# Patient Record
Sex: Male | Born: 1945 | Race: White | Hispanic: No | Smoking: Former smoker
Health system: Southern US, Community
[De-identification: ages and names within clinical notes are randomized; demographics above are authoritative.]

## PROBLEM LIST (undated history)

## (undated) DIAGNOSIS — N400 Enlarged prostate without lower urinary tract symptoms: Secondary | ICD-10-CM

## (undated) DIAGNOSIS — I209 Angina pectoris, unspecified: Secondary | ICD-10-CM

## (undated) DIAGNOSIS — E119 Type 2 diabetes mellitus without complications: Secondary | ICD-10-CM

## (undated) DIAGNOSIS — K219 Gastro-esophageal reflux disease without esophagitis: Secondary | ICD-10-CM

## (undated) DIAGNOSIS — K589 Irritable bowel syndrome without diarrhea: Secondary | ICD-10-CM

## (undated) DIAGNOSIS — I10 Essential (primary) hypertension: Secondary | ICD-10-CM

## (undated) DIAGNOSIS — I2510 Atherosclerotic heart disease of native coronary artery without angina pectoris: Secondary | ICD-10-CM

## (undated) DIAGNOSIS — G4733 Obstructive sleep apnea (adult) (pediatric): Secondary | ICD-10-CM

## (undated) DIAGNOSIS — R739 Hyperglycemia, unspecified: Secondary | ICD-10-CM

## (undated) DIAGNOSIS — J8410 Pulmonary fibrosis, unspecified: Secondary | ICD-10-CM

## (undated) DIAGNOSIS — F419 Anxiety disorder, unspecified: Secondary | ICD-10-CM

## (undated) DIAGNOSIS — E785 Hyperlipidemia, unspecified: Secondary | ICD-10-CM

## (undated) DIAGNOSIS — N529 Male erectile dysfunction, unspecified: Secondary | ICD-10-CM

## (undated) DIAGNOSIS — M1990 Unspecified osteoarthritis, unspecified site: Secondary | ICD-10-CM

## (undated) DIAGNOSIS — J849 Interstitial pulmonary disease, unspecified: Secondary | ICD-10-CM

## (undated) HISTORY — DX: Irritable bowel syndrome, unspecified: K58.9

## (undated) HISTORY — DX: Gastro-esophageal reflux disease without esophagitis: K21.9

## (undated) HISTORY — DX: Anxiety disorder, unspecified: F41.9

## (undated) HISTORY — DX: Pulmonary fibrosis, unspecified: J84.10

## (undated) HISTORY — DX: Interstitial pulmonary disease, unspecified: J84.9

## (undated) HISTORY — DX: Essential (primary) hypertension: I10

## (undated) HISTORY — PX: TONSILLECTOMY: SUR1361

## (undated) HISTORY — DX: Obstructive sleep apnea (adult) (pediatric): G47.33

## (undated) HISTORY — DX: Atherosclerotic heart disease of native coronary artery without angina pectoris: I25.10

## (undated) HISTORY — DX: Angina pectoris, unspecified: I20.9

## (undated) HISTORY — DX: Unspecified osteoarthritis, unspecified site: M19.90

## (undated) HISTORY — DX: Type 2 diabetes mellitus without complications: E11.9

## (undated) HISTORY — DX: Hyperlipidemia, unspecified: E78.5

## (undated) HISTORY — DX: Benign prostatic hyperplasia without lower urinary tract symptoms: N40.0

## (undated) HISTORY — PX: REPLACEMENT TOTAL KNEE BILATERAL: SUR1225

## (undated) HISTORY — DX: Male erectile dysfunction, unspecified: N52.9

## (undated) HISTORY — PX: COLON SURGERY: SHX602

## (undated) HISTORY — PX: APPENDECTOMY: SHX54

## (undated) HISTORY — DX: Hyperglycemia, unspecified: R73.9

---

## 1999-06-12 ENCOUNTER — Ambulatory Visit (HOSPITAL_COMMUNITY): Admission: RE | Admit: 1999-06-12 | Discharge: 1999-06-12 | Payer: Self-pay | Admitting: Internal Medicine

## 2004-11-22 HISTORY — PX: ILIAC ARTERY ANEURYSM REPAIR: SUR1158

## 2005-03-04 ENCOUNTER — Encounter: Admission: RE | Admit: 2005-03-04 | Discharge: 2005-03-04 | Payer: Self-pay | Admitting: Vascular Surgery

## 2005-03-10 ENCOUNTER — Ambulatory Visit (HOSPITAL_COMMUNITY): Admission: RE | Admit: 2005-03-10 | Discharge: 2005-03-10 | Payer: Self-pay | Admitting: Vascular Surgery

## 2005-03-16 ENCOUNTER — Inpatient Hospital Stay (HOSPITAL_COMMUNITY): Admission: RE | Admit: 2005-03-16 | Discharge: 2005-03-20 | Payer: Self-pay | Admitting: Vascular Surgery

## 2006-11-22 HISTORY — PX: REPLACEMENT TOTAL KNEE: SUR1224

## 2016-05-12 DIAGNOSIS — M5030 Other cervical disc degeneration, unspecified cervical region: Secondary | ICD-10-CM | POA: Insufficient documentation

## 2016-05-12 DIAGNOSIS — E782 Mixed hyperlipidemia: Secondary | ICD-10-CM | POA: Insufficient documentation

## 2016-05-12 DIAGNOSIS — K219 Gastro-esophageal reflux disease without esophagitis: Secondary | ICD-10-CM | POA: Insufficient documentation

## 2016-05-12 DIAGNOSIS — E1151 Type 2 diabetes mellitus with diabetic peripheral angiopathy without gangrene: Secondary | ICD-10-CM | POA: Insufficient documentation

## 2016-05-12 DIAGNOSIS — N433 Hydrocele, unspecified: Secondary | ICD-10-CM | POA: Insufficient documentation

## 2016-05-12 DIAGNOSIS — E1159 Type 2 diabetes mellitus with other circulatory complications: Secondary | ICD-10-CM | POA: Insufficient documentation

## 2016-05-12 DIAGNOSIS — E66811 Obesity, class 1: Secondary | ICD-10-CM | POA: Insufficient documentation

## 2016-05-12 DIAGNOSIS — K449 Diaphragmatic hernia without obstruction or gangrene: Secondary | ICD-10-CM | POA: Insufficient documentation

## 2016-05-12 DIAGNOSIS — M179 Osteoarthritis of knee, unspecified: Secondary | ICD-10-CM | POA: Insufficient documentation

## 2016-05-12 DIAGNOSIS — E119 Type 2 diabetes mellitus without complications: Secondary | ICD-10-CM | POA: Insufficient documentation

## 2016-05-12 DIAGNOSIS — I10 Essential (primary) hypertension: Secondary | ICD-10-CM | POA: Insufficient documentation

## 2016-05-12 DIAGNOSIS — E118 Type 2 diabetes mellitus with unspecified complications: Secondary | ICD-10-CM | POA: Insufficient documentation

## 2016-11-17 DIAGNOSIS — K580 Irritable bowel syndrome with diarrhea: Secondary | ICD-10-CM | POA: Insufficient documentation

## 2016-11-17 DIAGNOSIS — K130 Diseases of lips: Secondary | ICD-10-CM | POA: Insufficient documentation

## 2016-11-22 HISTORY — PX: REPLACEMENT TOTAL KNEE: SUR1224

## 2017-06-06 DIAGNOSIS — Z96652 Presence of left artificial knee joint: Secondary | ICD-10-CM | POA: Insufficient documentation

## 2017-10-17 DIAGNOSIS — N401 Enlarged prostate with lower urinary tract symptoms: Secondary | ICD-10-CM | POA: Insufficient documentation

## 2018-05-29 DIAGNOSIS — I739 Peripheral vascular disease, unspecified: Secondary | ICD-10-CM | POA: Insufficient documentation

## 2019-06-21 ENCOUNTER — Telehealth: Payer: Self-pay | Admitting: Cardiovascular Disease

## 2019-06-21 NOTE — Telephone Encounter (Signed)
The patient was referred to [place] by Dr. Bea Graff of [facility]. Dr. Willette Pa office was supposed to be faxing over a referral as well as the patient's charts. The patient has not heard anything about scheduling his first appointment with Dr. Sallyanne Kuster. He just wants to make sure his other doctor's office sent over the paperwork to [place].

## 2019-06-26 ENCOUNTER — Telehealth: Payer: Self-pay | Admitting: Cardiovascular Disease

## 2019-06-26 NOTE — Telephone Encounter (Signed)
LVM for patient to schedule a new patient appointment with Dr. Sallyanne Kuster on 06-29-19 at 2 p.m.

## 2019-06-29 ENCOUNTER — Other Ambulatory Visit: Payer: Self-pay

## 2019-06-29 ENCOUNTER — Ambulatory Visit (INDEPENDENT_AMBULATORY_CARE_PROVIDER_SITE_OTHER): Payer: Medicare Other | Admitting: Cardiovascular Disease

## 2019-06-29 ENCOUNTER — Encounter: Payer: Self-pay | Admitting: Cardiovascular Disease

## 2019-06-29 VITALS — BP 155/85 | HR 69 | Ht 70.0 in | Wt 212.0 lb

## 2019-06-29 DIAGNOSIS — I208 Other forms of angina pectoris: Secondary | ICD-10-CM | POA: Diagnosis not present

## 2019-06-29 DIAGNOSIS — I739 Peripheral vascular disease, unspecified: Secondary | ICD-10-CM

## 2019-06-29 DIAGNOSIS — I10 Essential (primary) hypertension: Secondary | ICD-10-CM

## 2019-06-29 DIAGNOSIS — E1151 Type 2 diabetes mellitus with diabetic peripheral angiopathy without gangrene: Secondary | ICD-10-CM | POA: Diagnosis not present

## 2019-06-29 DIAGNOSIS — E7800 Pure hypercholesterolemia, unspecified: Secondary | ICD-10-CM | POA: Diagnosis not present

## 2019-06-29 MED ORDER — ATENOLOL 50 MG PO TABS: 75.0000 mg | ORAL_TABLET | Freq: Every day | ORAL | 4 refills | Status: DC

## 2019-06-29 MED ORDER — SIMVASTATIN 40 MG PO TABS: 40.0000 mg | ORAL_TABLET | Freq: Every day | ORAL | 4 refills | Status: DC

## 2019-06-29 NOTE — Patient Instructions (Addendum)
Medication Instructions:  INCREASE the Atenolol 75 mg once daily INCREASE the Simvastatin to 40 mg once daily  If you need a refill on your cardiac medications before your next appointment, please call your pharmacy.   Lab work: Your provider would like for you to return one week before the schedule Cardiac CT to have the following labs drawn: BMET. You do not need an appointment for the lab. Once in our office lobby there is a podium where you can sign in and ring the doorbell to alert [place] that you are here. The lab is open from 8:00 am to 4:30 pm; closed for lunch from 12:45pm-1:45pm.  If you have labs (blood work) drawn today and your tests are completely normal, you will receive your results only by: Marland Kitchen MyChart Message (if you have MyChart) OR . A paper copy in the mail If you have any lab test that is abnormal or we need to change your treatment, we will call you to review the results.  Testing/Procedures: Your physician has requested that you have cardiac CT. Cardiac computed tomography (CT) is a painless test that uses an x-ray machine to take clear, detailed pictures of your heart. For further information please visit HugeFiesta.tn. Please follow instruction sheet as given.  Follow-Up: . Follow up after the cardiac ct  Any Other Special Instructions Will Be Listed Below (If Applicable). Your cardiac CT will be scheduled at one of the below locations:   [facility] [address] [place], [place] [address] (336) [place] [address] [place], [place] [address] [phone]  Please arrive at the [facility] main entrance of [facility] 30-45 minutes prior to test start time. Proceed to the [facility] Radiology Department (first floor) to check-in and test prep.  Please follow these instructions carefully (unless otherwise directed):  Hold all erectile dysfunction medications at least  48 hours prior to test.  On the Night Before the Test: . Be sure to Drink plenty of water. . Do not consume any caffeinated/decaffeinated beverages or chocolate 12 hours prior to your test. . Do not take any antihistamines 12 hours prior to your test.  On the Day of the Test: . Drink plenty of water. Do not drink any water within one hour of the test. . Do not eat any food 4 hours prior to the test. . You may take your regular medications prior to the test.  . Take your Atenolol two hours prior to test. . HOLD Hydrochlorothiazide morning of the test.        After the Test: . Drink plenty of water. . After receiving IV contrast, you may experience a mild flushed feeling. This is normal. . On occasion, you may experience a mild rash up to 24 hours after the test. This is not dangerous. If this occurs, you can take Benadryl 25 mg and increase your fluid intake. . If you experience trouble breathing, this can be serious. If it is severe call 911 IMMEDIATELY. If it is mild, please call our office.     Please contact the cardiac imaging nurse navigator should you have any questions/concerns Marchia Bond, RN Navigator Cardiac Imaging [place] and Vascular Services [phone] Office  [phone] Cell

## 2019-06-29 NOTE — Progress Notes (Signed)
Cardiology Consultation Note:    Date:  06/30/2019   ID:  Vincent Meyers, DOB Jul 04, 1946, MRN [id number]  PCP:  Vincent Mina., MD  Cardiologist:  No primary care provider on file.  Electrophysiologist:  None   Referring MD: Vincent Mina., MD   Chief Complaint  Patient presents with  . Chest Pain  Vincent Meyers is a 73 y.o. male who is being seen today for the evaluation of exertional chest pain at the request of Vincent Mina., MD.   History of Present Illness:    Vincent Meyers is a 73 y.o. male with a hx of PAD, DM, HLP, HTN who has developed problems with exertional chest pain gradually over the last year.    He brought this to the attention of his primary care provider, Vincent Meyers after he experienced recurrent problems of central chest tightness during sexual intercourse.  The symptoms are promptly resolved with rest and then return with resuming activity.  Looking back, he acknowledges that he has noticed some similar chest discomfort whenever he would try to walk uphill, as far back as the last year or so.  For some time, he thought this was due to a hiatal hernia, since he also has a similar chest tightness when he lies flat in bed at night, with mild symptoms that spontaneously resolve after about an hour.  He denies problems with dyspnea, palpitations, dizziness, syncope, intermittent claudication, but does have erectile dysfunction and has used PDE 5 inhibitors at times.  He was not using any erectile dysfunction medications when he has episodes of chest pain.  He has never taken nitroglycerin.  He does not have any history of focal neurological events.  She has been taking a beta-blocker for years for high blood pressure, but recently his blood pressure has been a little high.  He has a longstanding history of problems with PAD, although the history is not very clear.  He says he initially had some type of abdominal surgery that required removal of a segment of intestine and  that an abnormality with 1 of his pelvic arteries was detected at that time.  He subsequently underwent a procedure by Dr. Donnetta Hutching (stent?).  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  And had a repeat procedure, possibly a surgical bypass.  I cannot find any records other than a CT from 2006 that shows a thrombosed left external iliac artery.  "In comparison with [facility] pelvic CT 02/26/05 there is no interval change in aneurysmal dilatation of the left common iliac artery measuring 6.2cm AP x 5.2cm wide (image 65) with likely anterolateral extensive thrombosis and enhancing central patency posteriorly.  The left external iliac artery shows thrombosis.  The left common femoral artery is patent constituted by collateral vessels." [03/04/2005]  Past Medical History:  Diagnosis Date  . Diabetes mellitus without complication ([place])   . Erectile dysfunction   . Hyperlipidemia   . Hypertension     Past Surgical History:  Procedure Laterality Date  . ILIAC ARTERY ANEURYSM REPAIR Left 2006   Dr. Donnetta Hutching (redo surgery)  . REPLACEMENT TOTAL KNEE BILATERAL      Current Medications: Current Meds  Medication Sig  . Flaxseed, Linseed, (FLAXSEED OIL PO) Take by mouth.  . hydrochlorothiazide (HYDRODIURIL) 25 MG tablet TAKE 1 TABLET BY MOUTH DAILY  . omeprazole (PRILOSEC) 20 MG capsule Take by mouth.  Marland Kitchen  Probiotic Product (PROBIOTIC-10 PO) Take by mouth.  . sildenafil (REVATIO) 20 MG tablet Use up to 5 tablets one hour prior to sex.  Maximum one dosing per 24 hours.  . simvastatin (ZOCOR) 40 MG tablet Take 1 tablet (40 mg total) by mouth daily at 6 PM.  . tadalafil (CIALIS) 10 MG tablet Take 10 mg by mouth daily as needed for erectile dysfunction.  . Turmeric (QC TUMERIC COMPLEX PO) Take by mouth.  . [DISCONTINUED] simvastatin (ZOCOR) 20 MG tablet TK 1 T PO Q NIGHT     Allergies:   Patient  has no allergy information on record.   Social History   Socioeconomic History  . Marital status: Married    Spouse name: Not on file  . Number of children: Not on file  . Years of education: Not on file  . Highest education level: Not on file  Occupational History  . Not on file  Social Needs  . Financial resource strain: Not on file  . Food insecurity    Worry: Not on file    Inability: Not on file  . Transportation needs    Medical: Not on file    Non-medical: Not on file  Tobacco Use  . Smoking status: Former Research scientist (life sciences)  . Smokeless tobacco: Never Used  Substance and Sexual Activity  . Alcohol use: Yes    Alcohol/week: 2.0 standard drinks    Types: 2 Standard drinks or equivalent per week  . Drug use: Not on file  . Sexual activity: Not on file  Lifestyle  . Physical activity    Days per week: Not on file    Minutes per session: Not on file  . Stress: Not on file  Relationships  . Social Herbalist on phone: Not on file    Gets together: Not on file    Attends religious service: Not on file    Active member of club or organization: Not on file    Attends meetings of clubs or organizations: Not on file    Relationship status: Not on file  Other Topics Concern  . Not on file  Social History Narrative  . Not on file     Family History: The patient's family history includes Alzheimer's disease in his father; Heart attack in his father; Parkinson's disease in an other family member; Stroke in his father and another family member.  ROS:   Please see the history of present illness.     All other systems reviewed and are negative.  EKGs/Labs/Other Studies Reviewed:    The following studies were reviewed today: Notes from Vincent Meyers. CT of the abdomen and pelvis from 2006.  EKG:  EKG is ordered today.  The ekg ordered today demonstrates normal sinus rhythm, normal tracing  Recent Labs: May 31, 2019 Potassium 4.2, glucose 125, creatinine 0.6, normal  liver function tests, hemoglobin 15.9, platelets 207K, hemoglobin A1c 6.2% Recent Lipid Panel May 31, 2019 Total cholesterol 118, triglycerides 117, HDL 31, direct LDL 78  Physical Exam:    VS:  BP (!) 155/85   Pulse 69   Ht 5\' 10"  (1.778 m)   Wt 212 lb (96.2 kg)   SpO2 96%   BMI 30.42 kg/m     Wt Readings from Last 3 Encounters:  06/29/19 212 lb (96.2 kg)     GEN:  Well nourished, well developed in no acute distress HEENT: Normal NECK: No JVD; No carotid bruits LYMPHATICS: No lymphadenopathy CARDIAC: RRR, no murmurs,  rubs, gallops.  Normal radial and ulnar pulses, 1+ left pedal pulses, normal right pedal pulses RESPIRATORY:  Clear to auscultation without rales, wheezing or rhonchi  ABDOMEN: Soft, non-tender, non-distended MUSCULOSKELETAL:  No edema; No deformity  SKIN: Warm and dry NEUROLOGIC:  Alert and oriented x 3 PSYCHIATRIC:  Normal affect   ASSESSMENT:    1. Stable angina (Vincent Meyers)   2. Hypercholesterolemia   3. Diabetes mellitus with peripheral vascular disease (Vincent Meyers)   4. Essential hypertension   5. PAD (peripheral artery disease) ([facility])    PLAN:    In order of problems listed above:  1. CAD: He gives a textbook description of exertional angina pectoris.  We discussed the difference been stable and unstable coronary syndromes, when he should seek emergency attention.  Increase the atenolol to 75 mg once daily.  Schedule him for a coronary CT angiogram (do not take ACE inhibitor or thiazide diuretic on the day of the study).  Continue aspirin and statin. 2.  HLP: On the current dose of simvastatin his lipid profile is almost in target range (<70).  We will increase the simvastatin to 40 mg daily. 3. DM: Well-controlled with diet only. 4. HTN: Increase atenolol to 75 mg daily.  Consider adding a calcium channel blocker for its antianginal effects as well. 5.  PAD: The history of his peripheral vascular problems is not clear to me.  They seem to be quiescent since 2006.   I do not think he has had any follow-up since then.  If he does need a coronary angiogram we would plan to do this from a radial approach, but knowing the anatomy of his lower extremity arterial system might be important for future planning.   Medication Adjustments/Labs and Tests Ordered: Current medicines are reviewed at length with the patient today.  Concerns regarding medicines are outlined above.  Orders Placed This Encounter  Procedures  . CT CORONARY MORPH W/CTA COR W/SCORE W/CA W/CM &/OR WO/CM  . CT CORONARY FRACTIONAL FLOW RESERVE DATA PREP  . CT CORONARY FRACTIONAL FLOW RESERVE FLUID ANALYSIS  . Basic metabolic panel  . EKG 12-Lead   Meds ordered this encounter  Medications  . atenolol (TENORMIN) 50 MG tablet    Sig: Take 1.5 tablets (75 mg total) by mouth daily.    Dispense:  45 tablet    Refill:  4  . simvastatin (ZOCOR) 40 MG tablet    Sig: Take 1 tablet (40 mg total) by mouth daily at 6 PM.    Dispense:  30 tablet    Refill:  4    Patient Instructions  Medication Instructions:  INCREASE the Atenolol 75 mg once daily INCREASE the Simvastatin to 40 mg once daily  If you need a refill on your cardiac medications before your next appointment, please call your pharmacy.   Lab work: Your provider would like for you to return one week before the schedule Cardiac CT to have the following labs drawn: BMET. You do not need an appointment for the lab. Once in our office lobby there is a podium where you can sign in and ring the doorbell to alert [place] that you are here. The lab is open from 8:00 am to 4:30 pm; closed for lunch from 12:45pm-1:45pm.  If you have labs (blood work) drawn today and your tests are completely normal, you will receive your results only by: Marland Kitchen MyChart Message (if you have MyChart) OR . A paper copy in the mail If you have any lab test  that is abnormal or we need to change your treatment, we will call you to review the results.  Testing/Procedures:  Your physician has requested that you have cardiac CT. Cardiac computed tomography (CT) is a painless test that uses an x-ray machine to take clear, detailed pictures of your heart. For further information please visit HugeFiesta.tn. Please follow instruction sheet as given.  Follow-Up: . Follow up after the cardiac ct  Any Other Special Instructions Will Be Listed Below (If Applicable). Your cardiac CT will be scheduled at one of the below locations:   [facility] [address] [place], [place] [address] (336) Vincent [address] [place], [place] [address] [phone]  Please arrive at the [facility] main entrance of [facility] 30-45 minutes prior to test start time. Proceed to the [facility] Radiology Department (first floor) to check-in and test prep.  Please follow these instructions carefully (unless otherwise directed):  Hold all erectile dysfunction medications at least 48 hours prior to test.  On the Night Before the Test: . Be sure to Drink plenty of water. . Do not consume any caffeinated/decaffeinated beverages or chocolate 12 hours prior to your test. . Do not take any antihistamines 12 hours prior to your test.  On the Day of the Test: . Drink plenty of water. Do not drink any water within one hour of the test. . Do not eat any food 4 hours prior to the test. . You may take your regular medications prior to the test.  . Take your Atenolol two hours prior to test. . HOLD Hydrochlorothiazide morning of the test.        After the Test: . Drink plenty of water. . After receiving IV contrast, you may experience a mild flushed feeling. This is normal. . On occasion, you may experience a mild rash up to 24 hours after the test. This is not dangerous. If this occurs, you can take Benadryl 25 mg and increase your fluid intake. . If you experience trouble breathing, this  can be serious. If it is severe call 911 IMMEDIATELY. If it is mild, please call our office.     Please contact the cardiac imaging nurse navigator should you have any questions/concerns Marchia Bond, RN Navigator Cardiac [place] and Vascular Services [phone] Office  [phone] Cell      Signed, Sanda Klein, MD  06/30/2019 4:33 PM    [place]

## 2019-06-30 ENCOUNTER — Encounter: Payer: Self-pay | Admitting: Cardiovascular Disease

## 2019-07-16 LAB — BASIC METABOLIC PANEL
BUN/Creatinine Ratio: 16 (ref 10–24)
BUN: 10 mg/dL (ref 8–27)
CO2: 23 mmol/L (ref 20–29)
Calcium: 9 mg/dL (ref 8.6–10.2)
Chloride: 97 mmol/L (ref 96–106)
Creatinine, Ser: 0.62 mg/dL — ABNORMAL LOW (ref 0.76–1.27)
GFR calc Af Amer: 115 mL/min/{1.73_m2} (ref >59)
GFR calc non Af Amer: 100 mL/min/{1.73_m2} (ref >59)
Glucose: 190 mg/dL — ABNORMAL HIGH (ref 65–99)
Potassium: 4.7 mmol/L (ref 3.5–5.2)
Sodium: 137 mmol/L (ref 134–144)

## 2019-07-23 ENCOUNTER — Telehealth (HOSPITAL_COMMUNITY): Payer: Self-pay | Admitting: Emergency Medicine

## 2019-07-23 NOTE — Telephone Encounter (Signed)
Left message on voicemail with name and callback number Cashawn Yanko RN Navigator Cardiac Imaging [place] [facility] [phone] Office [phone] Cell  

## 2019-07-24 ENCOUNTER — Ambulatory Visit (HOSPITAL_COMMUNITY)
Admission: RE | Admit: 2019-07-24 | Discharge: 2019-07-24 | Disposition: A | Discharge status: Home / Self Care | Payer: Medicare Other | Source: Ambulatory Visit | Attending: Cardiovascular Disease | Admitting: Cardiovascular Disease

## 2019-07-24 ENCOUNTER — Other Ambulatory Visit: Payer: Self-pay

## 2019-07-24 DIAGNOSIS — I208 Other forms of angina pectoris: Secondary | ICD-10-CM

## 2019-07-24 MED ORDER — IOHEXOL 350 MG/ML SOLN
80.0000 mL | Freq: Once | INTRAVENOUS | Status: AC | PRN
  Administered 2019-07-24: 10:00:00 80 mL via INTRAVENOUS

## 2019-07-24 MED ORDER — NITROGLYCERIN 0.4 MG SL SUBL
SUBLINGUAL_TABLET | SUBLINGUAL | Status: AC
  Filled 2019-07-24: qty 2

## 2019-07-24 MED ORDER — METOPROLOL TARTRATE 5 MG/5ML IV SOLN
5.0000 mg | INTRAVENOUS | Status: DC | PRN
  Administered 2019-07-24 (×2): 5 mg via INTRAVENOUS
  Filled 2019-07-24 (×2): qty 5

## 2019-07-24 MED ORDER — NITROGLYCERIN 0.4 MG SL SUBL
0.8000 mg | SUBLINGUAL_TABLET | Freq: Once | SUBLINGUAL | Status: AC
  Administered 2019-07-24: 10:00:00 0.8 mg via SUBLINGUAL
  Filled 2019-07-24: qty 25

## 2019-07-24 MED ORDER — METOPROLOL TARTRATE 5 MG/5ML IV SOLN
INTRAVENOUS | Status: AC
  Filled 2019-07-24: qty 15

## 2019-07-25 DIAGNOSIS — I2510 Atherosclerotic heart disease of native coronary artery without angina pectoris: Secondary | ICD-10-CM | POA: Diagnosis not present

## 2019-07-26 ENCOUNTER — Telehealth: Payer: Self-pay | Admitting: Cardiovascular Disease

## 2019-07-26 ENCOUNTER — Encounter: Payer: Self-pay | Admitting: *Deleted

## 2019-07-26 NOTE — Telephone Encounter (Signed)
New message   Patient states that he is returning call for CT results. Please call.

## 2019-07-26 NOTE — Telephone Encounter (Signed)
Patient made aware of results and verbalized understanding.  His blood pressure on Monday was 130/76. He stated that it usually runs in the [id number] systolic.  The patient would like to know if he can resume strenuous activities. He had been previously advised to stop any strenuous activities.    Notes recorded by Sanda Klein, MD on 07/25/2019 at 5:15 PM EDT  The bad news on the CT of the coronaries is that there is very widespread atherosclerosis (more plaque than 97% of similar age and gender patients).  The good news is that there is only a couple of spots where this plaque is causing significant obstruction and this is limited to less important, secondary branches.  The only really severe blockage is and was called a diagonal branch, the secondary branch of the LAD artery that feeds the front while the heart. This does not feed a particularly large area of heart muscle and it could be hard to fix it with angioplasty and stenting due to the location of the blockage.  Ideally, will be able to control the angina symptoms with further adjustments in medication. I would only recommend angiography if we are unsuccessful with that approach.  Is he doing any better since we increased his atenolol?  Most importantly, it is critical that we prevent progression of the plaques in the other areas of his coronaries by aggressively lowering the cholesterol (we just increased his statin).

## 2019-07-26 NOTE — Telephone Encounter (Signed)
Left a message for the patient to call back.  

## 2019-07-26 NOTE — Telephone Encounter (Signed)
Has he felt better after we increased the atenolol? If so , yes, he can gradually increase activity back to previous baseline. Please make sure we made a f/u appt, preferably 3-4 months

## 2019-07-26 NOTE — Telephone Encounter (Signed)
The patient stated that he has felt better with the Atenolol.  He would like to know if it is safe to take the Cialis or Revatio. He has a prescription for both but knows he cannot take both. He would also like to know which one is better for him to take.

## 2019-07-26 NOTE — Telephone Encounter (Signed)
Revatio (sildenafil) lasts 8 hours. Cialis last 24 hours. Other than that they are pretty much the same as far as safety and side effects. Obviously, if side effects do occur, they will resolve quicker with sildenafil. For both medications it is critically important to avoid mixing them with nitroglycerin-type medications (would not recommend NTG within 24h of most recent dose of sildenafil or within 3 days of Cialis)

## 2019-07-26 NOTE — Telephone Encounter (Signed)
Please advise thank you

## 2019-07-26 NOTE — Telephone Encounter (Signed)
Message sent to MyChart with Dr. Victorino December reply, per the patient's request.

## 2019-11-19 ENCOUNTER — Other Ambulatory Visit: Payer: Self-pay

## 2019-11-19 MED ORDER — SIMVASTATIN 40 MG PO TABS: 40.0000 mg | ORAL_TABLET | Freq: Every day | ORAL | 0 refills | Status: DC

## 2020-01-06 ENCOUNTER — Ambulatory Visit: Payer: Medicare Other | Attending: Internal Medicine

## 2020-01-06 DIAGNOSIS — Z23 Encounter for immunization: Secondary | ICD-10-CM

## 2020-01-06 NOTE — Progress Notes (Signed)
   Covid-19 Vaccination Clinic  Name:  Vincent Meyers    MRN: [id number] DOB: 1946-03-20  01/06/2020  [name] was observed post Covid-19 immunization for 15 minutes without incidence. He was provided with Vaccine Information Sheet and instruction to access the V-Safe system.   [name] was instructed to call 911 with any severe reactions post vaccine: Marland Kitchen Difficulty breathing  . Swelling of your face and throat  . A fast heartbeat  . A bad rash all over your body  . Dizziness and weakness    Immunizations Administered    Name Date Dose VIS Date Route   Pfizer COVID-19 Vaccine 01/06/2020  2:46 PM 0.3 mL 11/02/2019 Intramuscular   Manufacturer: [place]   Lot: [id number]   [place]: [id number]

## 2020-01-28 ENCOUNTER — Other Ambulatory Visit: Payer: Self-pay

## 2020-01-29 ENCOUNTER — Ambulatory Visit: Payer: Medicare Other | Attending: Internal Medicine

## 2020-01-29 DIAGNOSIS — Z23 Encounter for immunization: Secondary | ICD-10-CM

## 2020-01-29 NOTE — Progress Notes (Signed)
   Covid-19 Vaccination Clinic  Name:  Vincent Meyers    MRN: [id number] DOB: 1946/06/13  01/29/2020  [name] was observed post Covid-19 immunization for 15 minutes without incident. He was provided with Vaccine Information Sheet and instruction to access the V-Safe system.   [name] was instructed to call 911 with any severe reactions post vaccine: Marland Kitchen Difficulty breathing  . Swelling of face and throat  . A fast heartbeat  . A bad rash all over body  . Dizziness and weakness   Immunizations Administered    Name Date Dose VIS Date Route   Pfizer COVID-19 Vaccine 01/29/2020  9:34 AM 0.3 mL 11/02/2019 Intramuscular   Manufacturer: [place]   Lot: [id number]   [place]: [id number]

## 2020-01-30 ENCOUNTER — Ambulatory Visit: Payer: Medicare Other

## 2020-01-31 ENCOUNTER — Other Ambulatory Visit: Payer: Self-pay

## 2020-01-31 MED ORDER — SIMVASTATIN 40 MG PO TABS: 40.0000 mg | ORAL_TABLET | Freq: Every day | ORAL | 0 refills | Status: DC

## 2020-05-05 ENCOUNTER — Other Ambulatory Visit: Payer: Self-pay | Admitting: *Deleted

## 2020-05-07 MED ORDER — SIMVASTATIN 40 MG PO TABS: 40.0000 mg | ORAL_TABLET | Freq: Every day | ORAL | 0 refills | Status: DC

## 2020-09-05 ENCOUNTER — Encounter (HOSPITAL_COMMUNITY): Payer: Self-pay

## 2020-09-05 ENCOUNTER — Ambulatory Visit (HOSPITAL_COMMUNITY)
Admission: EM | Admit: 2020-09-05 | Discharge: 2020-09-05 | Disposition: A | Discharge status: Home / Self Care | Payer: Medicare Other | Attending: Family Medicine | Admitting: Family Medicine

## 2020-09-05 ENCOUNTER — Other Ambulatory Visit: Payer: Self-pay

## 2020-09-05 DIAGNOSIS — I10 Essential (primary) hypertension: Secondary | ICD-10-CM

## 2020-09-05 MED ORDER — CLONIDINE HCL 0.1 MG PO TABS
ORAL_TABLET | ORAL | Status: AC
  Filled 2020-09-05: qty 1

## 2020-09-05 MED ORDER — CLONIDINE HCL 0.1 MG PO TABS
0.1000 mg | ORAL_TABLET | ORAL | Status: AC
  Administered 2020-09-05: 0.1 mg via ORAL

## 2020-09-05 MED ORDER — CLONIDINE HCL 0.1 MG PO TABS: 0.1000 mg | ORAL_TABLET | Freq: Two times a day (BID) | ORAL | 0 refills | Status: DC

## 2020-09-05 NOTE — ED Provider Notes (Signed)
[facility]    CSN: [id number] Arrival date & time: 09/05/20  1008      History   Chief Complaint Chief Complaint  Patient presents with  . Hypertension    this am 203/105    HPI Vincent Meyers is a 74 y.o. male.   HPI  Pleasant 73 year old gentleman.  He is here for hypertension.  He noticed that his blood pressure was very high when he checked it this morning.  It was 203/105 at home.  He checks his blood pressure periodically.  He states it has actually been higher than it should for the last couple of months.  He saw his physician who thought it might be due to stress.  They agreed to recheck it again when his stress from work and hobbies was lower. Patient was in [place] last week.  While he was in [place] he developed some symptoms and his blood pressure taken.  It was quite high.  He was admitted to the hospital.  He stayed overnight.  He states that he had blood work, EKG, chest x-ray, and some sort of chest dye study.  Everything was fine.  They gave him nitro to bring down his blood pressure.  He was discharged with no change in his overall medications. Patient states that he has fleeting head pains.  They last for just an instant.  Its in his forehead.  He states this happens periodically.  He cannot predict what will bring it on or improve the pain.  No nausea or vomiting.  No difficulty with vision or visual acuity.  No trouble with dexterity, or balance.  No numbness or weakness in arms or legs. Patient states that he has a family history of strokes.  He states that he worries a lot about having a stroke. Patient takes Tenormin 75 mg a day.  Also takes hydrochlorothiazide 25 mg a day, and lisinopril 40 mg a day.  He has been taking all of his medications regularly.   Past Medical History:  Diagnosis Date  . Diabetes mellitus without complication ([place])   . Erectile dysfunction   . Hyperlipidemia   . Hypertension     There are no problems to display  for this patient.   Past Surgical History:  Procedure Laterality Date  . ILIAC ARTERY ANEURYSM REPAIR Left 2006   Dr. Donnetta Hutching (redo surgery)  . REPLACEMENT TOTAL KNEE BILATERAL         Home Medications    Prior to Admission medications   Medication Sig Start Date End Date Taking? Authorizing Provider  aspirin EC 81 MG tablet Take by mouth.   Yes [provider]  atenolol (TENORMIN) 50 MG tablet Take 1.5 tablets (75 mg total) by mouth daily. 06/29/19  Yes Croitoru, Mihai, MD  Flaxseed, Linseed, (FLAXSEED OIL PO) Take by mouth.   Yes [provider]  hydrochlorothiazide (HYDRODIURIL) 25 MG tablet TAKE 1 TABLET BY MOUTH DAILY 11/11/16  Yes [provider]  lisinopril (ZESTRIL) 40 MG tablet TK 1 T PO D 05/28/19  Yes [provider]  Magnesium Gluconate 550 MG TABS Take by mouth.   Yes [provider]  Omega-3 1000 MG CAPS Take by mouth.   Yes [provider]  omeprazole (PRILOSEC) 20 MG capsule Take by mouth. 05/29/18  Yes [provider]  Probiotic Product (PROBIOTIC-10 PO) Take by mouth.   Yes [provider]  sildenafil (REVATIO) 20 MG tablet Use up to 5 tablets one hour prior to  sex.  Maximum one dosing per 24 hours. 05/31/19  Yes [provider]  simvastatin (ZOCOR) 40 MG tablet Take 1 tablet (40 mg total) by mouth daily at 6 PM. 05/07/20  Yes Croitoru, Mihai, MD  tadalafil (CIALIS) 10 MG tablet Take 10 mg by mouth daily as needed for erectile dysfunction.   Yes [provider]  Turmeric (QC TUMERIC COMPLEX PO) Take by mouth.   Yes [provider]  cloNIDine (CATAPRES) 0.1 MG tablet Take 1 tablet (0.1 mg total) by mouth 2 (two) times daily. 09/05/20   Raylene Everts, MD  Loratadine 10 MG CAPS Take by mouth.    [provider]    Family History Family History  Problem Relation Age of Onset  . Heart attack Father   . Stroke Father   . Alzheimer's disease Father   . Stroke Other    . Parkinson's disease Other     Social History Social History   Tobacco Use  . Smoking status: Former Research scientist (life sciences)  . Smokeless tobacco: Never Used  Vaping Use  . Vaping Use: Never used  Substance Use Topics  . Alcohol use: Yes    Alcohol/week: 2.0 standard drinks    Types: 2 Standard drinks or equivalent per week  . Drug use: Never     Allergies   Patient has no known allergies.   Review of Systems Review of Systems See HPI  Physical Exam Triage Vital Signs ED Triage Vitals  Enc Vitals Group     BP 09/05/20 1155 (!) 195/93     Pulse Rate 09/05/20 1155 69     Resp 09/05/20 1155 18     Temp 09/05/20 1155 98.2 F (36.8 C)     Temp Source 09/05/20 1155 Oral     SpO2 09/05/20 1155 98 %     Weight --      Height --      Head Circumference --      Peak Flow --      Pain Score 09/05/20 1158 0     Pain Loc --      Pain Edu? --      Excl. in [place]? --    No data found.  Updated Vital Signs BP (!) 196/78   Pulse 69   Temp 98.2 F (36.8 C) (Oral)   Resp 18   SpO2 98%      Physical Exam Constitutional:      General: He is not in acute distress.    Appearance: He is well-developed.     Comments: Rounded abdomen.  Mildly anxious.  HENT:     Head: Normocephalic and atraumatic.     Right Ear: Tympanic membrane and ear canal normal.     Left Ear: Tympanic membrane and ear canal normal.     Nose: Nose normal.     Mouth/Throat:     Mouth: Mucous membranes are moist.     Pharynx: No posterior oropharyngeal erythema.  Eyes:     Conjunctiva/sclera: Conjunctivae normal.     Pupils: Pupils are equal, round, and reactive to light.     Comments: Fundi normal or as expected  Cardiovascular:     Rate and Rhythm: Normal rate and regular rhythm.     Heart sounds: Normal heart sounds.  Pulmonary:     Effort: Pulmonary effort is normal. No respiratory distress.     Breath sounds: Normal breath sounds.     Comments: Heart and lung exam is normal Abdominal:  General:  There is no distension.     Palpations: Abdomen is soft.  Musculoskeletal:        General: Normal range of motion.     Cervical back: Normal range of motion.     Right lower leg: No edema.     Left lower leg: No edema.  Skin:    General: Skin is warm and dry.  Neurological:     Mental Status: He is alert.  Psychiatric:        Mood and Affect: Mood normal.        Behavior: Behavior normal.      UC Treatments / Results  Labs (all labs ordered are listed, but only abnormal results are displayed) Labs Reviewed - No data to display  EKG   Radiology No results found.  Procedures Procedures (including critical care time)  Medications Ordered in UC Medications  cloNIDine (CATAPRES) tablet 0.1 mg (0.1 mg Oral Given 09/05/20 1255)    Initial Impression / Assessment and Plan / UC Course  I have reviewed the triage vital signs and the nursing notes.  Pertinent labs & imaging results that were available during my care of the patient were reviewed by me and considered in my medical decision making (see chart for details).     I spoke with the patient about his hypertension.  He has elevated blood pressure but at this time it is not dangerous.  It is 188/94 by me.  I told him that this is not enough, in and of itself, to cause a stroke.  He is anxious about elevated blood pressure because of his family history.  I considered adding a long-acting nitrate, however do not want to do this with his history of taking Revatio.  He states he does have coronary artery disease but it is nonobstructive.  I told him to call his cardiologist, and his primary care doctor to be seen in the next couple of weeks.  I will add clonidine 0.1 twice a day. Final Clinical Impressions(s) / UC Diagnoses   Final diagnoses:  Primary hypertension     Discharge Instructions     Add clonidine twice a day Take your BP two times a day Follow up with your primary care doctor GO TO ER IF YOU THINK YOU ARE  HAVING STROKE SYMPTOMS   ED Prescriptions    Medication Sig Dispense Auth. Provider   cloNIDine (CATAPRES) 0.1 MG tablet Take 1 tablet (0.1 mg total) by mouth 2 (two) times daily. 60 tablet Raylene Everts, MD     PDMP not reviewed this encounter.   Raylene Everts, MD 09/05/20 [phone]

## 2020-09-05 NOTE — ED Triage Notes (Signed)
Pt states he had a BP of 203/105 this am. Pt has hx of hypertension and is on multiple medications that he states he takes as prescribed. Pt hospitalized for hypertension 2 weeks ago in [place]. Pt states since yesterday he has had a headache and sharp pain in his frontal area for a few seconds. Pt is aox4 and ambulatory.

## 2020-09-05 NOTE — Discharge Instructions (Signed)
Add clonidine twice a day Take your BP two times a day Follow up with your primary care doctor GO TO ER IF YOU THINK YOU ARE HAVING STROKE SYMPTOMS

## 2020-09-06 ENCOUNTER — Telehealth (HOSPITAL_COMMUNITY): Payer: Self-pay | Admitting: Emergency Medicine

## 2020-09-06 MED ORDER — CLONIDINE HCL 0.1 MG PO TABS
0.1000 mg | ORAL_TABLET | Freq: Two times a day (BID) | ORAL | 0 refills | Status: DC
Start: 2020-09-06 — End: 2020-09-16

## 2020-09-15 NOTE — Progress Notes (Signed)
Cardiology Office Note:    Date:  09/16/2020   ID:  Vincent Meyers, DOB September 05, 1946, MRN [id number]  PCP:  Vincent Meyers., MD  Cardiologist:  Sanda Klein, MD   Referring MD: Vincent Meyers., MD   Chief Complaint  Patient presents with  . Hospitalization Follow-up    HTN  hypertensive urgency  History of Present Illness:    Vincent Meyers is a 74 y.o. male with a hx of PAD, DM, HLD, and HTN who has recently established care with Dr. Sallyanne Kuster to evaluate exertional chest pain. Hx of PAD is unclear, but had thrombosed left external iliac artery in 2006. He was seen on 06/28/20 and subsequently underwent CT coronary 07/24/19. Overall, he has widespread atherosclerosis, 96th percentile.  He has 50-60% stenosis in the mid-distal RCA, > 70% in the PDA, mild 24-49% left main disease, 50-69% stenosis in the proximal to mid LAD and D1 with severe > 70% stenosis. Minimal plaque in the Cx. FFR was sent and was significant for PDA and ostial D1. Dr. Sallyanne Kuster reviewed the study and recommended medical therapy first. Atenolol was increased to 75 mg daily.   He went to the Urgent Care 09/05/20 with hypertensive urgency BP 203/105. He initially went to the UC for a COVID test because he felt weird. Pt reported staying overnight in the hospital in [place] the first week in October for hypertensive urgency treated with nitro, but no change to his baseline medications. 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  He describes EKG and CT scan and was told everything was normal while in [place]. In the UC here on 09/05/20, BP was 188/94 and was told he was not in danger of having a stroke. Long acting nitrate was considered but deferred due to revatio use. Clonidine 0.1 mg BID was added. There was some initial confusion about BID or SID dosing. He does not tolerate clonidine due to a flushed feeling 4-5 hrs after taking it and vision problems that generally resolve in 1  hr. He reduced clonidine to once nightly but continues to have elevated pressures.  He presents today for follow up. He is frustrated with medication changes. He denies chest pain and vision changes with elevated BP. At times, has had headaches that last seconds. BP at home is running 140-160s/70-90s. He denies chest pain, SOB, DOE, lower extremity swelling, palpitations, and syncope.     Past Medical History:  Diagnosis Date  . Diabetes mellitus without complication ([place])   . Erectile dysfunction   . Hyperlipidemia   . Hypertension     Past Surgical History:  Procedure Laterality Date  . ILIAC ARTERY ANEURYSM REPAIR Left 2006   Dr. Donnetta Hutching (redo surgery)  . REPLACEMENT TOTAL KNEE BILATERAL      Current Medications: Current Meds  Medication Sig  . aspirin EC 81 MG tablet Take by mouth.  Marland Kitchen atenolol (TENORMIN) 50 MG tablet Take 1.5 tablets (75 mg total) by mouth daily.  . Flaxseed, Linseed, (FLAXSEED OIL PO) Take by mouth.  . hydrochlorothiazide (HYDRODIURIL) 25 MG tablet TAKE 1 TABLET BY MOUTH DAILY  . lisinopril (ZESTRIL) 40 MG tablet TK 1 T PO D  . Loratadine 10 MG CAPS Take by mouth.  . Magnesium Gluconate 550 MG TABS Take by mouth.  . Omega-3 1000 MG CAPS Take by mouth.  Marland Kitchen omeprazole (PRILOSEC) 20 MG capsule Take by mouth.  . Probiotic Product (PROBIOTIC-10 PO) Take by mouth.  . simvastatin (  ZOCOR) 40 MG tablet Take 1 tablet (40 mg total) by mouth daily at 6 PM.  . Turmeric (QC TUMERIC COMPLEX PO) Take by mouth.  . [DISCONTINUED] cloNIDine (CATAPRES) 0.1 MG tablet Take 1 tablet (0.1 mg total) by mouth 2 (two) times daily.     Allergies:   Patient has no known allergies.   Social History   Socioeconomic History  . Marital status: Widowed    Spouse name: Not on file  . Number of children: Not on file  . Years of education: Not on file  . Highest education level: Not on file  Occupational History  . Not on file  Tobacco Use  . Smoking status: Former Research scientist (life sciences)  .  Smokeless tobacco: Never Used  Vaping Use  . Vaping Use: Never used  Substance and Sexual Activity  . Alcohol use: Yes    Alcohol/week: 2.0 standard drinks    Types: 2 Standard drinks or equivalent per week  . Drug use: Never  . Sexual activity: Yes  Other Topics Concern  . Not on file  Social History Narrative  . Not on file   Social Determinants of Health   Financial Resource Strain:   . Difficulty of Paying Living Expenses: Not on file  Food Insecurity:   . Worried About Charity fundraiser in the Last Year: Not on file  . Ran Out of Food in the Last Year: Not on file  Transportation Needs:   . Lack of Transportation (Medical): Not on file  . Lack of Transportation (Non-Medical): Not on file  Physical Activity:   . Days of Exercise per Week: Not on file  . Minutes of Exercise per Session: Not on file  Stress:   . Feeling of Stress : Not on file  Social Connections:   . Frequency of Communication with Friends and Family: Not on file  . Frequency of Social Gatherings with Friends and Family: Not on file  . Attends Religious Services: Not on file  . Active Member of Clubs or Organizations: Not on file  . Attends Archivist Meetings: Not on file  . Marital Status: Not on file     Family History: The patient's family history includes Alzheimer's disease in his father; Heart attack in his father; Parkinson's disease in an other family member; Stroke in his father and another family member.  ROS:   Please see the history of present illness.     All other systems reviewed and are negative.  EKGs/Labs/Other Studies Reviewed:    The following studies were reviewed today:  CT coronary 07/24/19: IMPRESSION: 1. Coronary calcium score of 2768. This was 88 percentile for age and sex matched control.  2. Normal coronary origin with right dominance.  3. Suspicion for a severe stenosis in the mid portion of a small PDA and ostial portion of a large 1. diagonal  artery. CAD-RADS 4 Severe stenosis. Additional analysis with CT FFR will be submitted.  EKG:  EKG is ordered today.  The ekg ordered today demonstrates sinus rhythm HR 72  Recent Labs: No results found for requested labs within last 8760 hours.  Recent Lipid Panel No results found for: CHOL, TRIG, HDL, CHOLHDL, VLDL, LDLCALC, LDLDIRECT  Physical Exam:    VS:  BP (!) 160/110   Pulse 72   Ht 5\' 8"  (1.727 m)   Wt 200 lb 12.8 oz (91.1 kg)   SpO2 96%   BMI 30.53 kg/m     Wt Readings from Last 3  Encounters:  09/16/20 200 lb 12.8 oz (91.1 kg)  06/29/19 212 lb (96.2 kg)     GEN: Well nourished, well developed in no acute distress HEENT: Normal NECK: No JVD; No carotid bruits LYMPHATICS: No lymphadenopathy CARDIAC: RRR, no murmurs, rubs, gallops RESPIRATORY:  Clear to auscultation without rales, wheezing or rhonchi  ABDOMEN: Soft, non-tender, non-distended MUSCULOSKELETAL:  No edema; No deformity  SKIN: Warm and dry NEUROLOGIC:  Alert and oriented x 3 PSYCHIATRIC:  Normal affect   ASSESSMENT:    1. Essential hypertension   2. Hypertensive urgency   3. Atherosclerosis of coronary artery of native heart without angina pectoris, unspecified vessel or lesion type   4. Hypercholesterolemia   5. Diabetes mellitus with peripheral vascular disease ([place])   6. PAD (peripheral artery disease) ([facility])    PLAN:    In order of problems listed above:  Hypertensive urgency Essential hypertension - continue atenolol, 40 mg lisinopril, HCTZ 25 mg, and 0.1 mg clonidine BID - pressures have been elevated in the 453M systolic while taking clonidine 0.1 mg nightly - given his side effects, I do not think clonidine is sustainable at this point - I will D/C clonidine and start 5 mg amlodipine - he will keep a BP log and I will see him back in 2 weeks - keep sodium below 2000 mg daily - discussed high sodium foods   Coronary atherosclerosis - atenolol 75 mg daily - doing well on this  dose - has not had to use SL nitro, although this is complicated by use of PDE4 inhibitors - continue ASA 81 mg - no chest pain even with elevated pressures   Hyperlipidemia with LDL goal < 70 - needs updated lipid profile - simvastatin was increased to 40 mg in Aug 2020   DM - per PCP   PAD - does not currently follow with [facility] - does not appear to have problems since 2006   Follow up in 2 weeks with me.    Medication Adjustments/Labs and Tests Ordered: Current medicines are reviewed at length with the patient today.  Concerns regarding medicines are outlined above.  No orders of the defined types were placed in this encounter.  No orders of the defined types were placed in this encounter.   Signed, Ledora Bottcher, PA  09/16/2020 8:50 AM    [facility]

## 2020-09-16 ENCOUNTER — Other Ambulatory Visit: Payer: Self-pay

## 2020-09-16 ENCOUNTER — Encounter: Payer: Self-pay | Admitting: Physician Assistant

## 2020-09-16 ENCOUNTER — Ambulatory Visit: Payer: Medicare Other | Admitting: Physician Assistant

## 2020-09-16 VITALS — BP 160/110 | HR 72 | Ht 68.0 in | Wt 200.8 lb

## 2020-09-16 DIAGNOSIS — I739 Peripheral vascular disease, unspecified: Secondary | ICD-10-CM

## 2020-09-16 DIAGNOSIS — I2510 Atherosclerotic heart disease of native coronary artery without angina pectoris: Secondary | ICD-10-CM

## 2020-09-16 DIAGNOSIS — I10 Essential (primary) hypertension: Secondary | ICD-10-CM | POA: Diagnosis not present

## 2020-09-16 DIAGNOSIS — E1151 Type 2 diabetes mellitus with diabetic peripheral angiopathy without gangrene: Secondary | ICD-10-CM

## 2020-09-16 DIAGNOSIS — E7800 Pure hypercholesterolemia, unspecified: Secondary | ICD-10-CM

## 2020-09-16 DIAGNOSIS — I160 Hypertensive urgency: Secondary | ICD-10-CM

## 2020-09-16 MED ORDER — AMLODIPINE BESYLATE 5 MG PO TABS: 5.0000 mg | ORAL_TABLET | Freq: Every day | ORAL | 2 refills | Status: DC

## 2020-09-16 NOTE — Progress Notes (Signed)
Agree - amlodipine is a much better choice than the clonidine. Thanks

## 2020-09-16 NOTE — Patient Instructions (Signed)
Medication Instructions:  Stop Clonidine, Start Amlodipine 5mg  (Take 1 Tablet Daily) *If you need a refill on your cardiac medications before your next appointment, please call your pharmacy*   Lab Work: No labs If you have labs (blood work) drawn today and your tests are completely normal, you will receive your results only by: Marland Kitchen MyChart Message (if you have MyChart) OR . A paper copy in the mail If you have any lab test that is abnormal or we need to change your treatment, we will call you to review the results.   Testing/Procedures: No Testing   Follow-Up: At [facility], you and your health needs are our priority.  As part of our continuing mission to provide you with exceptional heart care, we have created designated Provider Care Teams.  These Care Teams include your primary Cardiologist (physician) and Advanced Practice Providers (APPs -  Physician Assistants and Nurse Practitioners) who all work together to provide you with the care you need, when you need it.   Your next appointment:   2 week(s)  The format for your next appointment:   In Person  Provider:   Fabian Sharp PA-C  Other Instructions BP Log . Check Blood Pressure 2 hrs. After taking medication.

## 2020-09-24 ENCOUNTER — Telehealth: Payer: Self-pay | Admitting: General Practice

## 2020-09-24 NOTE — Telephone Encounter (Signed)
Left message for patient to call and discuss scheduling the Pulmonary Consult ordered by Coletta Memos, NP

## 2020-09-25 NOTE — Telephone Encounter (Signed)
Spoke with patient regarding pulmonary consult ordered by Fabian Sharp, PA ---patient requested Dr. Annamaria Boots but Dr. Annamaria Boots is not taking any new pulmonary patients---patient is scheduled for Friday 09/26/20 at 10:30 am with Dr. Halford Chessman.  Patient states he know the location of the office.

## 2020-09-26 ENCOUNTER — Other Ambulatory Visit: Payer: Self-pay

## 2020-09-26 ENCOUNTER — Ambulatory Visit: Payer: Medicare Other | Admitting: Pulmonary Disease

## 2020-09-26 ENCOUNTER — Encounter: Payer: Self-pay | Admitting: Pulmonary Disease

## 2020-09-26 VITALS — BP 130/72 | HR 62 | Temp 97.6°F | Ht 68.0 in | Wt 218.0 lb

## 2020-09-26 DIAGNOSIS — J849 Interstitial pulmonary disease, unspecified: Secondary | ICD-10-CM | POA: Diagnosis not present

## 2020-09-26 DIAGNOSIS — R0683 Snoring: Secondary | ICD-10-CM | POA: Diagnosis not present

## 2020-09-26 NOTE — Progress Notes (Signed)
Pulmonary, Critical Care, and Sleep Medicine  Chief Complaint  Patient presents with  . Consult    Referred by cardiology for possible pulmonary fibrosis. Was told by PCP that recent CXRs have showed scarring in lungs. Coughing up phlegm.     Constitutional:  BP 130/72 (BP Location: Right Arm, Patient Position: Sitting, Cuff Size: Large)   Pulse 62   Temp 97.6 F (36.4 C) (Temporal)   Ht 5\' 8"  (1.727 m)   Wt 218 lb (98.9 kg)   SpO2 97% Comment: on room air  BMI 33.15 kg/m   Past Medical History:  DM type 2, ED, HLD, HTN  Past Surgical History:  His  has a past surgical history that includes Iliac artery aneurysm repair (Left, 2006) and Replacement total knee bilateral.  Brief Summary:  Vincent Meyers is a 74 y.o. male former smoker with cough and dyspnea.       Subjective:   He smoked about 2 packs per day, and quit about 20 years ago.  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  He was told years ago that he had scarring in his lungs.  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  He was in hospital in [place] last month for HTN emergency.  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  He has cough with clear sputum.  Keeps up with activity okay.  Denies trouble swallowing, skin rash, or joint swelling.  No history of CTD or thromboembolic disease.  Denies history of pneumonia or TB.  No family history of lung disease.  He is from [facility].  Never in the [employer]  No animal/bird exposures.    He has trouble with his sleep.  He snores and has trouble breathing at night.  His girlfriend has to sleep in a different room because of his snoring.  He is tired during the day.  Epworth score is 12 out of 24.  Labss from 06/02/20: WBC 11, Hb 15.4, PLT 185, Na 137, K 4.2, CO2 32, Creatinine 0.62, Ca 9.1, Bili 0.5, ALP 68, AST 20, ALT 17.  Physical Exam:   Appearance - well kempt   ENMT - no sinus  tenderness, no oral exudate, no LAN, Mallampati 3 airway, no stridor  Respiratory - equal breath sounds bilaterally, basilar crackles b/l, no wheeze  CV - s1s2 regular rate and rhythm, no murmurs  Ext - no clubbing, no edema  Skin - no rashes  Psych - normal mood and affect   Pulmonary testing:    Chest Imaging:   Cardiac CT 07/25/19 >> mild fibrotic changes at bases Rt > Lt  Sleep Tests:    Cardiac Tests:    Social History:  He  reports that he has quit smoking. He has never used smokeless tobacco. He reports current alcohol use of about 2.0 standard drinks of alcohol per week. He reports that he does not use drugs.  Family History:  His family history includes Alzheimer's disease in his father; Heart attack in his father; Parkinson's disease in an other family member; Stroke in his father and another family member.     Assessment/Plan:   Chronic cough with concern for interstitial lung disease and obstructive lung disease. - he has extensive history of smoking and exposure to welding fumes - will get copy of his CT chest and Echocardiogram from hospital in [place] - will arrange for pulmonary  function test and have him complete ILD packet - based on above test results will determine what additional testing is needed  Snoring. - he has snoring, sleep disruption, apnea, and daytime sleepiness - had had increased CO2 on BMET from 06/02/20 - he has history of refractory hypertension and CAD - I am concerned he could have obstructive sleep apnea - will arrange for home sleep study to further assess  Time Spent Involved in Patient Care on Day of Examination:  46 minutes  Follow up:  Patient Instructions  Will have your sign a release form to get copies of your CT chest and Echocardiogram from Hospital in [place]  Will have you complete interstitial lung disease form and bring this with you to your next appointment with [place]  Will arrange for pulmonary function test  and home sleep study  Follow up in 6 weeks   Medication List:   Allergies as of 09/26/2020   No Known Allergies     Medication List       Accurate as of September 26, 2020 11:17 AM. If you have any questions, ask your nurse or doctor.        amLODipine 5 MG tablet Commonly known as: NORVASC Take 1 tablet (5 mg total) by mouth daily.   aspirin EC 81 MG tablet Take by mouth.   atenolol 50 MG tablet Commonly known as: TENORMIN Take 1.5 tablets (75 mg total) by mouth daily.   FLAXSEED OIL PO Take by mouth.   hydrochlorothiazide 25 MG tablet Commonly known as: HYDRODIURIL TAKE 1 TABLET BY MOUTH DAILY   lisinopril 40 MG tablet Commonly known as: ZESTRIL TK 1 T PO D   Loratadine 10 MG Caps Take by mouth.   Magnesium Gluconate 550 MG Tabs Take by mouth.   Omega-3 1000 MG Caps Take by mouth.   omeprazole 20 MG capsule Commonly known as: PRILOSEC Take by mouth.   PROBIOTIC-10 PO Take by mouth.   QC TUMERIC COMPLEX PO Take by mouth.   simvastatin 40 MG tablet Commonly known as: ZOCOR Take 1 tablet (40 mg total) by mouth daily at 6 PM.       Signature:  Chesley Mires, MD [place] Pager - [phone] 09/26/2020, 11:17 AM

## 2020-09-26 NOTE — Patient Instructions (Signed)
Will have your sign a release form to get copies of your CT chest and Echocardiogram from Hospital in [place]  Will have you complete interstitial lung disease form and bring this with you to your next appointment with [place]  Will arrange for pulmonary function test and home sleep study  Follow up in 6 weeks

## 2020-10-08 ENCOUNTER — Ambulatory Visit: Payer: Medicare Other | Admitting: General Practice

## 2020-10-24 ENCOUNTER — Other Ambulatory Visit: Payer: Self-pay

## 2020-10-24 ENCOUNTER — Ambulatory Visit: Payer: Medicare Other

## 2020-10-24 DIAGNOSIS — G4733 Obstructive sleep apnea (adult) (pediatric): Secondary | ICD-10-CM | POA: Diagnosis not present

## 2020-10-24 DIAGNOSIS — R0683 Snoring: Secondary | ICD-10-CM

## 2020-10-31 ENCOUNTER — Telehealth: Payer: Self-pay | Admitting: Pulmonary Disease

## 2020-10-31 DIAGNOSIS — G4733 Obstructive sleep apnea (adult) (pediatric): Secondary | ICD-10-CM | POA: Diagnosis not present

## 2020-10-31 NOTE — Telephone Encounter (Signed)
HST 10/25/20 >> AHI 23.4, SpO2 low 78%   Please inform him that his sleep study shows moderate obstructive sleep apnea.  Please arrange for ROV with me or NP to discuss treatment options.

## 2020-10-31 NOTE — Telephone Encounter (Signed)
Called and left message on voicemail to please return phone call. Contact number provided. 

## 2020-11-03 NOTE — Telephone Encounter (Addendum)
Letter printed and sent to patient home address due to being unable to reach patient by phone x2 to go over HST results.

## 2020-11-03 NOTE — Telephone Encounter (Signed)
Called and left message on voicemail to please return phone call to go over results. Contact number provided. 

## 2020-11-04 ENCOUNTER — Ambulatory Visit: Payer: Medicare Other | Admitting: General Practice

## 2020-11-04 ENCOUNTER — Telehealth: Payer: Self-pay | Admitting: Pulmonary Disease

## 2020-11-04 NOTE — Telephone Encounter (Signed)
lmtcb for pt.   Per Dr. Halford Chessman from 12.10.21 phone note: HST 10/25/20 >> AHI 23.4, SpO2 low 78%   Please inform him that his sleep study shows moderate obstructive sleep apnea.  Please arrange for ROV with me or NP to discuss treatment options.

## 2020-11-07 ENCOUNTER — Ambulatory Visit: Payer: Medicare Other | Admitting: Adult Health

## 2020-11-07 NOTE — Telephone Encounter (Signed)
lmtcb for pt.  

## 2020-11-10 ENCOUNTER — Encounter: Payer: Self-pay | Admitting: Emergency Medicine

## 2020-11-10 NOTE — Telephone Encounter (Signed)
Letter printed and placed in outgoing mail.  Nothing further needed at this time- will close encounter.

## 2020-11-10 NOTE — Telephone Encounter (Signed)
Letter generated for pt as we have tried numerous times to reach pt but have been unsuccessful.   Triage, please print and mail letter to pt. Encounter can be closed once complete. Thanks.

## 2020-11-11 ENCOUNTER — Ambulatory Visit: Payer: Medicare Other | Admitting: Adult Health

## 2020-11-17 ENCOUNTER — Other Ambulatory Visit: Payer: Self-pay

## 2020-11-17 ENCOUNTER — Ambulatory Visit (INDEPENDENT_AMBULATORY_CARE_PROVIDER_SITE_OTHER): Payer: Medicare Other | Admitting: Pulmonary Disease

## 2020-11-17 ENCOUNTER — Ambulatory Visit: Payer: Medicare Other | Admitting: Pulmonary Disease

## 2020-11-17 ENCOUNTER — Encounter: Payer: Self-pay | Admitting: Pulmonary Disease

## 2020-11-17 VITALS — BP 122/74 | HR 81 | Temp 97.7°F | Ht 68.0 in | Wt 218.0 lb

## 2020-11-17 DIAGNOSIS — J849 Interstitial pulmonary disease, unspecified: Secondary | ICD-10-CM

## 2020-11-17 DIAGNOSIS — G4733 Obstructive sleep apnea (adult) (pediatric): Secondary | ICD-10-CM | POA: Insufficient documentation

## 2020-11-17 LAB — PULMONARY FUNCTION TEST
DL/VA % pred: 88 %
DL/VA: 3.58 ml/min/mmHg/L
DLCO cor % pred: 67 %
DLCO cor: 16.02 ml/min/mmHg
DLCO unc % pred: 67 %
DLCO unc: 16.02 ml/min/mmHg
FEF 25-75 Post: 2.86 L/sec
FEF 25-75 Pre: 3.13 L/sec
FEF2575-%Change-Post: -8 %
FEF2575-%Pred-Post: 133 %
FEF2575-%Pred-Pre: 146 %
FEV1-%Change-Post: -1 %
FEV1-%Pred-Post: 93 %
FEV1-%Pred-Pre: 94 %
FEV1-Post: 2.69 L
FEV1-Pre: 2.73 L
FEV1FVC-%Change-Post: 2 %
FEV1FVC-%Pred-Pre: 114 %
FEV6-%Change-Post: -3 %
FEV6-%Pred-Post: 84 %
FEV6-%Pred-Pre: 87 %
FEV6-Post: 3.14 L
FEV6-Pre: 3.26 L
FEV6FVC-%Change-Post: 0 %
FEV6FVC-%Pred-Post: 105 %
FEV6FVC-%Pred-Pre: 106 %
FVC-%Change-Post: -3 %
FVC-%Pred-Post: 79 %
FVC-%Pred-Pre: 82 %
FVC-Post: 3.16 L
FVC-Pre: 3.28 L
Post FEV1/FVC ratio: 85 %
Post FEV6/FVC ratio: 99 %
Pre FEV1/FVC ratio: 83 %
Pre FEV6/FVC Ratio: 99 %
RV % pred: 47 %
RV: 1.14 L
TLC % pred: 65 %
TLC: 4.36 L

## 2020-11-17 NOTE — Patient Instructions (Addendum)
You were seen today by Coral Ceo, NP  for:   1. ILD (interstitial lung disease) (HCC)  We will provide the ILD questionnaire to Dr. Craige Cotta  Your breathing test today did show that you are having some difficulty taking in a full deep breath  If we cannot obtain the CT records or the echocardiogram records from the hospital in [place] then we may need to consider repeating these test  2. OSA (obstructive sleep apnea)  New CPAP Order  Mask of choice  DME of choice  APAP 5-15   We recommend that you start using your CPAP daily >>>Keep up the hard work using your device >>> Goal should be wearing this for the entire night that you are sleeping, at least 4 to 6 hours  Remember:  . Do not drive or operate heavy machinery if tired or drowsy.  . Please notify the supply company and office if you are unable to use your device regularly due to missing supplies or machine being broken.  . Work on maintaining a healthy weight and following your recommended nutrition plan  . Maintain proper daily exercise and movement  . Maintaining proper use of your device can also help improve management of other chronic illnesses such as: Blood pressure, blood sugars, and weight management.   BiPAP/ CPAP Cleaning:  >>>Clean weekly, with Dawn soap, and bottle brush.  Set up to air dry. >>> Wipe mask out daily with wet wipe or towelette    Follow Up:    Return in about 3 months (around 02/15/2021), or if symptoms worsen or fail to improve, for Follow up with Dr. Craige Cotta.   Notification of test results are managed in the following manner: If there are  any recommendations or changes to the  plan of care discussed in office today,  we will contact you and let you know what they are. If you do not hear from [place], then your results are normal and you can view them through your  MyChart account , or a letter will be sent to you. Thank you again for trusting [place] with your care  - Thank you, [facility]    It is flu season:   >>> Best ways to protect herself from the flu: Receive the yearly flu vaccine, practice good hand hygiene washing with soap and also using hand sanitizer when available, eat a nutritious meals, get adequate rest, hydrate appropriately       Please contact the office if your symptoms worsen or you have concerns that you are not improving.   Thank you for choosing [facility] for your healthcare, and for allowing [place] to partner with you on your healthcare journey. I am thankful to be able to provide care to you today.   Elisha Headland FNP-C    Sleep Apnea Sleep apnea affects breathing during sleep. It causes breathing to stop for a short time or to become shallow. It can also increase the risk of:  Heart attack.  Stroke.  Being very overweight (obese).  Diabetes.  Heart failure.  Irregular heartbeat. The goal of treatment is to help you breathe normally again. What are the causes? There are three kinds of sleep apnea:  Obstructive sleep apnea. This is caused by a blocked or collapsed airway.  Central sleep apnea. This happens when the brain does not send the right signals to the muscles that control breathing.  Mixed sleep apnea. This is a combination of obstructive and central sleep apnea.  The most common cause of this condition is a collapsed or blocked airway. This can happen if:  Your throat muscles are too relaxed.  Your tongue and tonsils are too large.  You are overweight.  Your airway is too small. What increases the risk?  Being overweight.  Smoking.  Having a small airway.  Being older.  Being male.  Drinking alcohol.  Taking medicines to calm yourself (sedatives or tranquilizers).  Having family members with the condition. What are the signs or symptoms?  Trouble staying asleep.  Being sleepy or tired during the day.  Getting angry a lot.  Loud snoring.  Headaches in the morning.  Not  being able to focus your mind (concentrate).  Forgetting things.  Less interest in sex.  Mood swings.  Personality changes.  Feelings of sadness (depression).  Waking up a lot during the night to pee (urinate).  Dry mouth.  Sore throat. How is this diagnosed?  Your medical history.  A physical exam.  A test that is done when you are sleeping (sleep study). The test is most often done in a sleep lab but may also be done at home. How is this treated?   Sleeping on your side.  Using a medicine to get rid of mucus in your nose (decongestant).  Avoiding the use of alcohol, medicines to help you relax, or certain pain medicines (narcotics).  Losing weight, if needed.  Changing your diet.  Not smoking.  Using a machine to open your airway while you sleep, such as: ? An oral appliance. This is a mouthpiece that shifts your lower jaw forward. ? A CPAP device. This device blows air through a mask when you breathe out (exhale). ? An EPAP device. This has valves that you put in each nostril. ? A BPAP device. This device blows air through a mask when you breathe in (inhale) and breathe out.  Having surgery if other treatments do not work. It is important to get treatment for sleep apnea. Without treatment, it can lead to:  High blood pressure.  Coronary artery disease.  In men, not being able to have an erection (impotence).  Reduced thinking ability. Follow these instructions at home: Lifestyle  Make changes that your doctor recommends.  Eat a healthy diet.  Lose weight if needed.  Avoid alcohol, medicines to help you relax, and some pain medicines.  Do not use any products that contain nicotine or tobacco, such as cigarettes, e-cigarettes, and chewing tobacco. If you need help quitting, ask your doctor. General instructions  Take over-the-counter and prescription medicines only as told by your doctor.  If you were given a machine to use while you sleep, use  it only as told by your doctor.  If you are having surgery, make sure to tell your doctor you have sleep apnea. You may need to bring your device with you.  Keep all follow-up visits as told by your doctor. This is important. Contact a doctor if:  The machine that you were given to use during sleep bothers you or does not seem to be working.  You do not get better.  You get worse. Get help right away if:  Your chest hurts.  You have trouble breathing in enough air.  You have an uncomfortable feeling in your back, arms, or stomach.  You have trouble talking.  One side of your body feels weak.  A part of your face is hanging down. These symptoms may be an emergency. Do not wait  to see if the symptoms will go away. Get medical help right away. Call your local emergency services (911 in the [place] Do not drive yourself to the hospital. Summary  This condition affects breathing during sleep.  The most common cause is a collapsed or blocked airway.  The goal of treatment is to help you breathe normally while you sleep. This information is not intended to replace advice given to you by your health care provider. Make sure you discuss any questions you have with your health care provider. Document Revised: 08/25/2018 Document Reviewed: 07/04/2018 Elsevier Patient Education  [place].  CPAP and BPAP Information CPAP and BPAP are methods of helping a person breathe with the use of air pressure. CPAP stands for "continuous positive airway pressure." BPAP stands for "bi-level positive airway pressure." In both methods, air is blown through your nose or mouth and into your air passages to help you breathe well. CPAP and BPAP use different amounts of pressure to blow air. With CPAP, the amount of pressure stays the same while you breathe in and out. With BPAP, the amount of pressure is increased when you breathe in (inhale) so that you can take larger breaths. Your health care  provider will recommend whether CPAP or BPAP would be more helpful for you. Why are CPAP and BPAP treatments used? CPAP or BPAP can be helpful if you have:  Sleep apnea.  Chronic obstructive pulmonary disease (COPD).  Heart failure.  Medical conditions that weaken the muscles of the chest including muscular dystrophy, or neurological diseases such as amyotrophic lateral sclerosis (ALS).  Other problems that cause breathing to be weak, abnormal, or difficult. CPAP is most commonly used for obstructive sleep apnea (OSA) to keep the airways from collapsing when the muscles relax during sleep. How is CPAP or BPAP administered? Both CPAP and BPAP are provided by a small machine with a flexible plastic tube that attaches to a plastic mask. You wear the mask. Air is blown through the mask into your nose or mouth. The amount of pressure that is used to blow the air can be adjusted on the machine. Your health care provider will determine the pressure setting that should be used based on your individual needs. When should CPAP or BPAP be used? In most cases, the mask only needs to be worn during sleep. Generally, the mask needs to be worn throughout the night and during any daytime naps. People with certain medical conditions may also need to wear the mask at other times when they are awake. Follow instructions from your health care provider about when to use the machine. What are some tips for using the mask?   Because the mask needs to be snug, some people feel trapped or closed-in (claustrophobic) when first using the mask. If you feel this way, you may need to get used to the mask. One way to do this is by holding the mask loosely over your nose or mouth and then gradually applying the mask more snugly. You can also gradually increase the amount of time that you use the mask.  Masks are available in various types and sizes. Some fit over your mouth and nose while others fit over just your nose. If  your mask does not fit well, talk with your health care provider about getting a different one.  If you are using a mask that fits over your nose and you tend to breathe through your mouth, a chin strap may be applied to  help keep your mouth closed.  The CPAP and BPAP machines have alarms that may sound if the mask comes off or develops a leak.  If you have trouble with the mask, it is very important that you talk with your health care provider about finding a way to make the mask easier to tolerate. Do not stop using the mask. Stopping the use of the mask could have a negative impact on your health. What are some tips for using the machine?  Place your CPAP or BPAP machine on a secure table or stand near an electrical outlet.  Know where the on/off switch is located on the machine.  Follow instructions from your health care provider about how to set the pressure on your machine and when you should use it.  Do not eat or drink while the CPAP or BPAP machine is on. Food or fluids could get pushed into your lungs by the pressure of the CPAP or BPAP.  Do not smoke. Tobacco smoke residue can damage the machine.  For home use, CPAP and BPAP machines can be rented or purchased through home health care companies. Many different brands of machines are available. Renting a machine before purchasing may help you find out which particular machine works well for you.  Keep the CPAP or BPAP machine and attachments clean. Ask your health care provider for specific instructions. Get help right away if:  You have redness or open areas around your nose or mouth where the mask fits.  You have trouble using the CPAP or BPAP machine.  You cannot tolerate wearing the CPAP or BPAP mask.  You have pain, discomfort, and bloating in your abdomen. Summary  CPAP and BPAP are methods of helping a person breathe with the use of air pressure.  Both CPAP and BPAP are provided by a small machine with a flexible  plastic tube that attaches to a plastic mask.  If you have trouble with the mask, it is very important that you talk with your health care provider about finding a way to make the mask easier to tolerate. This information is not intended to replace advice given to you by your health care provider. Make sure you discuss any questions you have with your health care provider. Document Revised: 02/28/2019 Document Reviewed: 09/27/2016 Elsevier Patient Education  [place].

## 2020-11-17 NOTE — Assessment & Plan Note (Signed)
Bibasilar crackles on physical exam today History of ILD based on a CT scan [place], I do not have these images, these have been requested ILD questionnaire provided today, will place in Dr. Juanetta Gosling look at folder Reviewed pulmonary function testing today with patient which did show mild restriction  Plan: Need to consider CT imaging if you are unable to obtain CT records as well as echocardiogram Work on increasing overall physical activity We will provide ILD questionnaire for Dr. Halford Chessman to further review 65-month follow-up

## 2020-11-17 NOTE — Progress Notes (Signed)
PFT done today. 

## 2020-11-17 NOTE — Progress Notes (Signed)
@Patient  ID: , male    DOB: Mar 21, 1946, 74 y.o.   MRN: [id number]  Chief Complaint  Patient presents with  . Follow-up    ILD, doing ok just coughing up mucous    Referring provider: [id number]., MD  HPI:  74 year old male former smoker followed in our office for ILD  PMH: Type 2 diabetes, hyperlipidemia, hypertension Smoker/ Smoking History: Former smoker.  Quit 1988.  40-pack-year smoking history. Maintenance:  None Pt of: Dr. 1989  11/17/2020  - Visit   74 year old male former smoker followed in our office for ILD.  Patient was initially consulted with Dr. 65 in November/2021.  Plan of care from that office visit was to obtain copies of the CT chest as well as echocardiogram from hospital in December/2021, complete ILD questionnaire, complete pulmonary function testing at home sleep study.  Home sleep study completed on 10/25/2020 showed an AHI of 23.4.  Patient presenting today after completing pulmonary function testing pulmonary function testing results are listed below:  11/17/2020-pulmonary function test-FVC 3.28 (82% predicted), postbronchodilator ratio 85, postbronchodilator FEV1 2.69 (93% addicted), TLC 4.36 (65% predicted), DLCO 16.02 (67% predicted)  Patient also brought in ILD questionnaire.  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    Tests:   HST 10/25/20 >> AHI 23.4, SpO2 low 78%  FENO:  No results found for: NITRICOXIDE  PFT: PFT Results Latest Ref Rng & Units 11/17/2020  FVC-Pre L 3.28  FVC-Predicted Pre % 82  FVC-Post L 3.16  FVC-Predicted Post % 79  Pre FEV1/FVC % % 83  Post FEV1/FCV % % 85  FEV1-Pre L 2.73  FEV1-Predicted Pre % 94  FEV1-Post L 2.69  DLCO uncorrected ml/min/mmHg 16.02  DLCO UNC% % 67  DLCO corrected ml/min/mmHg 16.02  DLCO COR %Predicted % 67  DLVA Predicted % 88  TLC L 4.36  TLC % Predicted % 65  RV %  Predicted % 47    WALK:  No flowsheet data found.  Imaging: No results found.  Lab Results:  CBC No results found for: WBC, RBC, HGB, HCT, PLT, MCV, MCH, MCHC, RDW, LYMPHSABS, MONOABS, EOSABS, BASOSABS  BMET    Component Value Date/Time   NA 137 07/16/2019 1120   K 4.7 07/16/2019 1120   CL 97 07/16/2019 1120   CO2 23 07/16/2019 1120   GLUCOSE 190 (H) 07/16/2019 1120   BUN 10 07/16/2019 1120   CREATININE 0.62 (L) 07/16/2019 1120   CALCIUM 9.0 07/16/2019 1120   GFRNONAA 100 07/16/2019 1120   GFRAA 115 07/16/2019 1120    BNP No results found for: BNP  ProBNP No results found for: PROBNP  Specialty Problems      Pulmonary Problems   ILD (interstitial lung disease) (HCC)   OSA (obstructive sleep apnea)      No Known Allergies  Immunization History  Administered Date(s) Administered  . Influenza, High Dose Seasonal PF 10/24/2019  . Influenza,inj,Quad PF,6-35 Mos 10/11/2016, 10/24/2018  . Influenza,inj,quad, With Preservative 09/19/2015  . Influenza,trivalent, recombinat, inj, PF 08/22/2017  . PFIZER SARS-COV-2 Vaccination 01/06/2020, 01/29/2020  . Pneumococcal Conjugate-13 05/19/2016  . Pneumococcal Polysaccharide-23 09/05/2012  . Tdap 03/12/2015  . Zoster 11/05/2013    Past Medical History:  Diagnosis Date  . Diabetes mellitus without complication (HCC)   . Erectile dysfunction   . Hyperlipidemia   . Hypertension     Tobacco History: Social History   Tobacco Use  Smoking Status  Former Smoker  Smokeless Tobacco Never Used   Counseling given: Not Answered   Continue to not smoke  Outpatient Encounter Medications as of 11/17/2020  Medication Sig  . amLODipine (NORVASC) 5 MG tablet Take 1 tablet (5 mg total) by mouth daily.  Marland Kitchen aspirin EC 81 MG tablet Take by mouth.  Marland Kitchen atenolol (TENORMIN) 50 MG tablet Take 1.5 tablets (75 mg total) by mouth daily.  . Flaxseed, Linseed, (FLAXSEED OIL PO) Take by mouth.  . hydrochlorothiazide (HYDRODIURIL) 25 MG  tablet TAKE 1 TABLET BY MOUTH DAILY  . lisinopril (ZESTRIL) 40 MG tablet TK 1 T PO D  . Loratadine 10 MG CAPS Take by mouth.  . Magnesium Gluconate 550 MG TABS Take by mouth.  . Omega-3 1000 MG CAPS Take by mouth.  Marland Kitchen omeprazole (PRILOSEC) 20 MG capsule Take by mouth.  . Probiotic Product (PROBIOTIC-10 PO) Take by mouth.  . simvastatin (ZOCOR) 40 MG tablet Take 1 tablet (40 mg total) by mouth daily at 6 PM.  . Turmeric (QC TUMERIC COMPLEX PO) Take by mouth.   No facility-administered encounter medications on file as of 11/17/2020.     Review of Systems  Review of Systems  Constitutional: Positive for fatigue. Negative for activity change, chills, fever and unexpected weight change.  HENT: Negative for postnasal drip, rhinorrhea, sinus pressure, sinus pain and sore throat.   Eyes: Negative.   Respiratory: Negative for cough, shortness of breath and wheezing.   Cardiovascular: Negative for chest pain and palpitations.  Gastrointestinal: Negative for constipation, diarrhea, nausea and vomiting.  Endocrine: Negative.   Genitourinary: Negative.   Musculoskeletal: Negative.   Skin: Negative.   Neurological: Negative for dizziness and headaches.  Psychiatric/Behavioral: Negative.  Negative for dysphoric mood. The patient is not nervous/anxious.   All other systems reviewed and are negative.    Physical Exam  BP 122/74 (BP Location: Left Arm, Cuff Size: Normal)   Pulse 81   Temp 97.7 F (36.5 C) (Oral)   Ht 5\' 8"  (1.727 m)   Wt 218 lb (98.9 kg)   SpO2 96%   BMI 33.15 kg/m   Wt Readings from Last 5 Encounters:  11/17/20 218 lb (98.9 kg)  09/26/20 218 lb (98.9 kg)  09/16/20 200 lb 12.8 oz (91.1 kg)  06/29/19 212 lb (96.2 kg)    BMI Readings from Last 5 Encounters:  11/17/20 33.15 kg/m  09/26/20 33.15 kg/m  09/16/20 30.53 kg/m  06/29/19 30.42 kg/m     Physical Exam Vitals and nursing note reviewed.  Constitutional:      General: He is not in acute distress.     Appearance: Normal appearance. He is normal weight.  HENT:     Head: Normocephalic and atraumatic.     Right Ear: Hearing, tympanic membrane, ear canal and external ear normal. There is no impacted cerumen.     Left Ear: Hearing, tympanic membrane, ear canal and external ear normal. There is no impacted cerumen.     Nose: Nose normal. No mucosal edema or rhinorrhea.     Right Turbinates: Not enlarged.     Left Turbinates: Not enlarged.     Mouth/Throat:     Mouth: Mucous membranes are dry.     Pharynx: Oropharynx is clear. No oropharyngeal exudate.  Eyes:     Pupils: Pupils are equal, round, and reactive to light.  Cardiovascular:     Rate and Rhythm: Normal rate and regular rhythm.     Pulses: Normal pulses.  Heart sounds: Normal heart sounds. No murmur heard.   Pulmonary:     Effort: Pulmonary effort is normal.     Breath sounds: Rales (bibasilar crackles) present. No decreased breath sounds or wheezing.  Musculoskeletal:     Cervical back: Normal range of motion.     Right lower leg: No edema.     Left lower leg: No edema.  Lymphadenopathy:     Cervical: No cervical adenopathy.  Skin:    General: Skin is warm and dry.     Capillary Refill: Capillary refill takes less than 2 seconds.     Findings: No erythema or rash.  Neurological:     General: No focal deficit present.     Mental Status: He is alert and oriented to person, place, and time.     Motor: No weakness.     Coordination: Coordination normal.     Gait: Gait is intact. Gait normal.  Psychiatric:        Mood and Affect: Mood normal.        Behavior: Behavior normal. Behavior is cooperative.        Thought Content: Thought content normal.        Judgment: Judgment normal.       Assessment & Plan:   ILD (interstitial lung disease) (HCC) Bibasilar crackles on physical exam today History of ILD based on a CT scan [place], I do not have these images, these have been requested ILD questionnaire provided  today, will place in Dr. Evlyn Courier look at folder Reviewed pulmonary function testing today with patient which did show mild restriction  Plan: Need to consider CT imaging if you are unable to obtain CT records as well as echocardiogram Work on increasing overall physical activity We will provide ILD questionnaire for Dr. Craige Cotta to further review 55-month follow-up  OSA (obstructive sleep apnea) Moderate obstructive sleep apnea seen at home sleep study  Plan: We will start CPAP therapy APAP setting 5-15 Mask of choice DME of choice Follow-up in 3 months    Return in about 3 months (around 02/15/2021), or if symptoms worsen or fail to improve, for Follow up with Dr. Craige Cotta.   Coral Ceo, NP 11/17/2020   This appointment required 32 minutes of patient care (this includes precharting, chart review, review of results, face-to-face care, etc.).

## 2020-11-17 NOTE — Assessment & Plan Note (Signed)
Moderate obstructive sleep apnea seen at home sleep study  Plan: We will start CPAP therapy APAP setting 5-15 Mask of choice DME of choice Follow-up in 3 months

## 2020-11-17 NOTE — Addendum Note (Signed)
Addended by: Sandra Cockayne on: 11/17/2020 05:33 PM   Modules accepted: Orders

## 2020-11-24 NOTE — Progress Notes (Signed)
Reviewed and agree with assessment/plan.   Verena Shawgo, MD [facility] 11/24/2020, 8:11 AM Pager:  [phone]  

## 2020-11-25 NOTE — Progress Notes (Unsigned)
Cardiology Clinic Note   Patient Name: Vincent Meyers Date of Encounter: 11/26/2020  Primary Care Provider:  Raina Mina., MD Primary Cardiologist:  Sanda Klein, MD  Patient Profile    Vincent Meyers 75 year old male presents the clinic today for follow-up evaluation of his hypertension.  Past Medical History    Past Medical History:  Diagnosis Date  . Diabetes mellitus without complication ([place])   . Erectile dysfunction   . Hyperlipidemia   . Hypertension    Past Surgical History:  Procedure Laterality Date  . ILIAC ARTERY ANEURYSM REPAIR Left 2006   Dr. Donnetta Hutching (redo surgery)  . REPLACEMENT TOTAL KNEE BILATERAL      Allergies  No Known Allergies  History of Present Illness    Vincent Meyers has a PMH of peripheral arterial disease, diabetes mellitus, hyperlipidemia, and hypertension.  He was seen and evaluated by Dr. Sallyanne Kuster for exertional chest pain.  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  He was seen on June 28, 2020 and underwent coronary CTA August 22, 2020.  It showed widespread atherosclerosis and placed him in the 96 percentile.  He was noted to have a 50-60% mid-distal RCA, greater than 70% PDA, 24-49% left main, 50-69% proximal-mid LAD and D1 with severe greater than 75% stenosis.  FFR showed significant PDA and ostial D1.  Dr. Sallyanne Kuster reviewed the study and recommended medical therapy.  At that time his metoprolol was increased to 75 mg daily.  He presented to urgent care 10/21 with hypertensive urgency.  His blood pressure at that time was 203/105.  He initially had gone to the urgent care for Covid test due to feeling weird.  He reported that he stayed overnight hospital the first week of October due to hypertensive urgency and was treated with nitroglycerin.  No changes were made to his baseline medications.  He was told at that time that he had pulmonary fibrosis and requested a  referral to pulmonology.  He described an EKG and CT scan and was told everything was normal while he was in [place].  In the urgent care on 09/05/2020 his blood pressure was 188/94 and he was told he was in no danger of having a CVA.  A long-acting nitrate was considered but deferred due to his modality of use.  Clonidine 0.1 mg twice daily was added to his medication regimen.  He expressed some confusion about twice daily dosing.  He did not tolerate clonidine due to a flushing feeling that would last 4-5 hours after taking and alterations in his vision.  He reduced his clonidine to once nightly but continued to have elevated blood pressures.  He was seen in the clinic on 09/16/2020 by Vincent Sharp PA-C.  He expressed frustration with medication changes.  He denied chest pain and visual changes with elevated blood pressure.  He did report headaches that would last for seconds.  He has blood pressures at home 140s-160s over 70s-90s.  He denied shortness of breath, lower extremity edema palpitations and syncope.  He was educated about eating a low-sodium diet.  His clonidine was discontinued and started on 5 mg of amlodipine.  He was instructed to keep a blood pressure log and follow-up was planned for 2 weeks.  He presents the clinic today for follow-up evaluation states he feels well today.  His blood pressure has been much better controlled with the addition of amlodipine.  He has been monitoring  his blood pressures and reports systolics in the [id number] with diastolics in 0000000 and [id number].  He has been very strict with his diet and reports he consistently stays under 2000 mg of sodium.  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  I will continue his current medications, give him a salty 6 diet sheet, increase physical activity as tolerated, and have him follow-up in 6  months.  Today he denies chest pain, shortness of breath, lower extremity edema, fatigue, palpitations, melena, hematuria, hemoptysis, diaphoresis, weakness, presyncope, syncope, orthopnea, and PND.   Home Medications    Prior to Admission medications   Medication Sig Start Date End Date Taking? Authorizing Provider  amLODipine (NORVASC) 5 MG tablet Take 1 tablet (5 mg total) by mouth daily. 09/16/20 12/15/20  Ledora Bottcher, PA  aspirin EC 81 MG tablet Take by mouth.    [provider]  atenolol (TENORMIN) 50 MG tablet Take 1.5 tablets (75 mg total) by mouth daily. 06/29/19   Croitoru, Mihai, MD  Flaxseed, Linseed, (FLAXSEED OIL PO) Take by mouth.    [provider]  hydrochlorothiazide (HYDRODIURIL) 25 MG tablet TAKE 1 TABLET BY MOUTH DAILY 11/11/16   [provider]  lisinopril (ZESTRIL) 40 MG tablet TK 1 T PO D 05/28/19   [provider]  Loratadine 10 MG CAPS Take by mouth.    [provider]  Magnesium Gluconate 550 MG TABS Take by mouth.    [provider]  Omega-3 1000 MG CAPS Take by mouth.    [provider]  omeprazole (PRILOSEC) 20 MG capsule Take by mouth. 05/29/18   [provider]  Probiotic Product (PROBIOTIC-10 PO) Take by mouth.    [provider]  simvastatin (ZOCOR) 40 MG tablet Take 1 tablet (40 mg total) by mouth daily at 6 PM. 05/07/20   Croitoru, Dani Gobble, MD  Turmeric (QC TUMERIC COMPLEX PO) Take by mouth.    [provider]    Family History    Family History  Problem Relation Age of Onset  . Heart attack Father   . Stroke Father   . Alzheimer's disease Father   . Stroke Other   . Parkinson's disease Other    He indicated that the status of his father is unknown. He indicated that the status of his other is unknown.  Social History    Social History   Socioeconomic History  . Marital status: Widowed    Spouse name: Not on file  . Number of children: Not on file  .  Years of education: Not on file  . Highest education level: Not on file  Occupational History  . Not on file  Tobacco Use  . Smoking status: Former Research scientist (life sciences)  . Smokeless tobacco: Never Used  Vaping Use  . Vaping Use: Never used  Substance and Sexual Activity  . Alcohol use: Yes    Alcohol/week: 2.0 standard drinks    Types: 2 Standard drinks or equivalent per week  . Drug use: Never  . Sexual activity: Yes  Other Topics Concern  . Not on file  Social History Narrative  . Not on file   Social Determinants of Health   Financial Resource Strain: Not on file  Food Insecurity: Not on file  Transportation Needs: Not on file  Physical Activity: Not on file  Stress: Not on file  Social Connections: Not on file  Intimate Partner Violence: Not on file     Review of Systems    General:  No chills, fever, night sweats or weight changes.  Cardiovascular:  No chest pain, dyspnea on exertion, edema, orthopnea, palpitations, paroxysmal nocturnal dyspnea. Dermatological: No rash, lesions/masses Respiratory: No cough, dyspnea Urologic: No hematuria, dysuria Abdominal:   No nausea, vomiting, diarrhea, bright red blood per rectum, melena, or hematemesis Neurologic:  No visual changes, wkns, changes in mental status. All other systems reviewed and are otherwise negative except as noted above.  Physical Exam    VS:  BP (!) 142/76   Pulse 67   Ht 5\' 8"  (1.727 m)   Wt 218 lb (98.9 kg)   SpO2 95%   BMI 33.15 kg/m  , BMI Body mass index is 33.15 kg/m. GEN: Well nourished, well developed, in no acute distress. HEENT: normal. Neck: Supple, no JVD, carotid bruits, or masses. Cardiac: RRR, no murmurs, rubs, or gallops. No clubbing, cyanosis, edema.  Radials/DP/PT 2+ and equal bilaterally.  Respiratory:  Respirations regular and unlabored, clear to auscultation bilaterally. GI: Soft, nontender, nondistended, BS + x 4. MS: no deformity or atrophy. Skin: warm and dry, no rash. Neuro:   Strength and sensation are intact. Psych: Normal affect.  Accessory Clinical Findings    Recent Labs: No results found for requested labs within last 8760 hours.   Recent Lipid Panel No results found for: CHOL, TRIG, HDL, CHOLHDL, VLDL, LDLCALC, LDLDIRECT  ECG personally reviewed by me today-none today.  Echocardiogram 08/23/2020 Normal LV function, G1 DD, and no valvular abnormalities.  EKG 09/16/2020 Normal sinus rhythm 72 bpm  Coronary CTA 07/24/2019 IMPRESSION: 1. Coronary calcium score of 2768. This was 53 percentile for age and sex matched control.  2. Normal coronary origin with right dominance.  3. Suspicion for a severe stenosis in the mid portion of a small PDA and ostial portion of a large 1. diagonal artery. CAD-RADS 4 Severe stenosis. Additional analysis with CT FFR will be submitted.  IMPRESSION: 1. CT FFR showed significant stenoses in the mid portion of a small PDA (2.1 mm) and in the ostial portion of a large 1. diagonal artery (lumen > 3.5 mm). Consider symptom-guided anti-ischemic pharmacotherapy as well as risk factor modification per guideline directed care. If this approach fails consider cardiac catheterization, however location of a stenosis in the ostial portion of D1 originating from the proximal LAD might preclude an intervention. Assessment & Plan   1.  Essential hypertension-BP today 142/76.  Much better controlled at home 120/60-70s Reports compliance with low-sodium diet. Continue atenolol, lisinopril, HCTZ, amlodipine Heart healthy low-sodium diet-salty 6 given Increase physical activity as tolerated  Coronary atherosclerosis-no chest pain today.  Coronary CTA and FFR completed 07/24/2019.  Medical management recommended Continue atenolol, nitroglycerin, aspirin Heart healthy low-sodium diet-salty 6 given Increase physical activity as tolerated  Hyperlipidemia-reports compliance with statin Continue continue simvastatin Heart healthy  low-sodium high-fiber diet Increase physical activity as tolerated Repeat fasting lipid liver  Diabetes mellitus-glucose 190 on 07/16/2019 Heart healthy low-sodium carb modified diet Increase physical activity as tolerated Follows with PCP  Disposition: Follow-up with Dr. Sallyanne Kuster in 6 months.   Jossie Ng. Khanh Tanori NP-C    11/26/2020, 12:16 PM [place] [address] Office [phone] Fax [phone]  Notice: This dictation was prepared with Dragon dictation along with smaller phrase technology. Any transcriptional errors that result from this process are unintentional  and may not be corrected upon review.  I spent greater than 15 minutes reviewing the patient's medications, during exam and counseling patient.  I spent greater than 20 minutes prior to the visit reviewing the patient's cardiac medications, past medical history, and cardiac exams.

## 2020-11-26 ENCOUNTER — Ambulatory Visit: Payer: Medicare Other | Admitting: General Practice

## 2020-11-26 ENCOUNTER — Other Ambulatory Visit: Payer: Self-pay

## 2020-11-26 ENCOUNTER — Encounter: Payer: Self-pay | Admitting: General Practice

## 2020-11-26 VITALS — BP 142/76 | HR 67 | Ht 68.0 in | Wt 218.0 lb

## 2020-11-26 DIAGNOSIS — I10 Essential (primary) hypertension: Secondary | ICD-10-CM | POA: Diagnosis not present

## 2020-11-26 DIAGNOSIS — I2510 Atherosclerotic heart disease of native coronary artery without angina pectoris: Secondary | ICD-10-CM

## 2020-11-26 DIAGNOSIS — E1151 Type 2 diabetes mellitus with diabetic peripheral angiopathy without gangrene: Secondary | ICD-10-CM

## 2020-11-26 DIAGNOSIS — E7800 Pure hypercholesterolemia, unspecified: Secondary | ICD-10-CM | POA: Diagnosis not present

## 2020-11-26 NOTE — Patient Instructions (Signed)
Medication Instructions:  The current medical regimen is effective;  continue present plan and medications as directed. Please refer to the Current Medication list given to you today.  *If you need a refill on your cardiac medications before your next appointment, please call your pharmacy*  Lab Work:   Testing/Procedures:  NONE    NONE  Special Instructions PLEASE READ AND FOLLOW SALTY 6-ATTACHED-1,800mg  daily  PLEASE INCREASE PHYSICAL ACTIVITY AS TOLERATED  Follow-Up: Your next appointment:  6 month(s) In Person with Thurmon Fair, MD OR IF UNAVAILABLE JESSE CLEAVER, FNP-C. Please contact our office 2 months in advance to schedule this appointment   At [facility], you and your health needs are our priority.  As part of our continuing mission to provide you with exceptional heart care, we have created designated Provider Care Teams.  These Care Teams include your primary Cardiologist (physician) and Advanced Practice Providers (APPs -  Physician Assistants and Nurse Practitioners) who all work together to provide you with the care you need, when you need it.            6 SALTY THINGS TO AVOID     1,800MG  DAILY

## 2021-03-13 NOTE — Telephone Encounter (Signed)
Received fax from [place] on pt's CPAP setup date. Pt was set up on CPAP 03/09/21. Pt does not have a f/u appt scheduled. Attempted to call pt to get him scheduled for a f/u appt with either VS or APP 31-90 days from the setup but unable to reach. Left message for pt to return call.

## 2021-04-30 ENCOUNTER — Other Ambulatory Visit: Payer: Self-pay

## 2021-04-30 ENCOUNTER — Ambulatory Visit: Payer: Medicare Other | Admitting: Cardiovascular Disease

## 2021-04-30 ENCOUNTER — Encounter: Payer: Self-pay | Admitting: Cardiovascular Disease

## 2021-04-30 VITALS — BP 152/88 | HR 72 | Ht 68.0 in | Wt 218.0 lb

## 2021-04-30 DIAGNOSIS — I10 Essential (primary) hypertension: Secondary | ICD-10-CM

## 2021-04-30 DIAGNOSIS — I2510 Atherosclerotic heart disease of native coronary artery without angina pectoris: Secondary | ICD-10-CM | POA: Insufficient documentation

## 2021-04-30 DIAGNOSIS — E7800 Pure hypercholesterolemia, unspecified: Secondary | ICD-10-CM

## 2021-04-30 DIAGNOSIS — E1151 Type 2 diabetes mellitus with diabetic peripheral angiopathy without gangrene: Secondary | ICD-10-CM | POA: Diagnosis not present

## 2021-04-30 DIAGNOSIS — I739 Peripheral vascular disease, unspecified: Secondary | ICD-10-CM

## 2021-04-30 NOTE — Patient Instructions (Signed)

## 2021-04-30 NOTE — Progress Notes (Signed)
Cardiology Consultation Note:    Date:  04/30/2021   ID:  Vincent Meyers, DOB [birth year], MRN [id number]  PCP:  Raina Mina., MD  Cardiologist:  Sanda Klein, MD  Electrophysiologist:  None   Referring MD: Raina Mina., MD   No chief complaint on file.  History of Present Illness:    Vincent Meyers is a 75 y.o. male with a hx of CAD with exertional angina, PAD, DM, HLP, HTN, pulmonary fibrosis .  Coronary CTA August 22, 2020 showed widespread atherosclerosis (Calcium score in the 96 percentile).  He was noted to have a 50-60% mid-distal RCA, greater than 70% PDA, 24-49% left main, 50-69% proximal-mid LAD and D1 with severe greater than 75% stenosis.  FFR showed significant stenosis in PDA and ostial D1.  He had angina walking uphill and during intercourse. Symptoms are much improved on beta blocker and calcium channel blocker therapy.  He can do household chores, yard work, working in his garage without any complaints of chest pressure.  He does have some exertional dyspnea, NYHA functional class II not really changed.  He has used PDE5 inh for ED, so we are avoiding nitrates.  He brought his automatic blood pressure cuff today and his typical blood pressure has been under 127/75-132/69 range, although when he is upset his blood pressure is often in the high 140s and low 150s.  His heart rate is consistently in the 65-70 range.  His hemoglobin A1c has crept up to 7.0%, although he is not taking any medications for diabetes yet.  As he has been trying to eat a low-sodium diet, unfortunately his intake of potatoes has increased.  His recent lipid profile showed improved LDL at 65, but his HDL remains quite low.  He is mildly obese with a BMI of 33.  He recently recovered from a mildly symptomatic case of COVID-19.  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  He has a longstanding history of problems with PAD, although the history is  not very clear.  He says he initially had some type of abdominal surgery that required removal of a segment of intestine and that an abnormality with 1 of his pelvic arteries was detected at that time.  He subsequently underwent a procedure by Dr. Donnetta Hutching (stent?).  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  And had a repeat procedure, possibly a surgical bypass.  I cannot find any records other than a CT from 2006 that shows a thrombosed left external iliac artery.  "In comparison with [facility] pelvic CT 02/26/05 there is no interval change in aneurysmal dilatation of the left common iliac artery measuring 6.2cm AP x 5.2cm wide (image 65) with likely anterolateral extensive thrombosis and enhancing central patency posteriorly.  The left external iliac artery shows thrombosis.  The left common femoral artery is patent constituted by collateral vessels." [03/04/2005]  Past Medical History:  Diagnosis Date   Diabetes mellitus without complication ([place])    Erectile dysfunction    Hyperlipidemia    Hypertension     Past Surgical History:  Procedure Laterality Date   ILIAC ARTERY ANEURYSM REPAIR Left 2006   Dr. Donnetta Hutching (redo surgery)   REPLACEMENT TOTAL KNEE BILATERAL      Current Medications: Current Meds  Medication Sig   aspirin EC 81 MG tablet Take by mouth.   atenolol (  TENORMIN) 50 MG tablet Take 1.5 tablets (75 mg total) by mouth daily.   Flaxseed, Linseed, (FLAXSEED OIL PO) Take by mouth.   hydrochlorothiazide (HYDRODIURIL) 25 MG tablet TAKE 1 TABLET BY MOUTH DAILY   lisinopril (ZESTRIL) 40 MG tablet Take 1 tablet by mouth daily.   Magnesium Gluconate 550 MG TABS Take by mouth.   Omega-3 1000 MG CAPS Take by mouth.   omeprazole (PRILOSEC) 20 MG capsule Take 1 capsule by mouth daily.   Probiotic Product (PROBIOTIC-10 PO) Take by mouth.   simvastatin (ZOCOR) 40 MG tablet  Take 1 tablet (40 mg total) by mouth daily at 6 PM.   tadalafil (CIALIS) 20 MG tablet Take by mouth.   Turmeric (QC TUMERIC COMPLEX PO) Take by mouth.     Allergies:   Patient has no known allergies.   Social History   Socioeconomic History   Marital status: Widowed    Spouse name: Not on file   Number of children: Not on file   Years of education: Not on file   Highest education level: Not on file  Occupational History   Not on file  Tobacco Use   Smoking status: Former    Pack years: 0.00   Smokeless tobacco: Never  Vaping Use   Vaping Use: Never used  Substance and Sexual Activity   Alcohol use: Yes    Alcohol/week: 2.0 standard drinks    Types: 2 Standard drinks or equivalent per week   Drug use: Never   Sexual activity: Yes  Other Topics Concern   Not on file  Social History Narrative   Not on file   Social Determinants of Health   Financial Resource Strain: Not on file  Food Insecurity: Not on file  Transportation Needs: Not on file  Physical Activity: Not on file  Stress: Not on file  Social Connections: Not on file     Family History: The patient's family history includes Alzheimer's disease in his father; Heart attack in his father; Parkinson's disease in an other family member; Stroke in his father and another family member.  ROS:   Please see the history of present illness.     All other systems reviewed and are negative.  EKGs/Labs/Other Studies Reviewed:    The following studies were reviewed today:   Echocardiogram 08/23/2020 [facility]) Normal LV function, G1 DD, and no valvular abnormalities.  Coronary CTA 07/24/2019 IMPRESSION: 1. Coronary calcium score of 2768. This was 78 percentile for age and sex matched control.   2. Normal coronary origin with right dominance.   3. Suspicion for a severe stenosis in the mid portion of a small PDA and ostial portion of a large 1. diagonal artery. CAD-RADS 4 Severe stenosis. Additional analysis  with CT FFR will be submitted.   IMPRESSION: 1. CT FFR showed significant stenoses in the mid portion of a small PDA (2.1 mm) and in the ostial portion of a large 1. diagonal artery (lumen > 3.5 mm). Consider symptom-guided anti-ischemic pharmacotherapy as well as risk factor modification per guideline directed care. If this approach fails consider cardiac catheterization, however location of a stenosis in the ostial portion of D1 originating from the proximal LAD might preclude an intervention.  EKG:  EKG is ordered today.  It shows NSR, completely normal.  Recent Labs: May 31, 2019 Potassium 4.2, glucose 125, creatinine 0.6, normal liver function tests, hemoglobin 15.9, platelets 207K, hemoglobin A1c 6.2% 03/04/2021 Potassium 4.4, glucose 144, creatinine 0.64, normal liver function tests, hemoglobin 15.1, platelets  206K, hemoglobin A1c 7.0% Recent Lipid Panel May 31, 2019 Total cholesterol 118, triglycerides 117, HDL 31, direct LDL 78 March 04, 2021 Total cholesterol 99, triglycerides 87, HDL 29, direct LDL 65  Physical Exam:    VS:  BP (!) 152/88   Pulse 72   Ht 5\' 8"  (1.727 m)   Wt 218 lb (98.9 kg)   SpO2 93%   BMI 33.15 kg/m     Wt Readings from Last 3 Encounters:  04/30/21 218 lb (98.9 kg)  11/26/20 218 lb (98.9 kg)  11/17/20 218 lb (98.9 kg)      General: Alert, oriented x3, no distress, mildly obese with central adiposity Head: no evidence of trauma, PERRL, EOMI, no exophtalmos or lid lag, no myxedema, no xanthelasma; normal ears, nose and oropharynx Neck: normal jugular venous pulsations and no hepatojugular reflux; brisk carotid pulses without delay and no carotid bruits Chest: clear to auscultation, no signs of consolidation by percussion or palpation, normal fremitus, symmetrical and full respiratory excursions Cardiovascular: normal position and quality of the apical impulse, regular rhythm, normal first and second heart sounds, no murmurs, rubs or  gallops Abdomen: no tenderness or distention, no masses by palpation, no abnormal pulsatility or arterial bruits, normal bowel sounds, no hepatosplenomegaly Extremities: no clubbing, cyanosis or edema; 2+ radial, ulnar and brachial pulses bilaterally; 2+ right femoral, posterior tibial and dorsalis pedis pulses; 2+ left femoral, posterior tibial and dorsalis pedis pulses; no subclavian or femoral bruits Neurological: grossly nonfocal Psych: Normal mood and affect   ASSESSMENT:    1. Atherosclerosis of coronary artery of native heart without angina pectoris, unspecified vessel or lesion type   2. Hypercholesterolemia   3. Diabetes mellitus with peripheral vascular disease ([place])   4. Essential hypertension   5. PAD (peripheral artery disease) ([facility])     PLAN:    In order of problems listed above:  CAD: Stable angina CCS class 1.  He has some mild exertional dyspnea, but no longer has exertional angina.  On aspirin and statin, beta blocker and amlodipine.  HLP: LDL in target range.  HDL remains low.  Discussed the importance of weight loss and a low carbohydrate diet to improve this. DM: Well-controlled with diet only, but the hemoglobin A1c has steadily increased over the last couple of years.  Discussed avoiding sugars, juices, sodas and starches with high glycemic index such as potatoes, bananas, white bread, rice, pasta, etc. HTN: Adequate control.  His blood pressure is a little high today but he is nervous.  If he needs additional blood pressure medication I would recommend increasing the atenolol to 100 mg once daily.  PAD: The history of his peripheral vascular problems is not clear to me.  They seem to be quiescent since 2006.  I do not think he has had any follow-up since then.  If he does need a coronary angiogram we would plan to do this from a radial approach, but knowing the anatomy of his lower extremity arterial system might be important for future planning.   Medication  Adjustments/Labs and Tests Ordered: Current medicines are reviewed at length with the patient today.  Concerns regarding medicines are outlined above.  Orders Placed This Encounter  Procedures   EKG 12-Lead    No orders of the defined types were placed in this encounter.   Patient Instructions  Medication Instructions:  No changes *If you need a refill on your cardiac medications before your next appointment, please call your pharmacy*   Lab Work:  None ordered If you have labs (blood work) drawn today and your tests are completely normal, you will receive your results only by: [place] (if you have MyChart) OR A paper copy in the mail If you have any lab test that is abnormal or we need to change your treatment, we will call you to review the results.   Testing/Procedures: None ordered   Follow-Up: At [facility], you and your health needs are our priority.  As part of our continuing mission to provide you with exceptional heart care, we have created designated Provider Care Teams.  These Care Teams include your primary Cardiologist (physician) and Advanced Practice Providers (APPs -  Physician Assistants and Nurse Practitioners) who all work together to provide you with the care you need, when you need it.  We recommend signing up for the patient portal called "MyChart".  Sign up information is provided on this After Visit Summary.  MyChart is used to connect with patients for Virtual Visits (Telemedicine).  Patients are able to view lab/test results, encounter notes, upcoming appointments, etc.  Non-urgent messages can be sent to your provider as well.   To learn more about what you can do with MyChart, go to [email or web address].    Your next appointment:   12 month(s)  The format for your next appointment:   In Person  Provider:   You may see Sanda Klein, MD or one of the following Advanced Practice Providers on your designated Care Team:   Almyra Deforest,  PA-C Fabian Sharp, [place] or  Roby Lofts, PA-C   Signed, Sanda Klein, MD  04/30/2021 8:49 AM    [place]

## 2021-05-18 ENCOUNTER — Ambulatory Visit: Payer: Medicare Other | Admitting: Pulmonary Disease

## 2021-06-02 ENCOUNTER — Other Ambulatory Visit: Payer: Self-pay

## 2021-06-02 ENCOUNTER — Ambulatory Visit: Payer: Medicare Other | Admitting: Pulmonary Disease

## 2021-06-02 ENCOUNTER — Encounter: Payer: Self-pay | Admitting: Pulmonary Disease

## 2021-06-02 VITALS — BP 140/72 | HR 63 | Temp 97.8°F | Ht 68.0 in | Wt 212.6 lb

## 2021-06-02 DIAGNOSIS — J849 Interstitial pulmonary disease, unspecified: Secondary | ICD-10-CM

## 2021-06-02 DIAGNOSIS — G4733 Obstructive sleep apnea (adult) (pediatric): Secondary | ICD-10-CM | POA: Diagnosis not present

## 2021-06-02 LAB — C-REACTIVE PROTEIN: CRP: <1 mg/dL (ref 0.5–20.0)

## 2021-06-02 NOTE — Progress Notes (Signed)
[facility]  Chief Complaint  Patient presents with   Follow-up    Patient feels good overall, sleeping is better and machine is working good.    Constitutional:  BP 140/72 (BP Location: Left Arm, Patient Position: Sitting, Cuff Size: Normal)   Pulse 63   Temp 97.8 F (36.6 C) (Oral)   Ht 5\' 8"  (1.727 m)   Wt 212 lb 9.6 oz (96.4 kg)   SpO2 98%   BMI 32.33 kg/m   Past Medical History:  DM type 2, ED, HLD, HTN  Past Surgical History:  His  has a past surgical history that includes Iliac artery aneurysm repair (Left, 2006) and Replacement total knee bilateral.  Brief Summary:  Vincent Meyers is a 75 y.o. male former smoker with interstitial lung disease and obstructive sleep apnea.  He worked as a Building control surveyor.      Subjective:   PFT showed mild restriction and diffusion defect.    Home sleep study showed moderate sleep apnea.  CT chest from October showed UIP pattern.  Has cough with clear sputum.  Feels like breathing is okay otherwise.  Concerned about having to add any more medicines.  Sleep better with CPAP.  Has full face mask.  Has mask leak and mouth has some dryness.  Physical Exam:   Appearance - well kempt   ENMT - no sinus tenderness, no oral exudate, no LAN, Mallampati 3 airway, no stridor  Respiratory - basilar crackles  CV - s1s2 regular rate and rhythm, no murmurs  Ext - no clubbing, no edema  Skin - no rashes  Psych - normal mood and affect   Pulmonary testing:  PFT 11/17/20 >> FEV1 2.73 (94%), FEV1% 83, TLC 4.36 (65%), DLCO 67%  Chest Imaging:  Cardiac CT 07/25/19 >> mild fibrotic changes at bases Rt > Lt CT chest 08/24/20 >> UIP pattern  Sleep Tests:  HST 10/25/20 >> AHI 23.4, SpO2 low 78% Auto CPAP 05/02/21 to 05/31/21 >> used on 30 of 30 nights with average 8 hrs 29 minutes.  Average AHI 6.3 with median CPAP 7 and 95 th percentile CPAP 11 cm H2O.  Air leak noted.  Cardiac Tests:    Social  History:  He  reports that he quit smoking about 33 years ago. His smoking use included cigarettes. He started smoking about 56 years ago. He has a 46.00 pack-year smoking history. He has never used smokeless tobacco. He reports current alcohol use of about 2.0 standard drinks of alcohol per week. He reports that he does not use drugs.  Family History:  His family history includes Alzheimer's disease in his father; Heart attack in his father; Parkinson's disease in an other family member; Stroke in his father and another family member.     Assessment/Plan:   ILD with UIP pattern. - will arrange for serology - discussed option of anti-fibrotic therapy; he would like to defer this for now - will need repeat PFT and HRCT later this year to monitor  Obstructive sleep apnea. - reviewed his sleep study - discussed how sleep apnea can impact his health - he is compliant with CPAP and reports benefit from therapy - uses [employer] for his DME - continue auto CPAP 5 to 15 cm H2O - discussed options to assist with mask leak in setting of him having a beard  Coronary artery disease, hypertension. - followed by Dr. Dani Gobble Croitoru with [facility]  Time Spent Involved in Patient Care  on Day of Examination:  33 minutes  Follow up:   Patient Instructions  Lab tests today  Can try mucinex to help with cough and chest congestion  Follow up in 3 months  Medication List:   Allergies as of 06/02/2021   No Known Allergies      Medication List        Accurate as of June 02, 2021 12:12 PM. If you have any questions, ask your nurse or doctor.          amLODipine 5 MG tablet Commonly known as: NORVASC Take 1 tablet (5 mg total) by mouth daily.   aspirin EC 81 MG tablet Take by mouth.   atenolol 50 MG tablet Commonly known as: TENORMIN Take 1.5 tablets (75 mg total) by mouth daily.   FLAXSEED OIL PO Take by mouth.   hydrochlorothiazide 25 MG tablet Commonly known as:  HYDRODIURIL TAKE 1 TABLET BY MOUTH DAILY   lisinopril 40 MG tablet Commonly known as: ZESTRIL Take 1 tablet by mouth daily.   Magnesium Gluconate 550 MG Tabs Take by mouth.   Omega-3 1000 MG Caps Take by mouth.   omeprazole 20 MG capsule Commonly known as: PRILOSEC Take 1 capsule by mouth daily.   PROBIOTIC-10 PO Take by mouth.   QC TUMERIC COMPLEX PO Take by mouth.   simvastatin 40 MG tablet Commonly known as: ZOCOR Take 1 tablet (40 mg total) by mouth daily at 6 PM.   tadalafil 20 MG tablet Commonly known as: CIALIS Take by mouth.        Signature:  Chesley Mires, MD [place] Pager - [phone] 06/02/2021, 12:12 PM

## 2021-06-02 NOTE — Patient Instructions (Signed)
Lab tests today  Can try mucinex to help with cough and chest congestion  Follow up in 3 months

## 2021-06-03 LAB — JO-1 ANTIBODY-IGG: Jo-1 Autoabs: <1 AI

## 2021-06-03 LAB — ANTI-SCLERODERMA ANTIBODY: Scleroderma (Scl-70) (ENA) Antibody, IgG: <1 AI

## 2021-06-03 LAB — ANCA SCREEN W REFLEX TITER: ANCA Screen: NEGATIVE

## 2021-06-03 LAB — RHEUMATOID FACTOR: Rheumatoid fact SerPl-aCnc: <14 IU/mL (ref <14)

## 2021-06-04 LAB — ANA W/REFLEX IF POSITIVE: Anti Nuclear Antibody (ANA): NEGATIVE

## 2021-06-12 ENCOUNTER — Other Ambulatory Visit: Payer: Self-pay

## 2021-06-12 MED ORDER — AMLODIPINE BESYLATE 5 MG PO TABS: 5.0000 mg | ORAL_TABLET | Freq: Every day | ORAL | 3 refills | Status: DC

## 2021-09-11 ENCOUNTER — Encounter (HOSPITAL_COMMUNITY): Payer: Self-pay | Admitting: Emergency Medicine

## 2021-09-11 ENCOUNTER — Other Ambulatory Visit: Payer: Self-pay

## 2021-09-11 ENCOUNTER — Ambulatory Visit (HOSPITAL_COMMUNITY)
Admission: EM | Admit: 2021-09-11 | Discharge: 2021-09-11 | Disposition: A | Discharge status: Home / Self Care | Payer: Medicare Other | Attending: Emergency Medicine | Admitting: Emergency Medicine

## 2021-09-11 DIAGNOSIS — K1379 Other lesions of oral mucosa: Secondary | ICD-10-CM | POA: Diagnosis not present

## 2021-09-11 MED ORDER — LIDOCAINE VISCOUS HCL 2 % MT SOLN: 15.0000 mL | OROMUCOSAL | 0 refills | Status: DC | PRN

## 2021-09-11 NOTE — ED Provider Notes (Signed)
[facility]    CSN: [id number] Arrival date & time: 09/11/21  8469      History   Chief Complaint Chief Complaint  Patient presents with   Mouth Lesions    HPI Vincent Meyers is a 75 y.o. male.   Patient presents with dry mouth with salty taste for 8 months and sores on the roof of his mouth for 2 months.  Painful to eat and pain elicited when opening closing mouth.  Symptoms occur predominantly on the left upper side.  Endorses that his family member saw a lesion.  All symptoms began after beginning use of CPAP with mouth attachment.  Denies dental decay, difficulty breathing, difficulty swallowing, fever, chills, drainage, facial swelling.  Patient very concerned about mouth cancer.  Former smoker.  No family history of cancer, no personal history of cancer.  Last dental exam within the last 6 months.  Has establish care with PCP.  Concerned about weight loss of 20 pounds over a 85-month span, patient endorses that he has been trying to lose weight but he has not been this successful.     Past Medical History:  Diagnosis Date   Diabetes mellitus without complication [facility])    Erectile dysfunction    Hyperlipidemia    Hypertension     Patient Active Problem List   Diagnosis Date Noted   Atherosclerosis of coronary artery of native heart without angina pectoris 04/30/2021   Hypercholesterolemia 04/30/2021   OSA (obstructive sleep apnea) 11/17/2020   ILD (interstitial lung disease) ([place]) 11/17/2020   PAD (peripheral artery disease) ([place]) 05/29/2018   Benign prostatic hyperplasia with weak urinary stream 10/17/2017   Diabetes mellitus with peripheral vascular disease ([place]) 05/12/2016   Hyperlipemia, mixed 05/12/2016   Essential hypertension 05/12/2016    Past Surgical History:  Procedure Laterality Date   ILIAC ARTERY ANEURYSM REPAIR Left 2006   Dr. Donnetta Hutching (redo surgery)   REPLACEMENT TOTAL KNEE BILATERAL         Home Medications    Prior to Admission  medications   Medication Sig Start Date End Date Taking? Authorizing Provider  amLODipine (NORVASC) 5 MG tablet Take 1 tablet (5 mg total) by mouth daily. 06/12/21 09/10/21  Croitoru, Mihai, MD  aspirin EC 81 MG tablet Take by mouth.    [provider]  atenolol (TENORMIN) 50 MG tablet Take 1.5 tablets (75 mg total) by mouth daily. 06/29/19   Croitoru, Mihai, MD  Flaxseed, Linseed, (FLAXSEED OIL PO) Take by mouth.    [provider]  hydrochlorothiazide (HYDRODIURIL) 25 MG tablet TAKE 1 TABLET BY MOUTH DAILY 11/11/16   [provider]  lisinopril (ZESTRIL) 40 MG tablet Take 1 tablet by mouth daily. 06/02/20   [provider]  Magnesium Gluconate 550 MG TABS Take by mouth.    [provider]  Omega-3 1000 MG CAPS Take by mouth.    [provider]  omeprazole (PRILOSEC) 20 MG capsule Take 1 capsule by mouth daily. 06/02/20   [provider]  Probiotic Product (PROBIOTIC-10 PO) Take by mouth.    [provider]  simvastatin (ZOCOR) 40 MG tablet Take 1 tablet (40 mg total) by mouth daily at 6 PM. 05/07/20   Croitoru, Mihai, MD  tadalafil (CIALIS) 20 MG tablet Take by mouth.    [provider]  Turmeric (QC TUMERIC COMPLEX PO) Take by mouth.    [provider]    Family History Family History  Problem Relation Age of Onset  Heart attack Father    Stroke Father    Alzheimer's disease Father    Stroke Other    Parkinson's disease Other     Social History Social History   Tobacco Use   Smoking status: Former    Packs/day: 2.00    Years: 23.00    Pack years: 46.00    Types: Cigarettes    Start date: 1966    Quit date: 1989    Years since quitting: 33.8   Smokeless tobacco: Never  Vaping Use   Vaping Use: Never used  Substance Use Topics   Alcohol use: Yes    Alcohol/week: 2.0 standard drinks    Types: 2 Standard drinks or equivalent per week   Drug use: Never     Allergies   Patient has no  known allergies.   Review of Systems Review of Systems Defer to HPI    Physical Exam Triage Vital Signs ED Triage Vitals  Enc Vitals Group     BP 09/11/21 0933 (!) 177/76     Pulse Rate 09/11/21 0933 69     Resp 09/11/21 0933 17     Temp 09/11/21 0933 98.2 F (36.8 C)     Temp Source 09/11/21 0933 Oral     SpO2 09/11/21 0933 97 %     Weight --      Height --      Head Circumference --      Peak Flow --      Pain Score 09/11/21 0932 1     Pain Loc --      Pain Edu? --      Excl. in [place]? --    No data found.  Updated Vital Signs BP (!) 177/76 (BP Location: Left Arm)   Pulse 69   Temp 98.2 F (36.8 C) (Oral)   Resp 17   SpO2 97%   Visual Acuity Right Eye Distance:   Left Eye Distance:   Bilateral Distance:    Right Eye Near:   Left Eye Near:    Bilateral Near:     Physical Exam Constitutional:      Appearance: Normal appearance. He is normal weight.  HENT:     Head: Normocephalic.     Mouth/Throat:      Comments: Less than 0.5 cm Vincent Meyers macerated macule present on the left side of upper gumline located where patient's partial dental plate states, no lesions noted, no gingival swelling, no dental decay, no dental abscess,  mouth is moist Eyes:     Extraocular Movements: Extraocular movements intact.  Pulmonary:     Effort: Pulmonary effort is normal.  Skin:    General: Skin is warm and dry.  Neurological:     Mental Status: He is alert and oriented to person, place, and time. Mental status is at baseline.  Psychiatric:        Mood and Affect: Mood normal.     UC Treatments / Results  Labs (all labs ordered are listed, but only abnormal results are displayed) Labs Reviewed - No data to display  EKG   Radiology No results found.  Procedures Procedures (including critical care time)  Medications Ordered in UC Medications - No data to display  Initial Impression / Assessment and Plan / UC Course  I have reviewed the triage vital signs and  the nursing notes.  Pertinent labs & imaging results that were available during my care of the patient were reviewed by me and considered in my medical  decision making (see chart for details).  Pain in the mouth  Low suspicion of cancer today, no lesions noted or erosion of tissues or teeth noted, advised patient to follow-up with pulmonologist for evaluation of CPAP due to all symptoms beginning after initiation, advised patient to follow-up with dentist for thorough evaluation of teeth and gumline as well as reevaluation of partial dental plate, prescribed viscous lidocaine for use until follow-up visit, provided patient with reassurance about weight loss and attributed in to intentional weight loss by patient and that current methods are effective Final Clinical Impressions(s) / UC Diagnoses   Final diagnoses:  None   Discharge Instructions   None    ED Prescriptions   None    PDMP not reviewed this encounter.   Hans Eden, NP 09/11/21 1030

## 2021-09-11 NOTE — Discharge Instructions (Addendum)
I do not see any lesions in your mouth, eroding of your teeth or gums  Please follow-up with your pulmonologist for reevaluation of your CPAP and follow-up with your dentist for thorough evaluation of your partial dental plate, gums and teeth  You may use the lidocaine solution every 4 hours as needed to help relieve discomfort, gargle and spit

## 2021-09-28 ENCOUNTER — Other Ambulatory Visit: Payer: Self-pay

## 2021-09-28 ENCOUNTER — Ambulatory Visit: Payer: Medicare HMO | Admitting: Pulmonary Disease

## 2021-09-28 ENCOUNTER — Encounter: Payer: Self-pay | Admitting: Pulmonary Disease

## 2021-09-28 VITALS — BP 154/72 | HR 65 | Temp 98.4°F | Ht 68.0 in | Wt 197.0 lb

## 2021-09-28 DIAGNOSIS — J849 Interstitial pulmonary disease, unspecified: Secondary | ICD-10-CM

## 2021-09-28 DIAGNOSIS — G4733 Obstructive sleep apnea (adult) (pediatric): Secondary | ICD-10-CM

## 2021-09-28 MED ORDER — NYSTATIN 100000 UNIT/ML MT SUSP: 5.0000 mL | Freq: Four times a day (QID) | OROMUCOSAL | 0 refills | Status: DC

## 2021-09-28 NOTE — Patient Instructions (Signed)
Nystatin 5 ml swish and swallow four times daily for 5 days  Will have [employer] change your auto CPAP to 5 to 8 cm water pressure  Continue using Biotene to help with mouth dryness  Will schedule CT chest and pulmonary function test for December 2022  Follow up in 2 months

## 2021-09-28 NOTE — Progress Notes (Signed)
[facility]  Chief Complaint  Patient presents with   Follow-up    ILD OSA    Constitutional:  BP (!) 154/72 (BP Location: Left Arm, Cuff Size: Normal)   Pulse 65   Temp 98.4 F (36.9 C) (Oral)   Ht 5\' 8"  (1.727 m)   Wt 197 lb (89.4 kg)   SpO2 97%   BMI 29.95 kg/m   Past Medical History:  DM type 2, ED, HLD, HTN  Past Surgical History:  His  has a past surgical history that includes Iliac artery aneurysm repair (Left, 2006) and Replacement total knee bilateral.  Brief Summary:  Vincent Meyers is a 75 y.o. male former smoker with interstitial lung disease and obstructive sleep apnea.  He worked as a Building control surveyor.      Subjective:   He continues to have dryness and feels his mouth is sore.  Using CPAP nightly.  Not having cough, wheeze, or sputum.  Worried about his diabetes.  Has changed his diet and weight is coming down.  Physical Exam:   Appearance - well kempt   ENMT - no sinus tenderness, no oral exudate, no LAN, Mallampati 3 airway, no stridor, faint white plaques on posterior pharynx  Respiratory - faint basilar crackles  CV - s1s2 regular rate and rhythm, no murmurs  Ext - no clubbing, no edema  Skin - no rashes  Psych - normal mood and affect    Pulmonary testing:  PFT 11/17/20 >> FEV1 2.73 (94%), FEV1% 83, TLC 4.36 (65%), DLCO 67% Serology 06/02/21 >> negative  Chest Imaging:  Cardiac CT 07/25/19 >> mild fibrotic changes at bases Rt > Lt CT chest 08/24/20 >> UIP pattern  Sleep Tests:  HST 10/25/20 >> AHI 23.4, SpO2 low 78% Auto CPAP 08/26/21 to 09/24/21 >> used on 27 of 30 nights with average 8 hrs 46 min.  Average AHI 5.5 with median CPAP 7 and 95 th percentile CPAP 11 cm H2O  Cardiac Tests:    Social History:  He  reports that he quit smoking about 33 years ago. His smoking use included cigarettes. He started smoking about 56 years ago. He has a 46.00 pack-year smoking history. He has never used  smokeless tobacco. He reports current alcohol use of about 2.0 standard drinks per week. He reports that he does not use drugs.  Family History:  His family history includes Alzheimer's disease in his father; Heart attack in his father; Parkinson's disease in an other family member; Stroke in his father and another family member.     Assessment/Plan:   ILD with UIP pattern. - repeat PFT and HCRT chest in December 2022  Obstructive sleep apnea. - he is compliant with CPAP and reports benefit from therapy - having trouble with mouth dryness; continue biotene - will change auto CPAP to 5 to 8 cm H2O - uses [employer] for his DME  Thrush. - will have him use nystatin for 5 days - if mouth soreness persists, then would need referral to ENT  Coronary artery disease, hypertension. - followed by Dr. Dani Gobble Croitoru with [facility]  Time Spent Involved in Patient Care on Day of Examination:  32 minutes  Follow up:   Patient Instructions  Nystatin 5 ml swish and swallow four times daily for 5 days  Will have [employer] change your auto CPAP to 5 to 8 cm water pressure  Continue using Biotene to help with mouth dryness  Will schedule CT chest  and pulmonary function test for December 2022  Follow up in 2 months  Medication List:   Allergies as of 09/28/2021   No Known Allergies      Medication List        Accurate as of September 28, 2021 11:20 AM. If you have any questions, ask your nurse or doctor.          amLODipine 5 MG tablet Commonly known as: NORVASC Take 1 tablet (5 mg total) by mouth daily.   aspirin EC 81 MG tablet Take by mouth.   atenolol 50 MG tablet Commonly known as: TENORMIN Take 1.5 tablets (75 mg total) by mouth daily.   FLAXSEED OIL PO Take by mouth.   hydrochlorothiazide 25 MG tablet Commonly known as: HYDRODIURIL TAKE 1 TABLET BY MOUTH DAILY   lidocaine 2 % solution Commonly known as: XYLOCAINE Use as directed 15 mLs in the mouth or  throat as needed for mouth pain.   lisinopril 40 MG tablet Commonly known as: ZESTRIL Take 1 tablet by mouth daily.   Magnesium Gluconate 550 MG Tabs Take by mouth.   nystatin 100000 UNIT/ML suspension Commonly known as: MYCOSTATIN Take 5 mLs (500,000 Units total) by mouth 4 (four) times daily. Started by: Chesley Mires, MD   Omega-3 1000 MG Caps Take by mouth.   omeprazole 20 MG capsule Commonly known as: PRILOSEC Take 1 capsule by mouth daily.   PROBIOTIC-10 PO Take by mouth.   QC TUMERIC COMPLEX PO Take by mouth.   simvastatin 40 MG tablet Commonly known as: ZOCOR Take 1 tablet (40 mg total) by mouth daily at 6 PM.   tadalafil 20 MG tablet Commonly known as: CIALIS Take by mouth.        Signature:  Chesley Mires, MD [place] Pager - [phone] 09/28/2021, 11:20 AM

## 2021-09-29 ENCOUNTER — Ambulatory Visit (INDEPENDENT_AMBULATORY_CARE_PROVIDER_SITE_OTHER): Payer: Medicare HMO | Admitting: Family Medicine

## 2021-09-29 ENCOUNTER — Encounter (HOSPITAL_BASED_OUTPATIENT_CLINIC_OR_DEPARTMENT_OTHER): Payer: Self-pay | Admitting: Family Medicine

## 2021-09-29 VITALS — BP 160/88 | HR 72 | Ht 68.0 in | Wt 199.4 lb

## 2021-09-29 DIAGNOSIS — E1151 Type 2 diabetes mellitus with diabetic peripheral angiopathy without gangrene: Secondary | ICD-10-CM | POA: Diagnosis not present

## 2021-09-29 DIAGNOSIS — I10 Essential (primary) hypertension: Secondary | ICD-10-CM | POA: Diagnosis not present

## 2021-09-29 LAB — HEMOGLOBIN A1C
Est. average glucose Bld gHb Est-mCnc: 134 mg/dL
Hgb A1c MFr Bld: 6.3 % — ABNORMAL HIGH (ref 4.8–5.6)

## 2021-09-29 NOTE — Assessment & Plan Note (Signed)
Blood pressure above goal in office today, however patient has not taken his blood pressure medications today Continue with current regimen, will refill hydrochlorothiazide today as patient is out of his medication Recommend continuing close follow-up with cardiology Patient is aware of lifestyle modifications, has been following low-salt diet Adequate control of sleep apnea would likely aid in blood pressure control Adequate control of diabetes also would be of benefit, checking A1c today

## 2021-09-29 NOTE — Progress Notes (Signed)
New Patient Office Visit  Subjective:  Patient ID: Vincent Meyers, male    DOB: Nov 12, 1946  Age: 75 y.o. MRN: [id number]  CC:  Chief Complaint  Patient presents with   Establish Care    For PCP Dr. Tobe Sos. See's cards and pulm (in epic)   Dry Mouth    Patient states he has a dry moth that is accompanied with a salty taste in his mouth. He is concerned that it may be mouth cancer.    HPI Vincent Meyers is a 75 year old male presenting to establish in clinic.  He has current concerns as outlined above.  Past medical history is significant for diabetes, hypertension, pulmonary fibrosis, sleep apnea, hyperlipidemia.  Hypertension: Does follow with cardiology, blood pressure has been difficult to control and he currently takes amlodipine, lisinopril, atenolol, hydrochlorothiazide.  Indicates that he did not take his medications this morning as he fasted for this morning.  Denies any issues with chest pain, headaches.  Has tried to make dietary changes such as low-salt diet.  Diabetes: He reports not specifically being told about whether he has prediabetes or diabetes.  Indicates that he has been checking his blood sugar at home fairly regularly.  Has tried to make some lifestyle modifications, primarily related to diet to reduce carbs in his diet.  Has never been on any medications related to his diabetes.  Most recent hemoglobin A1c was about 6 months ago and was found to be 7.0%.  Dry mouth: Reports that this is been going on since he initiated use of CPAP about 4 months ago.  Feels it has worsened somewhat recently.  Was seen by pulmonologist and was prescribed oral nystatin solution for treatment of possible thrush.  Will be seeing his dentist tomorrow for regular visit, also plans to discuss issue with dry mouth.  Given his history of smoking, patient is concerned about possibility of cancer.  Patient has at least a 40-pack-year history of smoking, quit about 35 years ago.  Patient worked as  a Building control surveyor in the past.  His wife passed away about 4 years ago.  Currently has a girlfriend that he has been with for the past 3 years.  Past Medical History:  Diagnosis Date   Diabetes mellitus without complication ([place])    Erectile dysfunction    Hyperlipidemia    Hypertension     Past Surgical History:  Procedure Laterality Date   ILIAC ARTERY ANEURYSM REPAIR Left 2006   Dr. Donnetta Hutching (redo surgery)   REPLACEMENT TOTAL KNEE BILATERAL      Family History  Problem Relation Age of Onset   Heart attack Father    Stroke Father    Alzheimer's disease Father    Stroke Other    Parkinson's disease Other     Social History   Socioeconomic History   Marital status: Widowed    Spouse name: Not on file   Number of children: Not on file   Years of education: Not on file   Highest education level: Not on file  Occupational History   Not on file  Tobacco Use   Smoking status: Former    Packs/day: 2.00    Years: 23.00    Pack years: 46.00    Types: Cigarettes    Start date: 63    Quit date: 1989    Years since quitting: 33.8   Smokeless tobacco: Never  Vaping Use   Vaping Use: Never used  Substance and Sexual Activity   Alcohol use:  Yes    Alcohol/week: 2.0 standard drinks    Types: 2 Standard drinks or equivalent per week   Drug use: Never   Sexual activity: Yes  Other Topics Concern   Not on file  Social History Narrative   Not on file   Social Determinants of Health   Financial Resource Strain: Not on file  Food Insecurity: Not on file  Transportation Needs: Not on file  Physical Activity: Not on file  Stress: Not on file  Social Connections: Not on file  Intimate Partner Violence: Not on file    Objective:   Today's Vitals: BP (!) 160/88   Pulse 72   Ht 5\' 8"  (1.727 m)   Wt 199 lb 6.4 oz (90.4 kg)   SpO2 98%   BMI 30.32 kg/m   Physical Exam  75 year old male in no acute distress Cardiovascular exam with regular rate and rhythm, no murmurs  appreciated Lungs clear to auscultation bilaterally No obvious white plaques or oral lesions on visual inspection of  Assessment & Plan:   Problem List Items Addressed This Visit       Cardiovascular and Mediastinum   Diabetes mellitus with peripheral vascular disease ([place])    Most recent hemoglobin A1c 7.0% We will check hemoglobin A1c today Discussed option of initiating medication, metformin.  Patient wishes to hold off at this time and check A1c Recommend continue with lifestyle modifications, particularly dietary adjustments as well as regular physical exercise Patient has some hesitancy regarding additional medications as he feels he is already taking a lot of medications.  Patient does have some concern related to his family history of heart disease and stroke.  Did discuss that optimal control of risk factors including blood pressure and blood sugar is essential in prevention      Relevant Orders   Hemoglobin A1c   Essential hypertension - Primary    Blood pressure above goal in office today, however patient has not taken his blood pressure medications today Continue with current regimen, will refill hydrochlorothiazide today as patient is out of his medication Recommend continuing close follow-up with cardiology Patient is aware of lifestyle modifications, has been following low-salt diet Adequate control of sleep apnea would likely aid in blood pressure control Adequate control of diabetes also would be of benefit, checking A1c today       Outpatient Encounter Medications as of 09/29/2021  Medication Sig   aspirin EC 81 MG tablet Take by mouth.   atenolol (TENORMIN) 50 MG tablet Take 1.5 tablets (75 mg total) by mouth daily.   Flaxseed, Linseed, (FLAXSEED OIL PO) Take by mouth.   hydrochlorothiazide (HYDRODIURIL) 25 MG tablet TAKE 1 TABLET BY MOUTH DAILY   lidocaine (XYLOCAINE) 2 % solution Use as directed 15 mLs in the mouth or throat as needed for mouth pain.    lisinopril (ZESTRIL) 40 MG tablet Take 1 tablet by mouth daily.   Magnesium Gluconate 550 MG TABS Take by mouth.   nystatin (MYCOSTATIN) 100000 UNIT/ML suspension Take 5 mLs (500,000 Units total) by mouth 4 (four) times daily.   Omega-3 1000 MG CAPS Take by mouth.   omeprazole (PRILOSEC) 20 MG capsule Take 1 capsule by mouth daily.   Probiotic Product (PROBIOTIC-10 PO) Take by mouth.   simvastatin (ZOCOR) 40 MG tablet Take 1 tablet (40 mg total) by mouth daily at 6 PM.   tadalafil (CIALIS) 20 MG tablet Take by mouth.   Turmeric (QC TUMERIC COMPLEX PO) Take by mouth.   amLODipine (NORVASC)  5 MG tablet Take 1 tablet (5 mg total) by mouth daily.   No facility-administered encounter medications on file as of 09/29/2021.   Spent 45 minutes on this patient encounter, including preparation, chart review, face-to-face counseling with patient and coordination of care, and documentation of encounter  Follow-up: Return in about 4 months (around 01/27/2022) for Follow Up.  Plan for follow-up in about 3 to 4 months to further discuss diabetes management, follow-up on blood pressure  Cardelia Sassano J [place], MD

## 2021-09-29 NOTE — Assessment & Plan Note (Signed)
Most recent hemoglobin A1c 7.0% We will check hemoglobin A1c today Discussed option of initiating medication, metformin.  Patient wishes to hold off at this time and check A1c Recommend continue with lifestyle modifications, particularly dietary adjustments as well as regular physical exercise Patient has some hesitancy regarding additional medications as he feels he is already taking a lot of medications.  Patient does have some concern related to his family history of heart disease and stroke.  Did discuss that optimal control of risk factors including blood pressure and blood sugar is essential in prevention

## 2021-09-29 NOTE — Patient Instructions (Signed)
  Medication Instructions:  Your physician recommends that you continue on your current medications as directed. Please refer to the Current Medication list given to you today. --If you need a refill on any your medications before your next appointment, please call your pharmacy first. If no refills are authorized on file call the office.-- Lab Work: Your physician has recommended that you have lab work today: A1C If you have labs (blood work) drawn today and your tests are completely normal, you will receive your results via [place] a phone call from our staff.  Please ensure you check your voicemail in the event that you authorized detailed messages to be left on a delegated number. If you have any lab test that is abnormal or we need to change your treatment, we will call you to review the results.  Follow-Up: Your next appointment:   Your physician recommends that you schedule a follow-up appointment in: 3-4 MONTHS with Dr. de [place]  You will receive a text message or e-mail with a link to a survey about your care and experience with [place] today! We would greatly appreciate your feedback!   Thanks for letting us be apart of your health journey!!  Primary Care and Sports Medicine   Dr. Arlina Robes [place]   We encourage you to activate your patient portal called "MyChart".  Sign up information is provided on this After Visit Summary.  MyChart is used to connect with patients for Virtual Visits (Telemedicine).  Patients are able to view lab/test results, encounter notes, upcoming appointments, etc.  Non-urgent messages can be sent to your provider as well. To learn more about what you can do with MyChart, please visit --  [email or web address].

## 2021-09-30 ENCOUNTER — Other Ambulatory Visit (HOSPITAL_BASED_OUTPATIENT_CLINIC_OR_DEPARTMENT_OTHER): Payer: Self-pay

## 2021-09-30 ENCOUNTER — Encounter (HOSPITAL_BASED_OUTPATIENT_CLINIC_OR_DEPARTMENT_OTHER): Payer: Self-pay | Admitting: Family Medicine

## 2021-09-30 MED ORDER — HYDROCHLOROTHIAZIDE 25 MG PO TABS: 25.0000 mg | ORAL_TABLET | Freq: Every day | ORAL | 0 refills | Status: DC

## 2021-09-30 NOTE — Telephone Encounter (Signed)
Patient called in to state that his pharmacy did not send the refill request for his hctz. Patint was seen in office yesterday and ok to refill

## 2021-10-29 ENCOUNTER — Other Ambulatory Visit (HOSPITAL_BASED_OUTPATIENT_CLINIC_OR_DEPARTMENT_OTHER): Payer: Self-pay | Admitting: Family Medicine

## 2021-10-30 ENCOUNTER — Ambulatory Visit (INDEPENDENT_AMBULATORY_CARE_PROVIDER_SITE_OTHER)
Admission: RE | Admit: 2021-10-30 | Discharge: 2021-10-30 | Disposition: A | Discharge status: Home / Self Care | Payer: Medicare HMO | Source: Ambulatory Visit | Attending: Pulmonary Disease | Admitting: Pulmonary Disease

## 2021-10-30 ENCOUNTER — Other Ambulatory Visit: Payer: Self-pay

## 2021-10-30 DIAGNOSIS — J849 Interstitial pulmonary disease, unspecified: Secondary | ICD-10-CM

## 2021-11-17 ENCOUNTER — Other Ambulatory Visit (HOSPITAL_BASED_OUTPATIENT_CLINIC_OR_DEPARTMENT_OTHER): Payer: Self-pay

## 2021-11-17 MED ORDER — LISINOPRIL 40 MG PO TABS: 40.0000 mg | ORAL_TABLET | Freq: Every day | ORAL | 2 refills | Status: DC

## 2021-11-26 ENCOUNTER — Other Ambulatory Visit: Payer: Self-pay

## 2021-11-26 ENCOUNTER — Ambulatory Visit (INDEPENDENT_AMBULATORY_CARE_PROVIDER_SITE_OTHER): Payer: Medicare HMO | Admitting: Pulmonary Disease

## 2021-11-26 DIAGNOSIS — J849 Interstitial pulmonary disease, unspecified: Secondary | ICD-10-CM

## 2021-11-26 LAB — PULMONARY FUNCTION TEST
DL/VA % pred: 94 %
DL/VA: 3.81 ml/min/mmHg/L
DLCO cor % pred: 66 %
DLCO cor: 15.89 ml/min/mmHg
DLCO unc % pred: 66 %
DLCO unc: 15.89 ml/min/mmHg
FEF 25-75 Post: 3.55 L/sec
FEF 25-75 Pre: 1.66 L/sec
FEF2575-%Change-Post: 113 %
FEF2575-%Pred-Post: 169 %
FEF2575-%Pred-Pre: 79 %
FEV1-%Change-Post: 30 %
FEV1-%Pred-Post: 101 %
FEV1-%Pred-Pre: 77 %
FEV1-Post: 2.89 L
FEV1-Pre: 2.22 L
FEV1FVC-%Change-Post: 29 %
FEV1FVC-%Pred-Pre: 93 %
FEV6-%Change-Post: 1 %
FEV6-%Pred-Post: 89 %
FEV6-%Pred-Pre: 88 %
FEV6-Post: 3.3 L
FEV6-Pre: 3.26 L
FEV6FVC-%Pred-Post: 106 %
FEV6FVC-%Pred-Pre: 106 %
FVC-%Change-Post: 1 %
FVC-%Pred-Post: 83 %
FVC-%Pred-Pre: 83 %
FVC-Post: 3.31 L
FVC-Pre: 3.27 L
Post FEV1/FVC ratio: 87 %
Post FEV6/FVC ratio: 100 %
Pre FEV1/FVC ratio: 68 %
Pre FEV6/FVC Ratio: 100 %
RV % pred: 62 %
RV: 1.52 L
TLC % pred: 72 %
TLC: 4.78 L

## 2021-11-26 NOTE — Patient Instructions (Signed)
Full PFT performed today. °

## 2021-11-26 NOTE — Progress Notes (Signed)
Full PFT performed today. °

## 2021-12-07 ENCOUNTER — Other Ambulatory Visit: Payer: Self-pay

## 2021-12-07 ENCOUNTER — Ambulatory Visit: Payer: Medicare HMO | Admitting: Pulmonary Disease

## 2021-12-07 ENCOUNTER — Encounter: Payer: Self-pay | Admitting: Adult Health

## 2021-12-07 ENCOUNTER — Ambulatory Visit (INDEPENDENT_AMBULATORY_CARE_PROVIDER_SITE_OTHER): Payer: Medicare HMO | Admitting: Adult Health

## 2021-12-07 DIAGNOSIS — J449 Chronic obstructive pulmonary disease, unspecified: Secondary | ICD-10-CM

## 2021-12-07 DIAGNOSIS — G4733 Obstructive sleep apnea (adult) (pediatric): Secondary | ICD-10-CM | POA: Diagnosis not present

## 2021-12-07 DIAGNOSIS — J849 Interstitial pulmonary disease, unspecified: Secondary | ICD-10-CM | POA: Diagnosis not present

## 2021-12-07 MED ORDER — ALBUTEROL SULFATE HFA 108 (90 BASE) MCG/ACT IN AERS
1.0000 | INHALATION_SPRAY | Freq: Four times a day (QID) | RESPIRATORY_TRACT | 2 refills | Status: DC | PRN
Start: 2021-12-07 — End: 2022-11-04

## 2021-12-07 NOTE — Assessment & Plan Note (Signed)
PFTs do show some mild airflow obstruction with significant reversibility.  May have a component of some mild to moderate COPD with asthma.  Patient has minimum symptoms.  Will give albuterol inhaler to have on hold as needed Hold on maintenance therapy at this time  Plan  Patient Instructions  Activity as tolerated.  Albuterol inhaler 1-2 puffs every 6hr as needed.  Continue on CPAP At bedtime   Saline nasal rinses and saline nasal gel As needed   Work on healthy weight weight  Follow up with Dr. Halford Chessman  in 4-6 months and As needed     '

## 2021-12-07 NOTE — Progress Notes (Signed)
@Patient  ID: Vincent Meyers, male    DOB: Nov 22, 1946, 76 y.o.   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  Average AHI 5.5 with median CPAP 7 and 95 th percentile CPAP 11 cm H2O   12/07/2021 Follow up : ILD and OSA  Patient returns for a 47-month follow-up.  Patient has underlying ILD with UIP pattern.  Patient says overall breathing is doing well. Gets winded with heavy exercise, walk upstairs/incline. No dyspnea at rest or walking flat surface. Daily cough - sometimes is productive-clear and white mucus.  PFTs show mild to moderate airflow obstruction with significant reversibility.  FEV1 101%, ratio 87, FVC 83% positive bronchodilator response, (30% change), DLCO 66%.   Compared to PFTs in December 2021 lung function appears stable with stable diffusing capacity. High-resolution CT chest October 30, 2021 showed patchy areas of groundglass attenuation, subpleural reticulation, traction bronchiectasis and honeycombing.  And are clearly progressed compared to 2020. UIP pattern.  Patient continues to work part-time. 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  Remains active, Has camper and travels. Exercises at gym in pool x 2 days a week  Had Covid in May 2022. No dx of COPD or asthma. Denies wheezing .   Patient has underlying sleep apnea.  He is on nocturnal CPAP.  Patient wears a CPAP every single night.  Says he feels  rested.  CPAP download shows excellent compliance at 97% usage.  Daily average usage at 8 hours.  Patient is on AutoSet CPAP 5 to 8 cm H2O.  AHI 3.8/hour.     No Known Allergies  Immunization History  Administered Date(s) Administered   Influenza, High Dose Seasonal PF 10/24/2019   Influenza,inj,Quad PF,6-35 Mos 10/11/2016, 10/24/2018   Influenza,inj,quad, With Preservative 09/19/2015   Influenza,trivalent, recombinat, inj, PF 08/22/2017   PFIZER(Purple Top)SARS-COV-2 Vaccination 01/06/2020, 01/29/2020   Pneumococcal Conjugate-13 05/19/2016   Pneumococcal Polysaccharide-23 09/05/2012   Tdap 03/12/2015   Zoster, Live 11/05/2013    Past Medical History:  Diagnosis Date   Diabetes mellitus without complication ([place])    Erectile dysfunction    Hyperlipidemia    Hypertension     Tobacco History: Social History   Tobacco Use  Smoking Status Former   Packs/day: 2.00   Years: 23.00   Pack years: 46.00   Types: Cigarettes   Start date: 1966   Quit date: 1989   Years since quitting: 34.0  Smokeless Tobacco Never   Counseling given: Not Answered   Outpatient Medications Prior to Visit  Medication Sig Dispense Refill   aspirin EC 81 MG tablet Take by mouth.     atenolol (TENORMIN) 50 MG tablet Take 1.5 tablets (75 mg total) by mouth daily. 45 tablet 4   Flaxseed, Linseed, (FLAXSEED OIL PO) Take by mouth.     hydrochlorothiazide (HYDRODIURIL) 25 MG tablet Take 1 tablet (25 mg total) by mouth  daily. 90 tablet 0   lidocaine (XYLOCAINE) 2 % solution Use as directed 15 mLs in the mouth or throat as needed for mouth pain. 100 mL 0   lisinopril (ZESTRIL) 40 MG tablet Take 1 tablet (40 mg total) by mouth daily. 30 tablet 2   Magnesium Gluconate 550 MG TABS Take by mouth.     nystatin (MYCOSTATIN) 100000 UNIT/ML suspension Take 5 mLs (500,000 Units total) by mouth 4 (four) times daily. 60 mL 0   Omega-3 1000 MG CAPS Take by mouth.     omeprazole (PRILOSEC) 20 MG capsule Take 1  capsule by mouth daily.     Probiotic Product (PROBIOTIC-10 PO) Take by mouth.     simvastatin (ZOCOR) 40 MG tablet TAKE 1 TABLET(40 MG) BY MOUTH EVERY NIGHT 90 tablet 0   tadalafil (CIALIS) 20 MG tablet Take by mouth.     Turmeric (QC TUMERIC COMPLEX PO) Take by mouth.     amLODipine (NORVASC) 5 MG tablet Take 1 tablet (5 mg total) by mouth daily. 90 tablet 3   No facility-administered medications prior to visit.     Review of Systems:   Constitutional:   No  weight loss, night sweats,  Fevers, chills, fatigue, or  lassitude.  HEENT:   No headaches,  Difficulty swallowing,  Tooth/dental problems, or  Sore throat,                No sneezing, itching, ear ache,  +nasal congestion, post nasal drip,   CV:  No chest pain,  Orthopnea, PND, swelling in lower extremities, anasarca, dizziness, palpitations, syncope.   GI  No heartburn, indigestion, abdominal pain, nausea, vomiting, diarrhea, change in bowel habits, loss of appetite, bloody stools.   Resp:   No chest wall deformity  Skin: no rash or lesions.  GU: no dysuria, change in color of urine, no urgency or frequency.  No flank pain, no hematuria   MS:  No joint pain or swelling.  No decreased range of motion.  No back pain.    Physical Exam  BP 140/60 (BP Location: Left Arm, Patient Position: Sitting, Cuff Size: Normal)    Pulse 72    Temp 98.1 F (36.7 C) (Oral)    Ht 5\' 8"  (1.727 m)    Wt 202 lb 12.8 oz (92 kg)    SpO2 93%    BMI 30.84 kg/m   GEN: A/Ox3; pleasant , NAD, elderly    HEENT:  Enlow/AT,  EACs-clear, TMs-wnl, NOSE-clear, THROAT-clear, no lesions, no postnasal drip or exudate noted.   NECK:  Supple w/ fair ROM; no JVD; normal carotid impulses w/o bruits; no thyromegaly or nodules palpated; no lymphadenopathy.    RESP  BB crackles  no accessory muscle use, no dullness to percussion  CARD:  RRR, no m/r/g, no peripheral edema, pulses intact, no cyanosis or clubbing.  GI:   Soft & nt; nml bowel sounds; no  organomegaly or masses detected.   Musco: Warm bil, no deformities or joint swelling noted.   Neuro: alert, no focal deficits noted.    Skin: Warm, no lesions or rashes    Lab Results:  CBC No results found for: WBC, RBC, HGB, HCT, PLT, MCV, MCH, MCHC, RDW, LYMPHSABS, MONOABS, EOSABS, BASOSABS    BNP No results found for: BNP  ProBNP No results found for: PROBNP  Imaging: No results found.    PFT Results Latest Ref Rng & Units 11/26/2021 11/17/2020  FVC-Pre L 3.27 3.28  FVC-Predicted Pre % 83  82  FVC-Post L 3.31 3.16  FVC-Predicted Post % 83 79  Pre FEV1/FVC % % 68 83  Post FEV1/FCV % % 87 85  FEV1-Pre L 2.22 2.73  FEV1-Predicted Pre % 77 94  FEV1-Post L 2.89 2.69  DLCO uncorrected ml/min/mmHg 15.89 16.02  DLCO UNC% % 66 67  DLCO corrected ml/min/mmHg 15.89 16.02  DLCO COR %Predicted % 66 67  DLVA Predicted % 94 88  TLC L 4.78 4.36  TLC % Predicted % 72 65  RV % Predicted % 62 47    No results found for: NITRICOXIDE      Assessment & Plan:   ILD (interstitial lung disease) ([place]) 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  PFTs show stable lung function however recent high-resolution CT chest does show progression since 2020.  Patient is quite active with no significant symptoms currently.  However we went over IPF with treatment options including antifibrotic's with possible Ofev or Esbriet.  Patient education was given.  Recommend the patient begin antifibrotic therapy.  Patient says he wants to hold off at this time.  We will think about it and discuss it on his return visit. We discussed pulmonary rehab however patient is very active at home and does not feel like he needs it as he goes to exercise at the gym as well.  Plan  Patient Instructions  Activity as tolerated.  Albuterol inhaler 1-2 puffs every 6hr as needed.  Continue on CPAP At bedtime   Saline nasal rinses and saline nasal gel As needed   Work on healthy weight weight  Follow up with  Dr. Halford Chessman  in 4-6 months and As needed       OSA (obstructive sleep apnea) Excellent control and compliance on nocturnal CPAP.  Continue on current settings.  Plan  Patient Instructions  Activity as tolerated.  Albuterol inhaler 1-2 puffs every 6hr as needed.  Continue on CPAP At bedtime   Saline nasal rinses and saline nasal gel As needed   Work on healthy weight weight  Follow up with Dr. Halford Chessman  in 4-6 months and As needed       COPD (chronic obstructive pulmonary disease) ([place]) PFTs do show some mild airflow obstruction with significant reversibility.  May have a component of some mild to moderate COPD with asthma.  Patient has minimum symptoms.  Will give albuterol inhaler to have on hold as needed Hold on maintenance therapy at this time  Plan  Patient Instructions  Activity as tolerated.  Albuterol inhaler 1-2 puffs every 6hr as needed.  Continue on CPAP At bedtime   Saline nasal rinses and saline nasal gel As needed   Work on healthy weight weight  Follow up with Dr. Halford Chessman  in 4-6 months and As needed     '    [place], NP 12/07/2021

## 2021-12-07 NOTE — Assessment & Plan Note (Signed)
Excellent control and compliance on nocturnal CPAP.  Continue on current settings.  Plan  Patient Instructions  Activity as tolerated.  Albuterol inhaler 1-2 puffs every 6hr as needed.  Continue on CPAP At bedtime   Saline nasal rinses and saline nasal gel As needed   Work on healthy weight weight  Follow up with Dr. Halford Chessman  in 4-6 months and As needed

## 2021-12-07 NOTE — Progress Notes (Signed)
Reviewed and agree with assessment/plan.   Chesley Mires, MD [facility] Pulmonary/Critical Care 12/07/2021, 5:00 PM Pager:  [phone]

## 2021-12-07 NOTE — Assessment & Plan Note (Signed)
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  PFTs show stable lung function however recent high-resolution CT chest does show progression since 2020.  Patient is quite active with no significant symptoms currently.  However we went over IPF with treatment options including antifibrotic's with possible Ofev or Esbriet.  Patient education was given.  Recommend the patient begin antifibrotic therapy.  Patient says he wants to hold off at this time.  We will think about it and discuss it on his return visit. We discussed pulmonary rehab however patient is very active at home and does not feel like he needs it as he goes to exercise at the gym as well.  Plan  Patient Instructions  Activity as tolerated.  Albuterol inhaler 1-2 puffs every 6hr as needed.  Continue on CPAP At bedtime   Saline nasal rinses and saline nasal gel As needed   Work on healthy weight weight  Follow up with Dr. Halford Chessman  in 4-6 months and As needed

## 2021-12-07 NOTE — Patient Instructions (Signed)
Activity as tolerated.  Albuterol inhaler 1-2 puffs every 6hr as needed.  Continue on CPAP At bedtime   Saline nasal rinses and saline nasal gel As needed   Work on healthy weight weight  Follow up with Dr. Halford Chessman  in 4-6 months and As needed

## 2021-12-14 ENCOUNTER — Other Ambulatory Visit (HOSPITAL_BASED_OUTPATIENT_CLINIC_OR_DEPARTMENT_OTHER): Payer: Self-pay | Admitting: Family Medicine

## 2021-12-30 ENCOUNTER — Ambulatory Visit (HOSPITAL_BASED_OUTPATIENT_CLINIC_OR_DEPARTMENT_OTHER): Payer: Medicare HMO | Admitting: Family Medicine

## 2022-01-14 ENCOUNTER — Ambulatory Visit (INDEPENDENT_AMBULATORY_CARE_PROVIDER_SITE_OTHER): Payer: Medicare HMO | Admitting: Family Medicine

## 2022-01-14 ENCOUNTER — Encounter (HOSPITAL_BASED_OUTPATIENT_CLINIC_OR_DEPARTMENT_OTHER): Payer: Self-pay | Admitting: Family Medicine

## 2022-01-14 ENCOUNTER — Other Ambulatory Visit: Payer: Self-pay

## 2022-01-14 VITALS — BP 178/90 | HR 63 | Ht 68.0 in | Wt 199.0 lb

## 2022-01-14 DIAGNOSIS — H9313 Tinnitus, bilateral: Secondary | ICD-10-CM | POA: Insufficient documentation

## 2022-01-14 DIAGNOSIS — I10 Essential (primary) hypertension: Secondary | ICD-10-CM | POA: Diagnosis not present

## 2022-01-14 DIAGNOSIS — E1151 Type 2 diabetes mellitus with diabetic peripheral angiopathy without gangrene: Secondary | ICD-10-CM

## 2022-01-14 DIAGNOSIS — N529 Male erectile dysfunction, unspecified: Secondary | ICD-10-CM

## 2022-01-14 LAB — HEMOGLOBIN A1C
Est. average glucose Bld gHb Est-mCnc: 134 mg/dL
Hgb A1c MFr Bld: 6.3 % — ABNORMAL HIGH (ref 4.8–5.6)

## 2022-01-14 LAB — POCT UA - MICROALBUMIN
Albumin/Creatinine Ratio, Urine, POC: <30
Creatinine, POC: 100 mg/dL
Microalbumin Ur, POC: 10 mg/L

## 2022-01-14 NOTE — Assessment & Plan Note (Signed)
Chronic issue for patient Reports taking sildenafil currently - was prescribed by prior PCP Indicates that he is taking 20 mg dose, often taking 2 pills as needed He feels that despite use of sildenafil he will still have ED issues at times This issue for patient is not as much of a priority as compared to above problems Discussed possibility of evaluation with urology, he wishes to hold off for now

## 2022-01-14 NOTE — Assessment & Plan Note (Signed)
Reports chronic issue with tinnitus, feels that ringing has become more noticeable over the past year or so Has had some decreased hearing noted as well - right worse than left - reports that he was told he had hearing loss beginning in the first grade On exam, normal appearing EAC, normal TM, no fluid behind TMs noted.  Small amount of wax within auditory canal. Chronic issue for patient with some worsening over the past year Will refer to ENT for further evaluation and recommendations

## 2022-01-14 NOTE — Assessment & Plan Note (Addendum)
Blood pressure not controlled in office today He reports that this morning before coming in to office, he checked his blood pressure and it was 150s over 80s Current medications include amlodipine, hydrochlorothiazide, lisinopril, atenolol Patient does follow with cardiology, not scheduled to follow-up for about 3 months Denies any current issues with chest pain, headaches Will not make any medication changes today Recommend monitoring blood pressure at home Recommend DASH diet

## 2022-01-14 NOTE — Patient Instructions (Signed)
°  Medication Instructions:  Your physician recommends that you continue on your current medications as directed. Please refer to the Current Medication list given to you today. --If you need a refill on any your medications before your next appointment, please call your pharmacy first. If no refills are authorized on file call the office.-- Lab Work: Your physician has recommended that you have lab work today: A1C and Urine Microalbumin If you have labs (blood work) drawn today and your tests are completely normal, you will receive your results via [place] a phone call from our staff.  Please ensure you check your voicemail in the event that you authorized detailed messages to be left on a delegated number. If you have any lab test that is abnormal or we need to change your treatment, we will call you to review the results.  Referrals/Procedures/Imaging: A referral has been placed for you to an ENT Specialist for evaluation and treatment. Someone from the scheduling department will be in contact with you in regards to coordinating your consultation. If you do not hear from any of the schedulers within 7-10 business days please give their office a call.  Follow-Up: Your next appointment:   Your physician recommends that you schedule a follow-up appointment in: 3 MONTHS for CPE with Dr. de [place]  You will receive a text message or e-mail with a link to a survey about your care and experience with [place] today! We would greatly appreciate your feedback!   Thanks for letting us be apart of your health journey!!  Primary Care and Sports Medicine   Dr. Arlina Robes [place]   We encourage you to activate your patient portal called "MyChart".  Sign up information is provided on this After Visit Summary.  MyChart is used to connect with patients for Virtual Visits (Telemedicine).  Patients are able to view lab/test results, encounter notes, upcoming appointments, etc.  Non-urgent messages can be sent  to your provider as well. To learn more about what you can do with MyChart, please visit --  [email or web address].

## 2022-01-14 NOTE — Progress Notes (Signed)
° ° °  Procedures performed today:    None.  Independent interpretation of notes and tests performed by another provider:   None.  Brief History, Exam, Impression, and Recommendations:    BP (!) 178/90    Pulse 63    Ht 5\' 8"  (1.727 m)    Wt 199 lb (90.3 kg)    SpO2 98%    BMI 30.26 kg/m   Diabetes mellitus with peripheral vascular disease (H[facility]) Recent A1c at target at 6.3% about 3 months ago Continues with lifestyle modifications He would like to check A1c today to monitor Will update A1c today We will also check urine microalbumin/creatinine ratio  Essential hypertension Blood pressure not controlled in office today He reports that this morning before coming in to office, he checked his blood pressure and it was 150s over 80s Current medications include amlodipine, hydrochlorothiazide, lisinopril, atenolol Patient does follow with cardiology, not scheduled to follow-up for about 3 months Denies any current issues with chest pain, headaches Will not make any medication changes today Recommend monitoring blood pressure at home Recommend DASH diet  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  Small amount of wax within auditory canal. Chronic issue for patient with some worsening over the past year Will refer to ENT for further evaluation and recommendations  Erectile dysfunction Chronic issue for patient Reports taking sildenafil currently - was prescribed by prior PCP Indicates that he is taking 20 mg dose, often taking 2 pills as needed He feels that despite use of sildenafil he will still have ED issues at times This issue for patient is not as much of a priority as compared to  above problems Discussed possibility of evaluation with urology, he wishes to hold off for now  Plan for follow-up in 3 months for CPE, will complete labs about 1 week before   ___________________________________________ Qusai Kem de [place], MD, ABFM, CAQSM Primary Care and [place]

## 2022-01-14 NOTE — Assessment & Plan Note (Signed)
Recent A1c at target at 6.3% about 3 months ago Continues with lifestyle modifications He would like to check A1c today to monitor Will update A1c today We will also check urine microalbumin/creatinine ratio

## 2022-01-24 ENCOUNTER — Other Ambulatory Visit (HOSPITAL_BASED_OUTPATIENT_CLINIC_OR_DEPARTMENT_OTHER): Payer: Self-pay | Admitting: Family Medicine

## 2022-01-31 ENCOUNTER — Ambulatory Visit (INDEPENDENT_AMBULATORY_CARE_PROVIDER_SITE_OTHER): Payer: Medicare HMO

## 2022-01-31 ENCOUNTER — Ambulatory Visit
Admission: EM | Admit: 2022-01-31 | Discharge: 2022-01-31 | Disposition: A | Discharge status: Home / Self Care | Payer: Medicare HMO | Attending: Internal Medicine | Admitting: Internal Medicine

## 2022-01-31 ENCOUNTER — Encounter: Payer: Self-pay | Admitting: Emergency Medicine

## 2022-01-31 ENCOUNTER — Other Ambulatory Visit: Payer: Self-pay

## 2022-01-31 DIAGNOSIS — R059 Cough, unspecified: Secondary | ICD-10-CM | POA: Diagnosis not present

## 2022-01-31 DIAGNOSIS — R509 Fever, unspecified: Secondary | ICD-10-CM | POA: Diagnosis not present

## 2022-01-31 DIAGNOSIS — R0989 Other specified symptoms and signs involving the circulatory and respiratory systems: Secondary | ICD-10-CM | POA: Diagnosis not present

## 2022-01-31 DIAGNOSIS — J069 Acute upper respiratory infection, unspecified: Secondary | ICD-10-CM

## 2022-01-31 MED ORDER — BENZONATATE 100 MG PO CAPS
100.0000 mg | ORAL_CAPSULE | Freq: Three times a day (TID) | ORAL | 0 refills | Status: DC | PRN
Start: 2022-01-31 — End: 2022-10-19

## 2022-01-31 MED ORDER — FLUTICASONE PROPIONATE 50 MCG/ACT NA SUSP: 1.0000 | Freq: Every day | NASAL | 0 refills | Status: DC

## 2022-01-31 NOTE — Discharge Instructions (Signed)
You have been prescribed 2 medications to alleviate symptoms.  Please follow-up if symptoms persist or worsen. ?

## 2022-01-31 NOTE — ED Triage Notes (Signed)
Patient c/o productive cough x 1 week that has worsened since yesterday, fever of 102 yesterday, home COVID test was negative.  Patient has been taken Tylenol. ?

## 2022-01-31 NOTE — ED Provider Notes (Signed)
[name]    CSN: [id number] Arrival date & time: 01/31/22  1514      History   Chief Complaint Chief Complaint  Patient presents with   Cough    HPI Vincent Meyers is a 76 y.o. male.   Patient presents with productive cough and nasal congestion that has been present for approximately 1 week.  Patient reports he developed a fever yesterday of 102.  Denies any known sick contacts.  Denies chest pain, shortness of breath, sore throat, ear pain, nausea, vomiting, diarrhea, abdominal pain.  Patient has taken Tylenol with minimal improvement.  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   Cough  Past Medical History:  Diagnosis Date   Diabetes mellitus without complication [facility])    Erectile dysfunction    Hyperlipidemia    Hypertension     Patient Active Problem List   Diagnosis Date Noted   Bilateral tinnitus 01/14/2022   Erectile dysfunction 01/14/2022   COPD (chronic obstructive pulmonary disease) ([place]) 12/07/2021   Atherosclerosis of coronary artery of native heart without angina pectoris 04/30/2021   Hypercholesterolemia 04/30/2021   OSA (obstructive sleep apnea) 11/17/2020   ILD (interstitial lung disease) ([employer]) 11/17/2020   PAD (peripheral artery disease) ([place]) 05/29/2018   Benign prostatic hyperplasia with weak urinary stream 10/17/2017   Diabetes mellitus with peripheral vascular disease ([place]) 05/12/2016   Hyperlipemia, mixed 05/12/2016   Essential hypertension 05/12/2016    Past Surgical History:  Procedure Laterality Date   ILIAC ARTERY ANEURYSM REPAIR Left 2006   Dr. Donnetta Hutching (redo surgery)   REPLACEMENT TOTAL KNEE BILATERAL         Home Medications    Prior to Admission medications   Medication Sig Start Date End Date Taking? Authorizing Provider  albuterol (VENTOLIN HFA) 108 (90 Base) MCG/ACT inhaler Inhale 1-2 puffs into the lungs every 6 (six) hours as needed. 12/07/21  Yes Parrett, Tammy S, NP  aspirin EC 81 MG tablet Take by  mouth.   Yes [provider]  atenolol (TENORMIN) 50 MG tablet Take 1.5 tablets (75 mg total) by mouth daily. 06/29/19  Yes Croitoru, Mihai, MD  benzonatate (TESSALON) 100 MG capsule Take 1 capsule (100 mg total) by mouth every 8 (eight) hours as needed for cough. 01/31/22  Yes Jaana Brodt, [place] E, FNP  Flaxseed, Linseed, (FLAXSEED OIL PO) Take by mouth.   Yes [provider]  fluticasone (FLONASE) 50 MCG/ACT nasal spray Place 1 spray into both nostrils daily for 3 days. 01/31/22 02/03/22 Yes Imaya Duffy, Hildred Alamin E, FNP  hydrochlorothiazide (HYDRODIURIL) 25 MG tablet TAKE 1 TABLET(25 MG) BY MOUTH DAILY 01/24/22  Yes [place], Blondell Reveal, MD  lisinopril (ZESTRIL) 40 MG tablet Take 1 tablet (40 mg total) by mouth daily. 11/17/21  Yes de [place], Raymond J, MD  Magnesium Gluconate 550 MG TABS Take by mouth.   Yes [provider]  Omega-3 1000 MG CAPS Take by mouth.   Yes [provider]  omeprazole (PRILOSEC) 20 MG capsule Take 1 capsule by mouth daily. 06/02/20  Yes [provider]  Probiotic Product (PROBIOTIC-10 PO) Take by mouth.   Yes [provider]  sildenafil (REVATIO) 20 MG tablet Take 20 mg by mouth daily as needed.   Yes [provider]  simvastatin (ZOCOR) 40 MG tablet TAKE 1 TABLET(40 MG) BY MOUTH EVERY NIGHT 10/29/21  Yes [place], Blondell Reveal, MD  Turmeric (QC TUMERIC COMPLEX PO) Take by mouth.   Yes [provider]  amLODipine (NORVASC)  5 MG tablet Take 1 tablet (5 mg total) by mouth daily. 06/12/21 09/28/21  Croitoru, Dani Gobble, MD    Family History Family History  Problem Relation Age of Onset   Heart attack Father    Stroke Father    Alzheimer's disease Father    Stroke Other    Parkinson's disease Other     Social History Social History   Tobacco Use   Smoking status: Former    Packs/day: 2.00    Years: 23.00    Pack years: 46.00    Types: Cigarettes    Start date: 1966    Quit date: 1989    Years since quitting: 34.2    Smokeless tobacco: Never  Vaping Use   Vaping Use: Never used  Substance Use Topics   Alcohol use: Yes    Alcohol/week: 2.0 standard drinks    Types: 2 Standard drinks or equivalent per week   Drug use: Never     Allergies   Patient has no known allergies.   Review of Systems Review of Systems Per HPI  Physical Exam Triage Vital Signs ED Triage Vitals  Enc Vitals Group     BP 01/31/22 1530 (!) 169/76     Pulse Rate 01/31/22 1530 76     Resp 01/31/22 1530 18     Temp 01/31/22 1530 98.1 F (36.7 C)     Temp Source 01/31/22 1530 Oral     SpO2 01/31/22 1530 91 %     Weight --      Height --      Head Circumference --      Peak Flow --      Pain Score 01/31/22 1532 0     Pain Loc --      Pain Edu? --      Excl. in [place]? --    No data found.  Updated Vital Signs BP (!) 169/76 (BP Location: Left Arm)    Pulse 76    Temp 98.1 F (36.7 C) (Oral)    Resp 18    SpO2 97%   Visual Acuity Right Eye Distance:   Left Eye Distance:   Bilateral Distance:    Right Eye Near:   Left Eye Near:    Bilateral Near:     Physical Exam Constitutional:      General: He is not in acute distress.    Appearance: Normal appearance. He is not toxic-appearing or diaphoretic.  HENT:     Head: Normocephalic and atraumatic.     Right Ear: Tympanic membrane and ear canal normal.     Left Ear: Tympanic membrane and ear canal normal.     Nose: Congestion present.     Mouth/Throat:     Mouth: Mucous membranes are moist.     Pharynx: No posterior oropharyngeal erythema.  Eyes:     Extraocular Movements: Extraocular movements intact.     Conjunctiva/sclera: Conjunctivae normal.     Pupils: Pupils are equal, round, and reactive to light.  Cardiovascular:     Rate and Rhythm: Normal rate and regular rhythm.     Pulses: Normal pulses.     Heart sounds: Normal heart sounds.  Pulmonary:     Effort: Pulmonary effort is normal. No respiratory distress.     Breath sounds: Normal breath sounds.  No stridor. No wheezing, rhonchi or rales.     Comments: Crackles to bilateral lung bases. Abdominal:     General: Abdomen is flat. Bowel sounds are normal.  Palpations: Abdomen is soft.  Musculoskeletal:        General: Normal range of motion.     Cervical back: Normal range of motion.  Skin:    General: Skin is warm and dry.  Neurological:     General: No focal deficit present.     Mental Status: He is alert and oriented to person, place, and time. Mental status is at baseline.  Psychiatric:        Mood and Affect: Mood normal.        Behavior: Behavior normal.     UC Treatments / Results  Labs (all labs ordered are listed, but only abnormal results are displayed) Labs Reviewed - No data to display  EKG   Radiology DG Chest 2 View  Result Date: 01/31/2022 CLINICAL DATA:  Adventitious lung sounds, productive cough for 1 week that has worsened since yesterday, fever to 102 degrees yesterday, negative home COVID test EXAM: CHEST - 2 VIEW COMPARISON:  10/29/2019 FINDINGS: Upper normal heart size. Prominent RIGHT hilum unchanged. Mediastinal contours otherwise normal with atherosclerotic calcification aorta. Chronic interstitial lung disease changes at the mid to lower lungs bilaterally, progressive versus prior exam. No acute infiltrate, pleural effusion, or pneumothorax. Osseous structures unremarkable. IMPRESSION: Chronic enlargement of RIGHT hilum, may be due to vascular structures or adenopathy, assessment on prior CTs suboptimal due to lack of contrast. Progression of chronic interstitial lung disease changes/pulmonary fibrosis, greater at bases. No definite acute infiltrate. Aortic Atherosclerosis (ICD10-I70.0). Electronically Signed   By: Lavonia Dana M.D.   On: 01/31/2022 16:01    Procedures Procedures (including critical care time)  Medications Ordered in UC Medications - No data to display  Initial Impression / Assessment and Plan / UC Course  I have reviewed the  triage vital signs and the nursing notes.  Pertinent labs & imaging results that were available during my care of the patient were reviewed by me and considered in my medical decision making (see chart for details).     Patient presents with symptoms likely from a viral upper respiratory infection. Differential includes bacterial pneumonia, sinusitis, allergic rhinitis, COVID-19, flu. Do not suspect underlying cardiopulmonary process. Patient is nontoxic appearing and not in need of emergent medical intervention.  Chest x-ray showed progressive changes and pulmonary fibrosis.  Suspect adventitious lung sounds are related to pulmonary fibrosis.  There is no pneumonia present.  All other x-ray findings were chronic changes and were already noted previously by cardiology or PCP.  Patient notified of results.  Suspect possible viral bronchitis and or inflammation of the chest.  Patient advised that we typically treat with prednisone but there is a safety risk given that he is a heart patient.  Patient wishes to not take any prednisone.  Risks associated with not taking prednisone were discussed with patient.  Patient voiced understanding.  Recommended symptom control with over the counter medications.  Will treat with benzonatate and Flonase.  Return if symptoms fail to improve.  Discussed strict return and ER precautions.  Patient states understanding and is agreeable.  Discharged with PCP followup.  Final Clinical Impressions(s) / UC Diagnoses   Final diagnoses:  Viral upper respiratory tract infection with cough     Discharge Instructions      You have been prescribed 2 medications to alleviate symptoms.  Please follow-up if symptoms persist or worsen.    ED Prescriptions     Medication Sig Dispense Auth. Provider   benzonatate (TESSALON) 100 MG capsule Take 1 capsule (100  mg total) by mouth every 8 (eight) hours as needed for cough. 21 capsule [place], Garten E, [place]   fluticasone  [facility]) 50 MCG/ACT nasal spray Place 1 spray into both nostrils daily for 3 days. 16 g Teodora Medici, [place]      PDMP not reviewed this encounter.   Teodora Medici, [place] 01/31/22 1622

## 2022-02-01 ENCOUNTER — Ambulatory Visit
Admission: EM | Admit: 2022-02-01 | Discharge: 2022-02-01 | Discharge status: Against Medical Advice | Payer: Medicare HMO | Attending: Physician Assistant | Admitting: Physician Assistant

## 2022-02-01 DIAGNOSIS — J069 Acute upper respiratory infection, unspecified: Secondary | ICD-10-CM

## 2022-02-01 MED ORDER — PREDNISONE 20 MG PO TABS: 40.0000 mg | ORAL_TABLET | Freq: Every day | ORAL | 0 refills | Status: DC

## 2022-02-01 MED ORDER — PREDNISONE 20 MG PO TABS: 40.0000 mg | ORAL_TABLET | Freq: Every day | ORAL | 0 refills | Status: AC

## 2022-02-01 NOTE — ED Provider Notes (Signed)
Patient requesting prednisone that was offered at office visit  yesterday. After chart review this does seem appropriate. Will send prescription to pharmacy as requested.  ?  ?Francene Finders, PA-C ?02/01/22 [phone] ? ?

## 2022-02-03 ENCOUNTER — Encounter: Payer: Self-pay | Admitting: Pulmonary Disease

## 2022-02-03 ENCOUNTER — Other Ambulatory Visit: Payer: Self-pay

## 2022-02-03 ENCOUNTER — Ambulatory Visit: Payer: Medicare HMO | Admitting: Pulmonary Disease

## 2022-02-03 VITALS — BP 130/62 | HR 87 | Temp 98.8°F | Ht 68.0 in | Wt 205.2 lb

## 2022-02-03 DIAGNOSIS — J849 Interstitial pulmonary disease, unspecified: Secondary | ICD-10-CM | POA: Diagnosis not present

## 2022-02-03 MED ORDER — AZITHROMYCIN 250 MG PO TABS: ORAL_TABLET | ORAL | 0 refills | Status: DC

## 2022-02-03 MED ORDER — PREDNISONE 10 MG PO TABS
ORAL_TABLET | ORAL | 0 refills | Status: AC
Start: 2022-02-03 — End: 2022-02-19

## 2022-02-03 MED ORDER — BREZTRI AEROSPHERE 160-9-4.8 MCG/ACT IN AERO: 2.0000 | INHALATION_SPRAY | Freq: Two times a day (BID) | RESPIRATORY_TRACT | 0 refills | Status: DC

## 2022-02-03 NOTE — Patient Instructions (Addendum)
Acute ILD Flare ?--START Breztri TWO puffs TWICE a day. (Provide one week sample) ?--CONTINUE Albuterol AS NEEDED for shortness of breath or wheezing ?--START prednisone as instructed ?--START azithromycin as instructed ? ?Follow-up with Dr. Halford Chessman or NP Parrett in one month ? ?

## 2022-02-03 NOTE — Progress Notes (Signed)
? ?'@Patient'$  ID: Vincent Meyers, male    DOB: Jun 04, 1946, 76 y.o.   MRN: [id number] ? ?Chief Complaint  ?Patient presents with  ? Acute Visit  ?  Patient states that he was recently seen at urgent care and was diagnosed with an URI and was given some medications but is not feeling any better. Is supposed to go on a cruise next Tuesday. Voice going in and out. Bad cough worse at night  ? ? ?Referring provider: ?de [place], Blondell Reveal, MD ? ?HPI: ?76 year old male former smoker followed for interstitial lung disease and obstructive sleep apnea ?Former Building control surveyor ? ?TEST/EVENTS :  ?PFT 11/17/20 >> FEV1 2.73 (94%), FEV1% 83, TLC 4.36 (65%), DLCO 67% ?Serology 06/02/21 >> negative ?  ?Chest Imaging:  ?Cardiac CT 07/25/19 >> mild fibrotic changes at bases Rt > Lt ?CT chest 08/24/20 >> UIP pattern ?  ?Sleep Tests:  ?HST 10/25/20 >> AHI 23.4, SpO2 low 78% ?Auto CPAP 08/26/21 to 09/24/21 >> used on 27 of 30 nights with average 8 hrs 46 min.  Average AHI 5.5 with median CPAP 7 and 95 th percentile CPAP 11 cm H2O ? ?02/03/22 ?He was recently seen in UC for URI. CXR 01/31/22 - Increased interstial markings bibasilar, ILD. No infiltrate. He reports 5-7 days sore throat followed by productive cough. He is currently on prednisone 20 mg. Initially felt like it helped on the first day but symptoms worsened. Cough keeps him up at night with yellow sputum. ? ?Last note 11/2021. Recent PFTs stable. Declined antifibrotics ? ?No Known Allergies ? ?Immunization History  ?Administered Date(s) Administered  ? Influenza, High Dose Seasonal PF 10/24/2019  ? Influenza,inj,Quad PF,6-35 Mos 10/11/2016, 10/24/2018  ? Influenza,inj,quad, With Preservative 09/19/2015  ? Influenza,trivalent, recombinat, inj, PF 08/22/2017  ? PFIZER(Purple Top)SARS-COV-2 Vaccination 01/06/2020, 01/29/2020  ? Pneumococcal Conjugate-13 05/19/2016  ? Pneumococcal Polysaccharide-23 09/05/2012  ? Tdap 03/12/2015  ? Zoster, Live 11/05/2013  ? ? ?Past Medical History:  ?Diagnosis Date  ?  Diabetes mellitus without complication ([place])   ? Erectile dysfunction   ? Hyperlipidemia   ? Hypertension   ? ? ?Tobacco History: ?Social History  ? ?Tobacco Use  ?Smoking Status Former  ? Packs/day: 2.00  ? Years: 23.00  ? Pack years: 46.00  ? Types: Cigarettes  ? Start date: 1966  ? Quit date: 1989  ? Years since quitting: 34.2  ?Smokeless Tobacco Never  ? ?Counseling given: Not Answered ? ? ?Outpatient Medications Prior to Visit  ?Medication Sig Dispense Refill  ? albuterol (VENTOLIN HFA) 108 (90 Base) MCG/ACT inhaler Inhale 1-2 puffs into the lungs every 6 (six) hours as needed. 8 g 2  ? amLODipine (NORVASC) 5 MG tablet Take 1 tablet (5 mg total) by mouth daily. 90 tablet 3  ? aspirin EC 81 MG tablet Take by mouth.    ? atenolol (TENORMIN) 50 MG tablet Take 1.5 tablets (75 mg total) by mouth daily. 45 tablet 4  ? benzonatate (TESSALON) 100 MG capsule Take 1 capsule (100 mg total) by mouth every 8 (eight) hours as needed for cough. 21 capsule 0  ? Flaxseed, Linseed, (FLAXSEED OIL PO) Take by mouth.    ? fluticasone (FLONASE) 50 MCG/ACT nasal spray Place 1 spray into both nostrils daily for 3 days. 16 g 0  ? hydrochlorothiazide (HYDRODIURIL) 25 MG tablet TAKE 1 TABLET(25 MG) BY MOUTH DAILY 90 tablet 0  ? lisinopril (ZESTRIL) 40 MG tablet Take 1 tablet (40 mg total) by mouth daily. 30 tablet 2  ?  Magnesium Gluconate 550 MG TABS Take by mouth.    ? Omega-3 1000 MG CAPS Take by mouth.    ? omeprazole (PRILOSEC) 20 MG capsule Take 1 capsule by mouth daily.    ? predniSONE (DELTASONE) 20 MG tablet Take 2 tablets (40 mg total) by mouth daily with breakfast for 5 days. 10 tablet 0  ? Probiotic Product (PROBIOTIC-10 PO) Take by mouth.    ? sildenafil (REVATIO) 20 MG tablet Take 20 mg by mouth daily as needed.    ? simvastatin (ZOCOR) 40 MG tablet TAKE 1 TABLET(40 MG) BY MOUTH EVERY NIGHT 90 tablet 0  ? Turmeric (QC TUMERIC COMPLEX PO) Take by mouth.    ? ?No facility-administered medications prior to visit.  ? ?ROS per  HPI ? ?Physical Exam ? ?BP 130/62 (BP Location: Left Arm, Patient Position: Sitting, Cuff Size: Normal)   Pulse 87   Temp 98.8 ?F (37.1 ?C) (Oral)   Ht '5\' 8"'$  (1.727 m)   Wt 205 lb 3.2 oz (93.1 kg)   SpO2 95%   BMI 31.20 kg/m?  ? ?Physical Exam: ?General: Chronically ill-appearing, no acute distress ?HENT: , AT. Difficulty hearing ?Eyes: EOMI, no scleral icterus ?Respiratory: Bibasilar inspiratory crackles and coarse breath sounds ?Cardiovascular: RRR, -M/R/G, no JVD ?Extremities:-Edema,-tenderness ?Neuro: AAO x4, CNII-XII grossly intact ?Psych: Normal mood, normal affect ? ?Lab Results: ? ?CBC ?No results found for: WBC, RBC, HGB, HCT, PLT, MCV, MCH, MCHC, RDW, LYMPHSABS, MONOABS, EOSABS, BASOSABS ? ? ? ?BNP ?No results found for: BNP ? ?ProBNP ?No results found for: PROBNP ? ?Imaging: ?DG Chest 2 View ? ?Result Date: 01/31/2022 ?CLINICAL DATA:  Adventitious lung sounds, productive cough for 1 week that has worsened since yesterday, fever to 102 degrees yesterday, negative home COVID test EXAM: CHEST - 2 VIEW COMPARISON:  10/29/2019 FINDINGS: Upper normal heart size. Prominent RIGHT hilum unchanged. Mediastinal contours otherwise normal with atherosclerotic calcification aorta. Chronic interstitial lung disease changes at the mid to lower lungs bilaterally, progressive versus prior exam. No acute infiltrate, pleural effusion, or pneumothorax. Osseous structures unremarkable. IMPRESSION: Chronic enlargement of RIGHT hilum, may be due to vascular structures or adenopathy, assessment on prior CTs suboptimal due to lack of contrast. Progression of chronic interstitial lung disease changes/pulmonary fibrosis, greater at bases. No definite acute infiltrate. Aortic Atherosclerosis (ICD10-I70.0). Electronically Signed   By: Lavonia Dana M.D.   On: 01/31/2022 16:01   ? ? ? ?PFT Results Latest Ref Rng & Units 11/26/2021 11/17/2020  ?FVC-Pre L 3.27 3.28  ?FVC-Predicted Pre % 83 82  ?FVC-Post L 3.31 3.16  ?FVC-Predicted  Post % 83 79  ?Pre FEV1/FVC % % 68 83  ?Post FEV1/FCV % % 87 85  ?FEV1-Pre L 2.22 2.73  ?FEV1-Predicted Pre % 77 94  ?FEV1-Post L 2.89 2.69  ?DLCO uncorrected ml/min/mmHg 15.89 16.02  ?DLCO UNC% % 66 67  ?DLCO corrected ml/min/mmHg 15.89 16.02  ?DLCO COR %Predicted % 66 67  ?DLVA Predicted % 94 88  ?TLC L 4.78 4.36  ?TLC % Predicted % 72 65  ?RV % Predicted % 62 47  ? ? ?No results found for: NITRICOXIDE ? ? ? ? ? ?Assessment & Plan:  ? ?No problem-specific Assessment & Plan notes found for this encounter. ?76 year old male with ILD, COPD, OSA who presents with acute bronchitis/ILD flare. ? ?Acute ILD Flare ?--START Breztri TWO puffs TWICE a day. (Provide one week sample) ?--CONTINUE Albuterol AS NEEDED for shortness of breath or wheezing ?--START prednisone as instructed ?--START azithromycin  as instructed ? ? ?Follow-up with Dr. Halford Chessman or NP Parrett in one month ? ?Solenne Manwarren Rodman Pickle, MD ?02/03/2022 ? ?

## 2022-02-03 NOTE — Progress Notes (Signed)
Patient seen in the office today and instructed on use of Breztri.  Patient expressed understanding and demonstrated technique.  

## 2022-02-05 ENCOUNTER — Encounter: Payer: Self-pay | Admitting: Pulmonary Disease

## 2022-02-05 MED ORDER — PROMETHAZINE-DM 6.25-15 MG/5ML PO SYRP: 5.0000 mL | ORAL_SOLUTION | Freq: Four times a day (QID) | ORAL | 0 refills | Status: DC | PRN

## 2022-02-05 NOTE — Telephone Encounter (Signed)
Changed cough syrup rx to patient's preferred pharmacy. ?

## 2022-02-05 NOTE — Addendum Note (Signed)
Addended by: Rodman Pickle on: 02/05/2022 05:11 PM ? ? Modules accepted: Orders ? ?

## 2022-02-05 NOTE — Telephone Encounter (Signed)
Cough syrup ordered. Messaged patient. ? ?Please forward any further questions to his primary pulmonologist, Dr. Halford Chessman. ?

## 2022-02-09 ENCOUNTER — Encounter: Payer: Self-pay | Admitting: Pulmonary Disease

## 2022-03-09 ENCOUNTER — Ambulatory Visit: Payer: Medicare HMO | Admitting: Adult Health

## 2022-04-05 ENCOUNTER — Ambulatory Visit (HOSPITAL_BASED_OUTPATIENT_CLINIC_OR_DEPARTMENT_OTHER): Payer: Medicare HMO

## 2022-04-07 ENCOUNTER — Ambulatory Visit (HOSPITAL_BASED_OUTPATIENT_CLINIC_OR_DEPARTMENT_OTHER): Payer: Medicare HMO

## 2022-04-08 ENCOUNTER — Ambulatory Visit (HOSPITAL_BASED_OUTPATIENT_CLINIC_OR_DEPARTMENT_OTHER): Payer: Medicare HMO

## 2022-04-08 ENCOUNTER — Other Ambulatory Visit (HOSPITAL_BASED_OUTPATIENT_CLINIC_OR_DEPARTMENT_OTHER): Payer: Self-pay

## 2022-04-08 ENCOUNTER — Other Ambulatory Visit (HOSPITAL_BASED_OUTPATIENT_CLINIC_OR_DEPARTMENT_OTHER): Payer: Self-pay | Admitting: Family Medicine

## 2022-04-08 DIAGNOSIS — Z0000 Encounter for general adult medical examination without abnormal findings: Secondary | ICD-10-CM

## 2022-04-09 LAB — CBC WITH DIFFERENTIAL/PLATELET
Basophils Absolute: 0.1 10*3/uL (ref 0.0–0.2)
Basos: 1 %
EOS (ABSOLUTE): 0.4 10*3/uL (ref 0.0–0.4)
Eos: 4 %
Hematocrit: 45.8 % (ref 37.5–51.0)
Hemoglobin: 15.1 g/dL (ref 13.0–17.7)
Immature Grans (Abs): 0.1 10*3/uL (ref 0.0–0.1)
Immature Granulocytes: 1 %
Lymphocytes Absolute: 1.4 10*3/uL (ref 0.7–3.1)
Lymphs: 14 %
MCH: 27 pg (ref 26.6–33.0)
MCHC: 33 g/dL (ref 31.5–35.7)
MCV: 82 fL (ref 79–97)
Monocytes Absolute: 0.8 10*3/uL (ref 0.1–0.9)
Monocytes: 8 %
Neutrophils Absolute: 7.5 10*3/uL — ABNORMAL HIGH (ref 1.4–7.0)
Neutrophils: 72 %
Platelets: 212 10*3/uL (ref 150–450)
RBC: 5.59 x10E6/uL (ref 4.14–5.80)
RDW: 13.9 % (ref 11.6–15.4)
WBC: 10.3 10*3/uL (ref 3.4–10.8)

## 2022-04-09 LAB — COMPREHENSIVE METABOLIC PANEL
ALT: 20 IU/L (ref 0–44)
AST: 24 IU/L (ref 0–40)
Albumin/Globulin Ratio: 1.6 (ref 1.2–2.2)
Albumin: 4.2 g/dL (ref 3.7–4.7)
Alkaline Phosphatase: 68 IU/L (ref 44–121)
BUN/Creatinine Ratio: 12 (ref 10–24)
BUN: 7 mg/dL — ABNORMAL LOW (ref 8–27)
Bilirubin Total: 0.4 mg/dL (ref 0.0–1.2)
CO2: 25 mmol/L (ref 20–29)
Calcium: 9.1 mg/dL (ref 8.6–10.2)
Chloride: 97 mmol/L (ref 96–106)
Creatinine, Ser: 0.57 mg/dL — ABNORMAL LOW (ref 0.76–1.27)
Globulin, Total: 2.7 g/dL (ref 1.5–4.5)
Glucose: 159 mg/dL — ABNORMAL HIGH (ref 70–99)
Potassium: 4.3 mmol/L (ref 3.5–5.2)
Sodium: 136 mmol/L (ref 134–144)
Total Protein: 6.9 g/dL (ref 6.0–8.5)
eGFR: 103 mL/min/{1.73_m2} (ref >59)

## 2022-04-09 LAB — LIPID PANEL
Chol/HDL Ratio: 3.4 ratio (ref 0.0–5.0)
Cholesterol, Total: 106 mg/dL (ref 100–199)
HDL: 31 mg/dL — ABNORMAL LOW (ref >39)
LDL Chol Calc (NIH): 56 mg/dL (ref 0–99)
Triglycerides: 101 mg/dL (ref 0–149)
VLDL Cholesterol Cal: 19 mg/dL (ref 5–40)

## 2022-04-09 LAB — HEMOGLOBIN A1C
Est. average glucose Bld gHb Est-mCnc: 134 mg/dL
Hgb A1c MFr Bld: 6.3 % — ABNORMAL HIGH (ref 4.8–5.6)

## 2022-04-09 LAB — TSH RFX ON ABNORMAL TO FREE T4: TSH: 2.31 u[IU]/mL (ref 0.450–4.500)

## 2022-04-12 ENCOUNTER — Encounter (HOSPITAL_BASED_OUTPATIENT_CLINIC_OR_DEPARTMENT_OTHER): Payer: Medicare HMO | Admitting: Family Medicine

## 2022-04-13 ENCOUNTER — Ambulatory Visit (INDEPENDENT_AMBULATORY_CARE_PROVIDER_SITE_OTHER): Payer: Medicare HMO | Admitting: Family Medicine

## 2022-04-13 ENCOUNTER — Encounter (HOSPITAL_BASED_OUTPATIENT_CLINIC_OR_DEPARTMENT_OTHER): Payer: Self-pay | Admitting: Family Medicine

## 2022-04-13 DIAGNOSIS — Z23 Encounter for immunization: Secondary | ICD-10-CM

## 2022-04-13 DIAGNOSIS — Z0000 Encounter for general adult medical examination without abnormal findings: Secondary | ICD-10-CM | POA: Diagnosis not present

## 2022-04-13 DIAGNOSIS — H9313 Tinnitus, bilateral: Secondary | ICD-10-CM

## 2022-04-13 MED ORDER — SHINGRIX 50 MCG/0.5ML IM SUSR: 0.5000 mL | Freq: Once | INTRAMUSCULAR | 0 refills | Status: AC

## 2022-04-13 MED ORDER — SILDENAFIL CITRATE 20 MG PO TABS
20.0000 mg | ORAL_TABLET | Freq: Every day | ORAL | 0 refills | Status: AC | PRN
Start: 2022-04-13 — End: ?

## 2022-04-13 NOTE — Progress Notes (Signed)
Subjective:    CC: Annual Physical Exam  HPI:  Vincent Meyers is a 76 y.o. presenting for annual physical  I reviewed the past medical history, family history, social history, surgical history, and allergies today and no changes were needed.  Please see the problem list section below in epic for further details.  Past Medical History: Past Medical History:  Diagnosis Date   Diabetes mellitus without complication ([place])    Erectile dysfunction    Hyperlipidemia    Hypertension    Past Surgical History: Past Surgical History:  Procedure Laterality Date   ILIAC ARTERY ANEURYSM REPAIR Left 2006   Dr. Donnetta Hutching (redo surgery)   REPLACEMENT TOTAL KNEE BILATERAL     Social History: Social History   Socioeconomic History   Marital status: Widowed    Spouse name: Not on file   Number of children: Not on file   Years of education: Not on file   Highest education level: Not on file  Occupational History   Not on file  Tobacco Use   Smoking status: Former    Packs/day: 2.00    Years: 23.00    Pack years: 46.00    Types: Cigarettes    Start date: 58    Quit date: 1989    Years since quitting: 34.4   Smokeless tobacco: Never  Vaping Use   Vaping Use: Never used  Substance and Sexual Activity   Alcohol use: Yes    Alcohol/week: 2.0 standard drinks    Types: 2 Standard drinks or equivalent per week   Drug use: Never   Sexual activity: Yes  Other Topics Concern   Not on file  Social History Narrative   Not on file   Social Determinants of Health   Financial Resource Strain: Not on file  Food Insecurity: Not on file  Transportation Needs: Not on file  Physical Activity: Not on file  Stress: Not on file  Social Connections: Not on file   Family History: Family History  Problem Relation Age of Onset   Heart attack Father    Stroke Father    Alzheimer's disease Father    Stroke Other    Parkinson's disease Other    Allergies: No Known Allergies Medications: See  med rec.  Review of Systems: No headache, visual changes, nausea, vomiting, diarrhea, constipation, dizziness, abdominal pain, skin rash, fevers, chills, night sweats, swollen lymph nodes, weight loss, chest pain, body aches, joint swelling, muscle aches, shortness of breath, mood changes, visual or auditory hallucinations.  Objective:    BP (!) 161/70   Pulse 64   Ht '5\' 8"'$  (1.727 m)   Wt 204 lb 12.8 oz (92.9 kg)   SpO2 98%   BMI 31.14 kg/m   General: Well Developed, well nourished, and in no acute distress.  Neuro: Alert and oriented x3, extra-ocular muscles intact, sensation grossly intact. Cranial nerves II through XII are intact, motor, sensory, and coordinative functions are all intact. HEENT: Normocephalic, atraumatic, pupils equal round reactive to light, neck supple, no masses, no lymphadenopathy, thyroid nonpalpable. Oropharynx, nasopharynx, external ear canals are unremarkable. Skin: Warm and dry, no rashes noted.  Cardiac: Regular rate and rhythm, no murmurs rubs or gallops.  Respiratory: Clear to auscultation bilaterally. Not using accessory muscles, speaking in full sentences.  Abdominal: Soft, nontender, nondistended, positive bowel sounds, no masses, no organomegaly.  Musculoskeletal: Shoulder, elbow, wrist, hip, knee, ankle stable, and with full range of motion.  Impression and Recommendations:    Wellness examination Routine  HCM labs reviewed. HCM reviewed/discussed. Anticipatory guidance regarding healthy weight, lifestyle and choices given. Recommend healthy diet.  Recommend approximately 150 minutes/week of moderate intensity exercise Recommend regular dental and vision exams Always use seatbelt/lap and shoulder restraints Recommend using smoke alarms and checking batteries at least twice a year Recommend using sunscreen when outside Discussed colon cancer screening recommendations, options.  Patient thinks he is UTD, he will contact his GI doctor who has performed  past colonoscopies to check with them Discussed recommendations for shingles vaccine.  Patient interested, Rx sent to pharmacy Discussed tetanus immunization recommendations, patient is UTD  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  Small amount of wax within auditory canal. Chronic issue for patient with some worsening over the past year Referral to Audiologist placed  Plan for follow-up in 6 months   ___________________________________________ Saron Tweed de [place], MD, ABFM, [facility] and [place]

## 2022-04-13 NOTE — Assessment & Plan Note (Addendum)
Routine HCM labs reviewed. HCM reviewed/discussed. Anticipatory guidance regarding healthy weight, lifestyle and choices given. Recommend healthy diet.  Recommend approximately 150 minutes/week of moderate intensity exercise Recommend regular dental and vision exams Always use seatbelt/lap and shoulder restraints Recommend using smoke alarms and checking batteries at least twice a year Recommend using sunscreen when outside Discussed colon cancer screening recommendations, options.  Patient thinks he is UTD, he will contact his GI doctor who has performed past colonoscopies to check with them Discussed recommendations for shingles vaccine.  Patient interested, Rx sent to pharmacy Discussed tetanus immunization recommendations, patient is UTD

## 2022-04-13 NOTE — Assessment & Plan Note (Addendum)
Reports chronic issue with tinnitus, feels that ringing has become more noticeable over the past year or so Has had some decreased hearing noted as well - right worse than left - reports that he was told he had hearing loss beginning in the first grade On exam, normal appearing EAC, normal TM, no fluid behind TMs noted.  Small amount of wax within auditory canal. Chronic issue for patient with some worsening over the past year Referral to Audiologist placed

## 2022-04-14 ENCOUNTER — Other Ambulatory Visit (HOSPITAL_BASED_OUTPATIENT_CLINIC_OR_DEPARTMENT_OTHER): Payer: Self-pay | Admitting: Family Medicine

## 2022-04-21 ENCOUNTER — Ambulatory Visit: Payer: Medicare HMO | Admitting: Audiologist

## 2022-05-06 ENCOUNTER — Ambulatory Visit: Payer: Medicare HMO | Attending: Audiologist | Admitting: Audiologist

## 2022-05-06 DIAGNOSIS — H9313 Tinnitus, bilateral: Secondary | ICD-10-CM | POA: Insufficient documentation

## 2022-05-06 DIAGNOSIS — H90A22 Sensorineural hearing loss, unilateral, left ear, with restricted hearing on the contralateral side: Secondary | ICD-10-CM | POA: Insufficient documentation

## 2022-05-06 DIAGNOSIS — H90A31 Mixed conductive and sensorineural hearing loss, unilateral, right ear with restricted hearing on the contralateral side: Secondary | ICD-10-CM | POA: Insufficient documentation

## 2022-05-06 NOTE — Procedures (Signed)
Outpatient Audiology and [place], [place]  [address] [phone]  AUDIOLOGICAL  EVALUATION  NAME: EAVEN SCHWAGER     DOB:   [birth year]      MRN: [id number]                                                                                     DATE: 05/06/2022     REFERENT: de [place], Blondell Reveal, MD STATUS: Outpatient DIAGNOSIS: Mixed Hearing Loss Right Ear, Sensorineural Hearing Loss Left Ear     History: Mutasim was seen for an audiological evaluation. Sriman has had hearing loss since he was around [age]. He says he was treated with radium as a child but is not sure if it helped his hearing. He is having a harder and harder time hearing. He had an infection that caused his right ear eustachian tube to stop working properly. When the right ear opens up, he hears much better for a brief period of time.  Uriah has difficulty at all times unless someone is face to face. Rhylee has worn hearing aids, they were programmed at [facility]. He could hear all the high pitched sounds, like birds, but still could not understand voices. Tinnitus present in both ears sounding like a hiss. It does not keep him from sleeping. The tinnitus is aggravated by similar sounding noises, such as the hum of his camper. Savan was followed by an ENT as a child but has not seen one recently. Kavi was diabetes which is a risk factor for hearing loss. No other relevant case history reported.   Evaluation:  Otoscopy showed a clear view of the tympanic membranes, bilaterally Tympanometry results were consistent with flat response in the right ear and negative pressure in the left ear  Audiometric testing was completed using conventional audiometry with insert transducer. Speech Recognition Thresholds were consistent with pure tone averages, SRT 55dB in the right ear and 40dB in the left ear. Word Recognition was performed at 40dB SL with masking, scores of 68% in the right ear and 100% in the  left ear. Pure tone thresholds show normal sloping to moderate sensorineural hearing loss in the left ear and normal sloping to severe mixed hearing loss in the right ear. Test results are consistent with conductive component in right ear, 500-4kHz. Bone thresholds symmetric.   Results:  The test results were reviewed with Mikeal Hawthorne. He has a mixed hearing loss in the right ear and a sensorineural  hearing loss in the left ear. I recommend starting with ENT referral since Stanislaus's right middle ear is impacting his hearing. The hearing aids he used previously at [facility] may not have had sufficient gain or programming. Recommend he try another pair fit by an audiologist since he has a difficult hearing loss to fit. He reported understanding, and wants to start with seeing Otolaryngology.    Recommendations: Referral to ENT Physician necessary due to conductive component to hearing loss in right ear. de [place], Blondell Reveal, MD please place a referral to Otolaryngology due to right ear hearing loss and persistent abnormal middle ear function.  Hearing aids recommended after seeing Otolaryngology, recommend seeing audiologist  for hearing aids.    39 minutes spent testing and counseling on results.   Alfonse Alpers  Audiologist, Au.D., CCC-A 05/06/2022  1:36 PM  Cc: de [place], Raymond J, MD

## 2022-05-24 ENCOUNTER — Ambulatory Visit (INDEPENDENT_AMBULATORY_CARE_PROVIDER_SITE_OTHER): Payer: Medicare HMO | Admitting: Family Medicine

## 2022-05-24 DIAGNOSIS — H9313 Tinnitus, bilateral: Secondary | ICD-10-CM | POA: Diagnosis not present

## 2022-05-24 DIAGNOSIS — H902 Conductive hearing loss, unspecified: Secondary | ICD-10-CM | POA: Diagnosis not present

## 2022-05-24 MED ORDER — LISINOPRIL 40 MG PO TABS
40.0000 mg | ORAL_TABLET | Freq: Every day | ORAL | 1 refills | Status: DC
Start: 2022-05-24 — End: 2022-12-02

## 2022-05-24 NOTE — Progress Notes (Signed)
   Virtual Visit via Telephone   I connected with  Vincent Meyers  on 05/24/22 by telephone/telehealth and verified that I am speaking with the correct person using two identifiers.   I discussed the limitations, risks, security and privacy concerns of performing an evaluation and management service by telephone, including the higher likelihood of inaccurate diagnosis and treatment, and the availability of in person appointments.  We also discussed the likely need of an additional face to face encounter for complete and high quality delivery of care.  I also discussed with the patient that there may be a patient responsible charge related to this service. The patient expressed understanding and wishes to proceed.  Provider location is in medical facility. Patient location is at their home, different from provider location. People involved in care of the patient during this telehealth encounter were myself, my nurse/medical assistant, and my front office/scheduling team member.  Review of Systems: No fevers, chills, night sweats, weight loss, chest pain, or shortness of breath.   Objective Findings:    General: Speaking full sentences, no audible heavy breathing.  Sounds alert and appropriately interactive.    Independent interpretation of tests performed by another provider:   None.  Brief History, Exam, Impression, and Recommendations:    Bilateral tinnitus Recently had evaluation with audiologist.  Findings at time did indicate conductive hearing loss primarily in the right ear for which recommendation was further evaluation with ENT and then proceeding with hearing aids as fitted by audiology.  He is requesting referral today to have evaluation with ENT Referral placed to ENT.  Advised to contact our office if he does not hear from specialist in the next 1 to 2 weeks  Request refill of lisinopril today, refill provided  I discussed the above assessment and treatment plan with the  patient. The patient was provided an opportunity to ask questions and all were answered. The patient agreed with the plan and demonstrated an understanding of the instructions.   The patient was advised to call back or seek an in-person evaluation if the symptoms worsen or if the condition fails to improve as anticipated.   I provided 10 minutes of face to face and non-face-to-face time during this encounter date, time was needed to gather information, review chart, records, communicate/coordinate with staff remotely, as well as complete documentation.   ___________________________________________ Armistead Sult de [place], MD, ABFM, CAQSM Primary Care and [place]

## 2022-05-24 NOTE — Assessment & Plan Note (Signed)
Recently had evaluation with audiologist.  Findings at time did indicate conductive hearing loss primarily in the right ear for which recommendation was further evaluation with ENT and then proceeding with hearing aids as fitted by audiology.  He is requesting referral today to have evaluation with ENT Referral placed to ENT.  Advised to contact our office if he does not hear from specialist in the next 1 to 2 weeks

## 2022-05-26 ENCOUNTER — Other Ambulatory Visit (HOSPITAL_BASED_OUTPATIENT_CLINIC_OR_DEPARTMENT_OTHER): Payer: Self-pay | Admitting: Family Medicine

## 2022-06-23 ENCOUNTER — Other Ambulatory Visit: Payer: Self-pay

## 2022-06-23 MED ORDER — AMLODIPINE BESYLATE 5 MG PO TABS: 5.0000 mg | ORAL_TABLET | Freq: Every day | ORAL | 0 refills | Status: DC

## 2022-06-23 MED ORDER — HYDROCHLOROTHIAZIDE 25 MG PO TABS: ORAL_TABLET | ORAL | 0 refills | Status: DC

## 2022-06-23 NOTE — Telephone Encounter (Signed)
Rx(s) sent to pharmacy electronically.  

## 2022-08-09 LAB — HM DIABETES EYE EXAM

## 2022-08-18 ENCOUNTER — Telehealth (HOSPITAL_BASED_OUTPATIENT_CLINIC_OR_DEPARTMENT_OTHER): Payer: Self-pay | Admitting: Family Medicine

## 2022-08-18 ENCOUNTER — Other Ambulatory Visit (HOSPITAL_BASED_OUTPATIENT_CLINIC_OR_DEPARTMENT_OTHER): Payer: Self-pay

## 2022-08-18 DIAGNOSIS — E782 Mixed hyperlipidemia: Secondary | ICD-10-CM

## 2022-08-18 DIAGNOSIS — I10 Essential (primary) hypertension: Secondary | ICD-10-CM

## 2022-08-18 MED ORDER — ATENOLOL 50 MG PO TABS: 75.0000 mg | ORAL_TABLET | Freq: Every day | ORAL | 4 refills | Status: DC

## 2022-08-18 MED ORDER — SIMVASTATIN 40 MG PO TABS: ORAL_TABLET | ORAL | 1 refills | Status: DC

## 2022-08-18 NOTE — Telephone Encounter (Signed)
Pt was called and didn't answer. I have refilled his Zocor 40 mg and Tenormin [facility] mg to [facility] [address]

## 2022-08-18 NOTE — Telephone Encounter (Signed)
Patient needs medication on the below meds.  Needs refills sent to [facility] [address]  simvastatin (ZOCOR) 40 MG tablet [[id number]]    Order Details Dose, Route, Frequency: As Directed  Dispense Quantity: 90 tablet Refills: 0        Sig: TAKE 1 TABLET(40 MG) BY MOUTH EVERY NIGHT       Start Date: 04/14/22 End Date: --  Written Date: 04/14/22 Expiration Date: 04/14/23  Original Order:  simvastatin (ZOCOR) 40 MG tablet [[id number]]  Providers atenolol (TENORMIN) 50 MG tablet [[id number]]    Order Details Dose: 75 mg Route: Oral Frequency: Daily  Dispense Quantity: 45 tablet Refills: 4        Sig: Take 1.5 tablets (75 mg total) by mouth daily.       Start Date: 06/29/19 End Date: --  Written Date: 06/29/19 Expiration Date: 06/28/20  Original Order:  atenolol (TENORMIN) 50 MG tablet [[id number]]

## 2022-09-05 ENCOUNTER — Other Ambulatory Visit: Payer: Self-pay | Admitting: Cardiovascular Disease

## 2022-09-09 ENCOUNTER — Other Ambulatory Visit (HOSPITAL_BASED_OUTPATIENT_CLINIC_OR_DEPARTMENT_OTHER): Payer: Self-pay

## 2022-09-09 DIAGNOSIS — K219 Gastro-esophageal reflux disease without esophagitis: Secondary | ICD-10-CM

## 2022-09-09 MED ORDER — OMEPRAZOLE 20 MG PO CPDR: 20.0000 mg | DELAYED_RELEASE_CAPSULE | Freq: Every day | ORAL | 1 refills | Status: DC

## 2022-09-23 ENCOUNTER — Encounter: Payer: Self-pay | Admitting: Cardiovascular Disease

## 2022-09-28 ENCOUNTER — Encounter: Payer: Self-pay | Admitting: Physician Assistant

## 2022-09-28 ENCOUNTER — Ambulatory Visit: Payer: Medicare HMO | Attending: Nurse Practitioner | Admitting: Physician Assistant

## 2022-09-28 VITALS — BP 138/62 | HR 66 | Ht 68.0 in | Wt 207.6 lb

## 2022-09-28 DIAGNOSIS — I10 Essential (primary) hypertension: Secondary | ICD-10-CM | POA: Diagnosis not present

## 2022-09-28 DIAGNOSIS — E119 Type 2 diabetes mellitus without complications: Secondary | ICD-10-CM

## 2022-09-28 DIAGNOSIS — I2510 Atherosclerotic heart disease of native coronary artery without angina pectoris: Secondary | ICD-10-CM

## 2022-09-28 DIAGNOSIS — R0602 Shortness of breath: Secondary | ICD-10-CM

## 2022-09-28 DIAGNOSIS — E785 Hyperlipidemia, unspecified: Secondary | ICD-10-CM

## 2022-09-28 NOTE — Patient Instructions (Signed)
Medication Instructions:  Your physician recommends that you continue on your current medications as directed. Please refer to the Current Medication list given to you today.  *If you need a refill on your cardiac medications before your next appointment, please call your pharmacy*  Lab Work: NONE ordered at this time of appointment   If you have labs (blood work) drawn today and your tests are completely normal, you will receive your results only by: [place] (if you have MyChart) OR A paper copy in the mail If you have any lab test that is abnormal or we need to change your treatment, we will call you to review the results.  Testing/Procedures: Your physician has requested that you have an echocardiogram. Echocardiography is a painless test that uses sound waves to create images of your heart. It provides your doctor with information about the size and shape of your heart and how well your heart's chambers and valves are working. This procedure takes approximately one hour. There are no restrictions for this procedure. Please do NOT wear cologne, perfume, aftershave, or lotions (deodorant is allowed). Please arrive 15 minutes prior to your appointment time.   Follow-Up: At [facility], you and your health needs are our priority.  As part of our continuing mission to provide you with exceptional heart care, we have created designated Provider Care Teams.  These Care Teams include your primary Cardiologist (physician) and Advanced Practice Providers (APPs -  Physician Assistants and Nurse Practitioners) who all work together to provide you with the care you need, when you need it.   Your next appointment:   6 month(s)  The format for your next appointment:   In Person  Provider:   Sanda Klein, MD     Other Instructions  Important Information About Sugar

## 2022-09-28 NOTE — Progress Notes (Unsigned)
Cardiology Office Note:    Date:  09/29/2022   ID:  EARNESTINE TUOHEY, DOB [birth year], MRN [id number]  PCP:  de [place], Raymond J, MD   [place] Providers Cardiologist:  Sanda Klein, MD     Referring MD: de [place], Blondell Reveal, MD   Chief Complaint  Patient presents with   Follow-up    Seen for Dr. Sallyanne Kuster    History of Present Illness:    Vincent Meyers is a 76 y.o. male with a hx of CAD, PAD, HTN, HLD, DM II, OSA on CPAP and pulmonary fibrosis.  He has previously underwent intervention by Dr. Donnetta Hutching.  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  A coronary CT obtained on 07/24/2019 showed coronary calcium score in the 96 percentile, 50 to 60% mid to distal RCA, greater than 70% PDA, 24 to 49% left main, 50 to 69% proximal to mid LAD, greater than 75% D1 lesion.  FFR showed significant stenosis in PDA and ostial D1.  He was treated medically since majority of the severe disease was seen in the distal vessel with exception of D1.  He has chronic stable angina.  Previous high-resolution CT obtained on 10/30/2021 CT showed interstitial pneumonia, with clear progression compared to the previous examination, aortic atherosclerosis including left main and three-vessel CAD.  Last PFT obtained in January 2023 showed FEV1 77%, FVC 83%.  Patient presents today for delayed 1 year follow-up.  He is primary concern has been worsening dyspnea on exertion.  I walked the patient around the office for 2 laps, roughly about 75 yards.  His O2 saturation at the beginning of the walk was 96%, at the end of the walk it was 90%.  He has fairly good functional ability despite complaint of dyspnea on exertion.  He denies any chest pain.  He mentions sometimes after strenuous activity, his girlfriend will notice his O2 saturation dropped below 90%.  We did not see this during the office visit today.  His EKG does  not show any significant ischemic changes.  I discussed his case with DOD Dr. Phineas Inches, I recommended echocardiogram given the dyspnea on exertion, however I suspect his pulmonary issue is the primary driver behind the shortness of breath with exertion.  I did recommend a 43-monthfollow-up for earlier visit next time.   Past Medical History:  Diagnosis Date   Diabetes mellitus without complication [place]    Erectile dysfunction    Hyperlipidemia    Hypertension     Past Surgical History:  Procedure Laterality Date   ILIAC ARTERY ANEURYSM REPAIR Left 2006   Dr. EDonnetta Hutching(redo surgery)   REPLACEMENT TOTAL KNEE BILATERAL      Current Medications: Current Meds  Medication Sig   albuterol (VENTOLIN HFA) 108 (90 Base) MCG/ACT inhaler Inhale 1-2 puffs into the lungs every 6 (six) hours as needed.   amLODipine (NORVASC) 5 MG tablet TAKE 1 TABLET (5 MG TOTAL) BY MOUTH DAILY. NEED APPOINTMENT   aspirin EC 81 MG tablet Take by mouth.   atenolol (TENORMIN) 50 MG tablet Take 1.5 tablets (75 mg total) by mouth daily.   Flaxseed, Linseed, (FLAXSEED OIL PO) Take by mouth.   lisinopril (ZESTRIL) 40 MG tablet Take 1 tablet (40 mg total) by mouth daily.   Magnesium Gluconate 550 MG TABS Take by mouth.   omeprazole (PRILOSEC) 20 MG capsule Take 1 capsule (20  mg total) by mouth daily.   sildenafil (REVATIO) 20 MG tablet Take 1 tablet (20 mg total) by mouth daily as needed.   simvastatin (ZOCOR) 40 MG tablet TAKE 1 TABLET(40 MG) BY MOUTH EVERY NIGHT   Turmeric (QC TUMERIC COMPLEX PO) Take by mouth.   [DISCONTINUED] hydrochlorothiazide (HYDRODIURIL) 25 MG tablet TAKE 1 TABLET(25 MG) BY MOUTH DAILY     Allergies:   Patient has no known allergies.   Social History   Socioeconomic History   Marital status: Widowed    Spouse name: Not on file   Number of children: Not on file   Years of education: Not on file   Highest education level: Not on file  Occupational History   Not on file  Tobacco Use    Smoking status: Former    Packs/day: 2.00    Years: 23.00    Total pack years: 46.00    Types: Cigarettes    Start date: 49    Quit date: 1989    Years since quitting: 34.8   Smokeless tobacco: Never  Vaping Use   Vaping Use: Never used  Substance and Sexual Activity   Alcohol use: Yes    Alcohol/week: 2.0 standard drinks of alcohol    Types: 2 Standard drinks or equivalent per week   Drug use: Never   Sexual activity: Yes  Other Topics Concern   Not on file  Social History Narrative   Not on file   Social Determinants of Health   Financial Resource Strain: Not on file  Food Insecurity: Not on file  Transportation Needs: Not on file  Physical Activity: Not on file  Stress: Not on file  Social Connections: Not on file     Family History: The patient's family history includes Alzheimer's disease in his father; Heart attack in his father; Parkinson's disease in an other family member; Stroke in his father and another family member.  ROS:   Please see the history of present illness.     All other systems reviewed and are negative.  EKGs/Labs/Other Studies Reviewed:    The following studies were reviewed today:  Coronary CT 07/24/2019 Aorta: Normal size. Mild diffuse atherosclerotic plaque, no dissection.   Aortic Valve:  Trileaflet.  Minimal calcifications.   Coronary Arteries:  Normal coronary origin.  Right dominance.   RCA is a large dominant artery that gives rise to PDA and PLA. There is diffuse moderate, predominantly calcified plaque with stenosis 24-49% in the proximal and 50-59% in the mid and distal portion. PDA is a small caliber vessel that has a focal non-calcified plaque in the mid portion with stenosis > 70%. PDA lumen measures 2.1 mm.   Left main is a large artery that gives rise to LAD and LCX arteries. Left main has mild diffuse calcified plaque with stenosis 24-49%.   LAD is a large vessel that gives rise to one large diagonal  artery. Proximal LAD has a moderate diffuse calcified plaque with stenosis 50-69%. Mid LAD has mild diffuse calcified plaque with a focal stenosis 50-69%. Distal LAD has mild diffuse plaque.   D1 is a large artery (3.5 mm in diameter) that has severe non-calcified ostial plaque with stenosis suspicious for > 70%. Mid portion of D1 has mild diffuse calcified plaque with stenosis 25-49%.   LCX is a small non-dominant artery that gives rise to one small OM1 branch. There is minimal plaque.   Other findings:   Normal pulmonary vein drainage into the left atrium.  Normal left atrial appendage without a thrombus.   Normal size of the pulmonary artery.   IMPRESSION: 1. Coronary calcium score of 2768. This was 54 percentile for age and sex matched control.   2. Normal coronary origin with right dominance.   3. Suspicion for a severe stenosis in the mid portion of a small PDA and ostial portion of a large 1. diagonal artery. CAD-RADS 4 Severe stenosis. Additional analysis with CT FFR will be submitted.  1. Left Main: 1.0.   2. LAD: Proximal: 1.0, mid: 0.91, distal: 0.86. 3. D1: Proximal: 0.5 4. LCX: 0.96. 5. RCA: Proximal: 0.99, mid: 0.92, distal: 0.86. 6. PDA: Proximal: 0.88, distal: 0.67. 7. PLA: 0.87.   IMPRESSION: 1. CT FFR showed significant stenoses in the mid portion of a small PDA (2.1 mm) and in the ostial portion of a large 1. diagonal artery (lumen > 3.5 mm). Consider symptom-guided anti-ischemic pharmacotherapy as well as risk factor modification per guideline directed care. If this approach fails consider cardiac catheterization, however location of a stenosis in the ostial portion of D1 originating from the proximal LAD might preclude an intervention.  EKG:  EKG is ordered today.  The ekg ordered today demonstrates normal sinus rhythm, no significant ST-T wave changes.  Recent Labs: 04/08/2022: ALT 20; BUN 7; Creatinine, Ser 0.57; Hemoglobin 15.1; Platelets  212; Potassium 4.3; Sodium 136; TSH 2.310  Recent Lipid Panel    Component Value Date/Time   CHOL 106 04/08/2022 1259   TRIG 101 04/08/2022 1259   HDL 31 (L) 04/08/2022 1259   CHOLHDL 3.4 04/08/2022 1259   LDLCALC 56 04/08/2022 1259     Risk Assessment/Calculations:           Physical Exam:    VS:  BP 138/62 (BP Location: Left Arm, Patient Position: Sitting, Cuff Size: Normal)   Pulse 66   Ht '5\' 8"'$  (1.727 m)   Wt 207 lb 9.6 oz (94.2 kg)   SpO2 97%   BMI 31.57 kg/m         Wt Readings from Last 3 Encounters:  09/28/22 207 lb 9.6 oz (94.2 kg)  04/13/22 204 lb 12.8 oz (92.9 kg)  02/03/22 205 lb 3.2 oz (93.1 kg)     GEN:  Well nourished, well developed in no acute distress HEENT: Normal NECK: No JVD; No carotid bruits LYMPHATICS: No lymphadenopathy CARDIAC: RRR, no murmurs, rubs, gallops RESPIRATORY:  Clear to auscultation without rales, wheezing or rhonchi  ABDOMEN: Soft, non-tender, non-distended MUSCULOSKELETAL:  No edema; No deformity  SKIN: Warm and dry NEUROLOGIC:  Alert and oriented x 3 PSYCHIATRIC:  Normal affect   ASSESSMENT:    1. SOB (shortness of breath)   2. Coronary artery disease involving native coronary artery of native heart without angina pectoris   3. Essential hypertension   4. Hyperlipidemia LDL goal <70   5. Controlled type 2 diabetes mellitus without complication, without long-term current use of insulin (HCC)    PLAN:    In order of problems listed above:  Shortness of breath with exertion: Patient's girlfriend has been noticing sometimes his O2 saturation will drop down to below 90s after strenuous activity.  We walked roughly 76 year old to the office visit today, O2 saturation at the beginning of the walk was 96%, near the end of the walk, O2 saturation was 90%.  He denies any exertional Chest pain.  He does have underlying pulmonary issues which likely contribute to the drop in O2 saturation to exercise.  I discussed the  case with  DOD Dr. Phineas Inches, we will obtain an echocardiogram, however at this time we do not recommend ischemic work-up  CAD: Previous coronary CT obtained on 07/24/2019 showed significant disease in the distal vessel and also D1.  Medical management was recommended.  Hypertension: Blood pressure stable  Hyperlipidemia: On simvastatin  DM2: Managed by primary care provider.           Medication Adjustments/Labs and Tests Ordered: Current medicines are reviewed at length with the patient today.  Concerns regarding medicines are outlined above.  Orders Placed This Encounter  Procedures   EKG 12-Lead   ECHOCARDIOGRAM COMPLETE   No orders of the defined types were placed in this encounter.   Patient Instructions  Medication Instructions:  Your physician recommends that you continue on your current medications as directed. Please refer to the Current Medication list given to you today.  *If you need a refill on your cardiac medications before your next appointment, please call your pharmacy*  Lab Work: NONE ordered at this time of appointment   If you have labs (blood work) drawn today and your tests are completely normal, you will receive your results only by: [place] (if you have MyChart) OR A paper copy in the mail If you have any lab test that is abnormal or we need to change your treatment, we will call you to review the results.  Testing/Procedures: Your physician has requested that you have an echocardiogram. Echocardiography is a painless test that uses sound waves to create images of your heart. It provides your doctor with information about the size and shape of your heart and how well your heart's chambers and valves are working. This procedure takes approximately one hour. There are no restrictions for this procedure. Please do NOT wear cologne, perfume, aftershave, or lotions (deodorant is allowed). Please arrive 15 minutes prior to your appointment  time.   Follow-Up: At [facility], you and your health needs are our priority.  As part of our continuing mission to provide you with exceptional heart care, we have created designated Provider Care Teams.  These Care Teams include your primary Cardiologist (physician) and Advanced Practice Providers (APPs -  Physician Assistants and Nurse Practitioners) who all work together to provide you with the care you need, when you need it.   Your next appointment:   6 month(s)  The format for your next appointment:   In Person  Provider:   Sanda Klein, MD     Other Instructions  Important Information About Sugar         Hilbert Corrigan, [place]  09/29/2022 11:36 PM    [place]

## 2022-09-29 ENCOUNTER — Other Ambulatory Visit: Payer: Self-pay | Admitting: Family Medicine

## 2022-09-29 ENCOUNTER — Other Ambulatory Visit: Payer: Self-pay | Admitting: Cardiovascular Disease

## 2022-09-29 ENCOUNTER — Ambulatory Visit: Payer: Medicare HMO | Admitting: Nurse Practitioner

## 2022-09-29 ENCOUNTER — Encounter: Payer: Self-pay | Admitting: Physician Assistant

## 2022-10-05 ENCOUNTER — Other Ambulatory Visit (HOSPITAL_BASED_OUTPATIENT_CLINIC_OR_DEPARTMENT_OTHER): Payer: Self-pay | Admitting: Family Medicine

## 2022-10-05 DIAGNOSIS — K219 Gastro-esophageal reflux disease without esophagitis: Secondary | ICD-10-CM

## 2022-10-18 ENCOUNTER — Ambulatory Visit (HOSPITAL_BASED_OUTPATIENT_CLINIC_OR_DEPARTMENT_OTHER): Payer: Medicare HMO | Admitting: Family Medicine

## 2022-10-19 ENCOUNTER — Encounter (HOSPITAL_BASED_OUTPATIENT_CLINIC_OR_DEPARTMENT_OTHER): Payer: Self-pay | Admitting: Family Medicine

## 2022-10-19 ENCOUNTER — Ambulatory Visit (INDEPENDENT_AMBULATORY_CARE_PROVIDER_SITE_OTHER): Payer: Medicare HMO | Admitting: Family Medicine

## 2022-10-19 VITALS — BP 160/72 | HR 67 | Ht 68.0 in | Wt 206.6 lb

## 2022-10-19 DIAGNOSIS — E1151 Type 2 diabetes mellitus with diabetic peripheral angiopathy without gangrene: Secondary | ICD-10-CM

## 2022-10-19 DIAGNOSIS — I10 Essential (primary) hypertension: Secondary | ICD-10-CM | POA: Diagnosis not present

## 2022-10-19 DIAGNOSIS — Z1211 Encounter for screening for malignant neoplasm of colon: Secondary | ICD-10-CM | POA: Diagnosis not present

## 2022-10-19 LAB — HEMOGLOBIN A1C
Est. average glucose Bld gHb Est-mCnc: 154 mg/dL
Hgb A1c MFr Bld: 7 % — ABNORMAL HIGH (ref 4.8–5.6)

## 2022-10-19 NOTE — Progress Notes (Signed)
    Procedures performed today:    None.  Independent interpretation of notes and tests performed by another provider:   None.  Brief History, Exam, Impression, and Recommendations:    BP (!) 160/72 (BP Location: Right Arm, Patient Position: Sitting, Cuff Size: Large)   Pulse 67   Ht '5\' 8"'$  (1.727 m)   Wt 206 lb 9.6 oz (93.7 kg)   SpO2 100%   BMI 31.41 kg/m   Diabetes mellitus with peripheral vascular disease (HCC) Patient is overdue for recheck of hemoglobin A1c.  Patient continues with lifestyle modifications, prior hemoglobin A1c has generally been well-controlled.  He is up-to-date with nephropathy screening, was normal earlier this year.  No new reported symptoms of polyuria or polydipsia We will proceed with checking hemoglobin A1c today, can continue with lifestyle modifications.  Essential hypertension Blood pressure elevated in office today.  At recent cardiology visit, blood pressure was better controlled during that office visit.  Patient continues with amlodipine, atenolol, hydrochlorothiazide, lisinopril.  No reported issues at this time. For now, can continue with current medication regimen, no changes to be made today.  Recommend intermittent monitoring of blood pressure at home, DASH diet  Patient would like referral to proceed with colon cancer screening, referral to GI placed today to assist with arranging for screening colonoscopy  Spent 32 minutes on this patient encounter, including preparation, chart review, face-to-face counseling with patient and coordination of care, and documentation of encounter  Return in about 6 months (around 04/19/2023) for DM, HTN.   ___________________________________________ [name], MD, ABFM, CAQSM Primary Care and [place]

## 2022-10-19 NOTE — Patient Instructions (Signed)
  Medication Instructions:  Your physician recommends that you continue on your current medications as directed. Please refer to the Current Medication list given to you today. --If you need a refill on any your medications before your next appointment, please call your pharmacy first. If no refills are authorized on file call the office.-- Lab Work: Your physician has recommended that you have lab work today: Yes If you have labs (blood work) drawn today and your tests are completely normal, you will receive your results via [place] a phone call from our staff.  Please ensure you check your voicemail in the event that you authorized detailed messages to be left on a delegated number. If you have any lab test that is abnormal or we need to change your treatment, we will call you to review the results.  Referrals/Procedures/Imaging: No  Follow-Up: Your next appointment:   Your physician recommends that you schedule a follow-up appointment in: 4-6 months with Dr. de [place].  You will receive a text message or e-mail with a link to a survey about your care and experience with [place] today! We would greatly appreciate your feedback!   Thanks for letting us be apart of your health journey!!  Primary Care and Sports Medicine   Dr. Arlina Robes [place]   We encourage you to activate your patient portal called "MyChart".  Sign up information is provided on this After Visit Summary.  MyChart is used to connect with patients for Virtual Visits (Telemedicine).  Patients are able to view lab/test results, encounter notes, upcoming appointments, etc.  Non-urgent messages can be sent to your provider as well. To learn more about what you can do with MyChart, please visit --  [email or web address].

## 2022-10-21 ENCOUNTER — Other Ambulatory Visit (HOSPITAL_COMMUNITY): Payer: Medicare HMO

## 2022-11-04 ENCOUNTER — Other Ambulatory Visit (HOSPITAL_BASED_OUTPATIENT_CLINIC_OR_DEPARTMENT_OTHER): Payer: Self-pay

## 2022-11-04 DIAGNOSIS — J45909 Unspecified asthma, uncomplicated: Secondary | ICD-10-CM

## 2022-11-04 MED ORDER — ALBUTEROL SULFATE HFA 108 (90 BASE) MCG/ACT IN AERS: 1.0000 | INHALATION_SPRAY | Freq: Four times a day (QID) | RESPIRATORY_TRACT | 2 refills | Status: DC | PRN

## 2022-11-11 ENCOUNTER — Ambulatory Visit (HOSPITAL_COMMUNITY): Payer: Medicare HMO | Attending: Physician Assistant

## 2022-11-11 ENCOUNTER — Telehealth: Payer: Self-pay | Admitting: Gastroenterology

## 2022-11-11 DIAGNOSIS — R0602 Shortness of breath: Secondary | ICD-10-CM | POA: Diagnosis present

## 2022-11-11 LAB — ECHOCARDIOGRAM COMPLETE
Area-P 1/2: 3.12 cm2
S' Lateral: 2.8 cm

## 2022-11-11 NOTE — Telephone Encounter (Signed)
Thank you for the note.  I will review records when I can and forward a subsequent note with my recommendation.  - HD

## 2022-11-11 NOTE — Telephone Encounter (Signed)
Good Morning Dr Loletha Carrow,  Supervising Provider 12/19 PM  I have received records from this patient from [facility] with past colonoscopy reports. Patient is wanting to be seen here for his future colonoscopy states he has recently moved to [facility] and we are closer to him and he is also trying to get all his care transferred within [facility] for all of his doctors to have access to his records. Please advise on scheduling.  Thank you!

## 2022-11-18 ENCOUNTER — Other Ambulatory Visit (HOSPITAL_BASED_OUTPATIENT_CLINIC_OR_DEPARTMENT_OTHER): Payer: Self-pay | Admitting: Family Medicine

## 2022-11-18 ENCOUNTER — Encounter (HOSPITAL_BASED_OUTPATIENT_CLINIC_OR_DEPARTMENT_OTHER): Payer: Self-pay | Admitting: Pulmonary Disease

## 2022-11-18 ENCOUNTER — Ambulatory Visit (INDEPENDENT_AMBULATORY_CARE_PROVIDER_SITE_OTHER): Payer: Medicare HMO | Admitting: Pulmonary Disease

## 2022-11-18 VITALS — BP 138/72 | HR 66 | Temp 98.2°F | Ht 68.0 in | Wt 208.2 lb

## 2022-11-18 DIAGNOSIS — J849 Interstitial pulmonary disease, unspecified: Secondary | ICD-10-CM

## 2022-11-18 DIAGNOSIS — I10 Essential (primary) hypertension: Secondary | ICD-10-CM

## 2022-11-18 NOTE — Patient Instructions (Signed)
Lab tests today  Will arrange for appointment with Dr. Chase Caller to discuss research options for pulmonary fibrosis  Will arrange for 6 minute walk test  Follow up with Dr. Halford Chessman in 4 months

## 2022-11-18 NOTE — Progress Notes (Signed)
[facility]  Chief Complaint  Patient presents with   Follow-up    Pt states that his lungs are getting worse since LOV. Pt states that his Oxygen level has been going as low as 85%. Pt still having congestion at night.     Constitutional:  BP 138/72 (BP Location: Right Arm, Patient Position: Sitting, Cuff Size: Normal)   Pulse 66   Temp 98.2 F (36.8 C) (Oral)   Ht '5\' 8"'$  (1.727 m)   Wt 208 lb 3.2 oz (94.4 kg)   SpO2 96%   BMI 31.66 kg/m   Past Medical History:  DM type 2, ED, HLD, HTN  Past Surgical History:  His  has a past surgical history that includes Iliac artery aneurysm repair (Left, 2006) and Replacement total knee bilateral.  Brief Summary:  Vincent Meyers is a 76 y.o. male former smoker with interstitial lung disease and obstructive sleep apnea.  He worked as a Building control surveyor.      Subjective:   He saw Tammy and Dr. Loanne Drilling since his last visit.  Feels like breathing getting worse.  Has cough, but usually is dry.  Has tried albuterol, but doesn't help.  He checks his oxygen level at home, and sometimes drops below 90%.  SpO2 stayed above 90% while walking in office today on room air.  He has intermittent diarrhea.  He is supposed to get appointment with [facility] to assess for irritable bowel syndrome.  He had colon surgery several years ago.  No liver issues that he is aware of.  He stopped drinking alcohol about 6 months ago.  Physical Exam:   Appearance - well kempt   ENMT - no sinus tenderness, no oral exudate, no LAN, Mallampati 3 airway, no stridor  Respiratory - faint basilar crackles  CV - s1s2 regular rate and rhythm, no murmurs  Ext - no clubbing, no edema  Skin - no rashes  Psych - normal mood and affect     Pulmonary testing:  PFT 11/17/20 >> FEV1 2.73 (94%), FEV1% 83, TLC 4.36 (65%), DLCO 67% Serology 06/02/21 >> negative PFT 11/26/21 >> FEV1 2.89 (101%), FEV1% 87, TLC 4.78 (72%), DLCO 66%,  +BD  Chest Imaging:  Cardiac CT 07/25/19 >> mild fibrotic changes at bases Rt > Lt CT chest 08/24/20 >> UIP pattern HRCT chest 10/31/21 >> widespread but patchy areas of ground-glass attenuation, septal thickening, subpleural reticulation, parenchymal banding, traction bronchiectasis and frank honeycombing with progression since 2020 (UIP pattern)  Sleep Tests:  HST 10/25/20 >> AHI 23.4, SpO2 low 78% Auto CPAP 10/18/22 to 11/16/22 >> used on 30 of 30 nights with average 9 hrs 19 min.  Average AHI 3.4 with median CPAP 6 and 95 th percentile CPAP 8 cm H2O  Cardiac Tests:  Echo 11/11/22 >> EF 60 to 65%, grade 1 DD, aortic root 37 mm  Social History:  He  reports that he quit smoking about 35 years ago. His smoking use included cigarettes. He started smoking about 58 years ago. He has a 46.00 pack-year smoking history. He has never used smokeless tobacco. He reports current alcohol use of about 2.0 standard drinks of alcohol per week. He reports that he does not use drugs.  Family History:  His family history includes Alzheimer's disease in his father; Heart attack in his father; Parkinson's disease in an other family member; Stroke in his father and another family member.     Assessment/Plan:   ILD with UIP  pattern. - previous serology negative - has progressive disease on most recent CT chest - discussed options about antifibrotic therapies again - will arrange for lab work - will arrange for appointment with Dr. Chase Caller as a second opinion and to determine if he is a candidate any research protocols - will arrange for 6 minute walk test   Obstructive sleep apnea. - he is compliant with CPAP and reports benefit from therapy - uses [employer] for his DME - current CPAP ordered December 2021 - will change auto CPAP to 5 to 8 cm H2O  Coronary artery disease, hypertension. - followed by Dr. Dani Gobble Croitoru with [facility]  Time Spent Involved in Patient Care on Day of Examination:   38 minutes  Follow up:   Patient Instructions  Lab tests today  Will arrange for appointment with Dr. Chase Caller to discuss research options for pulmonary fibrosis  Will arrange for 6 minute walk test  Follow up with Dr. Halford Chessman in 4 months  Medication List:   Allergies as of 11/18/2022   No Known Allergies      Medication List        Accurate as of November 18, 2022 10:53 AM. If you have any questions, ask your nurse or doctor.          albuterol 108 (90 Base) MCG/ACT inhaler Commonly known as: VENTOLIN HFA Inhale 1-2 puffs into the lungs every 6 (six) hours as needed.   amLODipine 5 MG tablet Commonly known as: NORVASC Take 1 tablet (5 mg total) by mouth daily.   aspirin EC 81 MG tablet Take by mouth.   atenolol 50 MG tablet Commonly known as: TENORMIN TAKE 1 AND 1/2 TABLETS BY MOUTH DAILY   FLAXSEED OIL PO Take by mouth.   hydrochlorothiazide 25 MG tablet Commonly known as: HYDRODIURIL TAKE 1 TABLET BY MOUTH DAILY   lisinopril 40 MG tablet Commonly known as: ZESTRIL Take 1 tablet (40 mg total) by mouth daily.   Magnesium Gluconate 550 MG Tabs Take by mouth.   Omega-3 1000 MG Caps Take by mouth.   omeprazole 20 MG capsule Commonly known as: PRILOSEC TAKE 1 CAPSULE BY MOUTH EVERY DAY   PROBIOTIC-10 PO Take by mouth.   QC TUMERIC COMPLEX PO Take by mouth.   sildenafil 20 MG tablet Commonly known as: REVATIO Take 1 tablet (20 mg total) by mouth daily as needed.   simvastatin 40 MG tablet Commonly known as: ZOCOR TAKE 1 TABLET(40 MG) BY MOUTH EVERY NIGHT        Signature:  Chesley Mires, MD [place] Pager - [phone] 11/18/2022, 10:53 AM

## 2022-11-19 LAB — COMPREHENSIVE METABOLIC PANEL
ALT: 18 IU/L (ref 0–44)
AST: 23 IU/L (ref 0–40)
Albumin/Globulin Ratio: 1.6 (ref 1.2–2.2)
Albumin: 4.2 g/dL (ref 3.8–4.8)
Alkaline Phosphatase: 72 IU/L (ref 44–121)
BUN/Creatinine Ratio: 19 (ref 10–24)
BUN: 11 mg/dL (ref 8–27)
Bilirubin Total: 0.7 mg/dL (ref 0.0–1.2)
CO2: 25 mmol/L (ref 20–29)
Calcium: 8.9 mg/dL (ref 8.6–10.2)
Chloride: 95 mmol/L — ABNORMAL LOW (ref 96–106)
Creatinine, Ser: 0.59 mg/dL — ABNORMAL LOW (ref 0.76–1.27)
Globulin, Total: 2.7 g/dL (ref 1.5–4.5)
Glucose: 180 mg/dL — ABNORMAL HIGH (ref 70–99)
Potassium: 4.1 mmol/L (ref 3.5–5.2)
Sodium: 132 mmol/L — ABNORMAL LOW (ref 134–144)
Total Protein: 6.9 g/dL (ref 6.0–8.5)
eGFR: 101 mL/min/{1.73_m2} (ref >59)

## 2022-11-19 LAB — CBC WITH DIFFERENTIAL/PLATELET
Basophils Absolute: 0.1 10*3/uL (ref 0.0–0.2)
Basos: 1 %
EOS (ABSOLUTE): 0.4 10*3/uL (ref 0.0–0.4)
Eos: 4 %
Hematocrit: 42.9 % (ref 37.5–51.0)
Hemoglobin: 14.6 g/dL (ref 13.0–17.7)
Immature Grans (Abs): 0.1 10*3/uL (ref 0.0–0.1)
Immature Granulocytes: 1 %
Lymphocytes Absolute: 1.6 10*3/uL (ref 0.7–3.1)
Lymphs: 15 %
MCH: 27.1 pg (ref 26.6–33.0)
MCHC: 34 g/dL (ref 31.5–35.7)
MCV: 80 fL (ref 79–97)
Monocytes Absolute: 0.9 10*3/uL (ref 0.1–0.9)
Monocytes: 8 %
Neutrophils Absolute: 8 10*3/uL — ABNORMAL HIGH (ref 1.4–7.0)
Neutrophils: 71 %
Platelets: 228 10*3/uL (ref 150–450)
RBC: 5.38 x10E6/uL (ref 4.14–5.80)
RDW: 12.3 % (ref 11.6–15.4)
WBC: 11.1 10*3/uL — ABNORMAL HIGH (ref 3.4–10.8)

## 2022-11-19 LAB — HEPATITIS B CORE AB W/REFLEX: Hep B Core Total Ab: NEGATIVE

## 2022-11-19 LAB — HCV INTERPRETATION

## 2022-11-19 LAB — HCV AB W REFLEX TO QUANT PCR: HCV Ab: NONREACTIVE

## 2022-11-22 HISTORY — PX: COLONOSCOPY: SHX174

## 2022-11-24 NOTE — Telephone Encounter (Signed)
(  For documentation purposes: 2 subcentimeter tubular adenomas removed with biopsy forceps 11/19//2013 by Dr. Lyda Jester Left colon anastomosis from prior diverticular resection No polyps December 2018)  ______________________________   This patient has a chronic pulmonary condition for which she last saw his specialist on 11/10/2022.  He reportedly has diarrhea and needs further evaluation as well.  Please arrange a clinic visit for this patient to see me to evaluate his overall health and digestive symptoms so I can determine the need for timing of colonoscopy or any other testing.  I will have my MA hold these records for further review at the office visit.  - H. Danis

## 2022-11-26 ENCOUNTER — Encounter: Payer: Self-pay | Admitting: Gastroenterology

## 2022-12-02 ENCOUNTER — Other Ambulatory Visit (HOSPITAL_BASED_OUTPATIENT_CLINIC_OR_DEPARTMENT_OTHER): Payer: Self-pay | Admitting: Family Medicine

## 2022-12-13 ENCOUNTER — Telehealth (HOSPITAL_BASED_OUTPATIENT_CLINIC_OR_DEPARTMENT_OTHER): Payer: Self-pay | Admitting: Family Medicine

## 2022-12-13 NOTE — Telephone Encounter (Signed)
Left message for patient to call back and schedule Medicare Annual Wellness Visit (AWV) in office.   If not able to come in office, please offer to do virtually or by telephone.  Left office number and my jabber [phone].  Last AWV:05/23/2019  Please schedule at anytime with Nurse Health Advisor.

## 2022-12-13 NOTE — Assessment & Plan Note (Addendum)
Blood pressure elevated in office today.  At recent cardiology visit, blood pressure was better controlled during that office visit.  Patient continues with amlodipine, atenolol, hydrochlorothiazide, lisinopril.  No reported issues at this time. For now, can continue with current medication regimen, no changes to be made today.  Recommend intermittent monitoring of blood pressure at home, DASH diet

## 2022-12-13 NOTE — Assessment & Plan Note (Signed)
Patient is overdue for recheck of hemoglobin A1c.  Patient continues with lifestyle modifications, prior hemoglobin A1c has generally been well-controlled.  He is up-to-date with nephropathy screening, was normal earlier this year.  No new reported symptoms of polyuria or polydipsia We will proceed with checking hemoglobin A1c today, can continue with lifestyle modifications.

## 2022-12-20 ENCOUNTER — Encounter: Payer: Self-pay | Admitting: Internal Medicine

## 2022-12-20 ENCOUNTER — Telehealth: Payer: Self-pay | Admitting: Internal Medicine

## 2022-12-20 ENCOUNTER — Ambulatory Visit: Payer: Medicare HMO | Admitting: Internal Medicine

## 2022-12-20 VITALS — BP 138/72 | HR 67 | Temp 98.7°F | Ht 68.0 in | Wt 210.6 lb

## 2022-12-20 DIAGNOSIS — J84112 Idiopathic pulmonary fibrosis: Secondary | ICD-10-CM | POA: Diagnosis not present

## 2022-12-20 DIAGNOSIS — Z87891 Personal history of nicotine dependence: Secondary | ICD-10-CM

## 2022-12-20 DIAGNOSIS — J849 Interstitial pulmonary disease, unspecified: Secondary | ICD-10-CM

## 2022-12-20 NOTE — Patient Instructions (Addendum)
ICD-10-CM   1. ILD (interstitial lung disease) (HCC)  J84.9     2. UIP (usual interstitial pneumonitis) ([place])  J84.112     3. 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-pay   - Slowly increase the dose per protocol  -Always take it with food  -Any nausea you can try ginger capsules  -Give at least between 5 and 6 hours between dosing  -Good idea to participate in [place] sponsored medication support program - this is optional  -Definitely apply sunscreen when you go out with this medication  - You will need monthly liver tests for 6 months and thereafter every 3 months  Other pillars in management such as weight loss, exercise, and trials to discuss in future  Followup  - 6 weeks with APP or Dr Chase Caller to see esbriet uptake   Symptoms socre and walk test at followup

## 2022-12-20 NOTE — Progress Notes (Signed)
OV 12/20/2022 -evaluation of ILD care.  Referred by Dr. Halford Chessman from [facility] pulmonary location.  Subjective:  Patient ID: Vincent Meyers, male , DOB: [birth year] , age 77 y.o. , MRN: [id number] , ADDRESS: [place] [id number] PCP de [place], Raymond J, MD Patient Care Team: de [place], Blondell Reveal, MD as PCP - General (Family Medicine) Sanda Klein, MD as PCP - Cardiology (Cardiology)  This Provider for this visit: Treatment Team:  Attending Provider: Brand Males, MD    12/20/2022 -   Chief Complaint  Patient presents with   New Patient (Initial Visit)    New pt from Dr Halford Chessman for ILD. Last CT scan was 2022. Echocardiogram was 11/11/2022. Pt is only on Albuterol as needed.      HPI Vincent Meyers 77 y.o. -history is gained talking to the patient and review of the external record.  He tells me that his primary care physician show told him some 5 years ago that he had scarring in the lung.  Then 2 years ago he was in [place] and got hospitalized for unrelated reasons and was told he had pulmonary fibrosis.  After that he did see Dr. Halford Chessman.  So he feels that he has had 5 years worth of insidious onset of shortness of breath that is slowly progressive.  More progressive in the last year or 2.  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      [facility] Comprehensive ILD Questionnaire  Symptoms:   SYMPTOM SCALE - ILD 12/20/2022  Current weight   O2 use 0-uses CPAP at night but without oxygen.  Shortness of Breath 0 -> 5 scale with 5 being worst (score 6 If unable to do)  At rest 1  Simple tasks - showers, clothes change, eating, shaving 2  Household (dishes, doing bed, laundry) 3  Shopping 2  Walking level at own pace 2  Walking up Stairs 4  Total (30-36) Dyspnea Score 14      Non-dyspnea symptoms (0-> 5 scale) 12/20/2022  How bad is your cough? 1  How bad is your fatigue 2  How bad  is nausea 0  How bad is vomiting?  0  How bad is diarrhea? 3  How bad is anxiety? 0  How bad is depression 1  Any chronic pain - if so where and how bad 2     Past Medical History :  - He does have sleep apnea and uses CPAP -sees Dr. Halford Chessman -He has irritable bowel syndrome and diarrhea.  GI referral in [place] is pending. -Does have acid reflux disease - Has cardiac history not otherwise specified. -Has had COVID in May 2022. -Denies any asthma or COPD.  Has an albuterol inhaler that does not help him.  ROS:  -Has fatigue and chronic pain particularly in the knees and shoulders. - Sometimes liquid just try to go down the windpipe and he chokes.  He does have diarrhea with irritable bowel syndrome - He had dry mouth after COVID but he does not have that now. - Does have acid reflux  FAMILY HISTORY of LUNG DISEASE:  -Only he has pulmonary fibrosis but otherwise no lung disease in the family  PERSONAL EXPOSURE HISTORY:  -Smoke.  Deport.  Greater than 40 pack smoking history.  He has done some mild marijuana in the past.  Never used cocaine.  Never used intravenous drugs.  HOME  EXPOSURE and HOBBY DETAILS :  [place]  He is lived there for 5 years but the home is 1 years.  Previously lived in a 76 year old home.  He believes that might have been some mold or mildew in the shower curtain but is never seen there.  He does use a CPAP but there is no mold or mildew in it.  Detail organic and inorganic antigen exposure history in the house is negative.  OCCUPATIONAL HISTORY (122 questions) : -He is retired but he is worked in [employer].  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  He goes to the shop every few days a week.  He does have a motor home and travel.  He has not been to [place].  No agent orange exposure but he does have some oil heater exposure.  There are some woodwork exposure.   Machinist exposure present.  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  He has done some welding on course.  PULMONARY TOXICITY HISTORY (27 items):  Denies  INVESTIGATIONS: -Last PFT was 1 year ago and compared to 2021 was relatively stable -Last CT scan of the chest December 2022 but this shows progression over the 2 years since 2020     Latest Reference Range & Units 06/02/21 12:11  Anti Nuclear Antibody (ANA) Negative  Negative  RA Latex Turbid. <14 IU/mL <14  Scleroderma (Scl-70) (ENA) Antibody, IgG <1.0 NEG AI <1.0 NEG   HRCT  - last Dec 2022   Narrative & Impression  CLINICAL DATA:  77 year old male with history of pulmonary fibrosis. Follow-up study.   EXAM: CT CHEST WITHOUT CONTRAST   TECHNIQUE: Multidetector CT imaging of the chest was performed following the standard protocol without intravenous contrast. High resolution imaging of the lungs, as well as inspiratory and expiratory imaging, was performed.   COMPARISON:  Cardiac CT 07/24/2019.   FINDINGS: Cardiovascular: Heart size is normal. There is no significant pericardial fluid, thickening or pericardial calcification. There is aortic atherosclerosis, as well as atherosclerosis of the great vessels of the mediastinum and the coronary arteries, including calcified atherosclerotic plaque in the left main, left anterior descending, left circumflex and right coronary arteries.   Mediastinum/Nodes: No pathologically enlarged mediastinal or hilar lymph nodes. Multiple prominent but nonenlarged mediastinal and hilar lymph nodes are incidentally noted. Esophagus is unremarkable in appearance. No axillary lymphadenopathy.   Lungs/Pleura: High-resolution images demonstrate widespread but patchy areas of ground-glass attenuation, septal thickening, subpleural reticulation, parenchymal banding, traction bronchiectasis and  frank honeycombing. Findings have a definitive craniocaudal gradient and are clearly progressive compared to the prior examination from 2020. Inspiratory and expiratory imaging is unremarkable. No acute consolidative airspace disease. No pleural effusions. No suspicious appearing pulmonary nodules or masses are confidently identified upon this background of fibrotic lung disease.   Upper Abdomen: Aortic atherosclerosis.   Musculoskeletal: There are no aggressive appearing lytic or blastic lesions noted in the visualized portions of the skeleton.   IMPRESSION: 1. The appearance of the lungs is considered diagnostic of usual interstitial pneumonia (UIP) per current ATS guidelines, with clear progression compared to the prior examination, as discussed above. 2. Aortic atherosclerosis, in addition to left main and 3 vessel coronary artery disease. Please note that although the presence of coronary artery calcium documents the  presence of coronary artery disease, the severity of this disease and any potential stenosis cannot be assessed on this non-gated CT examination. Assessment for potential risk factor modification, dietary therapy or pharmacologic therapy may be warranted, if clinically indicated.   Aortic Atherosclerosis (ICD10-I70.0).     Electronically Signed   By: Vinnie Langton M.D.   On: 10/31/2021 08:23   PFT     Latest Ref Rng & Units 11/26/2021    2:58 PM 11/17/2020    2:54 PM  PFT Results  FVC-Pre L 3.27  P 3.28   FVC-Predicted Pre % 83  P 82   FVC-Post L 3.31  P 3.16   FVC-Predicted Post % 83  P 79   Pre FEV1/FVC % % 68  P 83   Post FEV1/FCV % % 87  P 85   FEV1-Pre L 2.22  P 2.73   FEV1-Predicted Pre % 77  P 94   FEV1-Post L 2.89  P 2.69   DLCO uncorrected ml/min/mmHg 15.89  P 16.02   DLCO UNC% % 66  P 67   DLCO corrected ml/min/mmHg 15.89  P 16.02   DLCO COR %Predicted % 66  P 67   DLVA Predicted % 94  P 88   TLC L 4.78  P 4.36   TLC % Predicted % 72   P 65   RV % Predicted % 62  P 47     P Preliminary result    ECHO dec 2023    IMPRESSIONS     1. Left ventricular ejection fraction, by estimation, is 60 to 65%. The  left ventricle has normal function. The left ventricle has no regional  wall motion abnormalities. Left ventricular diastolic parameters are  consistent with Grade I diastolic  dysfunction (impaired relaxation). The average left ventricular global  longitudinal strain is -23.8 %. The global longitudinal strain is normal.   2. Right ventricular systolic function is normal. The right ventricular  size is normal.   3. The mitral valve is normal in structure. No evidence of mitral valve  regurgitation. No evidence of mitral stenosis.   4. The aortic valve is tricuspid. Aortic valve regurgitation is not  visualized. No aortic stenosis is present.   5. Aortic dilatation noted. There is mild dilatation of the aortic root,  measuring 37 mm. There is mild dilatation of the ascending aorta,  measuring 37 mm.   6. The inferior vena cava is normal in size with <50% respiratory  variability, suggesting right atrial pressure of 8 mmHg.    has a past medical history of Diabetes mellitus without complication ([facility]), Erectile dysfunction, Hyperlipidemia, Hypertension, ILD (interstitial lung disease) ([facility]), and OSA (obstructive sleep apnea).   reports that he quit smoking about 35 years ago. His smoking use included cigarettes. He started smoking about 58 years ago. He has a 46.00 pack-year smoking history. He has never used smokeless tobacco.  Past Surgical History:  Procedure Laterality Date   ILIAC ARTERY ANEURYSM REPAIR Left 2006   Dr. Donnetta Hutching (redo surgery)   REPLACEMENT TOTAL KNEE BILATERAL      No Known Allergies  Immunization History  Administered Date(s) Administered   Fluad Quad(high Dose 65+) 08/23/2022   Influenza, High Dose Seasonal PF 10/24/2019   Influenza,inj,Quad PF,6-35 Mos 10/11/2016, 10/24/2018    Influenza,inj,quad, With Preservative 09/19/2015   Influenza,trivalent, recombinat, inj, PF 08/22/2017   PFIZER(Purple Top)SARS-COV-2 Vaccination 01/06/2020, 01/29/2020   Pneumococcal Conjugate-13 05/19/2016   Pneumococcal Polysaccharide-23 09/05/2012   Tdap 03/12/2015  Unspecified SARS-COV-2 Vaccination 08/16/2022   Zoster, Live 11/05/2013    Family History  Problem Relation Age of Onset   Heart attack Father    Stroke Father    Alzheimer's disease Father    Stroke Other    Parkinson's disease Other      Current Outpatient Medications:    albuterol (VENTOLIN HFA) 108 (90 Base) MCG/ACT inhaler, Inhale 1-2 puffs into the lungs every 6 (six) hours as needed., Disp: 8 g, Rfl: 2   amLODipine (NORVASC) 5 MG tablet, Take 1 tablet (5 mg total) by mouth daily., Disp: 90 tablet, Rfl: 1   aspirin EC 81 MG tablet, Take by mouth., Disp: , Rfl:    atenolol (TENORMIN) 50 MG tablet, TAKE 1 AND 1/2 TABLETS BY MOUTH DAILY, Disp: 135 tablet, Rfl: 1   Flaxseed, Linseed, (FLAXSEED OIL PO), Take by mouth., Disp: , Rfl:    hydrochlorothiazide (HYDRODIURIL) 25 MG tablet, TAKE 1 TABLET BY MOUTH DAILY, Disp: 90 tablet, Rfl: 0   lisinopril (ZESTRIL) 40 MG tablet, TAKE 1 TABLET BY MOUTH EVERY DAY, Disp: 90 tablet, Rfl: 1   Magnesium Gluconate 550 MG TABS, Take by mouth., Disp: , Rfl:    Omega-3 1000 MG CAPS, Take by mouth., Disp: , Rfl:    omeprazole (PRILOSEC) 20 MG capsule, TAKE 1 CAPSULE BY MOUTH EVERY DAY, Disp: 90 capsule, Rfl: 1   Probiotic Product (PROBIOTIC-10 PO), Take by mouth., Disp: , Rfl:    sildenafil (REVATIO) 20 MG tablet, Take 1 tablet (20 mg total) by mouth daily as needed., Disp: 30 tablet, Rfl: 0   simvastatin (ZOCOR) 40 MG tablet, TAKE 1 TABLET(40 MG) BY MOUTH EVERY NIGHT, Disp: 90 tablet, Rfl: 1   Turmeric (QC TUMERIC COMPLEX PO), Take by mouth., Disp: , Rfl:       Objective:   Vitals:   12/20/22 0936  BP: 138/72  Pulse: 67  Temp: 98.7 F (37.1 C)  TempSrc: Oral  SpO2: 96%   Weight: 210 lb 9.6 oz (95.5 kg)  Height: '5\' 8"'$  (1.727 m)    Estimated body mass index is 32.02 kg/m as calculated from the following:   Height as of this encounter: '5\' 8"'$  (1.727 m).   Weight as of this encounter: 210 lb 9.6 oz (95.5 kg).  '@WEIGHTCHANGE'$ @  [employer]   12/20/22 0936  Weight: 210 lb 9.6 oz (95.5 kg)     Physical Exam    General: No distress. Looks well Neuro: Alert and Oriented x 3. GCS 15. Speech normal Psych: Pleasant Resp:  Barrel Chest - no.  Wheeze - no, Crackles - YES, No overt respiratory distress CVS: Normal heart sounds. Murmurs - no Ext: Stigmata of Connective Tissue Disease - NO VISCERAL OBESOTY + HEENT: Normal upper airway. PEERL +. No post nasal drip        Assessment:       ICD-10-CM   1. IPF (idiopathic pulmonary fibrosis) ([facility])  J84.112     2. 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 Stopped smoking with greater than 40 pack year history  Z17.891     Based on age greater than 23, Caucasian, male gender, type of occupation, previous smoking, UIP and progression this is IPF especially with negative serology.  We discussed several factors about the disease  Re IPF - Course   - progressive disease in almost all patients (> 90%); typically few to several year  progression   - super unlukcy 10% - progress to death in a year   - super lucky < 10% - stability > 5 years or even rarely 10 years   -unpredictable in each individual - Rx:  anti-fibrotics + since 2014 but they can only slow disease down; preventative. Not Rx symptoms  - they have side effects including intolerance is < 1/3rd patients  - most 55-65% patients tolerate them well  - further details below - Other pillars of management -to be addressed in the future  - Symptoms - cough and dyspnea RX  - O2 - definitely helps symptoms  - Rehab - definitely helps symptoms and conditioning  - Transplant - age <  49, good social support, BMI < 26 and $15,000 in cash  - Pulmonary Trials: highly encourage participation through [facility]  - Patient Support Group    - [email or web address] \  Anti-fibrotic Drugs Both drugs OFEV and Esbriet only slow down progression, 1 out of 6 patients  - this means extension in quality of life but no difference in symptoms  - no study directly compares the 2 drugs but efficacy roughly equal at 1 year time point 0-however going to hold off on nintedanib/Ofev because of his irritable bowel syndrome.  Committing him to pirfenidone/ - ESBRIET   - 3 pill three times daily, slow titration.  - Need to wean sunscreen  - Some chance of nausea and anorexia with small chance for diarrhea  - no known heart attack risk - no known bleeding risk,   - need monthly blood work for 6 months - monitor liver function - possible mortality benefit in pooled analysis  - larger world wide experience     Plan:     Patient Instructions     ICD-10-CM   1. ILD (interstitial lung disease) ([facility])  J84.9     2. 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-pay   - Slowly increase the dose per protocol  -Always take it with food  -Any nausea you can try ginger capsules  -Give at least between 5 and 6 hours between dosing  -Good idea to participate in [facility] sponsored medication support program - this is optional  -Definitely apply sunscreen when you go out with this medication  - You will need monthly liver tests for 6 months and thereafter every 3 months  Other pillars in management such as weight loss, exercise, and trials to discuss in future  Followup  - 6 weeks with APP or Dr  Chase Caller to see esbriet uptake   ( Level 05 visit: Estb 40-54 min n  visit type: on-site physical face to visit  in total care time and counseling or/and coordination of care by this undersigned MD - Dr Brand Males. This includes one or more of the following on this same day 12/20/2022: pre-charting, chart review, note writing, documentation discussion of test results, diagnostic or treatment recommendations, prognosis, risks and benefits of management options, instructions, education, compliance or risk-factor reduction. It excludes time spent by the [facility] or office staff in the care of the patient. Actual time 64 min)   SIGNATURE    Dr.  Brand Males, M.D., F.C.C.P,  Pulmonary and Critical Care Medicine Staff Physician, [facility] Director - Interstitial Lung Disease  Program  Pulmonary [facility] [place], [place], [address]  Pager: [phone], If no answer or between  15:00h - 7:00h: call [phone] Telephone: [phone]  10:11 AM 12/20/2022

## 2022-12-20 NOTE — Telephone Encounter (Signed)
He was asking about his albuterol saying it did not help him.  I did indicate to him that we could give him a sample of Spiriva although he does not have emphysema.  It would be totally empiric.  If you have 1 we can give him at this visit on next visit.  Please let him know

## 2022-12-22 ENCOUNTER — Telehealth: Payer: Self-pay | Admitting: Pharmacist

## 2022-12-22 DIAGNOSIS — J84112 Idiopathic pulmonary fibrosis: Secondary | ICD-10-CM

## 2022-12-22 NOTE — Telephone Encounter (Signed)
Received new start paperwork for Esbriet/pirfenidone. Placed application in PAP pending info folder in pharmacy office pending PA determination. Please start BIV for brand name ESBRIET first (per [facility] Amy pt assistance requirements)  Knox Saliva, PharmD, MPH, BCPS, CPP Clinical Pharmacist (Rheumatology and Pulmonology)

## 2022-12-23 ENCOUNTER — Encounter: Payer: Self-pay | Admitting: Gastroenterology

## 2022-12-23 ENCOUNTER — Ambulatory Visit: Payer: Medicare HMO | Admitting: Gastroenterology

## 2022-12-23 VITALS — BP 138/70 | HR 72 | Ht 67.5 in | Wt 210.0 lb

## 2022-12-23 DIAGNOSIS — Z86010 Personal history of colonic polyps: Secondary | ICD-10-CM

## 2022-12-23 DIAGNOSIS — K529 Noninfective gastroenteritis and colitis, unspecified: Secondary | ICD-10-CM | POA: Diagnosis not present

## 2022-12-23 MED ORDER — NA SULFATE-K SULFATE-MG SULF 17.5-3.13-1.6 GM/177ML PO SOLN: 1.0000 | Freq: Once | ORAL | 0 refills | Status: AC

## 2022-12-23 NOTE — Progress Notes (Signed)
[facility] Consult Note:  History: Vincent Meyers 12/23/2022  Referring provider: [place], Blondell Reveal, MD  Reason for consult/chief complaint: hx of colon polyps (Last colon about 5 years ago in [place], [place])   Subjective  HPI: Vincent Meyers was referred to [place] for a history of colon polyps, after which chart review indicated he was having diarrhea and under the care of pulmonary for interstitial lung disease.  Therefore, he was brought for office evaluation today.  Vincent Meyers had previous care with Dr. Lyda Jester in [place].  Summary of records from Dr. Lyda Jester in [place]: 2 subcentimeter tubular adenomas removed with biopsy forceps 11/19//2013 by Dr. Lyda Jester Left colon anastomosis from prior diverticular resection No polyps December 2018 EGD 10/10/2012, indication reflux with dysphagia.  Variable Z-line, no Barrett's on biopsy.  Hiatal hernia.  Gastric erosion, biopsy negative for H. pylori.  (Histology and CLO test)  Vincent Meyers says for about the last 10 years he has had chronic problems with gas bloating and diarrhea.  Gas and bloating seem to bother him more regularly, the diarrhea seems to come and episodes that may last days to weeks.  Dr. Lyda Jester suggested he had IBS and tried him on dicyclomine that Vincent Meyers did not find helpful.  He has little no abdominal cramps.  At 1 point Pepto-Bismol seem to help relieve the symptoms when he would take it at the first sign of gas or bloating, but it is no longer very helpful. He denies rectal bleeding, his appetite is good and no apparent weight loss. For the last few weeks he has felt well without diarrhea.  Dietary changes last few years since his girlfriend eats primarily vegetables and fish and he has done the same.  Exam has not noticed any particular or consistent dietary triggers.  ROS:  Review of Systems  Constitutional:  Negative for appetite change and unexpected weight change.  HENT:  Negative for mouth sores and voice  change.   Eyes:  Negative for pain and redness.  Respiratory:  Positive for shortness of breath. Negative for cough.   Cardiovascular:  Negative for chest pain and palpitations.  Genitourinary:  Negative for dysuria and hematuria.  Musculoskeletal:  Negative for arthralgias and myalgias.  Skin:  Negative for pallor and rash.  Neurological:  Negative for weakness and headaches.  Hematological:  Negative for adenopathy.   He has an oxygen meter which he regularly uses and says his numbers are good even with activity.  Past Medical History: Past Medical History:  Diagnosis Date   Anxiety    Arthritis    Diabetes mellitus without complication (HCC)    Erectile dysfunction    Hyperlipidemia    Hypertension    IBS (irritable bowel syndrome)    ILD (interstitial lung disease) (HCC)    OSA (obstructive sleep apnea)    From 12/20/2022 pulmonologist note impression and plan:  "Anti-fibrotic Drugs Both drugs OFEV and Esbriet only slow down progression, 1 out of 6 patients  - this means extension in quality of life but no difference in symptoms  - no study directly compares the 2 drugs but efficacy roughly equal at 1 year time point 0-however going to hold off on nintedanib/Ofev because of his irritable bowel syndrome.  Committing him to pirfenidone/ - ESBRIET"  Past Surgical History: Past Surgical History:  Procedure Laterality Date   APPENDECTOMY     COLON SURGERY     perforated bowel and hernia repair   ILIAC ARTERY ANEURYSM REPAIR Left 2006  Dr. Donnetta Hutching (redo surgery)   REPLACEMENT TOTAL KNEE BILATERAL     TONSILLECTOMY     age 54     Family History: Family History  Problem Relation Age of Onset   Parkinson's disease Mother    Heart attack Father    Stroke Father    Alzheimer's disease Father    Alzheimer's disease Sister    Other Sister        degenerative muscle disease   Hypertension Daughter    Obesity Daughter    Heart murmur Daughter     Social  History: Social History   Socioeconomic History   Marital status: Widowed    Spouse name: Not on file   Number of children: 2   Years of education: Not on file   Highest education level: Not on file  Occupational History   Occupation: retired  Tobacco Use   Smoking status: Former    Packs/day: 2.00    Years: 23.00    Total pack years: 46.00    Types: Cigarettes    Start date: 1966    Quit date: 1989    Years since quitting: 35.1   Smokeless tobacco: Never  Vaping Use   Vaping Use: Never used  Substance and Sexual Activity   Alcohol use: Not Currently    Alcohol/week: 2.0 standard drinks of alcohol    Types: 2 Standard drinks or equivalent per week    Comment: quit 7 months ago stated 12/23/22   Drug use: Never   Sexual activity: Yes  Other Topics Concern   Not on file  Social History Narrative   Not on file   Social Determinants of Health   Financial Resource Strain: Not on file  Food Insecurity: Not on file  Transportation Needs: Not on file  Physical Activity: Not on file  Stress: Not on file  Social Connections: Not on file    Allergies: No Known Allergies  Outpatient Meds: Current Outpatient Medications  Medication Sig Dispense Refill   albuterol (VENTOLIN HFA) 108 (90 Base) MCG/ACT inhaler Inhale 1-2 puffs into the lungs every 6 (six) hours as needed. 8 g 2   amLODipine (NORVASC) 5 MG tablet Take 1 tablet (5 mg total) by mouth daily. 90 tablet 1   aspirin EC 81 MG tablet Take by mouth.     atenolol (TENORMIN) 50 MG tablet TAKE 1 AND 1/2 TABLETS BY MOUTH DAILY 135 tablet 1   Flaxseed, Linseed, (FLAXSEED OIL PO) Take by mouth.     hydrochlorothiazide (HYDRODIURIL) 25 MG tablet TAKE 1 TABLET BY MOUTH DAILY 90 tablet 0   lisinopril (ZESTRIL) 40 MG tablet TAKE 1 TABLET BY MOUTH EVERY DAY 90 tablet 1   Magnesium Gluconate 550 MG TABS Take by mouth.     Na Sulfate-K Sulfate-Mg Sulf 17.5-3.13-1.6 GM/177ML SOLN Take 1 kit by mouth once for 1 dose. 354 mL 0    Omega-3 1000 MG CAPS Take by mouth.     omeprazole (PRILOSEC) 20 MG capsule TAKE 1 CAPSULE BY MOUTH EVERY DAY 90 capsule 1   Probiotic Product (PROBIOTIC-10 PO) Take by mouth.     sildenafil (REVATIO) 20 MG tablet Take 1 tablet (20 mg total) by mouth daily as needed. 30 tablet 0   simvastatin (ZOCOR) 40 MG tablet TAKE 1 TABLET(40 MG) BY MOUTH EVERY NIGHT 90 tablet 1   Turmeric (QC TUMERIC COMPLEX PO) Take by mouth.     No current facility-administered medications for this visit.      ___________________________________________________________________ Objective  Exam:  BP 138/70 (BP Location: Left Arm)   Pulse 72   Ht 5' 7.5" (1.715 m) Comment: height measured without shoes  Wt 210 lb (95.3 kg)   BMI 32.41 kg/m  Wt Readings from Last 3 Encounters:  12/23/22 210 lb (95.3 kg)  12/20/22 210 lb 9.6 oz (95.5 kg)  11/18/22 208 lb 3.2 oz (94.4 kg)    General: Well-appearing, breathing comfortably on room air, somewhat hard of hearing.  Ambulatory, gets on exam table without difficulty. Eyes: sclera anicteric, no redness ENT: oral mucosa moist without lesions, no cervical or supraclavicular lymphadenopathy CV: Regular without appreciable murmur, no JVD, no peripheral edema Resp: Mid to lower lung field crackles bilaterally, normal RR and effort noted GI: soft, no tenderness, with active bowel sounds. No guarding or palpable organomegaly noted. Skin; warm and dry, no rash or jaundice noted Neuro: awake, alert and oriented x 3. Normal gross motor function and fluent speech  Data:  ECHO dec 2023    IMPRESSIONS     1. Left ventricular ejection fraction, by estimation, is 60 to 65%. The  left ventricle has normal function. The left ventricle has no regional  wall motion abnormalities. Left ventricular diastolic parameters are  consistent with Grade I diastolic  dysfunction (impaired relaxation). The average left ventricular global  longitudinal strain is -23.8 %. The global  longitudinal strain is normal.   2. Right ventricular systolic function is normal. The right ventricular  size is normal.   3. The mitral valve is normal in structure. No evidence of mitral valve  regurgitation. No evidence of mitral stenosis.   4. The aortic valve is tricuspid. Aortic valve regurgitation is not  visualized. No aortic stenosis is present.   5. Aortic dilatation noted. There is mild dilatation of the aortic root,  measuring 37 mm. There is mild dilatation of the ascending aorta,  measuring 37 mm.   6. The inferior vena cava is normal in size with <50% respiratory  variability, suggesting right atrial pressure of 8 mmHg.   _____________________________________   Narrative & Impression  CLINICAL DATA:  77 year old male with history of pulmonary fibrosis. Follow-up study.   EXAM: CT CHEST WITHOUT CONTRAST   TECHNIQUE: Multidetector CT imaging of the chest was performed following the standard protocol without intravenous contrast. High resolution imaging of the lungs, as well as inspiratory and expiratory imaging, was performed.   COMPARISON:  Cardiac CT 07/24/2019.   FINDINGS: Cardiovascular: Heart size is normal. There is no significant pericardial fluid, thickening or pericardial calcification. There is aortic atherosclerosis, as well as atherosclerosis of the great vessels of the mediastinum and the coronary arteries, including calcified atherosclerotic plaque in the left main, left anterior descending, left circumflex and right coronary arteries.   Mediastinum/Nodes: No pathologically enlarged mediastinal or hilar lymph nodes. Multiple prominent but nonenlarged mediastinal and hilar lymph nodes are incidentally noted. Esophagus is unremarkable in appearance. No axillary lymphadenopathy.   Lungs/Pleura: High-resolution images demonstrate widespread but patchy areas of ground-glass attenuation, septal thickening, subpleural reticulation, parenchymal  banding, traction bronchiectasis and frank honeycombing. Findings have a definitive craniocaudal gradient and are clearly progressive compared to the prior examination from 2020. Inspiratory and expiratory imaging is unremarkable. No acute consolidative airspace disease. No pleural effusions. No suspicious appearing pulmonary nodules or masses are confidently identified upon this background of fibrotic lung disease.   Upper Abdomen: Aortic atherosclerosis.   Musculoskeletal: There are no aggressive appearing lytic or blastic lesions noted in the visualized portions of the skeleton.  IMPRESSION: 1. The appearance of the lungs is considered diagnostic of usual interstitial pneumonia (UIP) per current ATS guidelines, with clear progression compared to the prior examination, as discussed above. 2. Aortic atherosclerosis, in addition to left main and 3 vessel coronary artery disease. Please note that although the presence of coronary artery calcium documents the presence of coronary artery disease, the severity of this disease and any potential stenosis cannot be assessed on this non-gated CT examination. Assessment for potential risk factor modification, dietary therapy or pharmacologic therapy may be warranted, if clinically indicated.   Aortic Atherosclerosis (ICD10-I70.0).     Electronically Signed   By: Vinnie Langton M.D.   On: 10/31/2021 08:23     Assessment: Encounter Diagnoses  Name Primary?   Personal history of colonic polyps Yes   Chronic diarrhea     History of colon polyps, last exam done by outside GI physician.  Surveillance exam at this point reasonable, and his pulmonary function is stable without the need for supplemental oxygen, and I believe he will tolerate sedation well with anesthesia support. Chronic diarrhea for least a decade, possibly IBS but sounds more like maldigestion since he notices it episodically.  Must consider microscopic colitis, no  biopsies taken on last colonoscopy to rule that out. Plan:  Colonoscopy.  He was agreeable after discussion of procedure and risks.  The benefits and risks of the planned procedure were described in detail with the patient or (when appropriate) their health care proxy.  Risks were outlined as including, but not limited to, bleeding, infection, perforation, adverse medication reaction leading to cardiac or pulmonary decompensation, pancreatitis (if ERCP).  The limitation of incomplete mucosal visualization was also discussed.  No guarantees or warranties were given.   Thank you for the courtesy of this consult.  Please call me with any questions or concerns.  Nelida Meuse III  CC: Referring provider noted above

## 2022-12-23 NOTE — Patient Instructions (Addendum)
_______________________________________________________  If your blood pressure at your visit was 140/90 or greater, please contact your primary care physician to follow up on this.  _______________________________________________________  If you are age 76 or older, your body mass index should be between 23-30. Your Body mass index is 32.41 kg/m. If this is out of the aforementioned range listed, please consider follow up with your Primary Care Provider.  If you are age 85 or younger, your body mass index should be between 19-25. Your Body mass index is 32.41 kg/m. If this is out of the aformentioned range listed, please consider follow up with your Primary Care Provider.   ________________________________________________________  The [facility] providers would like to encourage you to use [facility] to communicate with providers for non-urgent requests or questions.  Due to long hold times on the telephone, sending your provider a message by [facility] may be a faster and more efficient way to get a response.  Please allow 48 business hours for a response.  Please remember that this is for non-urgent requests.  _______________________________________________________  Dennis Bast have been scheduled for a colonoscopy. Please follow written instructions given to you at your visit today.  Please pick up your prep supplies at the pharmacy within the next 1-3 days. If you use inhalers (even only as needed), please bring them with you on the day of your procedure.  Due to recent changes in healthcare laws, you may see the results of your imaging and laboratory studies on MyChart before your provider has had a chance to review them.  We understand that in some cases there may be results that are confusing or concerning to you. Not all laboratory results come back in the same time frame and the provider may be waiting for multiple results in order to interpret others.  Please give [place] 48 hours in order for your  provider to thoroughly review all the results before contacting the office for clarification of your results.      _______________________________________________________  Food Guidelines for those with chronic digestive trouble:  Many people have difficulty digesting certain foods, causing a variety of distressing and embarrassing symptoms such as abdominal pain, bloating and gas.  These foods may need to be avoided or consumed in small amounts.  Here are some tips that might be helpful for you.  1.   Lactose intolerance is the difficulty or complete inability to digest lactose, the natural sugar in milk and anything made from milk.  This condition is harmless, common, and can begin any time during life.  Some people can digest a modest amount of lactose while others cannot tolerate any.  Also, not all dairy products contain equal amounts of lactose.  For example, hard cheeses such as parmesan have less lactose than soft cheeses such as cheddar.  Yogurt has less lactose than milk or cheese.  Many packaged foods (even many brands of bread) have milk, so read ingredient lists carefully.  It is difficult to test for lactose intolerance, so just try avoiding lactose as much as possible for a week and see what happens with your symptoms.  If you seem to be lactose intolerant, the best plan is to avoid it (but make sure you get calcium from another source).  The next best thing is to use lactase enzyme supplements, available over the counter everywhere.  Just know that many lactose intolerant people need to take several tablets with each serving of dairy to avoid symptoms.  Lastly, a lot of restaurant food is  made with milk or butter.  Many are things you might not suspect, such as mashed potatoes, rice and pasta (cooked with butter) and "grilled" items.  If you are lactose intolerant, it never hurts to ask your server what has milk or butter.  2.   Fiber is an important part of your diet, but not all fiber  is well-tolerated.  Insoluble fiber such as bran is often consumed by normal gut bacteria and converted into gas.  Soluble fiber such as oats, squash, carrots and green beans are typically tolerated better.  3.   Some types of carbohydrates can be poorly digested.  Examples include: fructose (apples, cherries, pears, raisins and other dried fruits), fructans (onions, zucchini, large amounts of wheat), sorbitol/mannitol/xylitol and sucralose/Splenda (common artificial sweeteners), and raffinose (lentils, broccoli, cabbage, asparagus, brussel sprouts, many types of beans).  Do a Development worker, community for [employer] and you will find helpful information. Beano, a dietary supplement, will often help with raffinose-containing foods.  As with lactase tablets, you may need several per serving.  4.   Whenever possible, avoid processed food&meats and chemical additives.  High fructose corn syrup, a common sweetener, may be difficult to digest.  Eggs and soy (comes from the soybean, and added to many foods now) are other common bloating/gassy foods.  5.  Regarding gluten:  gluten is a protein mainly found in wheat, but also rye and barley.  There is a condition called celiac sprue, which is an inflammatory reaction in the small intestine causing a variety of digestive symptoms.  Blood testing is highly reliable to look for this condition, and sometimes upper endoscopy with small bowel biopsies may be necessary to make the diagnosis.  Many patients who test negative for celiac sprue report improvement in their digestive symptoms when they switch to a gluten-free diet.  However, in these "non-celiac gluten sensitive" patients, the true role of gluten in their symptoms is unclear.  Reducing carbohydrates in general may decrease the gas and bloating caused when gut bacteria consume carbs. Also, some of these patients may actually be intolerant of the baker's yeast in bread products rather than the gluten.  Flatbread and other  reduced yeast breads might therefore be tolerated.  There is no specific testing available for most food intolerances, which are discovered mainly by dietary elimination.  Please do not embark on a gluten free diet unless directed by your doctor, as it is highly restrictive, and may lead to nutritional deficiencies if not carefully monitored.  Lastly, beware of internet claims offering "personalized" tests for food intolerances.  Such testing has no reliable scientific evidence to support its reliability and correlation to symptoms.    6.  The best advice is old advice, especially for those with chronic digestive trouble - try to eat "clean".  Balanced diet, avoid processed food, plenty of fruits and vegetables, cut down the sugar, minimal alcohol, avoid tobacco. Make time to care for yourself, get enough sleep, exercise when you can, reduce stress.  Your guts will thank you for it.   - Dr. [facility]  ____________________________________________________________

## 2022-12-24 NOTE — Telephone Encounter (Signed)
Submitted a Prior Authorization request to [facility] for ESBRIET via CoverMyMeds. Will update once we receive a response.  (Key: [id number]) - [id number]

## 2022-12-26 ENCOUNTER — Other Ambulatory Visit: Payer: Self-pay | Admitting: Family Medicine

## 2022-12-27 ENCOUNTER — Other Ambulatory Visit (HOSPITAL_COMMUNITY): Payer: Self-pay

## 2022-12-27 NOTE — Telephone Encounter (Signed)
Brand PA denied- Must have trial failure of generic.  Received notification from [facility] regarding a prior authorization for PIRFENIDONE. Authorization has been APPROVED from 11/22/22 to 11/22/23.   Per test claim, copay for 30 days supply is $221.12  Authorization # Key: [id number] - PA Case ID: [id number]  Phone # [phone]

## 2023-01-04 ENCOUNTER — Encounter (HOSPITAL_BASED_OUTPATIENT_CLINIC_OR_DEPARTMENT_OTHER): Payer: Self-pay

## 2023-01-04 ENCOUNTER — Ambulatory Visit (INDEPENDENT_AMBULATORY_CARE_PROVIDER_SITE_OTHER): Payer: Medicare HMO

## 2023-01-04 VITALS — Ht 67.5 in | Wt 210.0 lb

## 2023-01-04 DIAGNOSIS — Z0000 Encounter for general adult medical examination without abnormal findings: Secondary | ICD-10-CM

## 2023-01-04 NOTE — Progress Notes (Signed)
Medicare  Subjective:   Vincent Meyers is a 77 y.o. male who presents for Medicare Annual/Subsequent preventive examination.  Review of Systems    Virtual Visit via Telephone Note  I connected with  Vincent Meyers on 01/04/23 at  8:45 AM EST by telephone and verified that I am speaking with the correct person using two identifiers.  Location: Patient: Home Provider: Office Persons participating in the virtual visit: patient/Nurse Health Advisor   I discussed the limitations, risks, security and privacy concerns of performing an evaluation and management service by telephone and the availability of in person appointments. The patient expressed understanding and agreed to proceed.  Interactive audio and video telecommunications were attempted between this nurse and patient, however failed, due to patient having technical difficulties OR patient did not have access to video capability.  We continued and completed visit with audio only.  Some vital signs may be absent or patient reported.   Criselda Peaches, LPN  Cardiac Risk Factors include: advanced age (>37mn, >>5women);hypertension;male gender;Other (see comment), Risk factor comments: Dx COPD     Objective:    Today's Vitals   01/04/23 0917  Weight: 210 lb (95.3 kg)  Height: 5' 7.5" (1.715 m)   Body mass index is 32.41 kg/m.     01/04/2023    9:26 AM  Advanced Directives  Does Patient Have a Medical Advance Directive? No  Would patient like information on creating a medical advance directive? No - Patient declined    Current Medications (verified) Outpatient Encounter Medications as of 01/04/2023  Medication Sig   albuterol (VENTOLIN HFA) 108 (90 Base) MCG/ACT inhaler Inhale 1-2 puffs into the lungs every 6 (six) hours as needed.   amLODipine (NORVASC) 5 MG tablet Take 1 tablet (5 mg total) by mouth daily.   aspirin EC 81 MG tablet Take by mouth.   atenolol (TENORMIN) 50 MG tablet TAKE 1 AND 1/2 TABLETS BY MOUTH  DAILY   Flaxseed, Linseed, (FLAXSEED OIL PO) Take by mouth.   hydrochlorothiazide (HYDRODIURIL) 25 MG tablet TAKE 1 TABLET BY MOUTH EVERY DAY   lisinopril (ZESTRIL) 40 MG tablet TAKE 1 TABLET BY MOUTH EVERY DAY   Magnesium Gluconate 550 MG TABS Take by mouth.   Omega-3 1000 MG CAPS Take by mouth.   omeprazole (PRILOSEC) 20 MG capsule TAKE 1 CAPSULE BY MOUTH EVERY DAY   Probiotic Product (PROBIOTIC-10 PO) Take by mouth.   sildenafil (REVATIO) 20 MG tablet Take 1 tablet (20 mg total) by mouth daily as needed.   simvastatin (ZOCOR) 40 MG tablet TAKE 1 TABLET(40 MG) BY MOUTH EVERY NIGHT   Turmeric (QC TUMERIC COMPLEX PO) Take by mouth.   No facility-administered encounter medications on file as of 01/04/2023.    Allergies (verified) Patient has no known allergies.   History: Past Medical History:  Diagnosis Date   Anxiety    Arthritis    Diabetes mellitus without complication (HCC)    Erectile dysfunction    Hyperlipidemia    Hypertension    IBS (irritable bowel syndrome)    ILD (interstitial lung disease) (HCC)    OSA (obstructive sleep apnea)    Past Surgical History:  Procedure Laterality Date   APPENDECTOMY     COLON SURGERY     perforated bowel and hernia repair   ILIAC ARTERY ANEURYSM REPAIR Left 2006   Dr. EDonnetta Hutching(redo surgery)   REPLACEMENT TOTAL KNEE BILATERAL     TONSILLECTOMY     age 77  Family  History  Problem Relation Age of Onset   Parkinson's disease Mother    Heart attack Father    Stroke Father    Alzheimer's disease Father    Alzheimer's disease Sister    Other Sister        degenerative muscle disease   Hypertension Daughter    Obesity Daughter    Heart murmur Daughter    Social History   Socioeconomic History   Marital status: Widowed    Spouse name: Not on file   Number of children: 2   Years of education: Not on file   Highest education level: Not on file  Occupational History   Occupation: retired  Tobacco Use   Smoking status: Former     Packs/day: 2.00    Years: 23.00    Total pack years: 46.00    Types: Cigarettes    Start date: 1966    Quit date: 1989    Years since quitting: 35.1   Smokeless tobacco: Never  Vaping Use   Vaping Use: Never used  Substance and Sexual Activity   Alcohol use: Not Currently    Alcohol/week: 2.0 standard drinks of alcohol    Types: 2 Standard drinks or equivalent per week    Comment: quit 7 months ago stated 12/23/22   Drug use: Never   Sexual activity: Yes  Other Topics Concern   Not on file  Social History Narrative   Not on file   Social Determinants of Health   Financial Resource Strain: Low Risk  (01/04/2023)   Overall Financial Resource Strain (CARDIA)    Difficulty of Paying Living Expenses: Not hard at all  Food Insecurity: No Food Insecurity (01/04/2023)   Hunger Vital Sign    Worried About Running Out of Food in the Last Year: Never true    Ran Out of Food in the Last Year: Never true  Transportation Needs: No Transportation Needs (01/04/2023)   PRAPARE - Hydrologist (Medical): No    Lack of Transportation (Non-Medical): No  Physical Activity: Inactive (01/04/2023)   Exercise Vital Sign    Days of Exercise per Week: 0 days    Minutes of Exercise per Session: 0 min  Stress: No Stress Concern Present (01/04/2023)   [place]    Feeling of Stress : Not at all  Social Connections: [place] (01/04/2023)   Social Connection and Isolation Panel [NHANES]    Frequency of Communication with Friends and Family: More than three times a week    Frequency of Social Gatherings with Friends and Family: More than three times a week    Attends Religious Services: More than 4 times per year    Active Member of [employer] or Organizations: Yes    Attends Music therapist: More than 4 times per year    Marital Status: Married    Tobacco Counseling Counseling given: Not  Answered   Clinical Intake:  Pre-visit preparation completed: No  Pain : No/denies pain Nutrition Risk Assessment:  Has the patient had any N/V/D within the last 2 months?  No  Does the patient have any non-healing wounds?  No  Has the patient had any unintentional weight loss or weight gain?  No   Diabetes:  Is the patient diabetic?  Yes  If diabetic, was a CBG obtained today?  No  Did the patient bring in their glucometer from home?  No  How often do you  monitor your CBG's? PRN.   Financial Strains and Diabetes Management:  Are you having any financial strains with the device, your supplies or your medication? No .  Does the patient want to be seen by Chronic Care Management for management of their diabetes?  No  Would the patient like to be referred to a Nutritionist or for Diabetic Management?  No   Diabetic Exams:  Diabetic Eye Exam: Completed No. Overdue for diabetic eye exam. Pt has been advised about the importance in completing this exam. A referral has been placed today. Message sent to referral coordinator for scheduling purposes. Advised pt to expect a call from office referred to regarding appt.  Diabetic Foot Exam: Completed No. Pt has been advised about the importance in completing this exam. Pt is scheduled for diabetic foot exam on Followed by PCP.      BMI - recorded: 32.41 Nutritional Status: BMI > 30  Obese Nutritional Risks: None Diabetes: Yes CBG done?: No Did pt. bring in CBG monitor from home?: No  How often do you need to have someone help you when you read instructions, pamphlets, or other written materials from your doctor or pharmacy?: 1 - Never  Diabetic? Yes  Interpreter Needed?: No  Information entered by :: Rolene Arbour LPN   Activities of Daily Living    01/04/2023    9:24 AM 10/19/2022    9:46 AM  In your present state of health, do you have any difficulty performing the following activities:  Hearing? 0 0  Vision? 0 0   Difficulty concentrating or making decisions? 0 0  Walking or climbing stairs? 0 0  Dressing or bathing? 0 0  Doing errands, shopping? 0 0  Preparing Food and eating ? N   Using the Toilet? N   In the past six months, have you accidently leaked urine? N   Do you have problems with loss of bowel control? N   Managing your Medications? N   Managing your Finances? N   Housekeeping or managing your Housekeeping? N     Patient Care Team: [place], Blondell Reveal, MD as PCP - General (Family Medicine) [place], Dani Gobble, MD as PCP - Cardiology (Cardiology)  Indicate any recent Medical Services you may have received from other than [facility] providers in the past year (date may be approximate).     Assessment:   This is a routine wellness examination for Vincent Meyers.  Hearing/Vision screen Hearing Screening - Comments:: Denies hearing difficulties   Vision Screening - Comments:: Wears rx glasses - up to date with routine eye exams with  Dr Sharlet Salina  Dietary issues and exercise activities discussed: Current Exercise Habits: The patient does not participate in regular exercise at present, Exercise limited by: respiratory conditions(s)   Goals Addressed               This Visit's Progress     Increase physical activity (pt-stated)        Stay Healthy       Depression Screen    01/04/2023    9:23 AM 10/19/2022    9:46 AM 05/24/2022    1:59 PM 04/13/2022    8:34 AM 09/29/2021    3:39 PM  PHQ 2/9 Scores  PHQ - 2 Score 0 0 0 0 0  PHQ- 9 Score  0     Exception Documentation  Medical reason Medical reason Medical reason     Fall Risk    01/04/2023    9:25 AM 10/19/2022  9:46 AM 05/24/2022    1:59 PM 04/13/2022    8:33 AM  Fall Risk   Falls in the past year? 0 0 1 1  Number falls in past yr: 0 0 1 0  Injury with Fall? 0 0 1 1  Risk for fall due to : No Fall Risks No Fall Risks History of fall(s) History of fall(s)  Follow up Falls prevention discussed Falls evaluation completed Falls  evaluation completed Falls evaluation completed    [place]:  Any stairs in or around the home? Yes  If so, are there any without handrails? No  Home free of loose throw rugs in walkways, pet beds, electrical cords, etc? Yes  Adequate lighting in your home to reduce risk of falls? Yes   ASSISTIVE DEVICES UTILIZED TO PREVENT FALLS:  Life alert? No  Use of a cane, walker or w/c? No  Grab bars in the bathroom? Yes  Shower chair or bench in shower? No  Elevated toilet seat or a handicapped toilet? No   TIMED UP AND GO:  Was the test performed? No .     Cognitive Function:        01/04/2023    9:26 AM  6CIT Screen  What Year? 0 points  What month? 0 points  What time? 0 points  Count back from 20 0 points  Months in reverse 0 points  Repeat phrase 0 points  Total Score 0 points    Immunizations Immunization History  Administered Date(s) Administered   Fluad Quad(high Dose 65+) 08/23/2022   Influenza, High Dose Seasonal PF 10/24/2019   Influenza,inj,Quad PF,6-35 Mos 10/11/2016, 10/24/2018   Influenza,inj,quad, With Preservative 09/19/2015   Influenza,trivalent, recombinat, inj, PF 08/22/2017   PFIZER(Purple Top)SARS-COV-2 Vaccination 01/06/2020, 01/29/2020   Pneumococcal Conjugate-13 05/19/2016   Pneumococcal Polysaccharide-23 09/05/2012   Tdap 03/12/2015   Unspecified SARS-COV-2 Vaccination 08/16/2022   Zoster, Live 11/05/2013    TDAP status: Up to date  Flu Vaccine status: Up to date  Pneumococcal vaccine status: Up to date  Covid-19 vaccine status: Completed vaccines  Qualifies for Shingles Vaccine? Yes   Zostavax completed No   Shingrix Completed?: No.    Education has been provided regarding the importance of this vaccine. Patient has been advised to call insurance company to determine out of pocket expense if they have not yet received this vaccine. Advised may also receive vaccine at local pharmacy or Health Dept.  Verbalized acceptance and understanding.  Screening Tests Health Maintenance  Topic Date Due   Diabetic kidney evaluation - Urine ACR  01/14/2023   OPHTHALMOLOGY EXAM  01/04/2023 (Originally 07/24/1956)   COVID-19 Vaccine (4 - 2023-24 season) 01/20/2023 (Originally 10/11/2022)   Zoster Vaccines- Shingrix (1 of 2) 04/04/2023 (Originally 07/24/1997)   FOOT EXAM  04/14/2023   HEMOGLOBIN A1C  04/19/2023   Diabetic kidney evaluation - eGFR measurement  11/19/2023   Medicare Annual Wellness (AWV)  01/05/2024   DTaP/Tdap/Td (2 - Td or Tdap) 03/11/2025   COLONOSCOPY (Pts 45-15yr Insurance coverage will need to be confirmed)  10/29/2027   Pneumonia Vaccine 77 Years old  Completed   INFLUENZA VACCINE  Completed   Hepatitis C Screening  Completed   HPV VACCINES  Aged Out    Health Maintenance  Health Maintenance Due  Topic Date Due   Diabetic kidney evaluation - Urine ACR  01/14/2023    Colorectal cancer screening: Type of screening: Colonoscopy. Completed 10/28/17. Repeat every 10 years  Lung Cancer Screening: (  Low Dose CT Chest recommended if Age 50-80 years, 30 pack-year currently smoking OR have quit w/in 15years.) does qualify.   Lung Cancer Screening Referral: Deferred  Additional Screening:  Hepatitis C Screening: does qualify; Completed 11/18/22  Vision Screening: Recommended annual ophthalmology exams for early detection of glaucoma and other disorders of the eye. Is the patient up to date with their annual eye exam?  Yes  Who is the provider or what is the name of the office in which the patient attends annual eye exams? Dr Sharlet Salina If pt is not established with a provider, would they like to be referred to a provider to establish care? No .   Dental Screening: Recommended annual dental exams for proper oral hygiene  Community Resource Referral / Chronic Care Management:  CRR required this visit?  No   CCM required this visit?  No      Plan:     I have personally  reviewed and noted the following in the patient's chart:   Medical and social history Use of alcohol, tobacco or illicit drugs  Current medications and supplements including opioid prescriptions. Patient is not currently taking opioid prescriptions. Functional ability and status Nutritional status Physical activity Advanced directives List of other physicians Hospitalizations, surgeries, and ER visits in previous 12 months Vitals Screenings to include cognitive, depression, and falls Referrals and appointments  In addition, I have reviewed and discussed with patient certain preventive protocols, quality metrics, and best practice recommendations. A written personalized care plan for preventive services as well as general preventive health recommendations were provided to patient.     Criselda Peaches, LPN   [id number]   Nurse Notes: None

## 2023-01-04 NOTE — Patient Instructions (Addendum)
Vincent Meyers , Thank you for taking time to come for your Medicare Wellness Visit. I appreciate your ongoing commitment to your health goals. Please review the following plan we discussed and let me know if I can assist you in the future.   These are the goals we discussed:  Goals       Increase physical activity (pt-stated)      Stay Healthy        This is a list of the screening recommended for you and due dates:  Health Maintenance  Topic Date Due   Yearly kidney health urinalysis for diabetes  01/14/2023   Eye exam for diabetics  01/04/2023*   COVID-19 Vaccine (4 - 2023-24 season) 01/20/2023*   Zoster (Shingles) Vaccine (1 of 2) 04/04/2023*   Complete foot exam   04/14/2023   Hemoglobin A1C  04/19/2023   Yearly kidney function blood test for diabetes  11/19/2023   Medicare Annual Wellness Visit  01/05/2024   DTaP/Tdap/Td vaccine (2 - Td or Tdap) 03/11/2025   Colon Cancer Screening  10/29/2027   Pneumonia Vaccine  Completed   Flu Shot  Completed   Hepatitis C Screening: USPSTF Recommendation to screen - Ages 18-76 yo.  Completed   HPV Vaccine  Aged Out  *Topic was postponed. The date shown is not the original due date.    Advanced directives: Advance directive discussed with you today. Even though you declined this today, please call our office should you change your mind, and we can give you the proper paperwork for you to fill out.   Conditions/risks identified: None  Next appointment: Follow up in one year for your annual wellness visit.    Preventive Care 76 Years and Older, Male  Preventive care refers to lifestyle choices and visits with your health care provider that can promote health and wellness. What does preventive care include? A yearly physical exam. This is also called an annual well check. Dental exams once or twice a year. Routine eye exams. Ask your health care provider how often you should have your eyes checked. Personal lifestyle choices,  including: Daily care of your teeth and gums. Regular physical activity. Eating a healthy diet. Avoiding tobacco and drug use. Limiting alcohol use. Practicing safe sex. Taking low doses of aspirin every day. Taking vitamin and mineral supplements as recommended by your health care provider. What happens during an annual well check? The services and screenings done by your health care provider during your annual well check will depend on your age, overall health, lifestyle risk factors, and family history of disease. Counseling  Your health care provider may ask you questions about your: Alcohol use. Tobacco use. Drug use. Emotional well-being. Home and relationship well-being. Sexual activity. Eating habits. History of falls. Memory and ability to understand (cognition). Work and work Statistician. Screening  You may have the following tests or measurements: Height, weight, and BMI. Blood pressure. Lipid and cholesterol levels. These may be checked every 5 years, or more frequently if you are over 65 years old. Skin check. Lung cancer screening. You may have this screening every year starting at age 70 if you have a 30-pack-year history of smoking and currently smoke or have quit within the past 15 years. Fecal occult blood test (FOBT) of the stool. You may have this test every year starting at age 55. Flexible sigmoidoscopy or colonoscopy. You may have a sigmoidoscopy every 5 years or a colonoscopy every 10 years starting at age 15. Prostate cancer screening.  Recommendations will vary depending on your family history and other risks. Hepatitis C blood test. Hepatitis B blood test. Sexually transmitted disease (STD) testing. Diabetes screening. This is done by checking your blood sugar (glucose) after you have not eaten for a while (fasting). You may have this done every 1-3 years. Abdominal aortic aneurysm (AAA) screening. You may need this if you are a current or former  smoker. Osteoporosis. You may be screened starting at age 57 if you are at high risk. Talk with your health care provider about your test results, treatment options, and if necessary, the need for more tests. Vaccines  Your health care provider may recommend certain vaccines, such as: Influenza vaccine. This is recommended every year. Tetanus, diphtheria, and acellular pertussis (Tdap, Td) vaccine. You may need a Td booster every 10 years. Zoster vaccine. You may need this after age 79. Pneumococcal 13-valent conjugate (PCV13) vaccine. One dose is recommended after age 33. Pneumococcal polysaccharide (PPSV23) vaccine. One dose is recommended after age 24. Talk to your health care provider about which screenings and vaccines you need and how often you need them. This information is not intended to replace advice given to you by your health care provider. Make sure you discuss any questions you have with your health care provider. Document Released: 12/05/2015 Document Revised: 07/28/2016 Document Reviewed: 09/09/2015 Elsevier Interactive Patient Education  2017 [place] Prevention in the Home Falls can cause injuries. They can happen to people of all ages. There are many things you can do to make your home safe and to help prevent falls. What can I do on the outside of my home? Regularly fix the edges of walkways and driveways and fix any cracks. Remove anything that might make you trip as you walk through a door, such as a raised step or threshold. Trim any bushes or trees on the path to your home. Use bright outdoor lighting. Clear any walking paths of anything that might make someone trip, such as rocks or tools. Regularly check to see if handrails are loose or broken. Make sure that both sides of any steps have handrails. Any raised decks and porches should have guardrails on the edges. Have any leaves, snow, or ice cleared regularly. Use sand or salt on walking paths during  winter. Clean up any spills in your garage right away. This includes oil or grease spills. What can I do in the bathroom? Use night lights. Install grab bars by the toilet and in the tub and shower. Do not use towel bars as grab bars. Use non-skid mats or decals in the tub or shower. If you need to sit down in the shower, use a plastic, non-slip stool. Keep the floor dry. Clean up any water that spills on the floor as soon as it happens. Remove soap buildup in the tub or shower regularly. Attach bath mats securely with double-sided non-slip rug tape. Do not have throw rugs and other things on the floor that can make you trip. What can I do in the bedroom? Use night lights. Make sure that you have a light by your bed that is easy to reach. Do not use any sheets or blankets that are too big for your bed. They should not hang down onto the floor. Have a firm chair that has side arms. You can use this for support while you get dressed. Do not have throw rugs and other things on the floor that can make you trip. What can I  do in the kitchen? Clean up any spills right away. Avoid walking on wet floors. Keep items that you use a lot in easy-to-reach places. If you need to reach something above you, use a strong step stool that has a grab bar. Keep electrical cords out of the way. Do not use floor polish or wax that makes floors slippery. If you must use wax, use non-skid floor wax. Do not have throw rugs and other things on the floor that can make you trip. What can I do with my stairs? Do not leave any items on the stairs. Make sure that there are handrails on both sides of the stairs and use them. Fix handrails that are broken or loose. Make sure that handrails are as long as the stairways. Check any carpeting to make sure that it is firmly attached to the stairs. Fix any carpet that is loose or worn. Avoid having throw rugs at the top or bottom of the stairs. If you do have throw rugs,  attach them to the floor with carpet tape. Make sure that you have a light switch at the top of the stairs and the bottom of the stairs. If you do not have them, ask someone to add them for you. What else can I do to help prevent falls? Wear shoes that: Do not have high heels. Have rubber bottoms. Are comfortable and fit you well. Are closed at the toe. Do not wear sandals. If you use a stepladder: Make sure that it is fully opened. Do not climb a closed stepladder. Make sure that both sides of the stepladder are locked into place. Ask someone to hold it for you, if possible. Clearly mark and make sure that you can see: Any grab bars or handrails. First and last steps. Where the edge of each step is. Use tools that help you move around (mobility aids) if they are needed. These include: Canes. Walkers. Scooters. Crutches. Turn on the lights when you go into a dark area. Replace any light bulbs as soon as they burn out. Set up your furniture so you have a clear path. Avoid moving your furniture around. If any of your floors are uneven, fix them. If there are any pets around you, be aware of where they are. Review your medicines with your doctor. Some medicines can make you feel dizzy. This can increase your chance of falling. Ask your doctor what other things that you can do to help prevent falls. This information is not intended to replace advice given to you by your health care provider. Make sure you discuss any questions you have with your health care provider. Document Released: 09/04/2009 Document Revised: 04/15/2016 Document Reviewed: 12/13/2014 Elsevier Interactive Patient Education  2017 [employer].

## 2023-01-05 NOTE — Telephone Encounter (Signed)
Called patient regarding pirfenidone approval. Unable to reach. Left VM requesting return call.   Will submit PAP but expect denial due to step therapy with generic pirfenidone not meeting requirement for PAP assistance  Patient aware of nausea nad risk for sun sensitivity.  Knox Saliva, PharmD, MPH, BCPS, CPP Clinical Pharmacist (Rheumatology and Pulmonology)

## 2023-01-07 ENCOUNTER — Other Ambulatory Visit (HOSPITAL_COMMUNITY): Payer: Self-pay

## 2023-01-07 ENCOUNTER — Other Ambulatory Visit: Payer: Self-pay

## 2023-01-07 MED ORDER — PIRFENIDONE 267 MG PO TABS
ORAL_TABLET | ORAL | 0 refills | Status: DC
  Filled 2023-01-07: qty 207, fill #0
  Filled 2023-01-10: qty 207, 30d supply, fill #0

## 2023-01-07 NOTE — Telephone Encounter (Signed)
Spoke with patient. He is amenable to copay for pirfenidone. He has been advised of [employer] in case his copay becomes unaffordable.  Rx sent to [facility]. Routing to [place] for onboaridng needs  Knox Saliva, PharmD, MPH, BCPS, CPP Clinical Pharmacist (Rheumatology and Pulmonology)

## 2023-01-10 ENCOUNTER — Other Ambulatory Visit (HOSPITAL_COMMUNITY): Payer: Self-pay

## 2023-01-10 ENCOUNTER — Other Ambulatory Visit: Payer: Self-pay

## 2023-01-10 NOTE — Telephone Encounter (Signed)
Delivery instructions have been updated in [facility], medication will be shipped to  address provided by pt (which is different than his listed home address in Epic)  by 01/11/23.  Rx has been processed in [facility] and there is a copay of $164.90. Payment information has been collected and forwarded to the pharmacy.

## 2023-01-14 ENCOUNTER — Encounter: Payer: Self-pay | Admitting: Gastroenterology

## 2023-01-21 ENCOUNTER — Encounter: Payer: Self-pay | Admitting: Gastroenterology

## 2023-01-21 ENCOUNTER — Ambulatory Visit (AMBULATORY_SURGERY_CENTER): Payer: Medicare HMO | Admitting: Gastroenterology

## 2023-01-21 VITALS — BP 120/67 | HR 62 | Temp 98.6°F | Resp 15 | Ht 67.5 in | Wt 210.0 lb

## 2023-01-21 DIAGNOSIS — K529 Noninfective gastroenteritis and colitis, unspecified: Secondary | ICD-10-CM | POA: Diagnosis not present

## 2023-01-21 DIAGNOSIS — K5730 Diverticulosis of large intestine without perforation or abscess without bleeding: Secondary | ICD-10-CM | POA: Diagnosis not present

## 2023-01-21 DIAGNOSIS — Z09 Encounter for follow-up examination after completed treatment for conditions other than malignant neoplasm: Secondary | ICD-10-CM | POA: Diagnosis not present

## 2023-01-21 DIAGNOSIS — Z860100 Personal history of colon polyps, unspecified: Secondary | ICD-10-CM

## 2023-01-21 DIAGNOSIS — D122 Benign neoplasm of ascending colon: Secondary | ICD-10-CM

## 2023-01-21 MED ORDER — SODIUM CHLORIDE 0.9 % IV SOLN: 500.0000 mL | Freq: Once | INTRAVENOUS | Status: DC

## 2023-01-21 NOTE — Progress Notes (Signed)
Pt's states no medical or surgical changes since previsit or office visit. 

## 2023-01-21 NOTE — Progress Notes (Signed)
Vss nad trans to pacu °

## 2023-01-21 NOTE — Op Note (Signed)
[place] Patient Name: Vincent Meyers Procedure Date: 01/21/2023 11:07 AM MRN: [id number] Endoscopist: [place]. Loletha Carrow , MD, [id number] Age: 77 Referring MD:  Date of Birth: 09-09-1946 Gender: Male Account #: [id number] Procedure:                Colonoscopy Indications:              Surveillance: Personal history of adenomatous                            polyps on last colonoscopy > 5 years ago                           Clinical details and 12/23/2022 office consult note.                            Tubular adenoma x 2 in 2013, no polyps in 2018                            (both exams at an outside GI practice)                           Incidental diarrhea noted (see note for details) Medicines:                Monitored Anesthesia Care Procedure:                Pre-Anesthesia Assessment:                           - Prior to the procedure, a History and Physical                            was performed, and patient medications and                            allergies were reviewed. The patient's tolerance of                            previous anesthesia was also reviewed. The risks                            and benefits of the procedure and the sedation                            options and risks were discussed with the patient.                            All questions were answered, and informed consent                            was obtained. Prior Anticoagulants: The patient has                            taken no anticoagulant or antiplatelet agents. ASA  Grade Assessment: III - A patient with severe                            systemic disease. After reviewing the risks and                            benefits, the patient was deemed in satisfactory                            condition to undergo the procedure.                           After obtaining informed consent, the colonoscope                            was passed under direct vision.  Throughout the                            procedure, the patient's blood pressure, pulse, and                            oxygen saturations were monitored continuously. The                            Olympus CF-HQ190L [id number]) Colonoscope was                            introduced through the anus and advanced to the the                            cecum, identified by appendiceal orifice and                            ileocecal valve. The colonoscopy was somewhat                            difficult due to a redundant colon. Successful                            completion of the procedure was aided by using                            manual pressure and straightening and shortening                            the scope to obtain bowel loop reduction. The                            patient tolerated the procedure well. The quality                            of the bowel preparation was good in the majority  of the colon, but fair in the proximal to mid                            ascending colon and cecum (opaque liquid and                            fibrous debris). The ileocecal valve, appendiceal                            orifice, and rectum were photographed. The bowel                            preparation used was SUPREP via split dose                            instruction. Scope In: 11:24:27 AM Scope Out: 11:55:22 AM Scope Withdrawal Time: 0 hours 26 minutes 44 seconds  Total Procedure Duration: 0 hours 30 minutes 55 seconds  Findings:                 The perianal and digital rectal examinations were                            normal.                           The descending colon was redundant.                           Normal mucosa was found in the entire colon.                            Biopsies for histology were taken with a cold                            forceps from the right colon and left colon for                            evaluation of  microscopic colitis.                           There was evidence of a prior end-to-end                            colo-colonic anastomosis in the sigmoid colon. This                            was patent and was characterized by healthy                            appearing mucosa. There were several diverticuli in                            this area.  A 15 mm polyp was found in the proximal ascending                            colon. The polyp was sessile. Preparations were                            made for mucosal resection. Demarcation of the                            lesion was performed with high-definition white                            light and narrow band imaging to clearly identify                            the boundaries of the lesion. 5 cc of normal saline                            was injected to raise the lesion. Snare mucosal                            resection was performed. Piecemeal resection and                            retrieval were complete. Resected tissue margins                            were examined and clear of polyp tissue.                            Polypectomy edges treated with STSC.                           The exam was otherwise without abnormality on                            direct and retroflexion views. Complications:            No immediate complications. Estimated Blood Loss:     Estimated blood loss was minimal. Impression:               - Redundant colon.                           - Normal mucosa in the entire examined colon.                            Biopsied.                           - Patent end-to-end colo-colonic anastomosis,                            characterized by healthy appearing mucosa.                           -  One 15 mm polyp in the proximal ascending colon,                            removed with mucosal resection. Resected and                            retrieved.                            - The examination was otherwise normal on direct                            and retroflexion views.                           - Mucosal resection was performed. Resection and                            retrieval were complete. Recommendation:           - Patient has a contact number available for                            emergencies. The signs and symptoms of potential                            delayed complications were discussed with the                            patient. Return to normal activities tomorrow.                            Written discharge instructions were provided to the                            patient.                           - Resume previous diet.                           - Continue present medications.                           - No aspirin, ibuprofen, naproxen, or other                            non-steroidal anti-inflammatory drugs for 7 days                            after polyp removal.                           - Await pathology results.                           - Repeat colonoscopy is recommended for  surveillance. The colonoscopy date will be                            determined after pathology results from today's                            exam become available for review. (Likely 6 to 12                            months with EMR of right colon polyp)                           - Return to my office at appointment to be                            scheduled. Faris Coolman L. Loletha Carrow, MD 01/21/2023 12:04:53 PM This report has been signed electronically.

## 2023-01-21 NOTE — Progress Notes (Signed)
Called to room to assist during endoscopic procedure.  Patient ID and intended procedure confirmed with present staff. Received instructions for my participation in the procedure from the performing physician.  

## 2023-01-21 NOTE — Progress Notes (Signed)
No changes to clinical history since GI office visit on 12/23/22.  The patient is appropriate for an endoscopic procedure in the ambulatory setting.  - Wilfrid Lund, MD

## 2023-01-21 NOTE — Patient Instructions (Addendum)
Handout on polyps given to patient.  Await pathology results. Repeat colonoscopy is recommended for surveillance - date will be determined based off pathology results. (Likely 6-12 months with EMR of right colon polyp) If appointment time for follow-up doesn't work for you - please call office to reschedule  Resume previous diet and continue present medications.   YOU HAD AN ENDOSCOPIC PROCEDURE TODAY AT [facility]:   Refer to the procedure report that was given to you for any specific questions about what was found during the examination.  If the procedure report does not answer your questions, please call your gastroenterologist to clarify.  If you requested that your care partner not be given the details of your procedure findings, then the procedure report has been included in a sealed envelope for you to review at your convenience later.  YOU SHOULD EXPECT: Some feelings of bloating in the abdomen. Passage of more gas than usual.  Walking can help get rid of the air that was put into your GI tract during the procedure and reduce the bloating. If you had a lower endoscopy (such as a colonoscopy or flexible sigmoidoscopy) you may notice spotting of blood in your stool or on the toilet paper. If you underwent a bowel prep for your procedure, you may not have a normal bowel movement for a few days.  Please Note:  You might notice some irritation and congestion in your nose or some drainage.  This is from the oxygen used during your procedure.  There is no need for concern and it should clear up in a day or so.  SYMPTOMS TO REPORT IMMEDIATELY:  Following lower endoscopy (colonoscopy or flexible sigmoidoscopy):  Excessive amounts of blood in the stool  Significant tenderness or worsening of abdominal pains  Swelling of the abdomen that is new, acute  Fever of 100F or higher  For urgent or emergent issues, a gastroenterologist can be reached at any hour by calling [phone]  [phone]. Do not use MyChart messaging for urgent concerns.    DIET:  We do recommend a small meal at first, but then you may proceed to your regular diet.  Drink plenty of fluids but you should avoid alcoholic beverages for 24 hours.  ACTIVITY:  You should plan to take it easy for the rest of today and you should NOT DRIVE or use heavy machinery until tomorrow (because of the sedation medicines used during the test).    FOLLOW UP: Our staff will call the number listed on your records the next business day following your procedure.  We will call around 7:15- 8:00 am to check on you and address any questions or concerns that you may have regarding the information given to you following your procedure. If we do not reach you, we will leave a message.     If any biopsies were taken you will be contacted by phone or by letter within the next 1-3 weeks.  Please call us at [phone] if you have not heard about the biopsies in 3 weeks.    SIGNATURES/CONFIDENTIALITY: You and/or your care partner have signed paperwork which will be entered into your electronic medical record.  These signatures attest to the fact that that the information above on your After Visit Summary has been reviewed and is understood.  Full responsibility of the confidentiality of this discharge information lies with you and/or your care-partner.

## 2023-01-24 ENCOUNTER — Telehealth: Payer: Self-pay

## 2023-01-24 NOTE — Telephone Encounter (Signed)
  Follow up Call-     01/21/2023   10:39 AM  Call back number  Post procedure Call Back phone  # [phone]  Permission to leave phone message Yes     Patient questions:  Do you have a fever, pain , or abdominal swelling? No. Pain Score  0 *  Have you tolerated food without any problems? Yes.    Have you been able to return to your normal activities? Yes.    Do you have any questions about your discharge instructions: Diet   No. Medications  No. Follow up visit  No.  Do you have questions or concerns about your Care? No.  Actions: * If pain score is 4 or above: No action needed, pain <4.

## 2023-01-25 ENCOUNTER — Ambulatory Visit
Admission: RE | Admit: 2023-01-25 | Discharge: 2023-01-25 | Disposition: A | Discharge status: Home / Self Care | Payer: Medicare HMO | Source: Ambulatory Visit | Attending: Internal Medicine | Admitting: Internal Medicine

## 2023-01-25 DIAGNOSIS — J849 Interstitial pulmonary disease, unspecified: Secondary | ICD-10-CM

## 2023-01-25 DIAGNOSIS — J479 Bronchiectasis, uncomplicated: Secondary | ICD-10-CM | POA: Diagnosis not present

## 2023-01-25 DIAGNOSIS — J8410 Pulmonary fibrosis, unspecified: Secondary | ICD-10-CM | POA: Diagnosis not present

## 2023-01-25 DIAGNOSIS — J439 Emphysema, unspecified: Secondary | ICD-10-CM | POA: Diagnosis not present

## 2023-01-25 DIAGNOSIS — J84112 Idiopathic pulmonary fibrosis: Secondary | ICD-10-CM

## 2023-01-26 ENCOUNTER — Encounter: Payer: Self-pay | Admitting: Gastroenterology

## 2023-01-27 ENCOUNTER — Ambulatory Visit (HOSPITAL_COMMUNITY): Payer: Medicare HMO

## 2023-02-01 ENCOUNTER — Other Ambulatory Visit (HOSPITAL_COMMUNITY): Payer: Self-pay

## 2023-02-01 ENCOUNTER — Encounter: Payer: Self-pay | Admitting: Internal Medicine

## 2023-02-01 ENCOUNTER — Ambulatory Visit: Payer: Medicare HMO | Admitting: Internal Medicine

## 2023-02-01 ENCOUNTER — Ambulatory Visit (INDEPENDENT_AMBULATORY_CARE_PROVIDER_SITE_OTHER): Payer: Medicare HMO | Admitting: Internal Medicine

## 2023-02-01 VITALS — BP 150/70 | HR 75 | Temp 98.0°F | Ht 67.5 in | Wt 209.6 lb

## 2023-02-01 DIAGNOSIS — K521 Toxic gastroenteritis and colitis: Secondary | ICD-10-CM

## 2023-02-01 DIAGNOSIS — J849 Interstitial pulmonary disease, unspecified: Secondary | ICD-10-CM | POA: Diagnosis not present

## 2023-02-01 DIAGNOSIS — Z5181 Encounter for therapeutic drug level monitoring: Secondary | ICD-10-CM | POA: Diagnosis not present

## 2023-02-01 DIAGNOSIS — J84112 Idiopathic pulmonary fibrosis: Secondary | ICD-10-CM

## 2023-02-01 LAB — PULMONARY FUNCTION TEST
DL/VA % pred: 89 %
DL/VA: 3.58 ml/min/mmHg/L
DLCO cor % pred: 62 %
DLCO cor: 14.61 ml/min/mmHg
DLCO unc % pred: 62 %
DLCO unc: 14.61 ml/min/mmHg
FEF 25-75 Post: 1.45 L/sec
FEF 25-75 Pre: 1.45 L/sec
FEF2575-%Change-Post: 0 %
FEF2575-%Pred-Post: 72 %
FEF2575-%Pred-Pre: 72 %
FEV1-%Change-Post: -3 %
FEV1-%Pred-Post: 71 %
FEV1-%Pred-Pre: 74 %
FEV1-Post: 1.97 L
FEV1-Pre: 2.04 L
FEV1FVC-%Change-Post: -4 %
FEV1FVC-%Pred-Pre: 98 %
FEV6-%Change-Post: 0 %
FEV6-%Pred-Post: 80 %
FEV6-%Pred-Pre: 79 %
FEV6-Post: 2.86 L
FEV6-Pre: 2.84 L
FEV6FVC-%Pred-Post: 107 %
FEV6FVC-%Pred-Pre: 107 %
FVC-%Change-Post: 1 %
FVC-%Pred-Post: 75 %
FVC-%Pred-Pre: 74 %
FVC-Post: 2.87 L
FVC-Pre: 2.84 L
Post FEV1/FVC ratio: 69 %
Post FEV6/FVC ratio: 100 %
Pre FEV1/FVC ratio: 72 %
Pre FEV6/FVC Ratio: 100 %
RV % pred: 64 %
RV: 1.55 L
TLC % pred: 68 %
TLC: 4.5 L

## 2023-02-01 LAB — HEPATIC FUNCTION PANEL
ALT: 20 U/L (ref 0–53)
AST: 23 U/L (ref 0–37)
Albumin: 4 g/dL (ref 3.5–5.2)
Alkaline Phosphatase: 64 U/L (ref 39–117)
Bilirubin, Direct: 0.1 mg/dL (ref 0.0–0.3)
Total Bilirubin: 0.5 mg/dL (ref 0.2–1.2)
Total Protein: 6.9 g/dL (ref 6.0–8.3)

## 2023-02-01 NOTE — Progress Notes (Signed)
Full PFT performed today. °

## 2023-02-01 NOTE — Patient Instructions (Addendum)
ICD-10-CM   1. 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 Encounter for therapeutic drug monitoring  Z51.81     4. Diarrhea due to drug  K52.1       Pulmonary fibrosis is been slowly progressive between 2020 and 2024.  There is at least a greater than 10% decline in pulmonary function test between 2023 and March 2024.  Glad you are on pirfenidone but did notice that you are having diarrhea at the 6 pills/day dosing  Plan - Do liver function test today - Stay on 6 pills [2 pills 3 times daily] for at least another 2 weeks and then increase to 9 pills [3 pills 3 times daily]  -This is because of the onset of diarrhea -Repeat liver function test another 4-6 weeks -Keep GI appointment for your diarrhea which is some chronic element to it -Pulmonary rehabilitation at [facility] in [place] -Consider clinical trials in the future  Follow-up - Return in 6 weeks to see nurse practitioner for continued therapeutic monitoring and progress -

## 2023-02-01 NOTE — Patient Instructions (Signed)
Full PFT performed today. °

## 2023-02-01 NOTE — Progress Notes (Signed)
OV 12/20/2022 -evaluation of ILD care.  Referred by Dr. Halford Chessman from [place] pulmonary location.  Subjective:  Patient ID: Vincent Meyers, male , DOB: 03/04/1946 , age 77 y.o. 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 Last CT scan was 2022. Echocardiogram was 11/11/2022. Pt is only on Albuterol as needed.      HPI Vincent Meyers 77 y.o. -history is gained talking to the patient and review of the external record.  He tells me that his primary care physician show told him some 5 years ago that he had scarring in the lung.  Then 2 years ago he was in [place] and got hospitalized for unrelated reasons and was told he had pulmonary fibrosis.  After that he did see Dr. Halford Chessman.  So he feels that he has had 5 years worth of insidious onset of shortness of breath that is slowly progressive.  More progressive in the last year or 2.  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      [facility] Comprehensive ILD Questionnaire  Symptoms:   Past Medical History :  - He does have sleep apnea and uses CPAP -sees Dr. Halford Chessman -He has irritable bowel syndrome and diarrhea.  GI referral in [place] is pending. -Does have acid reflux disease - Has cardiac history not otherwise specified. -Has had COVID in May 2022. -Denies any asthma or COPD.  Has an albuterol inhaler that does not help him.  ROS:  -Has fatigue and chronic pain particularly in the knees and shoulders. - Sometimes liquid just try to go down the windpipe and he chokes.  He  does have diarrhea with irritable bowel syndrome - He had dry mouth after COVID but he does not have that now. - Does have acid reflux  FAMILY HISTORY of LUNG DISEASE:  -Only he has pulmonary fibrosis but otherwise no lung disease in the family  PERSONAL EXPOSURE HISTORY:  -Smoke.  [place].  Greater than 40 pack smoking history.  He has done some mild marijuana in the past.  Never used cocaine.  Never used intravenous drugs.  HOME  EXPOSURE and HOBBY DETAILS :  [place]  He is lived there for 5 years but the home is 58 years.  Previously lived in a 76 year old home.  He believes that might have been some mold or mildew in the shower curtain but is never seen there.  He does use a CPAP but there is no mold or mildew in it.  Detail organic and inorganic antigen exposure history in the house is negative.  OCCUPATIONAL HISTORY (122 questions) : -He is retired but he is worked in [employer].  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  He goes to the shop every few days a week.  He does have a motor home and travel.  He has not been to [place].  No agent orange exposure but he does have some oil heater exposure.  There are some woodwork exposure.  Machinist exposure present.  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  He has done some welding on course.  PULMONARY TOXICITY HISTORY (27 items):  Denies  INVESTIGATIONS: -Last PFT was 1 year ago and compared to 2021 was relatively stable -Last CT scan of the chest December 2022 but this shows progression over the 2 years since 2020     Latest Reference Range & Units 06/02/21 12:11  Anti Nuclear Antibody (ANA) Negative  Negative  RA Latex Turbid. <14 IU/mL <14  Scleroderma (Scl-70) (ENA) Antibody, IgG <1.0 NEG AI <1.0 NEG   HRCT  - last Dec 2022   Narrative &  Impression  CLINICAL DATA:  77 year old male with history of pulmonary fibrosis. Follow-up study.   EXAM: CT CHEST WITHOUT CONTRAST   TECHNIQUE: Multidetector CT imaging of the chest was performed following the standard protocol without intravenous contrast. High resolution imaging of the lungs, as well as inspiratory and expiratory imaging, was performed.   COMPARISON:  Cardiac CT 07/24/2019.   FINDINGS: Cardiovascular: Heart size is normal. There is no significant pericardial fluid, thickening or pericardial calcification. There is aortic atherosclerosis, as well as atherosclerosis of the great vessels of the mediastinum and the coronary arteries, including calcified atherosclerotic plaque in the left main, left anterior descending, left circumflex and right coronary arteries.   Mediastinum/Nodes: No pathologically enlarged mediastinal or hilar lymph nodes. Multiple prominent but nonenlarged mediastinal and hilar lymph nodes are incidentally noted. Esophagus is unremarkable in appearance. No axillary lymphadenopathy.   Lungs/Pleura: High-resolution images demonstrate widespread but patchy areas of ground-glass attenuation, septal thickening, subpleural reticulation, parenchymal banding, traction bronchiectasis and frank honeycombing. Findings have a definitive craniocaudal gradient and are clearly progressive compared to the prior examination from 2020. Inspiratory and expiratory imaging is unremarkable. No acute consolidative airspace disease. No pleural effusions. No suspicious appearing pulmonary nodules or masses are confidently identified upon this background of fibrotic lung disease.   Upper Abdomen: Aortic atherosclerosis.   Musculoskeletal: There are no aggressive appearing lytic or blastic lesions noted in the visualized portions of the skeleton.   IMPRESSION: 1. The appearance of the lungs is considered diagnostic of usual interstitial pneumonia (UIP) per  current ATS guidelines, with clear progression compared to the prior examination, as discussed above. 2. Aortic atherosclerosis, in addition to left main and 3 vessel coronary artery disease. Please note that although the presence of coronary artery calcium documents the presence of coronary artery disease, the severity of this disease and any potential stenosis cannot be assessed on this non-gated CT examination. Assessment for potential risk factor modification, dietary therapy or pharmacologic therapy may be warranted, if clinically indicated.   Aortic Atherosclerosis (ICD10-I70.0).     Electronically Signed   By: Vinnie Langton M.D.   On: 10/31/2021 08:23   ECHO dec 2023    IMPRESSIONS     1. Left ventricular ejection fraction, by estimation, is 60 to 65%. The  left ventricle has normal function. The left ventricle has no regional  wall motion abnormalities. Left ventricular diastolic parameters are  consistent with Grade I diastolic  dysfunction (impaired relaxation). The average left ventricular global  longitudinal strain is -23.8 %. The global longitudinal strain is normal.   2. Right ventricular systolic function is normal. The right ventricular  size is normal.  3. The mitral valve is normal in structure. No evidence of mitral valve  regurgitation. No evidence of mitral stenosis.   4. The aortic valve is tricuspid. Aortic valve regurgitation is not  visualized. No aortic stenosis is present.   5. Aortic dilatation noted. There is mild dilatation of the aortic root,  measuring 37 mm. There is mild dilatation of the ascending aorta,  measuring 37 mm.   6. The inferior vena cava is normal in size with <50% respiratory  variability, suggesting right atrial pressure of 8 mmHg.    OV 02/01/2023  Subjective:  Patient ID: Vincent Meyers, male , DOB: 1946-11-17 , age 54 y.o. , MRN: [id number] , ADDRESS: [place] [id number] PCP [place],  Blondell Reveal, MD Patient Care Team: [place], Blondell Reveal, MD as PCP - General (Family Medicine) Sanda Klein, MD as PCP - Cardiology (Cardiology)  This Provider for this visit: Treatment Team:  Attending Provider: Brand Males, MD    IPF dx 12/20/22: Based on age greater than 9, Caucasian, male gender, type of occupation, previous smoking, UIP and progression this is IPF especially with negative serology.  -Started pirfenidone mid to late February 2024  02/01/2023 -   Chief Complaint  Patient presents with   Follow-up    PFT f/up     HPI Vincent Meyers 77 y.o. -presents for follow-up.  He is on week 2 of pirfenidone.  He is now on 6 pills 3 times daily.  He is already noticing that his diarrhea has gone up.  Otherwise symptoms are stable compared to January 2024.  He did a pulmonary function test and his FVC shows a 13% decline in the last 1 year.  Although the CT scan of the chest states there is no decline in the last 1 year but there is progression on even on the CT scan from 2020.  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  He is behaving like a classic IPF patient.  He says he does do some water walking at [place] center and he does not feel dyspneic but when he does activity he feels dyspneic.  He asked about taking an inhaler for his dyspnea.  I visualized the scan he does not have any emphysema.  Therefore we decided not to add any long-acting inhalers for him.  We discussed pulmonary rehabilitation and he is willing to do that.  Of note he does have chronic diarrhea.  He has seen Dr. Loletha Carrow for this at [facility].  He has upcoming visit with him.  He will have liver function test today.  He is open to the idea of clinical trials.       SYMPTOM SCALE - ILD 12/20/2022 02/01/2023 ESBIRET X 2 WEEKS  Current weight    O2 use 0-uses CPAP at night but without oxygen.   Shortness of Breath 0 -> 5 scale with 5 being worst (score 6 If unable to do)   At rest 1 0  Simple tasks -  showers, clothes change, eating, shaving 2 1  Household (dishes, doing bed, laundry) 3 3  Shopping 2 1  Walking level at own pace 2 2  Walking up Stairs 4 4  Total (30-36) Dyspnea Score 14 11      Non-dyspnea symptoms (0-> 5 scale) 12/20/2022 02/01/2023   How bad is your cough? 1 NONE  How bad is your fatigue 2 MILD-MOD  How bad is nausea 0 NONE  How bad is vomiting?  0  NONE  How bad is diarrhea? 3 BAD AND WORSE AFTER ESBRIET  How bad is anxiety? 0 OK  How bad is depression 1 NONE  Any chronic pain - if so where and how bad 2     CT Chest data - HRCT 01/25/23  Narrative & Impression  CLINICAL DATA:  77 year old male former smoker (quit 35 years ago) with interstitial lung disease. Follow-up study.   EXAM: CT CHEST WITHOUT CONTRAST   TECHNIQUE: Multidetector CT imaging of the chest was performed following the standard protocol without intravenous contrast. High resolution imaging of the lungs, as well as inspiratory and expiratory imaging, was performed.   RADIATION DOSE REDUCTION: This exam was performed according to the departmental dose-optimization program which includes automated exposure control, adjustment of the mA and/or kV according to patient size and/or use of iterative reconstruction technique.   COMPARISON:  High-resolution chest CT 10/30/2021.   FINDINGS: Cardiovascular: Heart size is normal. There is no significant pericardial fluid, thickening or pericardial calcification. There is aortic atherosclerosis, as well as atherosclerosis of the great vessels of the mediastinum and the coronary arteries, including calcified atherosclerotic plaque in the left main, left anterior descending, left circumflex and right coronary arteries.   Mediastinum/Nodes: Multiple prominent borderline enlarged mediastinal and hilar lymph nodes, similar to the prior study, presumably chronic and benign in the setting of interstitial lung disease. Esophagus is unremarkable in  appearance. No axillary lymphadenopathy.   Lungs/Pleura: High-resolution images again demonstrate widespread but patchy areas of ground-glass attenuation, septal thickening, subpleural reticulation, parenchymal banding, traction bronchiectasis, peripheral bronchiolectasis and some scattered areas of honeycombing (most severe in the base of the right lower lobe). These findings have a definitive craniocaudal gradient and are grossly stable compared to the prior study. No acute consolidative airspace disease. No pleural effusions. No definite suspicious appearing pulmonary nodules or masses. A small bulla in the periphery of the right lower lobe seen on the prior study has substantially enlarged compared to the prior examination.   Upper Abdomen: Aortic atherosclerosis.   Musculoskeletal: There are no aggressive appearing lytic or blastic lesions noted in the visualized portions of the skeleton.   IMPRESSION: 1. Fibrotic changes in the lungs appear grossly stable compared to the prior study, once again considered diagnostic of usual interstitial pneumonia (UIP) per current ATS guidelines. 2. Aortic atherosclerosis, in addition to left main and three-vessel coronary artery disease. Please note that although the presence of coronary artery calcium documents the presence of coronary artery disease, the severity of this disease and any potential stenosis cannot be assessed on this non-gated CT examination. Assessment for potential risk factor modification, dietary therapy or pharmacologic therapy may be warranted, if clinically indicated.   Aortic Atherosclerosis (ICD10-I70.0).     Electronically Signed   By: Vinnie Langton M.D.   On: 01/25/2023 11:54    No results found.    PFT     Latest Ref Rng & Units 02/01/2023    8:57 AM 11/26/2021    2:58 PM 11/17/2020    2:54 PM  PFT Results  FVC-Pre L 2.84  P 3.27  P 3.28   FVC-Predicted Pre % 74  P 83  P 82   FVC-Post L 2.87   P 3.31  P 3.16   FVC-Predicted Post % 75  P 83  P 79   Pre FEV1/FVC % % 72  P 68  P 83   Post FEV1/FCV % % 69  P 87  P 85   FEV1-Pre L  2.04  P 2.22  P 2.73   FEV1-Predicted Pre % 74  P 77  P 94   FEV1-Post L 1.97  P 2.89  P 2.69   DLCO uncorrected ml/min/mmHg 14.61  P 15.89  P 16.02   DLCO UNC% % 62  P 66  P 67   DLCO corrected ml/min/mmHg 14.61  P 15.89  P 16.02   DLCO COR %Predicted % 62  P 66  P 67   DLVA Predicted % 89  P 94  P 88   TLC L 4.50  P 4.78  P 4.36   TLC % Predicted % 68  P 72  P 65   RV % Predicted % 64  P 62  P 47     P Preliminary result       has a past medical history of Angina pectoris ([place]), Anxiety, Arthritis, Diabetes mellitus without complication ([place]), Erectile dysfunction, Hyperlipidemia, Hypertension, IBS (irritable bowel syndrome), ILD (interstitial lung disease) ([place]), and OSA (obstructive sleep apnea).   reports that he quit smoking about 35 years ago. His smoking use included cigarettes. He started smoking about 58 years ago. He has a 46.00 pack-year smoking history. He has never used smokeless tobacco.  Past Surgical History:  Procedure Laterality Date   APPENDECTOMY     COLON SURGERY     perforated bowel and hernia repair   ILIAC ARTERY ANEURYSM REPAIR Left 2006   Dr. Donnetta Hutching (redo surgery)   REPLACEMENT TOTAL KNEE BILATERAL     TONSILLECTOMY     age 8    No Known Allergies  Immunization History  Administered Date(s) Administered   Fluad Quad(high Dose 65+) 08/23/2022   Influenza Split 09/19/2015   Influenza, High Dose Seasonal PF 10/24/2019   Influenza, Quadrivalent, Recombinant, Inj, Pf 08/22/2017   Influenza,inj,Quad PF,6+ Mos 10/11/2016, 10/24/2018   Influenza,inj,Quad PF,6-35 Mos 10/11/2016, 10/24/2018   Influenza,inj,quad, With Preservative 09/19/2015   Influenza,trivalent, recombinat, inj, PF 08/22/2017   PFIZER(Purple Top)SARS-COV-2 Vaccination 01/06/2020, 01/29/2020   Pneumococcal Conjugate-13 05/19/2016   Pneumococcal  Polysaccharide-23 09/05/2012   Tdap 03/12/2015   Unspecified SARS-COV-2 Vaccination 08/16/2022   Zoster, Live 11/05/2013    Family History  Problem Relation Age of Onset   Parkinson's disease Mother    Heart attack Father    Stroke Father    Alzheimer's disease Father    Alzheimer's disease Sister    Other Sister        degenerative muscle disease   Hypertension Daughter    Obesity Daughter    Heart murmur Daughter    Colon cancer Neg Hx    Esophageal cancer Neg Hx    Rectal cancer Neg Hx    Stomach cancer Neg Hx      Current Outpatient Medications:    albuterol (VENTOLIN HFA) 108 (90 Base) MCG/ACT inhaler, Inhale 1-2 puffs into the lungs every 6 (six) hours as needed., Disp: 8 g, Rfl: 2   amLODipine (NORVASC) 5 MG tablet, Take 1 tablet (5 mg total) by mouth daily., Disp: 90 tablet, Rfl: 1   aspirin EC 81 MG tablet, Take by mouth., Disp: , Rfl:    atenolol (TENORMIN) 50 MG tablet, TAKE 1 AND 1/2 TABLETS BY MOUTH DAILY, Disp: 135 tablet, Rfl: 1   Flaxseed, Linseed, (FLAXSEED OIL PO), Take by mouth., Disp: , Rfl:    hydrochlorothiazide (HYDRODIURIL) 25 MG tablet, TAKE 1 TABLET BY MOUTH EVERY DAY, Disp: 90 tablet, Rfl: 0   lisinopril (ZESTRIL) 40 MG tablet, TAKE 1 TABLET  BY MOUTH EVERY DAY, Disp: 90 tablet, Rfl: 1   Magnesium Gluconate 550 MG TABS, Take by mouth., Disp: , Rfl:    omeprazole (PRILOSEC) 20 MG capsule, TAKE 1 CAPSULE BY MOUTH EVERY DAY, Disp: 90 capsule, Rfl: 1   Pirfenidone 267 MG TABS, Take 1 tab three times daily for 7 days, then 2 tabs three times daily for 7 days, then 3 tabs three times daily thereafter., Disp: 207 tablet, Rfl: 0   sildenafil (REVATIO) 20 MG tablet, Take 1 tablet (20 mg total) by mouth daily as needed., Disp: 30 tablet, Rfl: 0   simvastatin (ZOCOR) 40 MG tablet, TAKE 1 TABLET(40 MG) BY MOUTH EVERY NIGHT, Disp: 90 tablet, Rfl: 1   Turmeric (QC TUMERIC COMPLEX PO), Take by mouth., Disp: , Rfl:    Probiotic Product (PROBIOTIC-10 PO), Take by  mouth. (Patient not taking: Reported on 01/21/2023), Disp: , Rfl:       Objective:   Vitals:   02/01/23 0958  BP: (!) 150/70  Pulse: 75  Temp: 98 F (36.7 C)  SpO2: 95%  Weight: 209 lb 9.6 oz (95.1 kg)  Height: 5' 7.5" (1.715 m)    Estimated body mass index is 32.34 kg/m as calculated from the following:   Height as of this encounter: 5' 7.5" (1.715 m).   Weight as of this encounter: 209 lb 9.6 oz (95.1 kg).  '@WEIGHTCHANGE'$ @  [employer]   02/01/23 0958  Weight: 209 lb 9.6 oz (95.1 kg)     Physical Exam    General: No distress. Looks well Neuro: Alert and Oriented x 3. GCS 15. Speech normal Psych: Pleasant Resp:  Barrel Chest - no.  Wheeze - no, Crackles - yes, No overt respiratory distress CVS: Normal heart sounds. Murmurs - no Ext: Stigmata of Connective Tissue Disease - no HEENT: Normal upper airway. PEERL +. No post nasal drip        Assessment:       ICD-10-CM   1. IPF (idiopathic pulmonary fibrosis) ([place])  J84.112     2. 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 Encounter for therapeutic drug monitoring  Z51.81     4. Diarrhea due to drug  K52.1          Plan:     Patient Instructions     ICD-10-CM   1. IPF (idiopathic pulmonary fibrosis) ([place])  J84.112     2. UIP (usual interstitial pneumonitis) ([place])  J84.112     3. Encounter for therapeutic drug monitoring  Z51.81     4. Diarrhea due to drug  K52.1       Pulmonary fibrosis is been slowly progressive between 2020 and 2024.  There is at least a greater than 10% decline in pulmonary function test between 2023 and March 2024.  Glad you are on pirfenidone but did notice that you are having diarrhea at the 6 pills/day dosing  Plan - Do liver function test today - Stay on 6 pills [2 pills 3 times daily] for at least another 2 weeks and then increase to 9 pills [3 pills 3 times daily]  -This is because of the onset of diarrhea -Repeat liver function test another 4-6  weeks -Keep GI appointment for your diarrhea which is some chronic element to it -Pulmonary rehabilitation at [facility] in [place] -Consider clinical trials in the future  Follow-up - Return in 6 weeks to see nurse practitioner for continued therapeutic monitoring and progress -  SIGNATURE    Dr. Brand Males, M.D., F.C.C.P,  Pulmonary and Critical Care Medicine Staff Physician, [place] Director - Interstitial Lung Disease  Program  Pulmonary [place] [facility] [place], [place], [address]  Pager: [phone], If no answer or between  15:00h - 7:00h: call [phone] Telephone: [phone]  10:35 AM 02/01/2023

## 2023-02-02 ENCOUNTER — Encounter: Payer: Self-pay | Admitting: Internal Medicine

## 2023-02-02 NOTE — Telephone Encounter (Signed)
Received the following message from patient:  " In my recent lung function test, just how much did the albuterol help my lung function? It is time for a refill on my Albuterol and I am wondering if it is worth it.    Vincent Meyers "  Reviewed patient's last OV notes and it did not mention anything about continuing albuterol. MR, can you please advise?

## 2023-02-02 NOTE — Telephone Encounter (Signed)
There was no chagne with BD resppopnse. Given only fibrosois unless he feels it is helping him subjectvely, objectively we have no data to tell us that albuterol will help him

## 2023-02-10 ENCOUNTER — Other Ambulatory Visit (HOSPITAL_COMMUNITY): Payer: Self-pay

## 2023-02-13 ENCOUNTER — Other Ambulatory Visit (HOSPITAL_BASED_OUTPATIENT_CLINIC_OR_DEPARTMENT_OTHER): Payer: Self-pay | Admitting: Family Medicine

## 2023-02-13 DIAGNOSIS — E782 Mixed hyperlipidemia: Secondary | ICD-10-CM

## 2023-02-14 ENCOUNTER — Other Ambulatory Visit: Payer: Self-pay | Admitting: Internal Medicine

## 2023-02-14 ENCOUNTER — Other Ambulatory Visit (HOSPITAL_COMMUNITY): Payer: Self-pay

## 2023-02-14 DIAGNOSIS — J84112 Idiopathic pulmonary fibrosis: Secondary | ICD-10-CM

## 2023-02-15 ENCOUNTER — Other Ambulatory Visit (HOSPITAL_COMMUNITY): Payer: Self-pay

## 2023-02-16 ENCOUNTER — Other Ambulatory Visit (HOSPITAL_COMMUNITY): Payer: Self-pay

## 2023-02-16 ENCOUNTER — Other Ambulatory Visit: Payer: Self-pay | Admitting: Internal Medicine

## 2023-02-16 DIAGNOSIS — J84112 Idiopathic pulmonary fibrosis: Secondary | ICD-10-CM

## 2023-02-17 ENCOUNTER — Other Ambulatory Visit: Payer: Self-pay

## 2023-02-17 ENCOUNTER — Other Ambulatory Visit (HOSPITAL_COMMUNITY): Payer: Self-pay

## 2023-02-17 MED ORDER — PIRFENIDONE 267 MG PO TABS
ORAL_TABLET | ORAL | 3 refills | Status: DC
  Filled 2023-02-17: qty 270, 30d supply, fill #0
  Filled 2023-03-16: qty 270, 30d supply, fill #1
  Filled 2023-04-12: qty 270, 30d supply, fill #2

## 2023-03-01 ENCOUNTER — Encounter (HOSPITAL_COMMUNITY): Payer: Self-pay

## 2023-03-11 ENCOUNTER — Other Ambulatory Visit (HOSPITAL_COMMUNITY): Payer: Self-pay

## 2023-03-14 ENCOUNTER — Other Ambulatory Visit (HOSPITAL_COMMUNITY): Payer: Self-pay

## 2023-03-14 ENCOUNTER — Encounter: Payer: Self-pay | Admitting: Gastroenterology

## 2023-03-14 ENCOUNTER — Ambulatory Visit: Payer: Medicare HMO | Admitting: Gastroenterology

## 2023-03-14 ENCOUNTER — Other Ambulatory Visit: Payer: Medicare HMO

## 2023-03-14 VITALS — BP 140/78 | HR 73 | Ht 67.0 in | Wt 209.0 lb

## 2023-03-14 DIAGNOSIS — Z86010 Personal history of colonic polyps: Secondary | ICD-10-CM | POA: Diagnosis not present

## 2023-03-14 DIAGNOSIS — K529 Noninfective gastroenteritis and colitis, unspecified: Secondary | ICD-10-CM

## 2023-03-14 DIAGNOSIS — K219 Gastro-esophageal reflux disease without esophagitis: Secondary | ICD-10-CM

## 2023-03-14 MED ORDER — FAMOTIDINE 40 MG PO TABS: 40.0000 mg | ORAL_TABLET | Freq: Every day | ORAL | 1 refills | Status: DC

## 2023-03-14 NOTE — Progress Notes (Signed)
[facility] Consult Note:  History: Vincent Meyers 03/14/2023  Referring provider: [name], [name], MD  Reason for consult/chief complaint: Diarrhea (Pt state his diarrhea has clear up. He states he would like to discuss his colonoscopy results.)   Subjective  HPI: GI clinic consult note 12/23/2022 outlined patient's previous history of colon polyps with Dr. [name].  He also has about a decade of intermittent bloating gas and diarrhea. Colonoscopy with Dr. [name] 01/21/2023: Complete exam with good prep, 15 mm ascending colon TVA removed with EMR.  1 year surveillance recall recommended.  Random colon biopsies negative for microscopic colitis.  Today, he reports experiencing on and off diarrhea for years now. He states that he had an episode shortly prior to taking a trip out of town 3 weeks ago. 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 He also complains of gas causing him to feel pressure in his upper track.We also recalled his recent colonoscopy report and discussed the removed polyps. He reports some concerns about long term use of Omeprazole 20 mg. He states he's been taking it for the past 10 years or so for reflux symptoms and states that he usually takes it every other days but can't go without for more than 2-3 day as his reflux symptoms would reoccur.   He reports good appetite and denies any weight changes.   He denies any pancreatitis and states that his alcohol usage was heavy at times but for years has been very mild usage.   Reports no diarrhea since seeing me for the colonoscopy. ROS:  Review of Systems  Constitutional:  Negative for appetite change and fever.  HENT:  Negative for trouble swallowing.   Respiratory:  Negative for cough and shortness of breath.   Cardiovascular:  Negative for chest pain.  Gastrointestinal:  Positive for diarrhea. Negative for abdominal  distention, abdominal pain, anal bleeding, blood in stool, constipation, nausea, rectal pain and vomiting.  Genitourinary:  Negative for dysuria.  Musculoskeletal:  Negative for back pain.  Skin:  Negative for rash.  Neurological:  Negative for weakness.  All other systems reviewed and are negative.    Past Medical History: Past Medical History:  Diagnosis Date   Angina pectoris    Anxiety    Arthritis    Diabetes mellitus without complication    Erectile dysfunction    Hyperlipidemia    Hypertension    IBS (irritable bowel syndrome)    ILD (interstitial lung disease)    OSA (obstructive sleep apnea)      Past Surgical History: Past Surgical History:  Procedure Laterality Date   APPENDECTOMY     COLON SURGERY     perforated bowel and hernia repair   ILIAC ARTERY ANEURYSM REPAIR Left 2006   Dr. Arbie Cookey (redo surgery)   REPLACEMENT TOTAL KNEE BILATERAL     TONSILLECTOMY     age 77     Family History: Family History  Problem Relation Age of Onset   Parkinson's disease Mother    Heart attack Father    Stroke Father    Alzheimer's disease Father    Alzheimer's disease Sister    Other Sister        degenerative muscle disease   Hypertension Daughter    Obesity Daughter    Heart murmur Daughter    Colon cancer Neg Hx    Esophageal cancer Neg Hx  Rectal cancer Neg Hx    Stomach cancer Neg Hx     Social History: Social History   Socioeconomic History   Marital status: Widowed    Spouse name: Not on file   Number of children: 2   Years of education: Not on file   Highest education level: Not on file  Occupational History   Occupation: retired  Tobacco Use   Smoking status: Former    Packs/day: 2.00    Years: 23.00    Additional pack years: 0.00    Total pack years: 46.00    Types: Cigarettes    Start date: 1966    Quit date: 1989    Years since quitting: 35.3   Smokeless tobacco: Never  Vaping Use   Vaping Use: Never used  Substance and Sexual  Activity   Alcohol use: Not Currently    Alcohol/week: 2.0 standard drinks of alcohol    Types: 2 Standard drinks or equivalent per week    Comment: quit 7 months ago stated 12/23/22   Drug use: Never   Sexual activity: Yes  Other Topics Concern   Not on file  Social History Narrative   Not on file   Social Determinants of Health   Financial Resource Strain: Low Risk  (01/04/2023)   Overall Financial Resource Strain (CARDIA)    Difficulty of Paying Living Expenses: Not hard at all  Food Insecurity: No Food Insecurity (01/04/2023)   Hunger Vital Sign    Worried About Running Out of Food in the Last Year: Never true    Ran Out of Food in the Last Year: Never true  Transportation Needs: No Transportation Needs (01/04/2023)   PRAPARE - Administrator, Civil Service (Medical): No    Lack of Transportation (Non-Medical): No  Physical Activity: Inactive (01/04/2023)   Exercise Vital Sign    Days of Exercise per Week: 0 days    Minutes of Exercise per Session: 0 min  Stress: No Stress Concern Present (01/04/2023)   [employer] of Occupational Health - Occupational Stress Questionnaire    Feeling of Stress : Not at all  Social Connections: Socially Integrated (01/04/2023)   Social Connection and Isolation Panel [NHANES]    Frequency of Communication with Friends and Family: More than three times a week    Frequency of Social Gatherings with Friends and Family: More than three times a week    Attends Religious Services: More than 4 times per year    Active Member of [employer] or Organizations: Yes    Attends Engineer, structural: More than 4 times per year    Marital Status: Married    Allergies: No Known Allergies  Outpatient Meds: Current Outpatient Medications  Medication Sig Dispense Refill   albuterol (VENTOLIN HFA) 108 (90 Base) MCG/ACT inhaler Inhale 1-2 puffs into the lungs every 6 (six) hours as needed. 8 g 2   amLODipine (NORVASC) 5 MG tablet Take 1  tablet (5 mg total) by mouth daily. 90 tablet 1   aspirin EC 81 MG tablet Take by mouth.     atenolol (TENORMIN) 50 MG tablet TAKE 1 AND 1/2 TABLETS BY MOUTH DAILY 135 tablet 1   Flaxseed, Linseed, (FLAXSEED OIL PO) Take by mouth.     fluticasone (FLONASE) 50 MCG/ACT nasal spray Place 2 sprays into both nostrils daily.     hydrochlorothiazide (HYDRODIURIL) 25 MG tablet TAKE 1 TABLET BY MOUTH EVERY DAY 90 tablet 0   lisinopril (ZESTRIL) 40 MG tablet  TAKE 1 TABLET BY MOUTH EVERY DAY 90 tablet 1   Magnesium Gluconate 550 MG TABS Take by mouth.     omeprazole (PRILOSEC) 20 MG capsule TAKE 1 CAPSULE BY MOUTH EVERY DAY 90 capsule 1   Pirfenidone 267 MG TABS Increase to 9 pills [3 pills 3 times daily]. 270 tablet 3   Probiotic Product (PROBIOTIC-10 PO) Take by mouth.     sildenafil (REVATIO) 20 MG tablet Take 1 tablet (20 mg total) by mouth daily as needed. 30 tablet 0   simvastatin (ZOCOR) 40 MG tablet TAKE 1 TABLET(40 MG) BY MOUTH EVERY NIGHT 90 tablet 1   Turmeric (QC TUMERIC COMPLEX PO) Take by mouth.     No current facility-administered medications for this visit.      ___________________________________________________________________ Objective   Exam:  Ht  (1.702 m)   Wt 209 lb (94.8 kg)   BMI 32.73 kg/m  Wt Readings from Last 3 Encounters:  03/14/23 209 lb (94.8 kg)  02/01/23 209 lb 9.6 oz (95.1 kg)  01/21/23 210 lb (95.3 kg)    General: well appearing  Eyes: sclera anicteric, no redness ENT: oral mucosa moist without lesions, no cervical or supraclavicular lymphadenopathy CV: RRR, no JVD, no peripheral edema. Crackles in bilateral lower field.  Resp: clear to auscultation bilaterally, normal RR and effort noted GI: soft, no tenderness, with active bowel sounds. No guarding or palpable organomegaly noted. Skin; warm and dry, no rash or jaundice noted Neuro: awake, alert and oriented x 3. Normal gross motor function and fluent speech  Labs:  Colonoscopy pathology on  file and as noted above   Assessment: Chronic diarrhea  Personal history of colonic polyps  Gastroesophageal reflux disease, unspecified whether esophagitis present   Intermittent diarrhea for many years, does sound IBS-like perhaps with some undefined maldigestion triggers.  No risk factors for EPI or SIBO, and the intermittent nature of it does not favor those diagnoses.  He can use Imodium as needed for diarrhea, as I would prefer not to prescribe antispasmodic therapy in this age group if possible because of the chance of anticholinergic side effects.  He has longstanding reflux symptoms for which he was prescribed PPI many years ago and now takes it once every other day.  We should try de-escalation therapy and see how he tolerates that.  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  He was encouraged to call us or send a portal message in a few weeks with an update on his reflux symptoms so we can decide what to do with acid suppression therapy.  Thank you for the courtesy of this consult.  Please call me with any questions or concerns.   Amada Jupiter, MD    [facility]   31 minutes were spent on this encounter (including chart review, history/exam, counseling/coordination of care, and documentation) > 50% of that time was spent on counseling and coordination of care.  I,Safa M Kadhim,acting as a scribe for Charlie Pitter III, MD.,have documented all relevant documentation on the behalf of Sherrilyn Rist, MD,as directed by  Sherrilyn Rist, MD while in the presence of Sherrilyn Rist, MD.   Marvis Repress III, MD, have reviewed all documentation for this visit. The documentation  on 03/14/23 for the exam, diagnosis, procedures, and orders are all  accurate and complete.   CC: Referring provider noted above

## 2023-03-14 NOTE — Patient Instructions (Addendum)
_______________________________________________________  If your blood pressure at your visit was 140/90 or greater, please contact your primary care physician to follow up on this.  _______________________________________________________  If you are age 76 or older, your body mass index should be between 23-30. Your Body mass index is 32.73 kg/m. If this is out of the aforementioned range listed, please consider follow up with your Primary Care Provider.  If you are age 31 or younger, your body mass index should be between 19-25. Your Body mass index is 32.73 kg/m. If this is out of the aformentioned range listed, please consider follow up with your Primary Care Provider.   ________________________________________________________  The [facility] providers would like to encourage you to use [facility] to communicate with providers for non-urgent requests or questions.  Due to long hold times on the telephone, sending your provider a message by [facility] may be a faster and more efficient way to get a response.  Please allow 48 business hours for a response.  Please remember that this is for non-urgent requests.  _______________________________________________________  Your provider has requested that you go to the basement level for lab work before leaving today. Press "B" on the elevator. The lab is located at the first door on the left as you exit the elevator.  Due to recent changes in healthcare laws, you may see the results of your imaging and laboratory studies on MyChart before your provider has had a chance to review them.  We understand that in some cases there may be results that are confusing or concerning to you. Not all laboratory results come back in the same time frame and the provider may be waiting for multiple results in order to interpret others.  Please give [place] 48 hours in order for your provider to thoroughly review all the results before contacting the office for clarification of  your results.   Change the every other day omeprazole to famotidine every other day.  It was a pleasure to see you today!  Thank you for trusting me with your gastrointestinal care!

## 2023-03-14 NOTE — Progress Notes (Deleted)
**Note Vincent-Identified via Obfuscation** [facility] Consult Note:  History: Vincent Meyers 03/14/2023  Referring provider: [name], [name], MD  Reason for consult/chief complaint: Diarrhea (Pt state his diarrhea has clear up. He states he would like to discuss his colonoscopy results.)   Subjective  HPI: GI clinic consult note 12/23/2022 outlined patient's previous history of colon polyps with Dr. Jennye Meyers.  He also has about a decade of intermittent bloating gas and diarrhea. Colonoscopy with Dr. Myrtie Meyers 01/21/2023: Complete exam with good prep, 15 mm ascending colon TVA removed with EMR.  1 year surveillance recall recommended.  Random colon biopsies negative for microscopic colitis. ***   ROS:  Review of Systems   Past Medical History: Past Medical History:  Diagnosis Date   Angina pectoris    Anxiety    Arthritis    Diabetes mellitus without complication    Erectile dysfunction    Hyperlipidemia    Hypertension    IBS (irritable bowel syndrome)    ILD (interstitial lung disease)    OSA (obstructive sleep apnea)      Past Surgical History: Past Surgical History:  Procedure Laterality Date   APPENDECTOMY     COLON SURGERY     perforated bowel and hernia repair   ILIAC ARTERY ANEURYSM REPAIR Left 2006   Dr. Arbie Cookey (redo surgery)   REPLACEMENT TOTAL KNEE BILATERAL     TONSILLECTOMY     age 9     Family History: Family History  Problem Relation Age of Onset   Parkinson's disease Mother    Heart attack Father    Stroke Father    Alzheimer's disease Father    Alzheimer's disease Sister    Other Sister        degenerative muscle disease   Hypertension Daughter    Obesity Daughter    Heart murmur Daughter    Colon cancer Neg Hx    Esophageal cancer Neg Hx    Rectal cancer Neg Hx    Stomach cancer Neg Hx     Social History: Social History   Socioeconomic History   Marital status: Widowed    Spouse name: Not on file   Number of children: 2   Years of education:  Not on file   Highest education level: Not on file  Occupational History   Occupation: retired  Tobacco Use   Smoking status: Former    Packs/day: 2.00    Years: 23.00    Additional pack years: 0.00    Total pack years: 46.00    Types: Cigarettes    Start date: 1966    Quit date: 1989    Years since quitting: 35.3   Smokeless tobacco: Never  Vaping Use   Vaping Use: Never used  Substance and Sexual Activity   Alcohol use: Not Currently    Alcohol/week: 2.0 standard drinks of alcohol    Types: 2 Standard drinks or equivalent per week    Comment: quit 7 months ago stated 12/23/22   Drug use: Never   Sexual activity: Yes  Other Topics Concern   Not on file  Social History Narrative   Not on file   Social Determinants of Health   Financial Resource Strain: Low Risk  (01/04/2023)   Overall Financial Resource Strain (CARDIA)    Difficulty of Paying Living Expenses: Not hard at all  Food Insecurity: No Food Insecurity (01/04/2023)   Hunger Vital Sign    Worried About Running Out of Food in the Last Year: Never true  Ran Out of Food in the Last Year: Never true  Transportation Needs: No Transportation Needs (01/04/2023)   PRAPARE - Administrator, Civil Service (Medical): No    Lack of Transportation (Non-Medical): No  Physical Activity: Inactive (01/04/2023)   Exercise Vital Sign    Days of Exercise per Week: 0 days    Minutes of Exercise per Session: 0 min  Stress: No Stress Concern Present (01/04/2023)   [employer] of Occupational Health - Occupational Stress Questionnaire    Feeling of Stress : Not at all  Social Connections: Socially Integrated (01/04/2023)   Social Connection and Isolation Panel [NHANES]    Frequency of Communication with Friends and Family: More than three times a week    Frequency of Social Gatherings with Friends and Family: More than three times a week    Attends Religious Services: More than 4 times per year    Active Member of  [employer] or Organizations: Yes    Attends Engineer, structural: More than 4 times per year    Marital Status: Married    Allergies: No Known Allergies  Outpatient Meds: Current Outpatient Medications  Medication Sig Dispense Refill   albuterol (VENTOLIN HFA) 108 (90 Base) MCG/ACT inhaler Inhale 1-2 puffs into the lungs every 6 (six) hours as needed. 8 g 2   amLODipine (NORVASC) 5 MG tablet Take 1 tablet (5 mg total) by mouth daily. 90 tablet 1   aspirin EC 81 MG tablet Take by mouth.     atenolol (TENORMIN) 50 MG tablet TAKE 1 AND 1/2 TABLETS BY MOUTH DAILY 135 tablet 1   Flaxseed, Linseed, (FLAXSEED OIL PO) Take by mouth.     fluticasone (FLONASE) 50 MCG/ACT nasal spray Place 2 sprays into both nostrils daily.     hydrochlorothiazide (HYDRODIURIL) 25 MG tablet TAKE 1 TABLET BY MOUTH EVERY DAY 90 tablet 0   lisinopril (ZESTRIL) 40 MG tablet TAKE 1 TABLET BY MOUTH EVERY DAY 90 tablet 1   Magnesium Gluconate 550 MG TABS Take by mouth.     omeprazole (PRILOSEC) 20 MG capsule TAKE 1 CAPSULE BY MOUTH EVERY DAY 90 capsule 1   Pirfenidone 267 MG TABS Increase to 9 pills [3 pills 3 times daily]. 270 tablet 3   Probiotic Product (PROBIOTIC-10 PO) Take by mouth.     sildenafil (REVATIO) 20 MG tablet Take 1 tablet (20 mg total) by mouth daily as needed. 30 tablet 0   simvastatin (ZOCOR) 40 MG tablet TAKE 1 TABLET(40 MG) BY MOUTH EVERY NIGHT 90 tablet 1   Turmeric (QC TUMERIC COMPLEX PO) Take by mouth.     No current facility-administered medications for this visit.      ___________________________________________________________________ Objective   Exam:  BP (!) 140/78   Pulse 73   Ht  (1.702 m)   Wt 209 lb (94.8 kg)   BMI 32.73 kg/m  Wt Readings from Last 3 Encounters:  03/14/23 209 lb (94.8 kg)  02/01/23 209 lb 9.6 oz (95.1 kg)  01/21/23 210 lb (95.3 kg)    General: ***  Eyes: sclera anicteric, no redness ENT: oral mucosa moist without lesions, no cervical or  supraclavicular lymphadenopathy CV: ***, no JVD, no peripheral edema Resp: clear to auscultation bilaterally, normal RR and effort noted GI: soft, *** tenderness, with active bowel sounds. No guarding or palpable organomegaly noted. Skin; warm and dry, no rash or jaundice noted Neuro: awake, alert and oriented x 3. Normal gross motor function and fluent speech  Labs:  Colonoscopy pathology on file and as noted above   Assessment: Encounter Diagnoses  Name Primary?   Chronic diarrhea Yes   Personal history of colonic polyps    Gastroesophageal reflux disease, unspecified whether esophagitis present     ***  Plan:  ***  Thank you for the courtesy of this consult.  Please call me with any questions or concerns.  Charlie Pitter III  CC: Referring provider noted above

## 2023-03-15 ENCOUNTER — Encounter: Payer: Self-pay | Admitting: Adult Health

## 2023-03-15 ENCOUNTER — Ambulatory Visit: Payer: Medicare HMO | Admitting: Adult Health

## 2023-03-15 VITALS — BP 160/68 | HR 76 | Temp 97.7°F | Ht 67.5 in | Wt 210.0 lb

## 2023-03-15 DIAGNOSIS — J849 Interstitial pulmonary disease, unspecified: Secondary | ICD-10-CM

## 2023-03-15 DIAGNOSIS — G4733 Obstructive sleep apnea (adult) (pediatric): Secondary | ICD-10-CM

## 2023-03-15 DIAGNOSIS — J449 Chronic obstructive pulmonary disease, unspecified: Secondary | ICD-10-CM | POA: Diagnosis not present

## 2023-03-15 DIAGNOSIS — Z5181 Encounter for therapeutic drug level monitoring: Secondary | ICD-10-CM

## 2023-03-15 LAB — HEPATIC FUNCTION PANEL
ALT: 17 U/L (ref 0–53)
AST: 20 U/L (ref 0–37)
Albumin: 4.3 g/dL (ref 3.5–5.2)
Alkaline Phosphatase: 57 U/L (ref 39–117)
Bilirubin, Direct: 0.1 mg/dL (ref 0.0–0.3)
Total Bilirubin: 0.4 mg/dL (ref 0.2–1.2)
Total Protein: 7 g/dL (ref 6.0–8.3)

## 2023-03-15 LAB — IGA: Immunoglobulin A: 368 mg/dL — ABNORMAL HIGH (ref 70–320)

## 2023-03-15 LAB — TISSUE TRANSGLUTAMINASE, IGA: (tTG) Ab, IgA: <1 U/mL

## 2023-03-15 NOTE — Assessment & Plan Note (Signed)
Appears stable.  Albuterol as needed.

## 2023-03-15 NOTE — Assessment & Plan Note (Signed)
Moderate obstructive sleep apnea with excellent control and compliance on nocturnal CPAP

## 2023-03-15 NOTE — Progress Notes (Signed)
  ID: Vincent Meyers, male    DOB: 15-Mar-1946, 77 y.o.   MRN: [id number]  Chief Complaint  Patient presents with   Follow-up    Referring provider: [name], [name], MD  HPI: 77 year old male former smoker followed for pulmonary fibrosis (UIP) and OSA  Former Psychologist, occupational   TEST/EVENTS :  PFT 11/17/20 >> FEV1 2.73 (94%), FEV1% 83, TLC 4.36 (65%), DLCO 67% Serology 06/02/21 >> negative PFTs February 01, 2023 FEV1 74%, ratio 72, FVC 74%.,  No significant bronchodilator response, DLCO 62%.   Chest Imaging:  Cardiac CT 07/25/19 >> mild fibrotic changes at bases Rt > Lt CT chest 08/24/20 >> UIP pattern High-resolution CT chest January 25, 2023 stable fibrotic changes, consistent with UIP.   Sleep Tests:  HST 10/25/20 >> AHI 23.4, SpO2 low 78% Auto CPAP 08/26/21 to 09/24/21 >> used on 27 of 30 nights with average 8 hrs 46 min.  Average AHI 5.5 with median CPAP 7 and 95 th percentile CPAP 11 cm H2O  03/15/2023 Follow up : IPF and OSA  Patient returns for a 6-week follow-up.  Patient was seen last visit for IPF.  Patient is maintained on Esbriet.  Last visit patient was recommended to titrate up to full dosing slowly.  He had been having some stomach issues with diarrhea.  He also was referred to GI.  Patient was seen by GI and underwent a colonoscopy, biopsies were negative for microscopic colitis.  Positive adenoma.  Plan for a repeat colonoscopy in 1 year.  And change from Prilosec to Pepcid.  Felt to have a possible component of IBS.  Since last visit patient has reached full dose of Esbriet.  He says that he has had some intermittent diarrhea but over the last week or so it has gotten substantially better.  Patient says he still remains active.  He works on cars and motorcycles.  Is able to do some yard work.  He does have to rest frequently.  Patient is not on any oxygen.  Walk test today in the office which showed no significant desaturations with O2 saturations remained above 90% with  ambulation.  Patient was referred to pulmonary rehab.  He was contacted but has not received start date. Went on trip to [place] to see [place]. Did really well, had a great trip .   Patient has underlying sleep apnea is on nocturnal CPAP says he wears his CPAP every night cannot sleep without it.  CPAP download shows excellent compliance with 100% usage.  Daily average usage at 9 hours.  Patient is on auto CPAP 5 to 8 cm H2O.  AHI 3.4/hour.  No Known Allergies  Immunization History  Administered Date(s) Administered   Fluad Quad(high Dose 65+) 08/23/2022   Influenza Split 09/19/2015   Influenza, High Dose Seasonal PF 10/24/2019   Influenza, Quadrivalent, Recombinant, Inj, Pf 08/22/2017   Influenza,inj,Quad PF,6+ Mos 10/11/2016, 10/24/2018   Influenza,inj,Quad PF,6-35 Mos 10/11/2016, 10/24/2018   Influenza,inj,quad, With Preservative 09/19/2015   Influenza,trivalent, recombinat, inj, PF 08/22/2017   PFIZER(Purple Top)SARS-COV-2 Vaccination 01/06/2020, 01/29/2020   Pneumococcal Conjugate-13 05/19/2016   Pneumococcal Polysaccharide-23 09/05/2012   Tdap 03/12/2015   Unspecified SARS-COV-2 Vaccination 08/16/2022   Zoster, Live 11/05/2013    Past Medical History:  Diagnosis Date   Angina pectoris    Anxiety    Arthritis    Diabetes mellitus without complication    Erectile dysfunction    Hyperlipidemia    Hypertension    IBS (irritable bowel syndrome)  ILD (interstitial lung disease)    OSA (obstructive sleep apnea)     Tobacco History: Social History   Tobacco Use  Smoking Status Former   Packs/day: 2.00   Years: 23.00   Additional pack years: 0.00   Total pack years: 46.00   Types: Cigarettes   Start date: 1966   Quit date: 1989   Years since quitting: 35.3  Smokeless Tobacco Never   Counseling given: Not Answered   Outpatient Medications Prior to Visit  Medication Sig Dispense Refill   albuterol (VENTOLIN HFA) 108 (90 Base) MCG/ACT inhaler Inhale 1-2  puffs into the lungs every 6 (six) hours as needed. 8 g 2   amLODipine (NORVASC) 5 MG tablet Take 1 tablet (5 mg total) by mouth daily. 90 tablet 1   aspirin EC 81 MG tablet Take by mouth.     atenolol (TENORMIN) 50 MG tablet TAKE 1 AND 1/2 TABLETS BY MOUTH DAILY 135 tablet 1   famotidine (PEPCID) 40 MG tablet Take 1 tablet (40 mg total) by mouth daily. 30 tablet 1   Flaxseed, Linseed, (FLAXSEED OIL PO) Take by mouth.     fluticasone (FLONASE) 50 MCG/ACT nasal spray Place 2 sprays into both nostrils daily.     hydrochlorothiazide (HYDRODIURIL) 25 MG tablet TAKE 1 TABLET BY MOUTH EVERY DAY 90 tablet 0   lisinopril (ZESTRIL) 40 MG tablet TAKE 1 TABLET BY MOUTH EVERY DAY 90 tablet 1   Magnesium Gluconate 550 MG TABS Take by mouth.     omeprazole (PRILOSEC) 20 MG capsule TAKE 1 CAPSULE BY MOUTH EVERY DAY 90 capsule 1   Pirfenidone 267 MG TABS Increase to 9 pills [3 pills 3 times daily]. 270 tablet 3   Probiotic Product (PROBIOTIC-10 PO) Take by mouth.     sildenafil (REVATIO) 20 MG tablet Take 1 tablet (20 mg total) by mouth daily as needed. 30 tablet 0   simvastatin (ZOCOR) 40 MG tablet TAKE 1 TABLET(40 MG) BY MOUTH EVERY NIGHT 90 tablet 1   Turmeric (QC TUMERIC COMPLEX PO) Take by mouth.     No facility-administered medications prior to visit.     Review of Systems:   Constitutional:   No  weight loss, night sweats,  Fevers, chills,  +fatigue, or  lassitude.  HEENT:   No headaches,  Difficulty swallowing,  Tooth/dental problems, or  Sore throat,                No sneezing, itching, ear ache, nasal congestion, post nasal drip,   CV:  No chest pain,  Orthopnea, PND, swelling in lower extremities, anasarca, dizziness, palpitations, syncope.   GI  No heartburn, indigestion, abdominal pain, nausea, vomiting, diarrhea, change in bowel habits, loss of appetite, bloody stools.   Resp:   No excess mucus, no productive cough,  No non-productive cough,  No coughing up of blood.  No change in  color of mucus.  No wheezing.  No chest wall deformity  Skin: no rash or lesions.  GU: no dysuria, change in color of urine, no urgency or frequency.  No flank pain, no hematuria   MS:  No joint pain or swelling.  No decreased range of motion.  No back pain.    Physical Exam  BP (!) 160/68 (BP Location: Left Arm, Patient Position: Sitting, Cuff Size: Normal)   Pulse 76   Temp 97.7 F (36.5 C) (Oral)   Ht 5' 7.5" (1.715 m)   Wt 210 lb (95.3 kg)   SpO2 94%  BMI 32.41 kg/m   GEN: A/Ox3; pleasant , NAD, well nourished    HEENT:  /AT,   NOSE-clear, THROAT-clear, no lesions, no postnasal drip or exudate noted.   NECK:  Supple w/ fair ROM; no JVD; normal carotid impulses w/o bruits; no thyromegaly or nodules palpated; no lymphadenopathy.    RESP  Clear  P & A; w/o, wheezes/ rales/ or rhonchi. no accessory muscle use, no dullness to percussion  CARD:  RRR, no m/r/g, no peripheral edema, pulses intact, no cyanosis or clubbing.  GI:   Soft & nt; nml bowel sounds; no organomegaly or masses detected.   Musco: Warm bil, no deformities or joint swelling noted.   Neuro: alert, no focal deficits noted.    Skin: Warm, no lesions or rashes    Lab Results:  CBC    Component Value Date/Time   WBC 11.1 (H) 11/18/2022 1104   RBC 5.38 11/18/2022 1104   HGB 14.6 11/18/2022 1104   HCT 42.9 11/18/2022 1104   PLT 228 11/18/2022 1104   MCV 80 11/18/2022 1104   MCH 27.1 11/18/2022 1104   MCHC 34.0 11/18/2022 1104   RDW 12.3 11/18/2022 1104   LYMPHSABS 1.6 11/18/2022 1104   EOSABS 0.4 11/18/2022 1104   BASOSABS 0.1 11/18/2022 1104    BMET    Component Value Date/Time   NA 132 (L) 11/18/2022 1104   K 4.1 11/18/2022 1104   CL 95 (L) 11/18/2022 1104   CO2 25 11/18/2022 1104   GLUCOSE 180 (H) 11/18/2022 1104   BUN 11 11/18/2022 1104   CREATININE 0.59 (L) 11/18/2022 1104   CALCIUM 8.9 11/18/2022 1104   GFRNONAA 100 07/16/2019 1120   GFRAA 115 07/16/2019 1120    BNP No  results found for: "BNP"  ProBNP No results found for: "PROBNP"  Imaging: No results found.       Latest Ref Rng & Units 02/01/2023    8:57 AM 11/26/2021    2:58 PM 11/17/2020    2:54 PM  PFT Results  FVC-Pre L 2.84  3.27  P 3.28   FVC-Predicted Pre % 74  83  P 82   FVC-Post L 2.87  3.31  P 3.16   FVC-Predicted Post % 75  83  P 79   Pre FEV1/FVC % % 72  68  P 83   Post FEV1/FCV % % 69  87  P 85   FEV1-Pre L 2.04  2.22  P 2.73   FEV1-Predicted Pre % 74  77  P 94   FEV1-Post L 1.97  2.89  P 2.69   DLCO uncorrected ml/min/mmHg 14.61  15.89  P 16.02   DLCO UNC% % 62  66  P 67   DLCO corrected ml/min/mmHg 14.61  15.89  P 16.02   DLCO COR %Predicted % 62  66  P 67   DLVA Predicted % 89  94  P 88   TLC L 4.50  4.78  P 4.36   TLC % Predicted % 68  72  P 65   RV % Predicted % 64  62  P 47     P Preliminary result    No results found for: "NITRICOXIDE"      Assessment & Plan:   ILD (interstitial lung disease) (HCC) IPF-UIP pattern on CT chest.  High-resolution CT chest January 25, 2023 showed stable fibrotic changes.  PFT in March showed a slight decrease in diffusing capacity.  Patient is on antifibrotic therapy with Esbriet.  He  has been able to reach full dose and seems to be tolerating.  Check LFTs today.  Pulmonary Is pending  Plan  Patient Instructions  Continue on Esbriet 3 Pills Three times a day.  Labs today .  Activity as tolerated.  Pulmonary rehab when available.  Albuterol inhaler 1-2 puffs every 6hr as needed.  Continue on CPAP At bedtime   Saline nasal rinses and saline nasal gel As needed   Work on healthy weight weight  Follow up with Dr. Marchelle Gearing  in 2 months and As needed      COPD (chronic obstructive pulmonary disease) (HCC) Appears stable.  Albuterol as needed.  OSA (obstructive sleep apnea) Moderate obstructive sleep apnea with excellent control and compliance on nocturnal CPAP     Rubye Oaks, NP 03/15/2023

## 2023-03-15 NOTE — Patient Instructions (Addendum)
Continue on Esbriet 3 Pills Three times a day.  Labs today .  Activity as tolerated.  Pulmonary rehab when available.  Albuterol inhaler 1-2 puffs every 6hr as needed.  Continue on CPAP At bedtime   Saline nasal rinses and saline nasal gel As needed   Work on healthy weight weight  Follow up with Dr. Marchelle Gearing  in 2 months and As needed

## 2023-03-15 NOTE — Assessment & Plan Note (Signed)
IPF-UIP pattern on CT chest.  High-resolution CT chest January 25, 2023 showed stable fibrotic changes.  PFT in March showed a slight decrease in diffusing capacity.  Patient is on antifibrotic therapy with Esbriet.  He has been able to reach full dose and seems to be tolerating.  Check LFTs today.  Pulmonary Is pending  Plan  Patient Instructions  Continue on Esbriet 3 Pills Three times a day.  Labs today .  Activity as tolerated.  Pulmonary rehab when available.  Albuterol inhaler 1-2 puffs every 6hr as needed.  Continue on CPAP At bedtime   Saline nasal rinses and saline nasal gel As needed   Work on healthy weight weight  Follow up with Dr. Marchelle Gearing  in 2 months and As needed

## 2023-03-16 ENCOUNTER — Other Ambulatory Visit (HOSPITAL_COMMUNITY): Payer: Self-pay

## 2023-03-17 ENCOUNTER — Other Ambulatory Visit (HOSPITAL_COMMUNITY): Payer: Self-pay

## 2023-03-19 ENCOUNTER — Other Ambulatory Visit: Payer: Self-pay | Admitting: Family Medicine

## 2023-03-28 ENCOUNTER — Telehealth (HOSPITAL_COMMUNITY): Payer: Self-pay

## 2023-03-28 NOTE — Telephone Encounter (Signed)
No response from pt.  Closed referral  

## 2023-04-01 ENCOUNTER — Telehealth: Payer: Self-pay | Admitting: Internal Medicine

## 2023-04-01 ENCOUNTER — Other Ambulatory Visit (INDEPENDENT_AMBULATORY_CARE_PROVIDER_SITE_OTHER): Payer: Medicare HMO

## 2023-04-01 DIAGNOSIS — R059 Cough, unspecified: Secondary | ICD-10-CM

## 2023-04-01 LAB — CBC WITH DIFFERENTIAL/PLATELET
Basophils Absolute: 0.1 10*3/uL (ref 0.0–0.1)
Basophils Relative: 0.6 % (ref 0.0–3.0)
Eosinophils Absolute: 0.4 10*3/uL (ref 0.0–0.7)
Eosinophils Relative: 2.8 % (ref 0.0–5.0)
HCT: 44.3 % (ref 39.0–52.0)
Hemoglobin: 15.1 g/dL (ref 13.0–17.0)
Lymphocytes Relative: 11.4 % — ABNORMAL LOW (ref 12.0–46.0)
Lymphs Abs: 1.6 10*3/uL (ref 0.7–4.0)
MCHC: 34.1 g/dL (ref 30.0–36.0)
MCV: 79.7 fl (ref 78.0–100.0)
Monocytes Absolute: 1.1 10*3/uL — ABNORMAL HIGH (ref 0.1–1.0)
Monocytes Relative: 8.1 % (ref 3.0–12.0)
Neutro Abs: 10.8 10*3/uL — ABNORMAL HIGH (ref 1.4–7.7)
Neutrophils Relative %: 77.1 % — ABNORMAL HIGH (ref 43.0–77.0)
Platelets: 214 10*3/uL (ref 150.0–400.0)
RBC: 5.56 Mil/uL (ref 4.22–5.81)
RDW: 15 % (ref 11.5–15.5)
WBC: 13.9 10*3/uL — ABNORMAL HIGH (ref 4.0–10.5)

## 2023-04-01 MED ORDER — PREDNISONE 10 MG PO TABS: ORAL_TABLET | ORAL | 0 refills | Status: AC

## 2023-04-01 NOTE — Telephone Encounter (Signed)
  Hard to figure out why cough is worse x 1 week.  Maybe pollen? Is there fever >? What color is the sputum? Any wheezing?  LMK,.  Based on this might rprescribe 5d steroids  No Known Allergies  Latest Reference Range & Units 04/08/22 12:59 11/18/22 11:04  EOS (ABSOLUTE) 0.0 - 0.4 x10E3/uL 0.4 0.4

## 2023-04-01 NOTE — Telephone Encounter (Signed)
His blood eos were hight and so could be allergies.   Plan  0 if he has time 04/01/2023 - like him to get blood work for cbc with diff and RAST allergy panel  - needs to be done off steroids  = after blood work 04/01/2023 - dop Please take prednisone 40 mg x1 day, then 30 mg x1 day, then 20 mg x1 day, then 10 mg x1 day, and then 5 mg x1 day and stop - due to no fever and no colored sputum - no antibiotics  0- if he cannot do blood work we can do it another time

## 2023-04-01 NOTE — Telephone Encounter (Signed)
Pt. Sick with dad cough he wanted a acute visit but noone is avail.

## 2023-04-01 NOTE — Telephone Encounter (Signed)
Lab work was collected at 1347 today. Prednisone was called into verified pt pharmacy. Pt notified. Nothing further needed at this time.

## 2023-04-01 NOTE — Telephone Encounter (Signed)
Spoke with pt who states he will come in this afternoon to have blood drawn. Pt was informed that after the labs are drawn prednisone order will be placed. Pt stated understanding. Will leave encounter open to document on after labs are drawn.

## 2023-04-01 NOTE — Telephone Encounter (Signed)
Spoke with pt who states he has not had any fever. Pt also states that cough is mostly dry but when he does have sputum it is clear. Pt states he has not had any wheezing. Pt state he has not use albuterol inhaler but does have access to it. Pt confirms using flonase as directed and states he is currently taking pepcid daily but not prilosec per GI instructions. Pt states he feels like this may be the onset of allergies as he did have the same thing happen 2 years ago and had to take an ABT to get rid of it. Dr. Marchelle Gearing please advise.

## 2023-04-01 NOTE — Telephone Encounter (Signed)
Called patient.  Patient c/o deep cough x 1 week.  Chest hurting from increased coughing.  Patient unable to lay down on R/L side and had to sleep on stomach.  Patient did have some improvement last night.  Cough more productive this morning.  Sx seem to improve as long as patient keeps moving during daytime.  Please advise.

## 2023-04-04 LAB — RESPIRATORY ALLERGY PROFILE REGION II ~~LOC~~

## 2023-04-04 LAB — INTERPRETATION:

## 2023-04-07 ENCOUNTER — Other Ambulatory Visit: Payer: Self-pay | Admitting: Gastroenterology

## 2023-04-12 ENCOUNTER — Other Ambulatory Visit: Payer: Self-pay

## 2023-04-15 ENCOUNTER — Other Ambulatory Visit (HOSPITAL_COMMUNITY): Payer: Self-pay

## 2023-04-19 ENCOUNTER — Ambulatory Visit (HOSPITAL_BASED_OUTPATIENT_CLINIC_OR_DEPARTMENT_OTHER): Payer: Medicare HMO | Admitting: Family Medicine

## 2023-04-19 ENCOUNTER — Other Ambulatory Visit: Payer: Self-pay

## 2023-04-20 ENCOUNTER — Ambulatory Visit (HOSPITAL_BASED_OUTPATIENT_CLINIC_OR_DEPARTMENT_OTHER): Payer: Medicare HMO | Admitting: Family Medicine

## 2023-04-25 ENCOUNTER — Other Ambulatory Visit: Payer: Self-pay | Admitting: Cardiovascular Disease

## 2023-04-26 ENCOUNTER — Other Ambulatory Visit (HOSPITAL_BASED_OUTPATIENT_CLINIC_OR_DEPARTMENT_OTHER): Payer: Self-pay | Admitting: Family Medicine

## 2023-04-26 ENCOUNTER — Ambulatory Visit (INDEPENDENT_AMBULATORY_CARE_PROVIDER_SITE_OTHER): Payer: Medicare HMO | Admitting: Family Medicine

## 2023-04-26 ENCOUNTER — Encounter (HOSPITAL_BASED_OUTPATIENT_CLINIC_OR_DEPARTMENT_OTHER): Payer: Self-pay | Admitting: Family Medicine

## 2023-04-26 DIAGNOSIS — E1151 Type 2 diabetes mellitus with diabetic peripheral angiopathy without gangrene: Secondary | ICD-10-CM

## 2023-04-26 DIAGNOSIS — I10 Essential (primary) hypertension: Secondary | ICD-10-CM | POA: Diagnosis not present

## 2023-04-26 DIAGNOSIS — E1159 Type 2 diabetes mellitus with other circulatory complications: Secondary | ICD-10-CM

## 2023-04-26 NOTE — Progress Notes (Signed)
    Procedures performed today:    None.  Independent interpretation of notes and tests performed by another provider:   None.  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  Patient has primarily managed blood sugars without any medications in the past, primarily focusing on lifestyle modifications We will recheck hemoglobin A1c today for monitoring.  Did discuss considerations related to recent hemoglobin A1c elevation and if A1c continues to be elevated or further elevated.  We did discuss medication considerations such as metformin or possible GLP-1 receptor agonist.  Potentially be amenable to starting metformin to assist with controlling blood sugars.  We did discuss potential risks and side effects related to medication. He is due for nephropathy screening, will be completed today Foot exam completed in office today  Essential hypertension Blood pressure elevated in office, systolic above goal, diastolic at goal.  He checks blood pressure occasionally at home, has been borderline elevated when he does check his.  It was also elevated at recent visit to dentist.  He continues with antihypertensive medications as prescribed.  Last appointment with cardiology was a little over 6 months ago, is due for follow-up with cardiologist at this time.  Blood pressure did improve slightly on recheck in office today. For now, we can continue with current medication regimen, no changes today.  Recommend intermittent monitoring of blood pressure at home, DASH diet.  Recommend that patient arrange follow-up with cardiology, patient plans to contact his cardiologist office to schedule  Return in about 3 months (around  07/27/2023) for DM, HTN.  Spent 32 minutes on this patient encounter, including preparation, chart review, face-to-face counseling with patient and coordination of care, and documentation of encounter   ___________________________________________ Vincent Meyers [name], MD, ABFM, [facility] Primary Care and Sports Medicine [facility]

## 2023-04-26 NOTE — Assessment & Plan Note (Addendum)
Blood pressure elevated in office, systolic above goal, diastolic at goal.  He checks blood pressure occasionally at home, has been borderline elevated when he does check his.  It was also elevated at recent visit to dentist.  He continues with antihypertensive medications as prescribed.  Last appointment with cardiology was a little over 6 months ago, is due for follow-up with cardiologist at this time.  Blood pressure did improve slightly on recheck in office today. For now, we can continue with current medication regimen, no changes today.  Recommend intermittent monitoring of blood pressure at home, DASH diet.  Recommend that patient arrange follow-up with cardiology, patient plans to contact his cardiologist office to schedule

## 2023-04-26 NOTE — Patient Instructions (Signed)
  Medication Instructions:  Your physician recommends that you continue on your current medications as directed. Please refer to the Current Medication list given to you today. --If you need a refill on any your medications before your next appointment, please call your pharmacy first. If no refills are authorized on file call the office.-- Lab Work: Your physician has recommended that you have lab work today: Yes If you have labs (blood work) drawn today and your tests are completely normal, you will receive your results via MyChart message OR a phone call from our staff.  Please ensure you check your voicemail in the event that you authorized detailed messages to be left on a delegated number. If you have any lab test that is abnormal or we need to change your treatment, we will call you to review the results.  Referrals/Procedures/Imaging: No  Follow-Up: Your next appointment:   Your physician recommends that you schedule a follow-up appointment in: 3-4 months with Dr. de [place].  You will receive a text message or e-mail with a link to a survey about your care and experience with us today! We would greatly appreciate your feedback!   Thanks for letting us be apart of your health journey!!  Primary Care and Sports Medicine   Dr. Raymond de Cuba   We encourage you to activate your patient portal called "MyChart".  Sign up information is provided on this After Visit Summary.  MyChart is used to connect with patients for Virtual Visits (Telemedicine).  Patients are able to view lab/test results, encounter notes, upcoming appointments, etc.  Non-urgent messages can be sent to your provider as well. To learn more about what you can do with MyChart, please visit --  [email or web address].    

## 2023-04-26 NOTE — Assessment & Plan Note (Signed)
Recent hemoglobin A1c had increased from prior values, however was still appropriate given patient age and A1c target.  Patient has primarily managed blood sugars without any medications in the past, primarily focusing on lifestyle modifications We will recheck hemoglobin A1c today for monitoring.  Did discuss considerations related to recent hemoglobin A1c elevation and if A1c continues to be elevated or further elevated.  We did discuss medication considerations such as metformin or possible GLP-1 receptor agonist.  Potentially be amenable to starting metformin to assist with controlling blood sugars.  We did discuss potential risks and side effects related to medication. He is due for nephropathy screening, will be completed today Foot exam completed in office today

## 2023-04-27 LAB — MICROALBUMIN / CREATININE URINE RATIO
Creatinine, Urine: 31.4 mg/dL
Microalb/Creat Ratio: <10 mg/g creat (ref 0–29)
Microalbumin, Urine: <3 ug/mL

## 2023-04-27 LAB — HEMOGLOBIN A1C
Est. average glucose Bld gHb Est-mCnc: 151 mg/dL
Hgb A1c MFr Bld: 6.9 % — ABNORMAL HIGH (ref 4.8–5.6)

## 2023-04-28 NOTE — Addendum Note (Signed)
Addended by: [name], Marcy Salvo J on: 04/28/2023 11:11 AM   Modules accepted: Level of Service

## 2023-05-04 ENCOUNTER — Telehealth (HOSPITAL_BASED_OUTPATIENT_CLINIC_OR_DEPARTMENT_OTHER): Payer: Self-pay | Admitting: Family Medicine

## 2023-05-04 NOTE — Telephone Encounter (Signed)
lvm to call back to schedule diabetic retina eye screening for 05/12/23

## 2023-05-05 ENCOUNTER — Encounter (HOSPITAL_BASED_OUTPATIENT_CLINIC_OR_DEPARTMENT_OTHER): Payer: Self-pay | Admitting: Family Medicine

## 2023-05-05 ENCOUNTER — Other Ambulatory Visit (HOSPITAL_COMMUNITY): Payer: Self-pay

## 2023-05-09 ENCOUNTER — Other Ambulatory Visit: Payer: Self-pay

## 2023-05-11 ENCOUNTER — Other Ambulatory Visit (HOSPITAL_COMMUNITY): Payer: Self-pay

## 2023-05-13 ENCOUNTER — Other Ambulatory Visit (HOSPITAL_BASED_OUTPATIENT_CLINIC_OR_DEPARTMENT_OTHER): Payer: Self-pay | Admitting: Family Medicine

## 2023-05-13 DIAGNOSIS — I10 Essential (primary) hypertension: Secondary | ICD-10-CM

## 2023-05-17 ENCOUNTER — Other Ambulatory Visit (HOSPITAL_COMMUNITY): Payer: Self-pay

## 2023-05-18 ENCOUNTER — Encounter: Payer: Self-pay | Admitting: Internal Medicine

## 2023-05-18 ENCOUNTER — Other Ambulatory Visit: Payer: Self-pay

## 2023-05-18 ENCOUNTER — Other Ambulatory Visit (HOSPITAL_COMMUNITY): Payer: Self-pay

## 2023-05-18 ENCOUNTER — Telehealth: Payer: Self-pay | Admitting: Internal Medicine

## 2023-05-18 ENCOUNTER — Ambulatory Visit: Payer: Medicare HMO | Admitting: Internal Medicine

## 2023-05-18 VITALS — BP 130/70 | HR 73 | Ht 67.5 in | Wt 212.2 lb

## 2023-05-18 DIAGNOSIS — J84112 Idiopathic pulmonary fibrosis: Secondary | ICD-10-CM

## 2023-05-18 DIAGNOSIS — R0902 Hypoxemia: Secondary | ICD-10-CM | POA: Diagnosis not present

## 2023-05-18 DIAGNOSIS — Z5181 Encounter for therapeutic drug level monitoring: Secondary | ICD-10-CM

## 2023-05-18 LAB — HEPATIC FUNCTION PANEL
ALT: 22 U/L (ref 0–53)
AST: 25 U/L (ref 0–37)
Albumin: 4.2 g/dL (ref 3.5–5.2)
Alkaline Phosphatase: 59 U/L (ref 39–117)
Bilirubin, Direct: 0.2 mg/dL (ref 0.0–0.3)
Total Bilirubin: 0.6 mg/dL (ref 0.2–1.2)
Total Protein: 7.1 g/dL (ref 6.0–8.3)

## 2023-05-18 MED ORDER — PIRFENIDONE 801 MG PO TABS
801.0000 mg | ORAL_TABLET | Freq: Three times a day (TID) | ORAL | 5 refills | Status: DC
Start: 2023-05-18 — End: 2024-06-08
  Filled 2023-05-18: qty 270, 90d supply, fill #0
  Filled 2023-05-18: qty 90, 30d supply, fill #0
  Filled 2023-06-15: qty 90, 30d supply, fill #1
  Filled 2023-07-13: qty 90, 30d supply, fill #2
  Filled 2023-08-04: qty 90, 30d supply, fill #3
  Filled 2023-09-14: qty 90, 30d supply, fill #4
  Filled 2023-10-13: qty 90, 30d supply, fill #5
  Filled 2023-11-09: qty 90, 30d supply, fill #6
  Filled 2023-12-02: qty 90, 30d supply, fill #7
  Filled 2024-01-02: qty 90, 30d supply, fill #8
  Filled 2024-02-02: qty 90, 30d supply, fill #9
  Filled 2024-03-06: qty 90, 30d supply, fill #10
  Filled 2024-04-04: qty 90, 30d supply, fill #11
  Filled 2024-05-11 (×2): qty 90, 30d supply, fill #12

## 2023-05-18 NOTE — Telephone Encounter (Signed)
Rx for pirfenidone 801mg -tablets - 1 tab three times daily sent to [facility]. LFTs on 05/18/23 wnl  Chesley Mires, PharmD, MPH, BCPS, CPP Clinical Pharmacist (Rheumatology and Pulmonology)

## 2023-05-18 NOTE — Progress Notes (Addendum)
OV 12/20/2022 -evaluation of ILD care.  Referred by Dr. Halford Chessman from [place] pulmonary location.  Subjective:  Patient ID: Vincent Meyers, male , DOB: 03/04/1946 , age 77 y.o. 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 Last CT scan was 2022. Echocardiogram was 11/11/2022. Pt is only on Albuterol as needed.      HPI Vincent Meyers 77 y.o. -history is gained talking to the patient and review of the external record.  He tells me that his primary care physician show told him some 5 years ago that he had scarring in the lung.  Then 2 years ago he was in [place] and got hospitalized for unrelated reasons and was told he had pulmonary fibrosis.  After that he did see Dr. Halford Chessman.  So he feels that he has had 5 years worth of insidious onset of shortness of breath that is slowly progressive.  More progressive in the last year or 2.  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      [facility] Comprehensive ILD Questionnaire  Symptoms:   Past Medical History :  - He does have sleep apnea and uses CPAP -sees Dr. Halford Chessman -He has irritable bowel syndrome and diarrhea.  GI referral in [place] is pending. -Does have acid reflux disease - Has cardiac history not otherwise specified. -Has had COVID in May 2022. -Denies any asthma or COPD.  Has an albuterol inhaler that does not help him.  ROS:  -Has fatigue and chronic pain particularly in the knees and shoulders. - Sometimes liquid just try to go down the windpipe and he chokes.  He  does have diarrhea with irritable bowel syndrome - He had dry mouth after COVID but he does not have that now. - Does have acid reflux  FAMILY HISTORY of LUNG DISEASE:  -Only he has pulmonary fibrosis but otherwise no lung disease in the family  PERSONAL EXPOSURE HISTORY:  -Smoke.  [place].  Greater than 40 pack smoking history.  He has done some mild marijuana in the past.  Never used cocaine.  Never used intravenous drugs.  HOME  EXPOSURE and HOBBY DETAILS :  [place].  He is lived there for 5 years but the home is 58 years.  Previously lived in a 76 year old home.  He believes that might have been some mold or mildew in the shower curtain but is never seen there.  He does use a CPAP but there is no mold or mildew in it.  Detail organic and inorganic antigen exposure history in the house is negative.  OCCUPATIONAL HISTORY (122 questions) : -He is retired but he is worked in [employer].  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  He goes to the shop every few days a week.  He does have a motor home and travel.  He has not been to [place].  No agent orange exposure but he does have some oil heater exposure.  There are some woodwork exposure.  Machinist exposure present.  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  He has done some welding on course.  PULMONARY TOXICITY HISTORY (27 items):  Denies  INVESTIGATIONS: -Last PFT was 1 year ago and compared to 2021 was relatively stable -Last CT scan of the chest December 2022 but this shows progression over the 2 years since 2020     Latest Reference Range & Units 06/02/21 12:11  Anti Nuclear Antibody (ANA) Negative  Negative  RA Latex Turbid. <14 IU/mL <14  Scleroderma (Scl-70) (ENA) Antibody, IgG <1.0 NEG AI <1.0 NEG   HRCT  - last Dec 2022   Narrative &  Impression  CLINICAL DATA:  77 year old male with history of pulmonary fibrosis. Follow-up study.   EXAM: CT CHEST WITHOUT CONTRAST   TECHNIQUE: Multidetector CT imaging of the chest was performed following the standard protocol without intravenous contrast. High resolution imaging of the lungs, as well as inspiratory and expiratory imaging, was performed.   COMPARISON:  Cardiac CT 07/24/2019.   FINDINGS: Cardiovascular: Heart size is normal. There is no significant pericardial fluid, thickening or pericardial calcification. There is aortic atherosclerosis, as well as atherosclerosis of the great vessels of the mediastinum and the coronary arteries, including calcified atherosclerotic plaque in the left main, left anterior descending, left circumflex and right coronary arteries.   Mediastinum/Nodes: No pathologically enlarged mediastinal or hilar lymph nodes. Multiple prominent but nonenlarged mediastinal and hilar lymph nodes are incidentally noted. Esophagus is unremarkable in appearance. No axillary lymphadenopathy.   Lungs/Pleura: High-resolution images demonstrate widespread but patchy areas of ground-glass attenuation, septal thickening, subpleural reticulation, parenchymal banding, traction bronchiectasis and frank honeycombing. Findings have a definitive craniocaudal gradient and are clearly progressive compared to the prior examination from 2020. Inspiratory and expiratory imaging is unremarkable. No acute consolidative airspace disease. No pleural effusions. No suspicious appearing pulmonary nodules or masses are confidently identified upon this background of fibrotic lung disease.   Upper Abdomen: Aortic atherosclerosis.   Musculoskeletal: There are no aggressive appearing lytic or blastic lesions noted in the visualized portions of the skeleton.   IMPRESSION: 1. The appearance of the lungs is considered diagnostic of usual interstitial pneumonia (UIP) per  current ATS guidelines, with clear progression compared to the prior examination, as discussed above. 2. Aortic atherosclerosis, in addition to left main and 3 vessel coronary artery disease. Please note that although the presence of coronary artery calcium documents the presence of coronary artery disease, the severity of this disease and any potential stenosis cannot be assessed on this non-gated CT examination. Assessment for potential risk factor modification, dietary therapy or pharmacologic therapy may be warranted, if clinically indicated.   Aortic Atherosclerosis (ICD10-I70.0).     Electronically Signed   By: Vinnie Langton M.D.   On: 10/31/2021 08:23   ECHO dec 2023    IMPRESSIONS     1. Left ventricular ejection fraction, by estimation, is 60 to 65%. The  left ventricle has normal function. The left ventricle has no regional  wall motion abnormalities. Left ventricular diastolic parameters are  consistent with Grade I diastolic  dysfunction (impaired relaxation). The average left ventricular global  longitudinal strain is -23.8 %. The global longitudinal strain is normal.   2. Right ventricular systolic function is normal. The right ventricular  size is normal.  3. The mitral valve is normal in structure. No evidence of mitral valve  regurgitation. No evidence of mitral stenosis.   4. The aortic valve is tricuspid. Aortic valve regurgitation is not  visualized. No aortic stenosis is present.   5. Aortic dilatation noted. There is mild dilatation of the aortic root,  measuring 37 mm. There is mild dilatation of the ascending aorta,  measuring 37 mm.   6. The inferior vena cava is normal in size with <50% respiratory  variability, suggesting right atrial pressure of 8 mmHg.    OV 02/01/2023  Subjective:  Patient ID: Vincent Meyers, male , DOB: Feb 22, 1946 , age 88 y.o. , MRN: [id number] , ADDRESS: [address] [place] [address] PCP [name],  Raymond J, MD Patient Care Team: [name], [name], MD as PCP - General (Family Medicine) Thurmon Fair, MD as PCP - Cardiology (Cardiology)  This Provider for this visit: Treatment Team:  Attending Provider: Kalman Shan, MD      02/01/2023 -   Chief Complaint  Patient presents with   Follow-up    PFT f/up     HPI Vincent Meyers 77 y.o. -presents for follow-up.  He is on week 2 of pirfenidone.  He is now on 6 pills 3 times daily.  He is already noticing that his diarrhea has gone up.  Otherwise symptoms are stable compared to January 2024.  He did a pulmonary function test and his FVC shows a 13% decline in the last 1 year.  Although the CT scan of the chest states there is no decline in the last 1 year but there is progression on even on the CT scan from 2020.  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  He is behaving like a classic IPF patient.  He says he does do some water walking at [facility] and he does not feel dyspneic but when he does activity he feels dyspneic.  He asked about taking an inhaler for his dyspnea.  I visualized the scan he does not have any emphysema.  Therefore we decided not to add any long-acting inhalers for him.  We discussed pulmonary rehabilitation and he is willing to do that.  Of note he does have chronic diarrhea.  He has seen Dr. [name] for this at [facility].  He has upcoming visit with him.  He will have liver function test today.  He is open to the idea of clinical trials.        CT Chest data - HRCT 01/25/23  Narrative & Impression  CLINICAL DATA:  77 year old male former smoker (quit 35 years ago) with interstitial lung disease. Follow-up study.   EXAM: CT CHEST WITHOUT CONTRAST   TECHNIQUE: Multidetector CT imaging of the chest was performed following the standard protocol without intravenous contrast. High resolution imaging of the lungs, as well as inspiratory and expiratory imaging, was performed.   RADIATION DOSE  REDUCTION: This exam was performed according to the departmental dose-optimization program which includes automated exposure control, adjustment of the mA and/or kV according to patient size and/or use of iterative reconstruction technique.   COMPARISON:  High-resolution chest CT 10/30/2021.   FINDINGS: Cardiovascular: Heart size is normal. There is no significant pericardial fluid, thickening or pericardial calcification. There is aortic atherosclerosis, as well as atherosclerosis of the great vessels of the mediastinum and the coronary arteries, including calcified atherosclerotic plaque in the left main, left anterior descending, left circumflex and right coronary arteries.   Mediastinum/Nodes: Multiple  prominent borderline enlarged mediastinal and hilar lymph nodes, similar to the prior study, presumably chronic and benign in the setting of interstitial lung disease. Esophagus is unremarkable in appearance. No axillary lymphadenopathy.   Lungs/Pleura: High-resolution images again demonstrate widespread but patchy areas of ground-glass attenuation, septal thickening, subpleural reticulation, parenchymal banding, traction bronchiectasis, peripheral bronchiolectasis and some scattered areas of honeycombing (most severe in the base of the right lower lobe). These findings have a definitive craniocaudal gradient and are grossly stable compared to the prior study. No acute consolidative airspace disease. No pleural effusions. No definite suspicious appearing pulmonary nodules or masses. A small bulla in the periphery of the right lower lobe seen on the prior study has substantially enlarged compared to the prior examination.   Upper Abdomen: Aortic atherosclerosis.   Musculoskeletal: There are no aggressive appearing lytic or blastic lesions noted in the visualized portions of the skeleton.   IMPRESSION: 1. Fibrotic changes in the lungs appear grossly stable compared to the  prior study, once again considered diagnostic of usual interstitial pneumonia (UIP) per current ATS guidelines. 2. Aortic atherosclerosis, in addition to left main and three-vessel coronary artery disease. Please note that although the presence of coronary artery calcium documents the presence of coronary artery disease, the severity of this disease and any potential stenosis cannot be assessed on this non-gated CT examination. Assessment for potential risk factor modification, dietary therapy or pharmacologic therapy may be warranted, if clinically indicated.   Aortic Atherosclerosis (ICD10-I70.0).     Electronically Signed   By: Trudie Reed M.D.   On: 01/25/2023 11:54   03/15/2023 Follow up : IPF and OSA  Patient returns for a 6-week follow-up.  Patient was seen last visit for IPF.  Patient is maintained on Esbriet.  Last visit patient was recommended to titrate up to full dosing slowly.  He had been having some stomach issues with diarrhea.  He also was referred to GI.  Patient was seen by GI and underwent a colonoscopy, biopsies were negative for microscopic colitis.  Positive adenoma.  Plan for a repeat colonoscopy in 1 year.  And change from Prilosec to Pepcid.  Felt to have a possible component of IBS.  Since last visit patient has reached full dose of Esbriet.  He says that he has had some intermittent diarrhea but over the last week or so it has gotten substantially better.  Patient says he still remains active.  He works on cars and motorcycles.  Is able to do some yard work.  He does have to rest frequently.  Patient is not on any oxygen.  Walk test today in the office which showed no significant desaturations with O2 saturations remained above 90% with ambulation.  Patient was referred to pulmonary rehab.  He was contacted but has not received start date. Went on trip to [place] to see [place]. Did really well, had a great trip .   Patient has underlying sleep apnea is on  nocturnal CPAP says he wears his CPAP every night cannot sleep without it.  CPAP download shows excellent compliance with 100% usage.  Daily average usage at 9 hours.  Patient is on auto CPAP 5 to 8 cm H2O.  AHI 3.4/hour.     Chest Imaging:  Cardiac CT 07/25/19 >> mild fibrotic changes at bases Rt > Lt CT chest 08/24/20 >> UIP pattern High-resolution CT chest January 25, 2023 stable fibrotic changes, consistent with UIP.   Sleep Tests:  HST 10/25/20 >> AHI 23.4, SpO2  low 78% Auto CPAP 08/26/21 to 09/24/21 >> used on 27 of 30 nights with average 8 hrs 46 min.  Average AHI 5.5 with median CPAP 7 and 95 th percentile CPAP 11 cm H2O  OV 05/18/2023  Subjective:  Patient ID: Vincent Meyers, male , DOB: September 16, 1946 , age 63 y.o. , MRN: [id number] , ADDRESS: [address] [place] [address] PCP [name], [name], MD Patient Care Team: [name], [name], MD as PCP - General (Family Medicine) Croitoru, Rachelle Hora, MD as PCP - Cardiology (Cardiology)  This Provider for this visit: Treatment Team:  Attending Provider: Kalman Shan, MD  IPF dx 12/20/22: Based on age greater than 59, Caucasian, male gender, type of occupation, previous smoking, UIP and progression this is IPF especially with negative serology.  -Started pirfenidone mid to late February 2024; low-dose protocol in the setting of chronic diarrhea. ->  Full dose by March/April 2024.  - Last HRCT March 2024  GI workup for chronic diarrhea March 15, 2023:   OSA:   - Auto CPAP 08/26/21 to 09/24/21 >> used on 27 of 30 nights with average 8 hrs 46 min.  Average AHI 5.5 with median CPAP 7 and 95 th percentile CPAP 11 cm H2O  05/18/2023 -   Chief Complaint  Patient presents with   Follow-up    F/up on ILD     HPI Vincent Meyers 77 y.o. 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  After my last visit he did see nurse practitioner in April 2024.  He also saw Dr. Amada Jupiter and his findings are summarized above.  He is currently on full  dose pirfenidone.  His last liver function test was in April 2024.  He needs another liver function test today.  He tells me overall he is stable.  In the interim he did take his [place] motor bikes in [place] [employer] for display in [place].  He feels he is doing stable.  However his main concern is that his fatigue is more with pirfenidone.  In addition is also gained 7 pounds of weight.  He says the medicine is making him eat more.  He is actually taking more refined carbohydrates.  He says he loves nuts and is agreed to increase his nut intake.  He wanted know how the medication works.  I did tell him it was preventative.  His symptom scores below.  We did a sit/stand exercise hypoxemia test and it was positive.  He did desaturate to 82%.  He says at home he does drop pulse ox.  At night he is not using oxygen he does not have portable system with him.  He is open to the idea of having oxygen with him.  Other than that: He wants to roll over to the big pill pirfenidone.  I said we could do that. Esbriet/Pirfenidone requires intensive drug monitoring due to high concerns for Adverse effects of , including  Drug Induced Liver Injury, significant GI side effects that include but not limited to Diarrhea, Nausea, Vomiting,  and other system side effects that include Fatigue, headaches, weight loss and other side effects such as skin rash. These will be monitored with  blood work such as LFT initially once a month for 6 months and then quarterly  SYMPTOM SCALE - ILD 12/20/2022 02/01/2023 ESBIRET X 2 WEEKS 05/18/2023 212 #  Current weight     O2 use 0-uses CPAP at night but without oxygen.    Shortness of Breath  0 -> 5 scale with 5 being worst (score 6 If unable to do)    At rest 1 0 0  Simple tasks - showers, clothes change, eating, shaving 2 1 2   Household (dishes, doing bed, laundry) 3 3 2   Shopping 2 1 2   Walking level at own pace 2 2 2   Walking up Stairs 4 4 4   Total (30-36) Dyspnea Score 14 11 12        Non-dyspnea symptoms (0-> 5 scale) 12/20/2022 02/01/2023  05/18/2023   How bad is your cough? 1 NONE 0  How bad is your fatigue 2 MILD-MOD 4  How bad is nausea 0 NONE 0  How bad is vomiting?  0 NONE 0  How bad is diarrhea? 3 BAD AND WORSE AFTER ESBRIET 2  How bad is anxiety? 0 OK 1  How bad is depression 1 NONE 1  Any chronic pain - if so where and how bad 2      Simple office walk 224 (66+46 x 2) feet Pod A at [facility] x  3 laps goal with forehead probe 05/18/2023    O2 used ra   Number laps completed Sit stand x 10 times   Comments about pace good   Resting Pulse Ox/HR 95% and 78/min   Final Pulse Ox/HR 92-87% and 84/min   Desaturated </= 88% yes   Desaturated <= 3% points yes   Got Tachycardic >/= 90/min no   Symptoms at end of test Dyspnea 5/10   Miscellaneous comments x          Latest Ref Rng & Units 02/01/2023    8:57 AM 11/26/2021    2:58 PM 11/17/2020    2:54 PM  PFT Results  FVC-Pre L 2.84  3.27  P 3.28   FVC-Predicted Pre % 74  83  P 82   FVC-Post L 2.87  3.31  P 3.16   FVC-Predicted Post % 75  83  P 79   Pre FEV1/FVC % % 72  68  P 83   Post FEV1/FCV % % 69  87  P 85   FEV1-Pre L 2.04  2.22  P 2.73   FEV1-Predicted Pre % 74  77  P 94   FEV1-Post L 1.97  2.89  P 2.69   DLCO uncorrected ml/min/mmHg 14.61  15.89  P 16.02   DLCO UNC% % 62  66  P 67   DLCO corrected ml/min/mmHg 14.61  15.89  P 16.02   DLCO COR %Predicted % 62  66  P 67   DLVA Predicted % 89  94  P 88   TLC L 4.50  4.78  P 4.36   TLC % Predicted % 68  72  P 65   RV % Predicted % 64  62  P 47     P Preliminary result     Latest Reference Range & Units 04/08/22 12:59 11/18/22 11:04 02/01/23 10:55 03/15/23 10:43  AST 0 - 37 U/L 24 23 23 20   ALT 0 - 53 U/L 20 18 20 17     Latest Reference Range & Units 04/08/22 12:59 11/18/22 11:04 04/01/23 13:47  Hemoglobin 13.0 - 17.0 g/dL 08.6 57.8 46.9    has a past medical history of Angina pectoris (HCC), Anxiety, Arthritis, Diabetes mellitus  without complication (HCC), Erectile dysfunction, Hyperlipidemia, Hypertension, IBS (irritable bowel syndrome), ILD (interstitial lung disease) (HCC), and OSA (obstructive sleep apnea).   reports that he quit smoking about 35 years ago. His  smoking use included cigarettes. He started smoking about 58 years ago. He has a 46.00 pack-year smoking history. He has never used smokeless tobacco.  Past Surgical History:  Procedure Laterality Date   APPENDECTOMY     COLON SURGERY     perforated bowel and hernia repair   ILIAC ARTERY ANEURYSM REPAIR Left 2006   Dr. Arbie Cookey (redo surgery)   REPLACEMENT TOTAL KNEE BILATERAL     TONSILLECTOMY     age 51    No Known Allergies  Immunization History  Administered Date(s) Administered   Fluad Quad(high Dose 65+) 08/23/2022   Influenza Split 09/19/2015   Influenza, High Dose Seasonal PF 10/24/2019   Influenza, Quadrivalent, Recombinant, Inj, Pf 08/22/2017   Influenza,inj,Quad PF,6+ Mos 10/11/2016, 10/24/2018   Influenza,inj,Quad PF,6-35 Mos 10/11/2016, 10/24/2018   Influenza,inj,quad, With Preservative 09/19/2015   Influenza,trivalent, recombinat, inj, PF 08/22/2017   PFIZER(Purple Top)SARS-COV-2 Vaccination 01/06/2020, 01/29/2020   Pneumococcal Conjugate-13 05/19/2016   Pneumococcal Polysaccharide-23 09/05/2012   Tdap 03/12/2015   Unspecified SARS-COV-2 Vaccination 08/16/2022   Zoster, Live 11/05/2013    Family History  Problem Relation Age of Onset   Parkinson's disease Mother    Heart attack Father    Stroke Father    Alzheimer's disease Father    Alzheimer's disease Sister    Other Sister        degenerative muscle disease   Hypertension Daughter    Obesity Daughter    Heart murmur Daughter    Colon cancer Neg Hx    Esophageal cancer Neg Hx    Rectal cancer Neg Hx    Stomach cancer Neg Hx      Current Outpatient Medications:    albuterol (VENTOLIN HFA) 108 (90 Base) MCG/ACT inhaler, Inhale 1-2 puffs into the lungs every 6 (six)  hours as needed., Disp: 8 g, Rfl: 2   amLODipine (NORVASC) 5 MG tablet, TAKE 1 TABLET (5 MG TOTAL) BY MOUTH DAILY., Disp: 30 tablet, Rfl: 4   aspirin EC 81 MG tablet, Take by mouth., Disp: , Rfl:    atenolol (TENORMIN) 50 MG tablet, TAKE 1 AND 1/2 TABLETS DAILY BY MOUTH, Disp: 135 tablet, Rfl: 1   famotidine (PEPCID) 40 MG tablet, TAKE 1 TABLET BY MOUTH EVERY DAY, Disp: 90 tablet, Rfl: 1   Flaxseed, Linseed, (FLAXSEED OIL PO), Take by mouth., Disp: , Rfl:    fluticasone (FLONASE) 50 MCG/ACT nasal spray, Place 2 sprays into both nostrils daily., Disp: , Rfl:    hydrochlorothiazide (HYDRODIURIL) 25 MG tablet, TAKE 1 TABLET BY MOUTH EVERY DAY, Disp: 90 tablet, Rfl: 0   lisinopril (ZESTRIL) 40 MG tablet, TAKE 1 TABLET BY MOUTH EVERY DAY, Disp: 90 tablet, Rfl: 1   Magnesium Gluconate 550 MG TABS, Take by mouth., Disp: , Rfl:    Na Sulfate-K Sulfate-Mg Sulf 17.5-3.13-1.6 GM/177ML SOLN, Take by mouth as directed., Disp: , Rfl:    omeprazole (PRILOSEC) 20 MG capsule, TAKE 1 CAPSULE BY MOUTH EVERY DAY, Disp: 90 capsule, Rfl: 1   Pirfenidone 267 MG TABS, Increase to 9 pills [3 pills 3 times daily]., Disp: 270 tablet, Rfl: 3   Probiotic Product (PROBIOTIC-10 PO), Take by mouth., Disp: , Rfl:    sildenafil (REVATIO) 20 MG tablet, Take 1 tablet (20 mg total) by mouth daily as needed., Disp: 30 tablet, Rfl: 0   simvastatin (ZOCOR) 40 MG tablet, TAKE 1 TABLET(40 MG) BY MOUTH EVERY NIGHT, Disp: 90 tablet, Rfl: 1   Turmeric (QC TUMERIC COMPLEX PO), Take by mouth., Disp: , Rfl:  Objective:   Vitals:   05/18/23 0926  BP: 130/70  Pulse: 73  SpO2: 95%  Weight: 212 lb 3.2 oz (96.3 kg)  Height: 5' 7.5" (1.715 m)    Estimated body mass index is 32.74 kg/m as calculated from the following:   Height as of this encounter: 5' 7.5" (1.715 m).   Weight as of this encounter: 212 lb 3.2 oz (96.3 kg).  @WEIGHTCHANGE @  [employer]   05/18/23 0926  Weight: 212 lb 3.2 oz (96.3 kg)     Physical  Exam   General: No distress. Looks well. BEARD + O2 at rest: no Cane present: no Sitting in wheel chair: no Frail: no Obese: no Neuro: Alert and Oriented x 3. GCS 15. Speech normal Psych: Pleasant Resp:  Barrel Chest - no.  Wheeze - no, Crackles - YES BASE, No overt respiratory distress CVS: Normal heart sounds. Murmurs - no Ext: Stigmata of Connective Tissue Disease - n HEENT: Normal upper airway. PEERL +. No post nasal drip        Assessment:       ICD-10-CM   1. IPF (idiopathic pulmonary fibrosis) (HCC)  J84.112     2. 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 Encounter for therapeutic drug monitoring  Z51.81     4. Exercise hypoxemia  R09.02          Plan:     Patient Instructions     ICD-10-CM   1. IPF (idiopathic pulmonary fibrosis) (HCC)  J84.112     2. 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 Encounter for therapeutic drug monitoring  Z51.81     4. Diarrhea due to drug  K52.1       Pulmonary fibrosis is been slowly progressive between 2020 and 2024.  There is at least a greater than 10% decline in pulmonary function test between 2023 and March 2024.  Glad you are on pirfenidone since early February 2024 and currently on full dose and tolerating it well.  Plan - Do liver function test today 05/18/2023 -Continue full dose protocol of pirfenidone but roll over to the 801 mg/day [large pill] at 1 pill 3 times daily  -Take it with food  -Apply sunscreen  -Space medications at least 5-6 hours apart -Combat weight gain  -Eat more nuts and plain [place] yogurt  -Avoid simple carbohydrates and refined sugars --Test for overnight pulse oximetry at home on room air with your CPAP on -Start 2 L oxygen portable system -Do spirometry and DLCO in 2-3 months -Consider clinical trials in the future  Follow-up - Return in 8-12 weeks to see see Dr. Marchelle Gearing 30-minute visit-     SIGNATURE    Dr. Kalman Shan, M.D.,  F.C.C.P,  Pulmonary and Critical Care Medicine Staff Physician, [facility] Center Director - Interstitial Lung Disease  Program  Pulmonary Fibrosis [facility] at [facility], [place], [place], [address]  Pager: [phone], If no answer or between  15:00h - 7:00h: call [phone] Telephone: [phone]  9:47 AM 05/18/2023   HIGh Complexity  OFFICE   2021 E/M guidelines, first released in 2021, with minor revisions added in 2023. Must meet the requirements for 2 out of 3 dimensions to qualify.    Number and complexity of problems addressed Amount and/or complexity of data reviewed Risk of complications and/or morbidity  Severe exacerbation of chronic illness  Acute or  chronic illnesses that may pose a threat to life or bodily function, e.g., multiple trauma, acute MI, pulmonary embolus, severe respiratory distress, progressive rheumatoid arthritis, psychiatric illness with potential threat to self or others, peritonitis, acute renal failure, abrupt change in neurological status Must meet the requirements for 2 of 3 of the categories)  Category 1: Tests and documents, historian  Any combination of 3 of the following:  Assessment requiring an independent historian  Review of prior external note(s) from each unique source - GI   Review of results of each unique test - blood work LFT - blood work LFT  Ordering of each unique test _ LFT and and PFT    Category 2: Interpretation of tests    Independent interpretation of a test performed by another physician/other qualified health care professional (not separately reported)  Category 3: Discuss management/tests  Discussion of management or test interpretation with external physician/other qualified health care professional/appropriate source (not separately reported)  - dw Pharmacy team to roll over to large pill esbreit  HIGH risk of morbidity from additional diagnostic testing or  treatment Examples only:  Drug therapy requiring intensive monitoring for toxicity  Decision for elective major surgery with identified pateint or procedure risk factors  Decision regarding hospitalization or escalation of level of care  Decision for DNR or to de-escalate care   Parenteral controlled  substances

## 2023-05-18 NOTE — Patient Instructions (Addendum)
ICD-10-CM   1. IPF (idiopathic pulmonary fibrosis) (HCC)  J84.112     2. 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 Encounter for therapeutic drug monitoring  Z51.81     4. Diarrhea due to drug  K52.1       Pulmonary fibrosis is been slowly progressive between 2020 and 2024.  There is at least a greater than 10% decline in pulmonary function test between 2023 and March 2024.  Glad you are on pirfenidone since early February 2024 and currently on full dose and tolerating it well.  Plan - Do liver function test today 05/18/2023 -Continue full dose protocol of pirfenidone but roll over to the 801 mg/day [large pill] at 1 pill 3 times daily   -Take it with food  -Apply sunscreen  -Space medications at least 5-6 hours apart -Combat weight gain  -Eat more nuts and plain [place] yogurt  -Avoid simple carbohydrates and refined sugars --Test for overnight pulse oximetry at home on room air with your CPAP on -Start 2 L oxygen portable system -Do spirometry and DLCO in 2-3 months -Consider clinical trials in the future  Follow-up - Return in 8-12 weeks to see see Dr. Marchelle Gearing 30-minute visit-

## 2023-05-18 NOTE — Telephone Encounter (Signed)
Rx team  He is tolerating full dose pirfenidone really well.  He wants to move over to the 801 mg/day dosing at 1 large pill 3 times daily.  Please facilitate

## 2023-05-18 NOTE — Addendum Note (Signed)
Addended by: Hedda Slade on: 05/18/2023 11:58 AM   Modules accepted: Orders

## 2023-05-18 NOTE — Addendum Note (Signed)
Addended by: Hedda Slade on: 05/18/2023 10:03 AM   Modules accepted: Orders

## 2023-05-19 ENCOUNTER — Telehealth: Payer: Self-pay | Admitting: Internal Medicine

## 2023-05-19 DIAGNOSIS — J449 Chronic obstructive pulmonary disease, unspecified: Secondary | ICD-10-CM

## 2023-05-19 DIAGNOSIS — J849 Interstitial pulmonary disease, unspecified: Secondary | ICD-10-CM

## 2023-05-19 NOTE — Telephone Encounter (Signed)
[employer] calling. They need:  Updated Order w/"continuous" on the order and the lt flow. Also need walk test results and those need to be signed by provider.  [employer] Elease Hashimoto [phone]

## 2023-05-24 NOTE — Telephone Encounter (Signed)
[facility] can we please get this order re faxed

## 2023-05-24 NOTE — Telephone Encounter (Signed)
Order has been updated and fixed

## 2023-05-31 DIAGNOSIS — G4730 Sleep apnea, unspecified: Secondary | ICD-10-CM | POA: Diagnosis not present

## 2023-05-31 DIAGNOSIS — J84112 Idiopathic pulmonary fibrosis: Secondary | ICD-10-CM | POA: Diagnosis not present

## 2023-06-06 ENCOUNTER — Telehealth: Payer: Self-pay | Admitting: Internal Medicine

## 2023-06-06 NOTE — Telephone Encounter (Signed)
    Lorin Gawron Stevick had ONO 05/31/23  <= 88% for 1h 21 min  Plan  Start 2L [place] at night

## 2023-06-07 NOTE — Telephone Encounter (Signed)
 Called the pt and there was no answer- LMTCB    

## 2023-06-08 ENCOUNTER — Encounter: Payer: Self-pay | Admitting: Physician Assistant

## 2023-06-08 ENCOUNTER — Ambulatory Visit: Payer: Medicare HMO | Attending: Physician Assistant | Admitting: Physician Assistant

## 2023-06-08 VITALS — BP 132/78 | HR 69 | Ht 67.0 in | Wt 212.4 lb

## 2023-06-08 DIAGNOSIS — E785 Hyperlipidemia, unspecified: Secondary | ICD-10-CM

## 2023-06-08 DIAGNOSIS — E119 Type 2 diabetes mellitus without complications: Secondary | ICD-10-CM

## 2023-06-08 DIAGNOSIS — I2510 Atherosclerotic heart disease of native coronary artery without angina pectoris: Secondary | ICD-10-CM | POA: Diagnosis not present

## 2023-06-08 DIAGNOSIS — I10 Essential (primary) hypertension: Secondary | ICD-10-CM | POA: Diagnosis not present

## 2023-06-08 DIAGNOSIS — J8410 Pulmonary fibrosis, unspecified: Secondary | ICD-10-CM | POA: Diagnosis not present

## 2023-06-08 NOTE — Progress Notes (Signed)
Cardiology Office Note:  .   Date:  06/08/2023  ID:  Vincent Meyers, DOB [birth year], MRN [id number] PCP: [name], Vincent J, MD  [facility] Providers Cardiologist:  Vincent Fair, MD     History of Present Illness: .   Vincent Meyers is a 77 y.o. male with a hx of CAD, PAD, HTN, HLD, DM II, OSA on CPAP and pulmonary fibrosis.  He has previously underwent intervention by Dr. Arbie Meyers.  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  A coronary CT obtained on 07/24/2019 showed coronary calcium score in the 96 percentile, 50 to 60% mid to distal RCA, greater than 70% PDA, 24 to 49% left main, 50 to 69% proximal to mid LAD, greater than 75% D1 lesion.  FFR showed significant stenosis in PDA and ostial D1.  He was treated medically since majority of the severe disease was seen in the distal vessel with exception of D1.  He has chronic stable angina.  Previous high-resolution CT obtained on 10/30/2021 CT showed interstitial pneumonia, with clear progression compared to the previous examination, aortic atherosclerosis including left main and three-vessel CAD.  Last PFT obtained in January 2023 showed FEV1 77%, FVC 83%.   I last saw the patient in November 2023 at which time his primary concern was dyspnea on exertion.  I suspect that his pulmonary issue is the primary driver behind the shortness of breath.  I did recommend echocardiogram.  Echocardiogram performed on 11/11/2022 showed EF 60 to 65%, grade 1 DD, no regional wall motion abnormality, no significant valve issue, mildly dilated aortic root measuring at 37 mm.  Patient presents today for follow-up.  He gets short of breath walking up an incline.  However he can still walk on flat ground for a long distance without issue.  He was recently seen by Dr. Marchelle Meyers of pulmonology service who ordered home O2 for him.  He has not received the machine.  He has  also been taking pirfenidone for pulmonary fibrosis.  Blood pressure is fairly controlled, although he has been noticing occasional blood pressure spikes in the 160s.  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  He denies any exertional chest pain.  EKG is unchanged.  Overall, he has been doing well and that he can follow-up with Dr. Royann Meyers in 65-month.  ROS:   He denies chest pain, palpitations, dyspnea, pnd, orthopnea, n, v, dizziness, syncope, edema, weight gain, or early satiety. All other systems reviewed and are otherwise negative except as noted above.    Studies Reviewed: .        Cardiac Studies & Procedures       ECHOCARDIOGRAM  ECHOCARDIOGRAM COMPLETE 11/11/2022  Narrative ECHOCARDIOGRAM REPORT    Patient Name:   Vincent Meyers Date of Exam: 11/11/2022 Medical Rec #:  [id number]      Height:       68.0 in Accession #:    [id number]     Weight:       206.6 lb Date of Birth:  12/23/1945       BSA:          2.072 m Patient Age:    76 years       BP:           160/72 mmHg Patient Gender: M  HR:           74 bpm. Exam Location:  Vincent [facility]ers  Procedure: 2D Echo, Cardiac Doppler, Color Doppler, 3D Echo and Strain Analysis  Indications:    R06.02 SOB  History:        Patient has prior history of Echocardiogram examinations, most recent 08/23/2020. Risk Factors:Diabetes, Hypertension and Dyslipidemia.  Sonographer:    Vincent Meyers RDCS Referring Phys: [id number] Vincent Meyers  IMPRESSIONS   1. Left ventricular ejection fraction, by estimation, is 60 to 65%. The left ventricle has normal function. The left ventricle has no regional wall motion abnormalities. Left ventricular diastolic parameters are consistent with Grade I diastolic dysfunction (impaired relaxation). The average left ventricular global longitudinal strain is -23.8 %. The global longitudinal strain is normal. 2. Right ventricular systolic function is  normal. The right ventricular size is normal. 3. The mitral valve is normal in structure. No evidence of mitral valve regurgitation. No evidence of mitral stenosis. 4. The aortic valve is tricuspid. Aortic valve regurgitation is not visualized. No aortic stenosis is present. 5. Aortic dilatation noted. There is mild dilatation of the aortic root, measuring 37 mm. There is mild dilatation of the ascending aorta, measuring 37 mm. 6. The inferior vena cava is normal in size with <50% respiratory variability, suggesting right atrial pressure of 8 mmHg.  FINDINGS Left Ventricle: Left ventricular ejection fraction, by estimation, is 60 to 65%. The left ventricle has normal function. The left ventricle has no regional wall motion abnormalities. The average left ventricular global longitudinal strain is -23.8 %. The global longitudinal strain is normal. The left ventricular internal cavity size was normal in size. There is no left ventricular hypertrophy. Left ventricular diastolic parameters are consistent with Grade I diastolic dysfunction (impaired relaxation).  Right Ventricle: The right ventricular size is normal. No increase in right ventricular wall thickness. Right ventricular systolic function is normal.  Left Atrium: Left atrial size was normal in size.  Right Atrium: Right atrial size was normal in size.  Pericardium: There is no evidence of pericardial effusion.  Mitral Valve: The mitral valve is normal in structure. No evidence of mitral valve regurgitation. No evidence of mitral valve stenosis.  Tricuspid Valve: The tricuspid valve is normal in structure. Tricuspid valve regurgitation is not demonstrated. No evidence of tricuspid stenosis.  Aortic Valve: The aortic valve is tricuspid. Aortic valve regurgitation is not visualized. No aortic stenosis is present.  Pulmonic Valve: The pulmonic valve was normal in structure. Pulmonic valve regurgitation is trivial. No evidence of pulmonic  stenosis.  Aorta: Aortic dilatation noted. There is mild dilatation of the aortic root, measuring 37 mm. There is mild dilatation of the ascending aorta, measuring 37 mm.  Venous: The inferior vena cava is normal in size with less than 50% respiratory variability, suggesting right atrial pressure of 8 mmHg.  IAS/Shunts: No atrial level shunt detected by color flow Doppler.   LEFT VENTRICLE PLAX 2D LVIDd:         5.10 cm   Diastology LVIDs:         2.80 cm   LV e' medial:    5.77 cm/s LV PW:         1.00 cm   LV E/e' medial:  10.5 LV IVS:        1.00 cm   LV e' lateral:   7.62 cm/s LVOT diam:     2.40 cm   LV E/e' lateral: 8.0 LVOT Area:     4.52  cm 2D Longitudinal Strain 2D Strain GLS (A2C):   -26.9 % 2D Strain GLS (A3C):   -23.5 % 2D Strain GLS (A4C):   -20.9 % 2D Strain GLS Avg:     -23.8 %  3D Volume EF: 3D EF:        57 % LV EDV:       201 ml LV ESV:       87 ml LV SV:        114 ml  RIGHT VENTRICLE RV Basal diam:  3.50 cm RV Mid diam:    3.70 cm RV S prime:     14.00 cm/s TAPSE (M-mode): 1.9 cm  LEFT ATRIUM             Index        RIGHT ATRIUM           Index LA diam:        4.00 cm 1.93 cm/m   RA Area:     17.50 cm LA Vol (A2C):   47.9 ml 23.12 ml/m  RA Volume:   45.20 ml  21.81 ml/m LA Vol (A4C):   49.0 ml 23.65 ml/m LA Biplane Vol: 51.3 ml 24.76 ml/m  AORTA Ao Root diam: 3.70 cm Ao Asc diam:  3.70 cm  MITRAL VALVE MV Area (PHT): 3.12 cm    SHUNTS MV Decel Time: 243 msec    Systemic Diam: 2.40 cm MV E velocity: 60.80 cm/s MV A velocity: 80.70 cm/s MV E/A ratio:  0.75  Chilton Si MD Electronically signed by Chilton Si MD Signature Date/Time: 11/11/2022/2:28:06 PM    Final     CT SCANS  CT CORONARY MORPH W/CTA COR W/SCORE 07/24/2019  Addendum 07/25/2019  2:48 PM ADDENDUM REPORT: 07/25/2019 14:46  CLINICAL DATA:  77 year old male with h/o DM, HLP, HTN and typical chest pain.  EXAM: Cardiac/Coronary  CTA  TECHNIQUE: The  patient was scanned on a [employer].  FINDINGS: A 100 kV prospective scan was triggered in the descending thoracic aorta at 111 HU's. Axial non-contrast 3 mm slices were carried out through the heart. The data set was analyzed on a dedicated work station and scored using the Agatson method. Gantry rotation speed was 250 msecs and collimation was .6 mm. 100 mg of PO metoprolol and 0.8 mg of sl NTG was given. The 3D data set was reconstructed in 5% intervals of the 67-82 % of the R-R cycle. Diastolic phases were analyzed on a dedicated work station using MPR, MIP and VRT modes. The patient received 80 cc of contrast.  Aorta: Normal size. Mild diffuse atherosclerotic plaque, no dissection.  Aortic Valve:  Trileaflet.  Minimal calcifications.  Coronary Arteries:  Normal coronary origin.  Right dominance.  RCA is a large dominant artery that gives rise to PDA and PLA. There is diffuse moderate, predominantly calcified plaque with stenosis 24-49% in the proximal and 50-59% in the mid and distal portion. PDA is a small caliber vessel that has a focal non-calcified plaque in the mid portion with stenosis > 70%. PDA lumen measures 2.1 mm.  Left main is a large artery that gives rise to LAD and LCX arteries. Left main has mild diffuse calcified plaque with stenosis 24-49%.  LAD is a large vessel that gives rise to one large diagonal artery. Proximal LAD has a moderate diffuse calcified plaque with stenosis 50-69%. Mid LAD has mild diffuse calcified plaque with a focal stenosis 50-69%. Distal LAD has mild diffuse plaque.  D1 is  a large artery (3.5 mm in diameter) that has severe non-calcified ostial plaque with stenosis suspicious for > 70%. Mid portion of D1 has mild diffuse calcified plaque with stenosis 25-49%.  LCX is a small non-dominant artery that gives rise to one small OM1 branch. There is minimal plaque.  Other findings:  Normal pulmonary vein drainage into the  left atrium.  Normal left atrial appendage without a thrombus.  Normal size of the pulmonary artery.  IMPRESSION: 1. Coronary calcium score of 2768. This was 46 percentile for age and sex matched control.  2. Normal coronary origin with right dominance.  3. Suspicion for a severe stenosis in the mid portion of a small PDA and ostial portion of a large 1. diagonal artery. CAD-RADS 4 Severe stenosis. Additional analysis with CT FFR will be submitted.   Electronically Signed By: Tobias Alexander On: 07/25/2019 14:46  Narrative EXAM: OVER-READ INTERPRETATION  CT CHEST  The following report is an over-read performed by radiologist Dr. Trudie Reed of [facility] on 07/24/2019. This over-read does not include interpretation of cardiac or coronary anatomy or pathology. The coronary calcium score/coronary CTA interpretation by the cardiologist is attached.  COMPARISON:  None.  FINDINGS: Aortic atherosclerosis. Mild fibrotic changes in the lung bases bilaterally (right greater than left). Within the visualized portions of the thorax there are no suspicious appearing pulmonary nodules or masses, there is no acute consolidative airspace disease, no pleural effusions, no pneumothorax and no lymphadenopathy. Visualized portions of the upper abdomen are unremarkable. There are no aggressive appearing lytic or blastic lesions noted in the visualized portions of the skeleton.  IMPRESSION: 1.  Aortic Atherosclerosis (ICD10-I70.0). 2. Findings in the lung bases concerning for early pulmonary fibrosis. Outpatient referral to Pulmonology for further evaluation is recommended.  Electronically Signed: By: Trudie Reed M.D. On: 07/24/2019 10:18          Risk Assessment/Calculations:            Physical Exam:   VS:  There were no vitals taken for this visit.   Wt Readings from Last 3 Encounters:  05/18/23 212 lb 3.2 oz (96.3 kg)  04/26/23 211 lb (95.7 kg)   03/15/23 210 lb (95.3 kg)    GEN: Well nourished, well developed in no acute distress NECK: No JVD; No carotid bruits CARDIAC: RRR, no murmurs, rubs, gallops RESPIRATORY:  Clear to auscultation without rales, wheezing or rhonchi  ABDOMEN: Soft, non-tender, non-distended EXTREMITIES:  No edema; No deformity   ASSESSMENT AND PLAN: .    CAD: Denies any recent chest pain.  On aspirin, atenolol and simvastatin  Hypertension: Blood pressure stable  Hyperlipidemia: Continue simvastatin  DM2: Managed by primary care provider  Pulmonary fibrosis: Followed by pulmonology service.  Has dyspnea with more strenuous activity but not with every day activity.  On pirfenidone       Dispo: Follow-up with Dr. Royann Meyers in 6 months.  Signed, Azalee Course, PA

## 2023-06-08 NOTE — Patient Instructions (Signed)
Medication Instructions:  Your physician recommends that you continue on your current medications as directed.   Please refer to the Current Medication list given to you today.  *If you need a refill on your cardiac medications before your next appointment, please call your pharmacy*   Lab Work: NONE If you have labs (blood work) drawn today and your tests are completely normal, you will receive your results only by: MyChart Message (if you have MyChart) OR A paper copy in the mail If you have any lab test that is abnormal or we need to change your treatment, we will call you to review the results.  Testing/Procedures: NONE  Follow-Up: At [facility], you and your health needs are our priority.  As part of our continuing mission to provide you with exceptional heart care, we have created designated Provider Care Teams.  These Care Teams include your primary Cardiologist (physician) and Advanced Practice Providers (APPs -  Physician Assistants and Nurse Practitioners) who all work together to provide you with the care you need, when you need it.  We recommend signing up for the patient portal called "MyChart".  Sign up information is provided on this After Visit Summary.  MyChart is used to connect with patients for Virtual Visits (Telemedicine).  Patients are able to view lab/test results, encounter notes, upcoming appointments, etc.  Non-urgent messages can be sent to your provider as well.   To learn more about what you can do with MyChart, go to [email or web address].    Your next appointment:   6 month(s)  Provider:   Thurmon Fair, MD [facility] MENG

## 2023-06-13 ENCOUNTER — Other Ambulatory Visit: Payer: Self-pay

## 2023-06-15 ENCOUNTER — Other Ambulatory Visit (HOSPITAL_COMMUNITY): Payer: Self-pay

## 2023-06-15 ENCOUNTER — Telehealth: Payer: Self-pay | Admitting: Internal Medicine

## 2023-06-15 NOTE — Telephone Encounter (Signed)
Dr. Jane Canary note from 05/18/23 states -Test for overnight pulse oximetry at home on room air with your CPAP on but the order was placed ONO to be done on Room Air -Start 2 L oxygen portable system : Was the patient walked while in the office" Until the 02 order gets placed correctly the patient will not be getting 02 at night or continuous

## 2023-06-15 NOTE — Telephone Encounter (Signed)
I received message from Vincent Meyers with [employer]  In order for the patient to gets his 02 whether it is night time only or continuous the 02 order has to be fixed or corrected Okay it seems like this has been on going since 05/20/2023. An ONO was order and completed 05/31/2023. The order is for continuous with POC. So a few things.   1.If he only needs nocturnal o2 the order will need to be changed from continuous to nocturnal and he will not qualify for a POC due to only being nocturnal he would not need portability on a regular bases.   2.If he does in fact need around the clock O2, we will need walk test results/sats enter. Example will be below.   __% on RA at rest  __%on RA with exertion  __% on __L of O2 with exertion   If the nurse has any questions, He/She is welcome to reach out to me. [phone].   Dr. Jane Canary note states Test for overnight pulse oximetry at home on room air with your CPAP on  "the ONO order was placed on Room Air" -Start 2 L oxygen portable system But MR's message from 7/15 states Note       FARON TUDISCO had ONO 05/31/23   <= 88% for 1h 21 min   Plan  Start 2L [place] at night

## 2023-06-15 NOTE — Telephone Encounter (Signed)
Pt called in bc he is suppose to be getting a oxygen tank and he hasn't heard anything about his order

## 2023-06-15 NOTE — Telephone Encounter (Signed)
Called and spoke with patient. He verbalized understanding of the ONO results. He wants to hold off on the oxygen at night but he is still interested in the daytime oxygen. Per patient, he was not placed on oxygen after his O2 dropped to the lower 80s. I advised him that [employer] will not process the order without any documentation of him being on O2.   I attempted to get him scheduled for just a walk test but since it is at the 30 day mark of his last OV, he would need another OV for insurance purposes.   He has an appt with MR on 08/22. MR does have some sooner appts but they are 15 min appts.   MR, are you ok with [place] moving up his follow up appt in order for him to get his oxygen?

## 2023-06-15 NOTE — Telephone Encounter (Signed)
I sent urgent message to [employer] of them to check on this issue

## 2023-06-17 ENCOUNTER — Other Ambulatory Visit: Payer: Self-pay

## 2023-06-20 ENCOUNTER — Other Ambulatory Visit: Payer: Self-pay

## 2023-06-20 NOTE — Telephone Encounter (Signed)
I will test him for exercise hypoxemia in approx 2 weeks when I see him. If he is getting worse in interim he should make a urgent visit to see APP. I sent him a direct my chart message. No need to call him  unless he calls back

## 2023-06-21 ENCOUNTER — Encounter: Payer: Self-pay | Admitting: Internal Medicine

## 2023-06-27 ENCOUNTER — Other Ambulatory Visit: Payer: Self-pay | Admitting: Family Medicine

## 2023-07-04 ENCOUNTER — Encounter: Payer: Self-pay | Admitting: Internal Medicine

## 2023-07-04 ENCOUNTER — Ambulatory Visit: Payer: Medicare HMO | Admitting: Internal Medicine

## 2023-07-04 VITALS — BP 130/80 | HR 67 | Ht 67.0 in | Wt 214.6 lb

## 2023-07-04 DIAGNOSIS — J84112 Idiopathic pulmonary fibrosis: Secondary | ICD-10-CM | POA: Diagnosis not present

## 2023-07-04 DIAGNOSIS — R0902 Hypoxemia: Secondary | ICD-10-CM | POA: Diagnosis not present

## 2023-07-04 DIAGNOSIS — Z5181 Encounter for therapeutic drug level monitoring: Secondary | ICD-10-CM

## 2023-07-04 DIAGNOSIS — J849 Interstitial pulmonary disease, unspecified: Secondary | ICD-10-CM

## 2023-07-04 NOTE — Patient Instructions (Addendum)
ICD-10-CM   1. ILD (interstitial lung disease) (HCC)  J84.9     2. Encounter for therapeutic drug monitoring  Z51.81     3. Exercise hypoxemia  R09.02         Pulmonary fibrosis is been slowly progressive between 2020 and 2024.  There is at least a greater than 10% decline in pulmonary function test between 2023 and March 2024.  Glad you are on pirfenidone since early February 2024 and currently on full dose and tolerating it well.  LFT nomral 05/18/23  You qualify for 2L [place] day exercise o2   Plan -Continue full dose protocol  801 mg/day [large pill] at 1 pill 3 times daily  -Take it with food  -Apply sunscreen  -Space medications at least 5-6 hours apart -Combat weight gain  -Eat more nuts and plain [place] yogurt  -Avoid simple carbohydrates and refined sugars ---Start 2 L oxygen portable system -Do spirometry and DLCO in 2  months -Consider clinical trials in the future    Have referred you  Follow-up - Return in 8- weeks to see see Dr. Marchelle Gearing 30-minute visit-

## 2023-07-04 NOTE — Progress Notes (Signed)
OV 12/20/2022 -evaluation of ILD care.  Referred by Dr. Craige Cotta from [facility] location.  Subjective:  Patient ID: Vincent Meyers, male , DOB: [birth year] , age 77 y.o. , MRN: [id number] , ADDRESS: 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 Last CT scan was 2022. Echocardiogram was 11/11/2022. Pt is only on Albuterol as needed.      HPI Vincent Meyers 77 y.o. -history is gained talking to the patient and review of the external record.  He tells me that his primary care physician show told him some 5 years ago that he had scarring in the lung.  Then 2 years ago he was in [place] and got hospitalized for unrelated reasons and was told he had pulmonary fibrosis.  After that he did see Dr. Craige Cotta.  So he feels that he has had 5 years worth of insidious onset of shortness of breath that is slowly progressive.  More progressive in the last year or 2.  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      [facility] Comprehensive ILD Questionnaire  Symptoms:   Past Medical History :  - He does have sleep apnea and uses CPAP -sees Dr. Craige Cotta -He has irritable bowel syndrome and diarrhea.  GI referral in [place] is pending. -Does have acid reflux disease - Has cardiac history not otherwise specified. -Has had COVID in May 2022. -Denies any asthma or COPD.  Has an albuterol inhaler that does not help him.  ROS:  -Has fatigue and chronic pain particularly in the knees and shoulders. - Sometimes liquid just try to go down the windpipe and he chokes.  He  does have diarrhea with irritable bowel syndrome - He had dry mouth after COVID but he does not have that now. - Does have acid reflux  FAMILY HISTORY of LUNG DISEASE:  -Only he has pulmonary fibrosis but otherwise no lung disease in the family  PERSONAL EXPOSURE HISTORY:  -Smoke.  1966 1987.  Greater than 40 pack smoking history.  He has done some mild marijuana in the past.  Never used cocaine.  Never used intravenous drugs.  HOME  EXPOSURE and HOBBY DETAILS :  [place]  He is lived there for 5 years but the home is 75 years.  Previously lived in a 76 year old home.  He believes that might have been some mold or mildew in the shower curtain but is never seen there.  He does use a CPAP but there is no mold or mildew in it.  Detail organic and inorganic antigen exposure history in the house is negative.  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  He works with race [employer].  He goes to the shop every few days a week.  He does have a motor home and travel.  He has not been to [place].  No  agent orange exposure but he does have some oil heater exposure.  There are some woodwork exposure.  Machinist exposure present.  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  He has done some welding on course.  PULMONARY TOXICITY HISTORY (27 items):  Denies  INVESTIGATIONS: -Last PFT was 1 year ago and compared to 2021 was relatively stable -Last CT scan of the chest December 2022 but this shows progression over the 2 years since 2020     Latest Reference Range & Units 06/02/21 12:11  Anti Nuclear Antibody (ANA) Negative  Negative  RA Latex Turbid. <14 IU/mL <14  Scleroderma (Scl-70) (ENA) Antibody, IgG <1.0 NEG AI <1.0 NEG   HRCT  - last Dec 2022   Narrative &  Impression  CLINICAL DATA:  77 year old male with history of pulmonary fibrosis. Follow-up study.   EXAM: CT CHEST WITHOUT CONTRAST   TECHNIQUE: Multidetector CT imaging of the chest was performed following the standard protocol without intravenous contrast. High resolution imaging of the lungs, as well as inspiratory and expiratory imaging, was performed.   COMPARISON:  Cardiac CT 07/24/2019.   FINDINGS: Cardiovascular: Heart size is normal. There is no significant pericardial fluid, thickening or pericardial calcification. There is aortic atherosclerosis, as well as atherosclerosis of the great vessels of the mediastinum and the coronary arteries, including calcified atherosclerotic plaque in the left main, left anterior descending, left circumflex and right coronary arteries.   Mediastinum/Nodes: No pathologically enlarged mediastinal or hilar lymph nodes. Multiple prominent but nonenlarged mediastinal and hilar lymph nodes are incidentally noted. Esophagus is unremarkable in appearance. No axillary lymphadenopathy.   Lungs/Pleura: High-resolution images demonstrate widespread but patchy areas of ground-glass attenuation, septal thickening, subpleural reticulation, parenchymal banding, traction bronchiectasis and frank honeycombing. Findings have a definitive craniocaudal gradient and are clearly progressive compared to the prior examination from 2020. Inspiratory and expiratory imaging is unremarkable. No acute consolidative airspace disease. No pleural effusions. No suspicious appearing pulmonary nodules or masses are confidently identified upon this background of fibrotic lung disease.   Upper Abdomen: Aortic atherosclerosis.   Musculoskeletal: There are no aggressive appearing lytic or blastic lesions noted in the visualized portions of the skeleton.   IMPRESSION: 1. The appearance of the lungs is considered diagnostic of usual interstitial pneumonia (UIP) per  current ATS guidelines, with clear progression compared to the prior examination, as discussed above. 2. Aortic atherosclerosis, in addition to left main and 3 vessel coronary artery disease. Please note that although the presence of coronary artery calcium documents the presence of coronary artery disease, the severity of this disease and any potential stenosis cannot be assessed on this non-gated CT examination. Assessment for potential risk factor modification, dietary therapy or pharmacologic therapy may be warranted, if clinically indicated.   Aortic Atherosclerosis (ICD10-I70.0).     Electronically Signed   By: Trudie Reed M.D.   On: 10/31/2021 08:23   ECHO dec 2023    IMPRESSIONS     1. Left ventricular ejection fraction, by estimation, is 60 to 65%. The  left ventricle has normal function. The left ventricle has no regional  wall motion abnormalities. Left ventricular diastolic parameters are  consistent with Grade I diastolic  dysfunction (impaired relaxation). The average left ventricular global  longitudinal strain is -23.8 %. The global longitudinal strain is normal.   2. Right ventricular systolic function is normal. The right ventricular  size is normal.  3. The mitral valve is normal in structure. No evidence of mitral valve  regurgitation. No evidence of mitral stenosis.   4. The aortic valve is tricuspid. Aortic valve regurgitation is not  visualized. No aortic stenosis is present.   5. Aortic dilatation noted. There is mild dilatation of the aortic root,  measuring 37 mm. There is mild dilatation of the ascending aorta,  measuring 37 mm.   6. The inferior vena cava is normal in size with <50% respiratory  variability, suggesting right atrial pressure of 8 mmHg.    OV 02/01/2023  Subjective:  Patient ID: Vincent Meyers, male , DOB: 1946/06/29 , age 19 y.o. , MRN: [id number] , ADDRESS: [address] [place] [address] PCP [name],  Raymond J, MD Patient Care Team: [name], [name], MD as PCP - General (Family Medicine) Thurmon Fair, MD as PCP - Cardiology (Cardiology)  This Provider for this visit: Treatment Team:  Attending Provider: Kalman Shan, MD      02/01/2023 -   Chief Complaint  Patient presents with   Follow-up    PFT f/up     HPI Vincent Meyers 77 y.o. -presents for follow-up.  He is on week 2 of pirfenidone.  He is now on 6 pills 3 times daily.  He is already noticing that his diarrhea has gone up.  Otherwise symptoms are stable compared to January 2024.  He did a pulmonary function test and his FVC shows a 13% decline in the last 1 year.  Although the CT scan of the chest states there is no decline in the last 1 year but there is progression on even on the CT scan from 2020.  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  He is behaving like a classic IPF patient.  He says he does do some water walking at [facility] and he does not feel dyspneic but when he does activity he feels dyspneic.  He asked about taking an inhaler for his dyspnea.  I visualized the scan he does not have any emphysema.  Therefore we decided not to add any long-acting inhalers for him.  We discussed pulmonary rehabilitation and he is willing to do that.  Of note he does have chronic diarrhea.  He has seen Dr. [name] for this at [facility].  He has upcoming visit with him.  He will have liver function test today.  He is open to the idea of clinical trials.        CT Chest data - HRCT 01/25/23  Narrative & Impression  CLINICAL DATA:  77 year old male former smoker (quit 35 years ago) with interstitial lung disease. Follow-up study.   EXAM: CT CHEST WITHOUT CONTRAST   TECHNIQUE: Multidetector CT imaging of the chest was performed following the standard protocol without intravenous contrast. High resolution imaging of the lungs, as well as inspiratory and expiratory imaging, was performed.   RADIATION DOSE  REDUCTION: This exam was performed according to the departmental dose-optimization program which includes automated exposure control, adjustment of the mA and/or kV according to patient size and/or use of iterative reconstruction technique.   COMPARISON:  High-resolution chest CT 10/30/2021.   FINDINGS: Cardiovascular: Heart size is normal. There is no significant pericardial fluid, thickening or pericardial calcification. There is aortic atherosclerosis, as well as atherosclerosis of the great vessels of the mediastinum and the coronary arteries, including calcified atherosclerotic plaque in the left main, left anterior descending, left circumflex and right coronary arteries.   Mediastinum/Nodes: Multiple  prominent borderline enlarged mediastinal and hilar lymph nodes, similar to the prior study, presumably chronic and benign in the setting of interstitial lung disease. Esophagus is unremarkable in appearance. No axillary lymphadenopathy.   Lungs/Pleura: High-resolution images again demonstrate widespread but patchy areas of ground-glass attenuation, septal thickening, subpleural reticulation, parenchymal banding, traction bronchiectasis, peripheral bronchiolectasis and some scattered areas of honeycombing (most severe in the base of the right lower lobe). These findings have a definitive craniocaudal gradient and are grossly stable compared to the prior study. No acute consolidative airspace disease. No pleural effusions. No definite suspicious appearing pulmonary nodules or masses. A small bulla in the periphery of the right lower lobe seen on the prior study has substantially enlarged compared to the prior examination.   Upper Abdomen: Aortic atherosclerosis.   Musculoskeletal: There are no aggressive appearing lytic or blastic lesions noted in the visualized portions of the skeleton.   IMPRESSION: 1. Fibrotic changes in the lungs appear grossly stable compared to the  prior study, once again considered diagnostic of usual interstitial pneumonia (UIP) per current ATS guidelines. 2. Aortic atherosclerosis, in addition to left main and three-vessel coronary artery disease. Please note that although the presence of coronary artery calcium documents the presence of coronary artery disease, the severity of this disease and any potential stenosis cannot be assessed on this non-gated CT examination. Assessment for potential risk factor modification, dietary therapy or pharmacologic therapy may be warranted, if clinically indicated.   Aortic Atherosclerosis (ICD10-I70.0).     Electronically Signed   By: Trudie Reed M.D.   On: 01/25/2023 11:54   03/15/2023 Follow up : IPF and OSA  Patient returns for a 6-week follow-up.  Patient was seen last visit for IPF.  Patient is maintained on Esbriet.  Last visit patient was recommended to titrate up to full dosing slowly.  He had been having some stomach issues with diarrhea.  He also was referred to GI.  Patient was seen by GI and underwent a colonoscopy, biopsies were negative for microscopic colitis.  Positive adenoma.  Plan for a repeat colonoscopy in 1 year.  And change from Prilosec to Pepcid.  Felt to have a possible component of IBS.  Since last visit patient has reached full dose of Esbriet.  He says that he has had some intermittent diarrhea but over the last week or so it has gotten substantially better.  Patient says he still remains active.  He works on cars and motorcycles.  Is able to do some yard work.  He does have to rest frequently.  Patient is not on any oxygen.  Walk test today in the office which showed no significant desaturations with O2 saturations remained above 90% with ambulation.  Patient was referred to pulmonary rehab.  He was contacted but has not received start date. Went on trip to [place] to see [place]. Did really well, had a great trip .   Patient has underlying sleep apnea is on  nocturnal CPAP says he wears his CPAP every night cannot sleep without it.  CPAP download shows excellent compliance with 100% usage.  Daily average usage at 9 hours.  Patient is on auto CPAP 5 to 8 cm H2O.  AHI 3.4/hour.     Chest Imaging:  Cardiac CT 07/25/19 >> mild fibrotic changes at bases Rt > Lt CT chest 08/24/20 >> UIP pattern High-resolution CT chest January 25, 2023 stable fibrotic changes, consistent with UIP.   Sleep Tests:  HST 10/25/20 >> AHI 23.4, SpO2  low 78% Auto CPAP 08/26/21 to 09/24/21 >> used on 27 of 30 nights with average 8 hrs 46 min.  Average AHI 5.5 with median CPAP 7 and 95 th percentile CPAP 11 cm H2O  OV 05/18/2023  Subjective:  Patient ID: Vincent Meyers, male , DOB: 1945/12/14 , age 63 y.o. , MRN: [id number] , ADDRESS: [address] [place] [address] PCP [name], Raymond J, MD Patient Care Team: [name], [name], MD as PCP - General (Family Medicine) Thurmon Fair, MD as PCP - Cardiology (Cardiology)  This Provider for this visit: Treatment Team:  Attending Provider: Kalman Shan, MD   05/18/2023 -   Chief Complaint  Patient presents with   Follow-up    F/up on ILD     HPI Vincent Meyers 77 y.o. 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  After my last visit he did see nurse practitioner in April 2024.  He also saw Dr. Amada Jupiter and his findings are summarized above.  He is currently on full dose pirfenidone.  His last liver function test was in April 2024.  He needs another liver function test today.  He tells me overall he is stable.  In the interim he did take his [place] motor bikes in 1966 [place] [employer] for display in [place].  He feels he is doing stable.  However his main concern is that his fatigue is more with pirfenidone.  In addition is also gained 7 pounds of weight.  He says the medicine is making him eat more.  He is actually taking more refined carbohydrates.  He says he loves nuts and is agreed to increase his nut intake.   He wanted know how the medication works.  I did tell him it was preventative.  His symptom scores below.  We did a sit/stand exercise hypoxemia test and it was positive.  He did desaturate to 82%.  He says at home he does drop pulse ox.  At night he is not using oxygen he does not have portable system with him.  He is open to the idea of having oxygen with him.  Other than that: He wants to roll over to the big pill pirfenidone.  I said we could do that. Esbriet/Pirfenidone requires intensive drug monitoring due to high concerns for Adverse effects of , including  Drug Induced Liver Injury, significant GI side effects that include but not limited to Diarrhea, Nausea, Vomiting,  and other system side effects that include Fatigue, headaches, weight loss and other side effects such as skin rash. These will be monitored with  blood work such as LFT initially once a month for 6 months and then quarterly      OV 07/04/2023  Subjective:  Patient ID: Vincent Meyers, male , DOB: 13-Apr-1946 , age 52 y.o. , MRN: [id number] , ADDRESS: [address] [place] [address] PCP [name], [name], MD Patient Care Team: [name], [name], MD as PCP - General (Family Medicine) Croitoru, Rachelle Hora, MD as PCP - Cardiology (Cardiology)  This Provider for this visit: Treatment Team:  Attending Provider: Kalman Shan, MD   IPF dx 12/20/22: Based on age greater than 65, Caucasian, male gender, type of occupation, previous smoking, UIP and progression this is IPF especially with negative serology.  -Started pirfenidone mid to late February 2024; low-dose protocol in the setting of chronic diarrhea. ->  Full dose by March/April 2024.  - Last HRCT March 2024  GI workup for chronic diarrhea  March 15, 2023:   OSA:   - Auto CPAP 08/26/21 to 09/24/21 >> used on 27 of 30 nights with average 8 hrs 46 min.  Average AHI 5.5 with median CPAP 7 and 95 th percentile CPAP 11 cm    Esbriet/Pirfenidone requires  intensive drug monitoring due to high concerns for Adverse effects of , including  Drug Induced Liver Injury, significant GI side effects that include but not limited to Diarrhea, Nausea, Vomiting,  and other system side effects that include Fatigue, headaches, weight loss and other side effects such as skin rash. These will be monitored with  blood work such as LFT initially once a month for 6 months and then quarterly  07/04/2023 -   Chief Complaint  Patient presents with   Follow-up    F/up on oxygen.     HPI Vincent Meyers 77 y.o. -returns for follow-up.  This follow-up is earlier than usual.  After the last visit we did a overnight pulse oximetry.  He desaturated but he declined nighttime oxygen.  He wants portable daytime oxygen.  He did desaturate with a sit/stand test last time but apparently the DME company would not given portable oxygen because they wanted a correction test.  In addition I believe they might even need a walking test.  He tells me that he only desaturated with a sit/stand test and is worried he might not desaturate with a walking test.  Otherwise he feels stable.  He is now on 1 big pill pirfenidone a day.    Vincent Meyers had ONO 05/31/23: <= 88% for 1h 21 min   Last liver function test was in June 2024 and normal.  Other issues - She wanted know what RSV vaccine I recommended this to him - She inquired about clinical trials.  Somebody and his family told him about and experimental medication.  I went over some key concepts about clinical trials.  Did indicate to him the primary purpose is for drug development.  Did not indicate chance of getting placebo.  Did indicate voluntary nature.  Did indicate need to follow-up.  He is very interested.  Told him we will put him on the list.  SYMPTOM SCALE - ILD 12/20/2022 02/01/2023 ESBIRET X 2 WEEKS 05/18/2023 212 # 07/04/2023 214#  Current weight      O2 use 0-uses CPAP at night but without oxygen.     Shortness of Breath  0 -> 5 scale with 5 being worst (score 6 If unable to do)     At rest 1 0 0 0  Simple tasks - showers, clothes change, eating, shaving 2 1 2 1   Household (dishes, doing bed, laundry) 3 3 2 2   Shopping 2 1 2 2   Walking level at own pace 2 2 2 2   Walking up Stairs 4 4 4 4   Total (30-36) Dyspnea Score 14 11 12 11       Non-dyspnea symptoms (0-> 5 scale) 12/20/2022 02/01/2023  05/18/2023  07/04/2023   How bad is your cough? 1 NONE 0 0  How bad is your fatigue 2 MILD-MOD 4 3 - due to esbrier per him  How bad is nausea 0 NONE 0 0  How bad is vomiting?  0 NONE 0 0  How bad is diarrhea? 3 BAD AND WORSE AFTER ESBRIET 2 3 baseline per him  How bad is anxiety? 0 OK 1 1  How bad is depression 1 NONE 1 1  Any chronic pain - if  so where and how bad 2   x    Simple office walk 224 (66+46 x 2) feet Pod A at [facility] x  3 laps goal with forehead probe 05/18/2023  07/04/2023   O2 used ra ra  Number laps completed Sit stand x 10 times Desats at 2nd lap  Comments about pace good   Resting Pulse Ox/HR 95% and 78/min 99% aNd h r 66  Final Pulse Ox/HR 92-87% and 84/min 88% and HR 93  Desaturated </= 88% yes   Desaturated <= 3% points yes   Got Tachycardic >/= 90/min no   Symptoms at end of test Dyspnea 5/10   Miscellaneous comments x Crrorected with 2L [place]    PFT     Latest Ref Rng & Units 02/01/2023    8:57 AM 11/26/2021    2:58 PM 11/17/2020    2:54 PM  ILD indicators  FVC-Pre L 2.84  3.27  P 3.28   FVC-Predicted Pre % 74  83  P 82   FVC-Post L 2.87  3.31  P 3.16   FVC-Predicted Post % 75  83  P 79   TLC L 4.50  4.78  P 4.36   TLC Predicted % 68  72  P 65   DLCO uncorrected ml/min/mmHg 14.61  15.89  P 16.02   DLCO UNC %Pred % 62  66  P 67   DLCO Corrected ml/min/mmHg 14.61  15.89  P 16.02   DLCO COR %Pred % 62  66  P 67     P Preliminary result      LAB RESULTS last 96 hours No results found.  LAB RESULTS last 90 days Recent Results (from the past 2160 hour(s))  Microalbumin /  creatinine urine ratio     Status: None   Collection Time: 04/26/23 11:39 AM  Result Value Ref Range   Creatinine, Urine 31.4 Not Estab. mg/dL   Microalbumin, Urine <1.6 Not Estab. ug/mL   Microalb/Creat Ratio <10 0 - 29 mg/g creat    Comment:                        Normal:                0 -  29                        Moderately increased: 30 - 300                        Severely increased:       >300   Hemoglobin A1c     Status: Abnormal   Collection Time: 04/26/23 11:39 AM  Result Value Ref Range   Hgb A1c MFr Bld 6.9 (H) 4.8 - 5.6 %    Comment:          Prediabetes: 5.7 - 6.4          Diabetes: >6.4          Glycemic control for adults with diabetes: <7.0    Est. average glucose Bld gHb Est-mCnc 151 mg/dL  Hepatic function panel     Status: None   Collection Time: 05/18/23 10:05 AM  Result Value Ref Range   Total Bilirubin 0.6 0.2 - 1.2 mg/dL   Bilirubin, Direct 0.2 0.0 - 0.3 mg/dL   Alkaline Phosphatase 59 39 - 117 U/L   AST 25 0 - 37 U/L  ALT 22 0 - 53 U/L   Total Protein 7.1 6.0 - 8.3 g/dL   Albumin 4.2 3.5 - 5.2 g/dL          has a past medical history of Angina pectoris (HCC), Anxiety, Arthritis, Diabetes mellitus without complication (HCC), Erectile dysfunction, Hyperlipidemia, Hypertension, IBS (irritable bowel syndrome), ILD (interstitial lung disease) (HCC), and OSA (obstructive sleep apnea).   reports that he quit smoking about 35 years ago. His smoking use included cigarettes. He started smoking about 58 years ago. He has a 46 pack-year smoking history. He has never used smokeless tobacco.  Past Surgical History:  Procedure Laterality Date   APPENDECTOMY     COLON SURGERY     perforated bowel and hernia repair   ILIAC ARTERY ANEURYSM REPAIR Left 2006   Dr. Arbie Cookey (redo surgery)   REPLACEMENT TOTAL KNEE BILATERAL     TONSILLECTOMY     age 51    No Known Allergies  Immunization History  Administered Date(s) Administered   Fluad Quad(high Dose 65+)  08/23/2022   Influenza Split 09/19/2015   Influenza, High Dose Seasonal PF 10/24/2019   Influenza, Quadrivalent, Recombinant, Inj, Pf 08/22/2017   Influenza,inj,Quad PF,6+ Mos 10/11/2016, 10/24/2018   Influenza,inj,Quad PF,6-35 Mos 10/11/2016, 10/24/2018   Influenza,inj,quad, With Preservative 09/19/2015   Influenza,trivalent, recombinat, inj, PF 08/22/2017   PFIZER(Purple Top)SARS-COV-2 Vaccination 01/06/2020, 01/29/2020   Pneumococcal Conjugate-13 05/19/2016   Pneumococcal Polysaccharide-23 09/05/2012   Tdap 03/12/2015   Unspecified SARS-COV-2 Vaccination 08/16/2022   Zoster, Live 11/05/2013    Family History  Problem Relation Age of Onset   Parkinson's disease Mother    Heart attack Father    Stroke Father    Alzheimer's disease Father    Alzheimer's disease Sister    Other Sister        degenerative muscle disease   Hypertension Daughter    Obesity Daughter    Heart murmur Daughter    Colon cancer Neg Hx    Esophageal cancer Neg Hx    Rectal cancer Neg Hx    Stomach cancer Neg Hx      Current Outpatient Medications:    albuterol (VENTOLIN HFA) 108 (90 Base) MCG/ACT inhaler, Inhale 1-2 puffs into the lungs every 6 (six) hours as needed., Disp: 8 g, Rfl: 2   amLODipine (NORVASC) 5 MG tablet, TAKE 1 TABLET (5 MG TOTAL) BY MOUTH DAILY., Disp: 30 tablet, Rfl: 4   aspirin EC 81 MG tablet, Take by mouth., Disp: , Rfl:    atenolol (TENORMIN) 50 MG tablet, TAKE 1 AND 1/2 TABLETS DAILY BY MOUTH, Disp: 135 tablet, Rfl: 1   Flaxseed, Linseed, (FLAXSEED OIL PO), Take by mouth., Disp: , Rfl:    fluticasone (FLONASE) 50 MCG/ACT nasal spray, Place 2 sprays into both nostrils daily., Disp: , Rfl:    hydrochlorothiazide (HYDRODIURIL) 25 MG tablet, TAKE 1 TABLET BY MOUTH EVERY DAY, Disp: 90 tablet, Rfl: 0   lisinopril (ZESTRIL) 40 MG tablet, TAKE 1 TABLET BY MOUTH EVERY DAY, Disp: 90 tablet, Rfl: 1   Magnesium Gluconate 550 MG TABS, Take by mouth., Disp: , Rfl:    omeprazole (PRILOSEC)  20 MG capsule, TAKE 1 CAPSULE BY MOUTH EVERY DAY, Disp: 90 capsule, Rfl: 1   Pirfenidone 801 MG TABS, Take 1 tablet (801 mg total) by mouth 3 (three) times daily with meals., Disp: 270 tablet, Rfl: 5   sildenafil (REVATIO) 20 MG tablet, Take 1 tablet (20 mg total) by mouth daily as needed., Disp: 30 tablet, Rfl:  0   simvastatin (ZOCOR) 40 MG tablet, TAKE 1 TABLET(40 MG) BY MOUTH EVERY NIGHT, Disp: 90 tablet, Rfl: 1   Turmeric (QC TUMERIC COMPLEX PO), Take by mouth., Disp: , Rfl:    famotidine (PEPCID) 40 MG tablet, TAKE 1 TABLET BY MOUTH EVERY DAY (Patient not taking: Reported on 06/08/2023), Disp: 90 tablet, Rfl: 1   Na Sulfate-K Sulfate-Mg Sulf 17.5-3.13-1.6 GM/177ML SOLN, Take by mouth as directed. (Patient not taking: Reported on 06/08/2023), Disp: , Rfl:    Probiotic Product (PROBIOTIC-10 PO), Take by mouth. (Patient not taking: Reported on 06/08/2023), Disp: , Rfl:       Objective:   Vitals:   07/04/23 1311  BP: 130/80  Pulse: 67  SpO2: 95%  Weight: 214 lb 9.6 oz (97.3 kg)  Height: 5\' 7"  (1.702 m)    Estimated body mass index is 33.61 kg/m as calculated from the following:   Height as of this encounter: 5\' 7"  (1.702 m).   Weight as of this encounter: 214 lb 9.6 oz (97.3 kg).  @WEIGHTCHANGE @  [employer]   07/04/23 1311  Weight: 214 lb 9.6 oz (97.3 kg)     Physical Exam   General: No distress. Looks well O2 at rest: no Cane present: no Sitting in wheel chair: no Frail: no Obese: no Neuro: Alert and Oriented x 3. GCS 15. Speech normal Psych: Pleasant Resp:  Barrel Chest - no.  Wheeze - no, Crackles - yes base, No overt respiratory distress CVS: Normal heart sounds. Murmurs - no Ext: Stigmata of Connective Tissue Disease - no HEENT: Normal upper airway. PEERL +. No post nasal drip        Assessment:       ICD-10-CM   1. IPF (idiopathic pulmonary fibrosis) (HCC)  J84.112     2. ILD (interstitial lung disease) (HCC)  J84.9 Pulmonary function test    3.  Encounter for therapeutic drug monitoring  Z51.81     4. Exercise hypoxemia  R09.02          Plan:     Patient Instructions     ICD-10-CM   1. ILD (interstitial lung disease) (HCC)  J84.9     2. Encounter for therapeutic drug monitoring  Z51.81     3. Exercise hypoxemia  R09.02         Pulmonary fibrosis is been slowly progressive between 2020 and 2024.  There is at least a greater than 10% decline in pulmonary function test between 2023 and March 2024.  Glad you are on pirfenidone since early February 2024 and currently on full dose and tolerating it well.  LFT nomral 05/18/23  You qualify for 2L [place] day exercise o2   Plan -Continue full dose protocol  801 mg/day [large pill] at 1 pill 3 times daily  -Take it with food  -Apply sunscreen  -Space medications at least 5-6 hours apart -Combat weight gain  -Eat more nuts and plain [place] yogurt  -Avoid simple carbohydrates and refined sugars ---Start 2 L oxygen portable system -Do spirometry and DLCO in 2  months -Consider clinical trials in the future    Have referred you  Follow-up - Return in 8- weeks to see see Dr. Marchelle Gearing 30-minute visit-    FOLLOWUP Return in about 8 weeks (around 08/29/2023) for 30 min visit, ILD, with Dr Marchelle Gearing, Face to Face Visit, after Cleda Daub and DLCO.    SIGNATURE    Dr. Kalman Shan, M.D., F.C.C.P,  Pulmonary and Critical Care Medicine Staff Physician,  [facility] Center Director - Interstitial Lung Disease  Program  Pulmonary Fibrosis [facility] [place], [place], [address]  Pager: [phone], If no answer or between  15:00h - 7:00h: call [phone] Telephone: [phone]  1:49 PM 07/04/2023

## 2023-07-13 ENCOUNTER — Other Ambulatory Visit (HOSPITAL_COMMUNITY): Payer: Self-pay

## 2023-07-14 ENCOUNTER — Ambulatory Visit: Payer: Medicare HMO | Admitting: Internal Medicine

## 2023-07-22 ENCOUNTER — Other Ambulatory Visit (HOSPITAL_BASED_OUTPATIENT_CLINIC_OR_DEPARTMENT_OTHER): Payer: Self-pay | Admitting: Family Medicine

## 2023-07-22 DIAGNOSIS — K219 Gastro-esophageal reflux disease without esophagitis: Secondary | ICD-10-CM

## 2023-07-29 ENCOUNTER — Ambulatory Visit (HOSPITAL_BASED_OUTPATIENT_CLINIC_OR_DEPARTMENT_OTHER): Payer: Medicare HMO | Admitting: Family Medicine

## 2023-07-29 ENCOUNTER — Encounter (HOSPITAL_BASED_OUTPATIENT_CLINIC_OR_DEPARTMENT_OTHER): Payer: Self-pay | Admitting: Family Medicine

## 2023-07-29 VITALS — BP 157/76 | HR 73 | Ht 67.0 in | Wt 212.2 lb

## 2023-07-29 DIAGNOSIS — E1159 Type 2 diabetes mellitus with other circulatory complications: Secondary | ICD-10-CM | POA: Diagnosis not present

## 2023-07-29 DIAGNOSIS — I10 Essential (primary) hypertension: Secondary | ICD-10-CM | POA: Diagnosis not present

## 2023-07-29 DIAGNOSIS — N529 Male erectile dysfunction, unspecified: Secondary | ICD-10-CM

## 2023-07-29 DIAGNOSIS — Z23 Encounter for immunization: Secondary | ICD-10-CM

## 2023-07-29 LAB — POCT GLYCOSYLATED HEMOGLOBIN (HGB A1C): Hemoglobin A1C: 7 % — AB (ref 4.0–5.6)

## 2023-07-29 NOTE — Assessment & Plan Note (Signed)
Chronic issue for patient Reports taking sildenafil currently - wonders about other treatment options. Indicates that he is taking 20 mg dose, often taking 2 pills as needed He feels that despite use of sildenafil he will still have ED issues at times This issue for patient is not as much of a priority as compared to above problems Discussed possibility of evaluation with urology, he is amenable to referral, placed today

## 2023-07-29 NOTE — Assessment & Plan Note (Addendum)
Most recent hemoglobin A1c remains stable, was still appropriate given patient age and A1c target.  Patient has primarily managed blood sugars without any medications in the past, primarily focusing on lifestyle modifications We will recheck hemoglobin A1c today for monitoring.  Did discuss considerations related to recent hemoglobin A1c elevation and if A1c continues to be elevated or further elevated.  We did discuss medication considerations such as metformin or possible GLP-1 receptor agonist.  Potentially be amenable to starting metformin to assist with controlling blood sugars.  We did discuss potential risks and side effects related to medication. Nephropathy screening completed at last office visit.

## 2023-07-29 NOTE — Progress Notes (Signed)
    Procedures performed today:    None.  Independent interpretation of notes and tests performed by another provider:   None.  Brief History, Exam, Impression, and Recommendations:    BP (!) 157/76   Pulse 73   Ht 5\' 7"  (1.702 m)   Wt 212 lb 3.2 oz (96.3 kg)   SpO2 96%   BMI 33.24 kg/m   Type 2 diabetes mellitus with other circulatory complication, without long-term current use of insulin (HCC) Assessment & Plan: Most recent hemoglobin A1c remains stable, was still appropriate given patient age and A1c target.  Patient has primarily managed blood sugars without any medications in the past, primarily focusing on lifestyle modifications We will recheck hemoglobin A1c today for monitoring.  Did discuss considerations related to recent hemoglobin A1c elevation and if A1c continues to be elevated or further elevated.  We did discuss medication considerations such as metformin or possible GLP-1 receptor agonist.  Potentially be amenable to starting metformin to assist with controlling blood sugars.  We did discuss potential risks and side effects related to medication. Nephropathy screening completed at last office visit.  Orders: -     POCT glycosylated hemoglobin (Hb A1C)  Essential hypertension Assessment & Plan: Blood pressure elevated in office, systolic above goal, diastolic at goal.  He checks blood pressure occasionally at home, has been borderline elevated when he does check his.  It was also elevated at recent visit to dentist.  He continues with antihypertensive medications as prescribed.  Last appointment with cardiology was about 2 months ago, blood pressure was controlled in their office at that time For now, we can continue with current medication regimen, no changes today.  Recommend intermittent monitoring of blood pressure at home, DASH diet.   Erectile dysfunction, unspecified erectile dysfunction type Assessment & Plan: Chronic issue for patient Reports taking  sildenafil currently - wonders about other treatment options. Indicates that he is taking 20 mg dose, often taking 2 pills as needed He feels that despite use of sildenafil he will still have ED issues at times This issue for patient is not as much of a priority as compared to above problems Discussed possibility of evaluation with urology, he is amenable to referral, placed today  Orders: -     Ambulatory referral to Urology  Need for influenza vaccination  Encounter for immunization -     Flu Vaccine Trivalent High Dose (Fluad)  Return in about 3 months (around 10/28/2023) for hypertension, diabetes.   ___________________________________________ Keylee Shrestha [name], MD, ABFM, CAQSM Primary Care and Sports Medicine [facility]

## 2023-07-29 NOTE — Assessment & Plan Note (Signed)
Blood pressure elevated in office, systolic above goal, diastolic at goal.  He checks blood pressure occasionally at home, has been borderline elevated when he does check his.  It was also elevated at recent visit to dentist.  He continues with antihypertensive medications as prescribed.  Last appointment with cardiology was about 2 months ago, blood pressure was controlled in their office at that time For now, we can continue with current medication regimen, no changes today.  Recommend intermittent monitoring of blood pressure at home, DASH diet.

## 2023-08-01 ENCOUNTER — Encounter: Payer: Self-pay | Admitting: Internal Medicine

## 2023-08-01 DIAGNOSIS — J849 Interstitial pulmonary disease, unspecified: Secondary | ICD-10-CM

## 2023-08-03 NOTE — Telephone Encounter (Signed)
He needs portable o2 guys

## 2023-08-04 ENCOUNTER — Other Ambulatory Visit (HOSPITAL_COMMUNITY): Payer: Self-pay

## 2023-08-04 NOTE — Telephone Encounter (Signed)
PT now calling to tell us he did not get the POC he needed. Please resend correct RX to [employer].   Please call PT to advise action taken and a time frame. His #  is [phone]

## 2023-08-08 NOTE — Telephone Encounter (Signed)
Order replaced, with oxygen stats included in the order.

## 2023-08-10 ENCOUNTER — Other Ambulatory Visit: Payer: Self-pay

## 2023-08-12 ENCOUNTER — Other Ambulatory Visit (HOSPITAL_BASED_OUTPATIENT_CLINIC_OR_DEPARTMENT_OTHER): Payer: Self-pay | Admitting: Family Medicine

## 2023-08-12 DIAGNOSIS — E782 Mixed hyperlipidemia: Secondary | ICD-10-CM

## 2023-08-13 IMAGING — CT CT CHEST HIGH RESOLUTION
2 of 7 series · 14 of 36 positions shown, 17 images · non-contrast
Comparison: Cardiac CT 07/24/2019.

CLINICAL DATA: 75-year-old male with history of pulmonary fibrosis.
Follow-up study.

EXAM:
CT CHEST WITHOUT CONTRAST
TECHNIQUE: Multidetector CT imaging of the chest was performed following the
standard protocol without intravenous contrast. High resolution
imaging of the lungs, as well as inspiratory and expiratory imaging,
was performed.

[Series 4: high resolution · axial · 0.80mm/px · z∈[-272,-26]mm · 11 of 296 slices shown, 14 images]
[im 25/296  mediastinal]
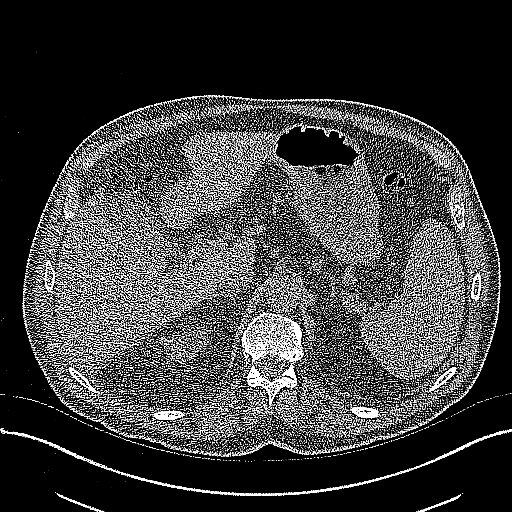
[im 25/296  lung]
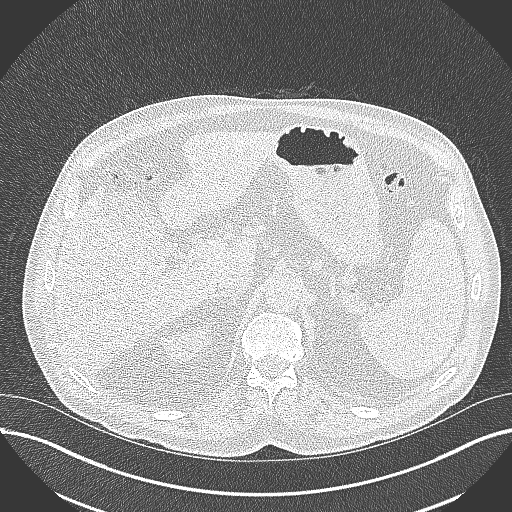
[im 50/296  lung]
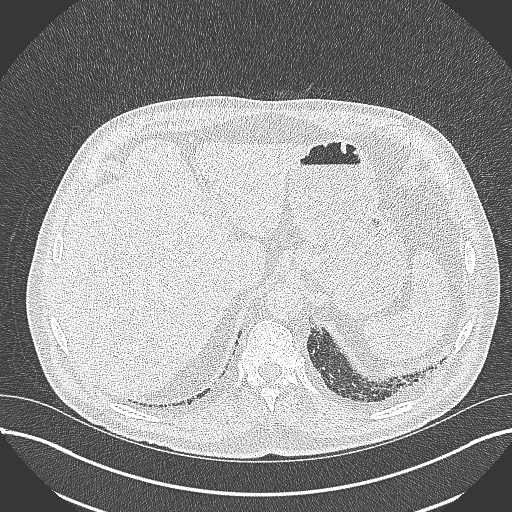
[im 74/296  lung]
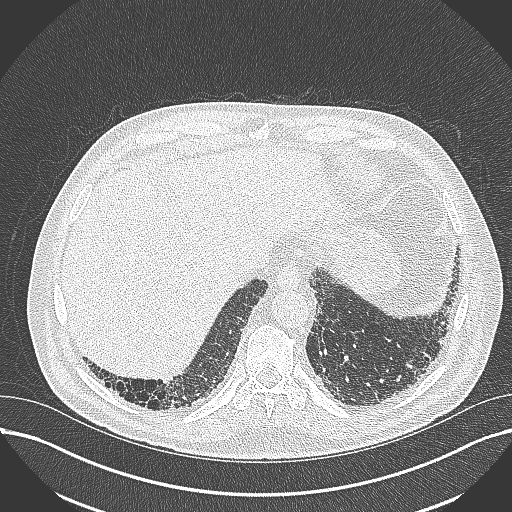
[im 99/296  lung]
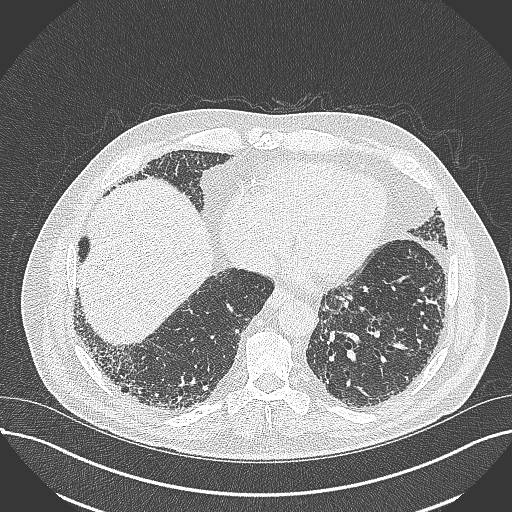
[im 123/296  mediastinal]
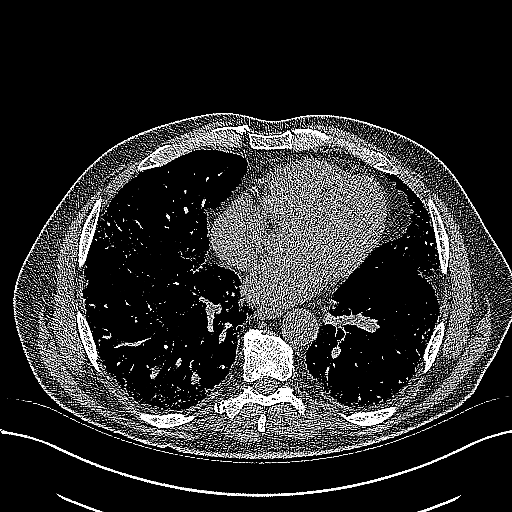
[im 123/296  lung]
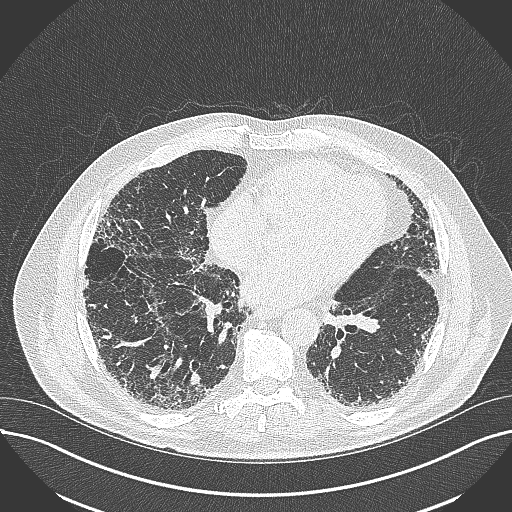
[im 148/296  lung]
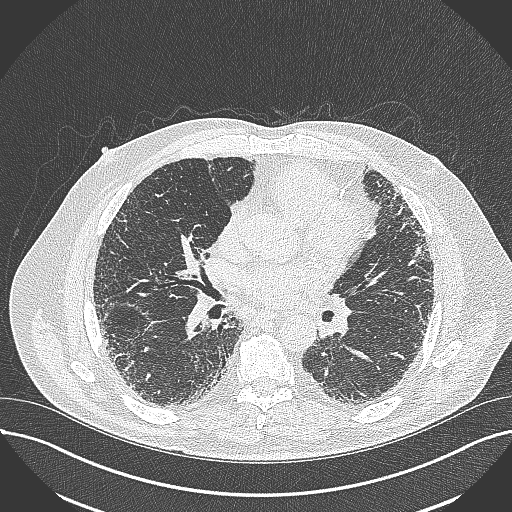
[im 173/296  lung]
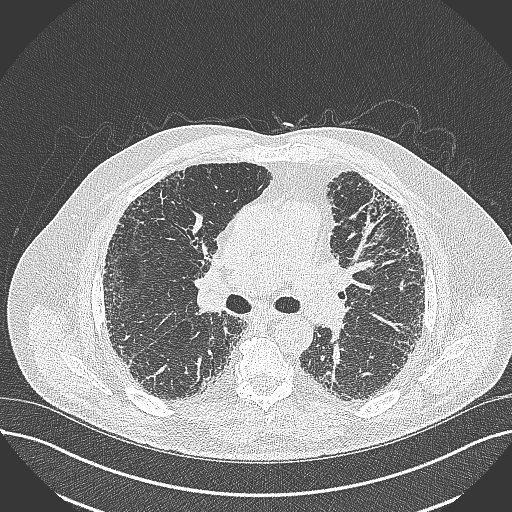
[im 197/296  lung]
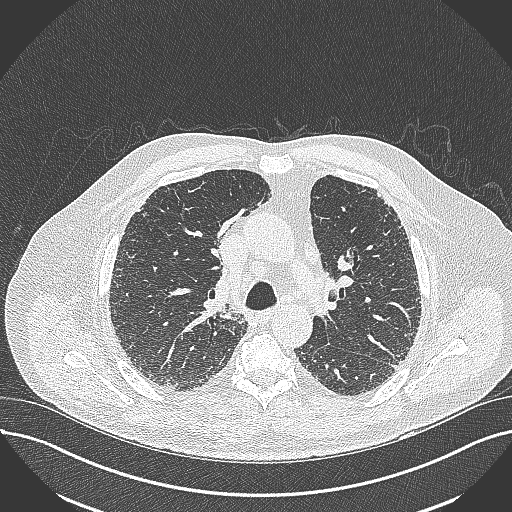
[im 222/296  mediastinal]
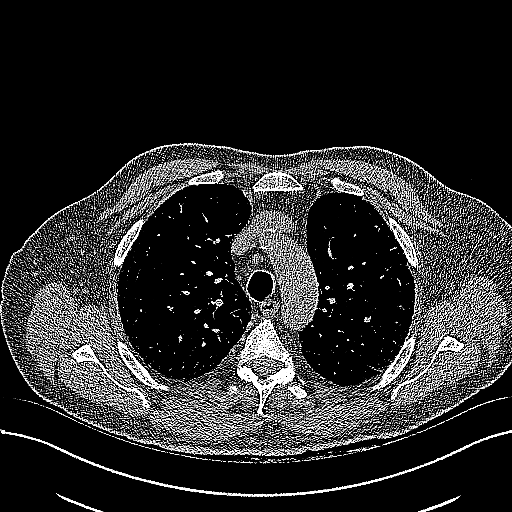
[im 222/296  lung]
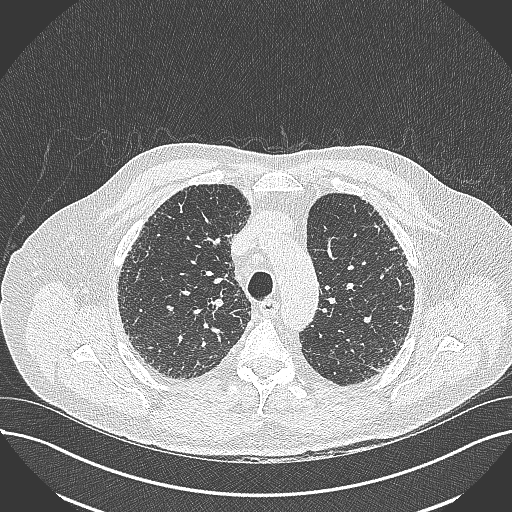
[im 246/296  lung]
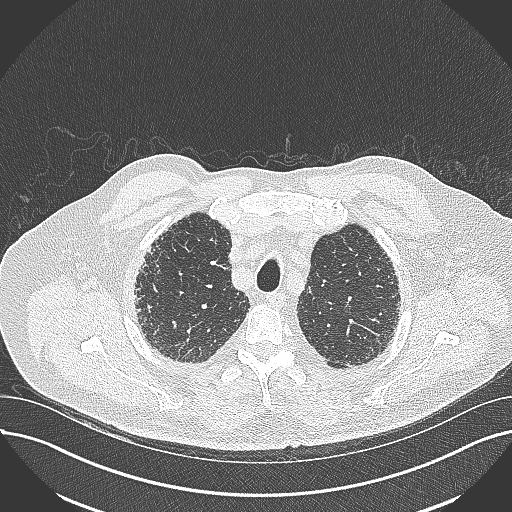
[im 271/296  lung]
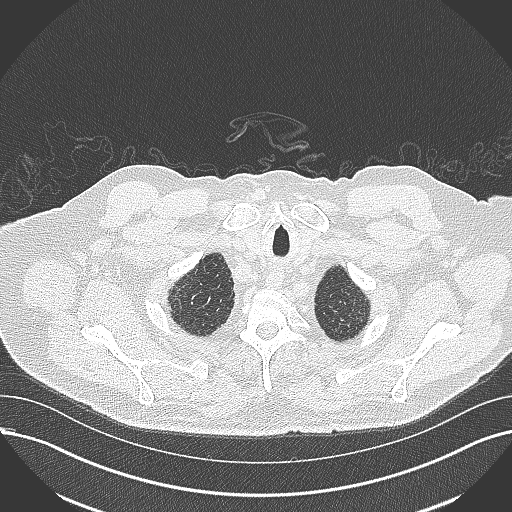

[Series 7: coronal · coronal · 0.59mm/px · 3 of 131 slices shown]
[im 27/131  lung]
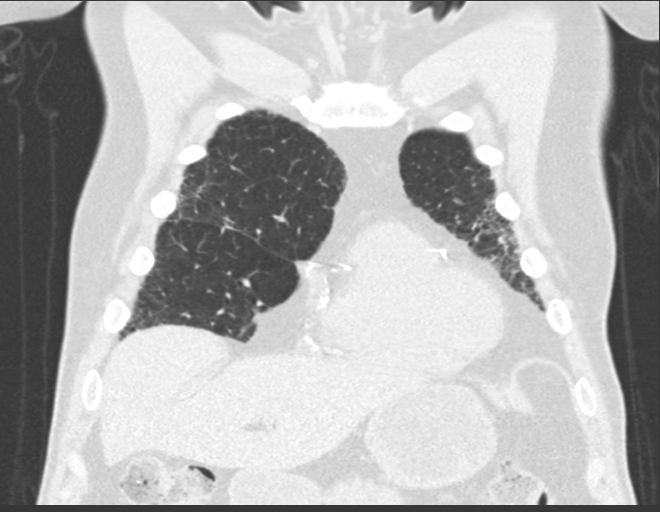
[im 53/131  lung]
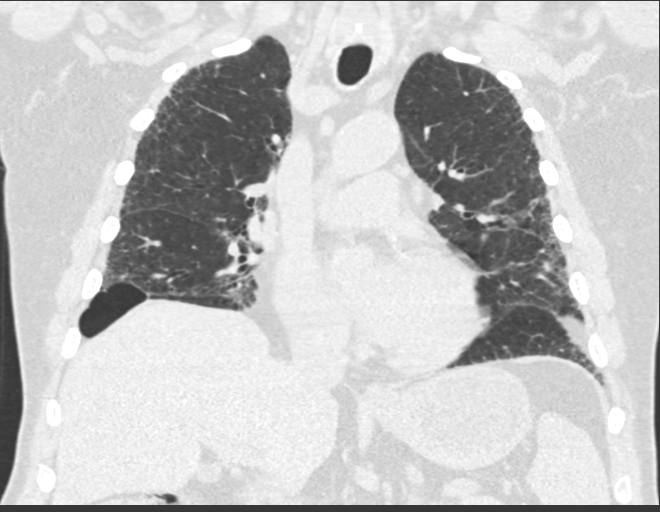
[im 79/131  lung]
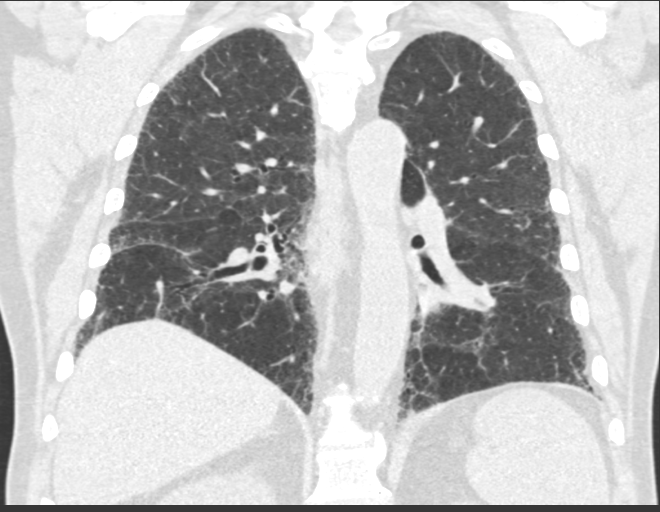

[14 of 36 positions shown; findings below may reference images not displayed]

FINDINGS: Cardiovascular: Heart size is normal. There is no significant
pericardial fluid, thickening or pericardial calcification. There is
aortic atherosclerosis, as well as atherosclerosis of the great
vessels of the mediastinum and the coronary arteries, including
calcified atherosclerotic plaque in the left main, left anterior
descending, left circumflex and right coronary arteries.

Mediastinum/Nodes: No pathologically enlarged mediastinal or hilar
lymph nodes. Multiple prominent but nonenlarged mediastinal and
hilar lymph nodes are incidentally noted. Esophagus is unremarkable
in appearance. No axillary lymphadenopathy.

Lungs/Pleura: High-resolution images demonstrate widespread but
patchy areas of ground-glass attenuation, septal thickening,
subpleural reticulation, parenchymal banding, traction
bronchiectasis and frank honeycombing. Findings have a definitive
craniocaudal gradient and are clearly progressive compared to the
prior examination from 4848. Inspiratory and expiratory imaging is
unremarkable. No acute consolidative airspace disease. No pleural
effusions. No suspicious appearing pulmonary nodules or masses are
confidently identified upon this background of fibrotic lung
disease.

Upper Abdomen: Aortic atherosclerosis.

Musculoskeletal: There are no aggressive appearing lytic or blastic
lesions noted in the visualized portions of the skeleton.
IMPRESSION: 1. The appearance of the lungs is considered diagnostic of usual
interstitial pneumonia (UIP) per current ATS guidelines, with clear
progression compared to the prior examination, as discussed above.
2. Aortic atherosclerosis, in addition to left main and 3 vessel
coronary artery disease. Please note that although the presence of
coronary artery calcium documents the presence of coronary artery
disease, the severity of this disease and any potential stenosis
cannot be assessed on this non-gated CT examination. Assessment for
potential risk factor modification, dietary therapy or pharmacologic
therapy may be warranted, if clinically indicated.

Aortic Atherosclerosis ([id number]-DE0.0).

## 2023-08-22 ENCOUNTER — Ambulatory Visit: Payer: Medicare HMO | Admitting: Urology

## 2023-08-22 ENCOUNTER — Encounter: Payer: Self-pay | Admitting: Urology

## 2023-08-22 ENCOUNTER — Other Ambulatory Visit: Payer: Self-pay | Admitting: Pulmonary Disease

## 2023-08-22 VITALS — BP 179/80 | HR 75 | Ht 67.0 in | Wt 212.0 lb

## 2023-08-22 DIAGNOSIS — J84112 Idiopathic pulmonary fibrosis: Secondary | ICD-10-CM

## 2023-08-22 DIAGNOSIS — N138 Other obstructive and reflux uropathy: Secondary | ICD-10-CM

## 2023-08-22 DIAGNOSIS — Z006 Encounter for examination for normal comparison and control in clinical research program: Secondary | ICD-10-CM

## 2023-08-22 DIAGNOSIS — N401 Enlarged prostate with lower urinary tract symptoms: Secondary | ICD-10-CM

## 2023-08-22 DIAGNOSIS — N529 Male erectile dysfunction, unspecified: Secondary | ICD-10-CM

## 2023-08-22 LAB — BLADDER SCAN AMB NON-IMAGING

## 2023-08-22 MED ORDER — TADALAFIL 20 MG PO TABS
20.0000 mg | ORAL_TABLET | Freq: Every day | ORAL | 11 refills | Status: AC | PRN
Start: 2023-08-22 — End: ?

## 2023-08-22 MED ORDER — ALFUZOSIN HCL ER 10 MG PO TB24
10.0000 mg | ORAL_TABLET | Freq: Every day | ORAL | 11 refills | Status: DC
Start: 2023-08-22 — End: 2024-01-18

## 2023-08-22 NOTE — Progress Notes (Signed)
Assessment: 1. BPH with obstruction/lower urinary tract symptoms   2. Organic impotence     Plan: I personally reviewed the patient's chart including provider notes. PSA today Diagnosis and management of BPH with lower urinary tract symptoms discussed.  Options for management including medical therapy with alpha blockers, combination therapy with addition of 5 alpha reductase inhibitor, minimally invasive surgery, and surgical management reviewed. Trial of therapy with alfuzosin 10 mg daily. Rx sent.  Use and side effects discussed.  Today I had a discussion with the patient spending a total of 10 minutes discussing ED.  I discussed the pathophysiology, etiology, and natural history of ED as well as management options using a goal-oriented approach.  We discussed the following options: Medical therapy, vacuum erection device, penile injections, intraurethral suppository therapy (MUSE), and penile prosthesis. Patient educational materials concerning these options were given to the patient.  Trial of tadalafil 10-20 mg prn intercourse.  Rx sent. Return to office in 1 month.  Chief Complaint:  Chief Complaint  Patient presents with   Erectile Dysfunction    History of Present Illness:  Vincent Meyers is a 77 y.o. male who is seen in consultation from [name], [name], MD for evaluation of lower urinary tract symptoms and erectile dysfunction.  He has a 10-year history of lower urinary tract symptoms with some gradual worsening.  He reports time frequency voiding every 2 hours, nocturia x 4, hesitancy, intermittent stream, weak stream, and postvoid dribbling.  No dysuria or gross hematuria. IPSS = 23 QOL = 4 today. He was previously on tadalafil 5 mg daily but did not see any significant improvement in his urinary symptoms.  No other medical therapy. PSA from 7/21: 1.52  He also reports a several year history of erectile dysfunction.  He is able to achieve a partial erection but has  rapid detumescence.  No pain or curvature with erections.  No decrease in his libido.  He did not see any significant improvement in his erectile dysfunction while on the daily tadalafil.  He is currently using sildenafil 20 mg as needed.  This does improve his erections but he reports significant flushing after taking the medication.  This last for approximately 24 hours.  He is interested in other options for management.    Past Medical History:  Past Medical History:  Diagnosis Date   Acid reflux    Angina pectoris (HCC)    Anxiety    Arthritis    Coronary artery disease    Erectile dysfunction    Hyperlipidemia    Hypertension    IBS (irritable bowel syndrome)    ILD (interstitial lung disease) (HCC)    OSA (obstructive sleep apnea)     Past Surgical History:  Past Surgical History:  Procedure Laterality Date   APPENDECTOMY     COLON SURGERY     perforated bowel and hernia repair   ILIAC ARTERY ANEURYSM REPAIR Left 2006   Dr. Arbie Cookey (redo surgery)   REPLACEMENT TOTAL KNEE BILATERAL     TONSILLECTOMY     age 71    Allergies:  No Known Allergies  Family History:  Family History  Problem Relation Age of Onset   Parkinson's disease Mother    Heart attack Father    Stroke Father    Alzheimer's disease Father    Alzheimer's disease Sister    Other Sister        degenerative muscle disease   Hypertension Daughter    Obesity Daughter  Heart murmur Daughter    Colon cancer Neg Hx    Esophageal cancer Neg Hx    Rectal cancer Neg Hx    Stomach cancer Neg Hx     Social History:  Social History   Tobacco Use   Smoking status: Former    Current packs/day: 0.00    Average packs/day: 2.0 packs/day for 23.0 years (46.0 ttl pk-yrs)    Types: Cigarettes    Start date: 1966    Quit date: 1989    Years since quitting: 35.7   Smokeless tobacco: Never  Vaping Use   Vaping status: Never Used  Substance Use Topics   Alcohol use: Not Currently    Alcohol/week: 2.0  standard drinks of alcohol    Types: 2 Standard drinks or equivalent per week    Comment: quit 7 months ago stated 12/23/22   Drug use: Never    Review of symptoms:  Constitutional:  Negative for unexplained weight loss, night sweats, fever, chills ENT:  Negative for nose bleeds, sinus pain, painful swallowing CV:  Negative for chest pain, shortness of breath, exercise intolerance, palpitations, loss of consciousness Resp:  Negative for cough, wheezing, shortness of breath GI:  Negative for nausea, vomiting, diarrhea, bloody stools GU:  Positives noted in HPI; otherwise negative for gross hematuria, dysuria Neuro:  Negative for seizures, poor balance, limb weakness, slurred speech Psych:  Negative for lack of energy, depression, anxiety Endocrine:  Negative for polydipsia, polyuria, symptoms of hypoglycemia (dizziness, hunger, sweating) Hematologic:  Negative for anemia, purpura, petechia, prolonged or excessive bleeding, use of anticoagulants  Allergic:  Negative for difficulty breathing or choking as a result of exposure to anything; no shellfish allergy; no allergic response (rash/itch) to materials, foods  Physical exam: BP (!) 179/80   Pulse 75   Ht 5\' 7"  (1.702 m)   Wt 212 lb (96.2 kg)   BMI 33.20 kg/m  GENERAL APPEARANCE:  Well appearing, well developed, well nourished, NAD HEENT: Atraumatic, Normocephalic, oropharynx clear. NECK: Supple without lymphadenopathy or thyromegaly. LUNGS: Clear to auscultation bilaterally. HEART: Regular Rate and Rhythm without murmurs, gallops, or rubs. ABDOMEN: Soft, non-tender, No Masses. EXTREMITIES: Moves all extremities well.  Without clubbing, cyanosis, or edema. NEUROLOGIC:  Alert and oriented x 3, normal gait, CN II-XII grossly intact.  MENTAL STATUS:  Appropriate. BACK:  Non-tender to palpation.  No CVAT SKIN:  Warm, dry and intact.   GU: Penis:  circumcised Meatus: Normal Scrotum: Enlargement of right scrotum consistent with  hydrocele Testis: Right difficult to palpate due to hydrocele; left normal Prostate: 50 g, NT, no nodules Rectum: Normal tone,  no masses or tenderness   Results: U/A: negative  PVR: 187 ml

## 2023-08-23 ENCOUNTER — Encounter: Payer: Self-pay | Admitting: Urology

## 2023-08-23 ENCOUNTER — Other Ambulatory Visit: Payer: Self-pay | Admitting: Pulmonary Disease

## 2023-08-23 DIAGNOSIS — Z006 Encounter for examination for normal comparison and control in clinical research program: Secondary | ICD-10-CM

## 2023-08-23 DIAGNOSIS — J84112 Idiopathic pulmonary fibrosis: Secondary | ICD-10-CM

## 2023-08-23 LAB — URINALYSIS, ROUTINE W REFLEX MICROSCOPIC
Bilirubin, UA: NEGATIVE
Glucose, UA: NEGATIVE
Ketones, UA: NEGATIVE
Leukocytes,UA: NEGATIVE
Nitrite, UA: NEGATIVE
Protein,UA: NEGATIVE
RBC, UA: NEGATIVE
Specific Gravity, UA: 1.015 (ref 1.005–1.030)
Urobilinogen, Ur: 0.2 mg/dL (ref 0.2–1.0)
pH, UA: 6 (ref 5.0–7.5)

## 2023-08-23 LAB — PSA: Prostate Specific Ag, Serum: 3.5 ng/mL (ref 0.0–4.0)

## 2023-08-24 ENCOUNTER — Ambulatory Visit (HOSPITAL_COMMUNITY)
Admission: RE | Admit: 2023-08-24 | Discharge: 2023-08-24 | Disposition: A | Discharge status: Home / Self Care | Payer: Medicare HMO | Source: Ambulatory Visit | Attending: Pulmonary Disease | Admitting: Pulmonary Disease

## 2023-08-24 ENCOUNTER — Encounter: Payer: Medicare HMO | Admitting: Pulmonary Disease

## 2023-08-24 DIAGNOSIS — Z006 Encounter for examination for normal comparison and control in clinical research program: Secondary | ICD-10-CM

## 2023-08-24 DIAGNOSIS — I272 Pulmonary hypertension, unspecified: Secondary | ICD-10-CM

## 2023-08-24 DIAGNOSIS — J84112 Idiopathic pulmonary fibrosis: Secondary | ICD-10-CM

## 2023-08-24 LAB — ECHOCARDIOGRAM COMPLETE
Area-P 1/2: 3.63 cm2
Calc EF: 69.3 %
MV VTI: 4.58 cm2
S' Lateral: 3.1 cm
Single Plane A2C EF: 59.9 %
Single Plane A4C EF: 73.7 %

## 2023-08-24 NOTE — Progress Notes (Signed)
  Echocardiogram 2D Echocardiogram has been performed.  Milda Smart 08/24/2023, 11:07 AM

## 2023-08-24 NOTE — Research (Signed)
[place]:  "PHINDER: Pulmonary Hypertension Screening in Patients with Interstitial Lung Disease for Earlier Detection"  Primary Endpoint  To prospectively evaluate screening strategies for pulmonary hypertension (PH) in patients with ILD in an effort to promote awareness and encourage screening for PH in this patient population. Results from this study will be used to identify and weigh specific clinical parameters based on their prognostic significance for right heart catheterization (RHC)-confirmed PH in patients with ILD.  Study Code: GMS-PH-001 Protocol Edition: Amendment 1 dated 15 Feb 2022 Sponsor Name: [employer] Primary Investigator: Dr. Susy Frizzle Hunsucker  Protocol Version for 08/24/2023 date of 15 Feb 2022  Consent Version for 08/24/2023 date of 05 Jul 2022   Lab Manual: v1 dated 29 Jan 2022   Key Inclusion Criteria:  Patients are eligible if they have a diagnosis of ILD based on computed tomography imaging, including:  Idiopathic interstitial pneumonia, including idiopathic pulmonary fibrosis  Connective tissue disease-associated ILD with forced vital capacity (FVC) <70%  Hypersensitivity pneumonitis  Scleroderma-related ILD  Autoimmune ILD  Nonspecific interstitial pneumonia  Occupational lung disease  Combined pulmonary fibrosis and emphysema  Additionally, patients must meet a total of 2 or more criteria from at least 2 distinct categories below (ie, Category 1, Category 2, Category 3) based on the Investigator's clinical judgment, within 180 days of Screening.  Category 1: Clinical tests: a) Pulmonary function tests (PFTs)  meeting specified study guidelines for DLCO. b) Historical HRCT with specified findings consistent with increased pulmonary arterial pressure.  Category 2: Symptoms, oxygenation, exercise capacity, and brain natriuretic peptide/N-terminal pro-brain natriuretic peptide indicative of worsening status. Category 3:  Echocardiography Specified findings of increased RV systolic pressure or RV enlargement.  If a patient does not have a historical PFT, HRCT, or echocardiogram needed to assess eligibility for the study, these procedures will be performed as part of study screening.  Key Exclusion Criteria:  Patients are excluded if any of the following criteria apply:  Prior RHC with mean pulmonary artery pressure >20 mmHg  Currently on an FDA-approved pulmonary arterial hypertension medication  Diagnosed with chronic obstructive pulmonary disease  Uncontrolled or untreated sleep apnea  Pulmonary embolism within the past 3 months  History of ischemic heart disease or left-sided myocardial dysfunction within 12 months of Screening, defined as left ventricular ejection fraction <40% or pulmonary capillary wedge pressure >15 mmHg  Safety Data: Not Applicable.   [facility] Clinical Research Coordinator note:   This visit for Subject Vincent Meyers with DOB: 04/25/1946 on 08/24/2023 for the above protocol is ScreeningVisit  and is for purpose of research.   The consent for this encounter is under Protocol Version Amendment 1, Consent Version Revised [id number] and  is currently IRB approved.   Subject expressed continued interest and consent in continuing as a study subject. Subject confirmed that there was    no change in contact information (e.g. address, telephone, email). Subject thanked for participation in research and contribution to science. In this visit 08/24/2023 the subject will be evaluated by Principal Investigator named Lorayne Marek MD. This research coordinator has verified that the above investigator is up to date with his/her training logs.   The Subject was informed that the PI  continues to have oversight of the subject's visits and course through relevant discussions, reviews, and also specifically of this visit by routing of this note to the PI.      1. This visit is a key visit  of screening. The PI is available for  this visit.   Please see subject binder for further details.  Signed by  Christell Constant MD  Clinical Research Coordinator  [facility]  [place], [place] 12:29 PM 08/24/2023

## 2023-08-30 ENCOUNTER — Ambulatory Visit (HOSPITAL_BASED_OUTPATIENT_CLINIC_OR_DEPARTMENT_OTHER)
Admission: RE | Admit: 2023-08-30 | Discharge: 2023-08-30 | Disposition: A | Discharge status: Home / Self Care | Payer: Medicare HMO | Source: Ambulatory Visit | Attending: Pulmonary Disease | Admitting: Pulmonary Disease

## 2023-08-30 DIAGNOSIS — J849 Interstitial pulmonary disease, unspecified: Secondary | ICD-10-CM | POA: Diagnosis not present

## 2023-08-30 DIAGNOSIS — Z006 Encounter for examination for normal comparison and control in clinical research program: Secondary | ICD-10-CM

## 2023-08-30 DIAGNOSIS — J84112 Idiopathic pulmonary fibrosis: Secondary | ICD-10-CM

## 2023-08-30 DIAGNOSIS — R918 Other nonspecific abnormal finding of lung field: Secondary | ICD-10-CM | POA: Diagnosis not present

## 2023-09-01 ENCOUNTER — Encounter: Payer: Self-pay | Admitting: Internal Medicine

## 2023-09-01 ENCOUNTER — Ambulatory Visit: Payer: Medicare HMO | Admitting: Internal Medicine

## 2023-09-01 VITALS — BP 165/84 | HR 67 | Wt 216.0 lb

## 2023-09-01 DIAGNOSIS — J849 Interstitial pulmonary disease, unspecified: Secondary | ICD-10-CM

## 2023-09-01 DIAGNOSIS — R0902 Hypoxemia: Secondary | ICD-10-CM | POA: Diagnosis not present

## 2023-09-01 DIAGNOSIS — Z5181 Encounter for therapeutic drug level monitoring: Secondary | ICD-10-CM

## 2023-09-01 DIAGNOSIS — J84112 Idiopathic pulmonary fibrosis: Secondary | ICD-10-CM

## 2023-09-01 LAB — PULMONARY FUNCTION TEST
DL/VA % pred: 81 %
DL/VA: 3.26 ml/min/mmHg/L
DLCO cor % pred: 56 %
DLCO cor: 13.35 ml/min/mmHg
DLCO unc % pred: 56 %
DLCO unc: 13.35 ml/min/mmHg
FEF 25-75 Pre: 1.92 L/s
FEF2575-%Pred-Pre: 96 %
FEV1-%Pred-Pre: 81 %
FEV1-Pre: 2.26 L
FEV1FVC-%Pred-Pre: 118 %
FEV6-%Pred-Pre: 73 %
FEV6-Pre: 2.64 L
FEV6FVC-%Pred-Pre: 107 %
FVC-%Pred-Pre: 68 %
FVC-Pre: 2.64 L
Pre FEV1/FVC ratio: 85 %
Pre FEV6/FVC Ratio: 100 %
RV % pred: 61 %
RV: 1.52 L
TLC % pred: 63 %
TLC: 4.21 L

## 2023-09-01 NOTE — Progress Notes (Signed)
PFT done today. 

## 2023-09-01 NOTE — Progress Notes (Signed)
blood work such as LFT initially once a month for 6 months and then quarterly  07/04/2023 -   Chief Complaint  Patient presents with   Follow-up    F/up on oxygen.     HPI Vincent Meyers 77 y.o. -returns  for follow-up.  This follow-up is earlier than usual.  After the last visit we did a overnight pulse oximetry.  He desaturated but he declined nighttime oxygen.  He wants portable daytime oxygen.  He did desaturate with a sit/stand test last time but apparently the DME company would not given portable oxygen because they wanted a correction test.  In addition I believe they might even need a walking test.  He tells me that he only desaturated with a sit/stand test and is worried he might not desaturate with a walking test.  Otherwise he feels stable.  He is now on 1 big pill pirfenidone a day.    Vincent Meyers had ONO 05/31/23: <= 88% for 1h 21 min   Last liver function test was in June 2024 and normal.  Other issues - She wanted know what RSV vaccine I recommended this to him - She inquired about clinical trials.  Somebody and his family told him about and experimental medication.  I went over some key concepts about clinical trials.  Did indicate to him the primary purpose is for drug development.  Did not indicate chance of getting placebo.  Did indicate voluntary nature.  Did indicate need to follow-up.  He is very interested.  Told him we will put him on the list.   OV 09/01/2023  Subjective:  Patient ID: Vincent Meyers, male , DOB: 10-08-1946 , age 77 y.o. , MRN: [id number] , ADDRESS: 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  -Started pirfenidone mid to late February 2024;  low-dose protocol in the setting of chronic diarrhea. ->  Full dose by March/April 2024.  - Last HRCT March 2024 -> OCt 2024  GI workup for chronic diarrhea March 15, 2023:   OSA:   - Auto CPAP 08/26/21 to 09/24/21 >> used on 27 of 30 nights with average 8 hrs 46 min.  Average AHI 5.5 with median CPAP 7 and 95 th percentile CPAP 11 cm  Research   - signed ICF 08/24/23 for PHIDER study to evaaute for PH   Esbriet/Pirfenidone requires intensive drug monitoring due to high concerns for Adverse effects of , including  Drug Induced Liver Injury, significant GI side effects that include but not limited to Diarrhea, Nausea, Vomiting,  and other system side effects that include Fatigue, headaches, weight loss and other side effects such as skin rash. These will be monitored with  blood work such as LFT initially once a month for 6 months and then quarterly   HPI Vincent Meyers 77 y.o. -here for follow-up seen approximately 2 months ago.  He is tolerating his pirfenidone fine.  His last liver function test was  3. The mitral valve is normal in structure. No evidence of mitral valve  regurgitation. No evidence of mitral stenosis.   4. The aortic valve is tricuspid. Aortic valve regurgitation is not  visualized. No aortic stenosis is present.   5. Aortic dilatation noted. There is mild dilatation of the aortic root,  measuring 37 mm. There is mild dilatation of the ascending aorta,  measuring 37 mm.   6. The inferior vena cava is normal in size with <50% respiratory  variability, suggesting right atrial pressure of 8 mmHg.    OV 02/01/2023  Subjective:  Patient ID: Vincent Meyers, male , DOB: July 16, 1946 , age 77 y.o. , MRN: [id number] , ADDRESS: [address] [place] [address] PCP [name],  Raymond J, MD Patient Care Team: [name], [name], MD as PCP - General (Family Medicine) Thurmon Fair, MD as PCP - Cardiology (Cardiology)  This Provider for this visit: Treatment Team:  Attending Provider: Kalman Shan, MD      02/01/2023 -   Chief Complaint  Patient presents with   Follow-up    PFT f/up     HPI Vincent Meyers 77 y.o. -presents for follow-up.  He is on week 2 of pirfenidone.  He is now on 6 pills 3 times daily.  He is already noticing that his diarrhea has gone up.  Otherwise symptoms are stable compared to January 2024.  He did a pulmonary function test and his FVC shows a 13% decline in the last 1 year.  Although the CT scan of the chest states there is no decline in the last 1 year but there is progression on even on the CT scan from 2020.  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  He is behaving like a classic IPF patient.  He says he does do some water walking at [facility] and he does not feel dyspneic but when he does activity he feels dyspneic.  He asked about taking an inhaler for his dyspnea.  I visualized the scan he does not have any emphysema.  Therefore we decided not to add any long-acting inhalers for him.  We discussed pulmonary rehabilitation and he is willing to do that.  Of note he does have chronic diarrhea.  He has seen Dr. [name] for this at [facility].  He has upcoming visit with him.  He will have liver function test today.  He is open to the idea of clinical trials.        CT Chest data - HRCT 01/25/23  Narrative & Impression  CLINICAL DATA:  77 year old male former smoker (quit 35 years ago) with interstitial lung disease. Follow-up study.   EXAM: CT CHEST WITHOUT CONTRAST   TECHNIQUE: Multidetector CT imaging of the chest was performed following the standard protocol without intravenous contrast. High resolution imaging of the lungs, as well as inspiratory and expiratory imaging, was performed.   RADIATION DOSE  REDUCTION: This exam was performed according to the departmental dose-optimization program which includes automated exposure control, adjustment of the mA and/or kV according to patient size and/or use of iterative reconstruction technique.   COMPARISON:  High-resolution chest CT 10/30/2021.   FINDINGS: Cardiovascular: Heart size is normal. There is no significant pericardial fluid, thickening or pericardial calcification. There is aortic atherosclerosis, as well as atherosclerosis of the great vessels of the mediastinum and the coronary arteries, including calcified atherosclerotic plaque in the left main, left anterior descending, left circumflex and right coronary arteries.   Mediastinum/Nodes: Multiple  blood work such as LFT initially once a month for 6 months and then quarterly  07/04/2023 -   Chief Complaint  Patient presents with   Follow-up    F/up on oxygen.     HPI Vincent Meyers 77 y.o. -returns  for follow-up.  This follow-up is earlier than usual.  After the last visit we did a overnight pulse oximetry.  He desaturated but he declined nighttime oxygen.  He wants portable daytime oxygen.  He did desaturate with a sit/stand test last time but apparently the DME company would not given portable oxygen because they wanted a correction test.  In addition I believe they might even need a walking test.  He tells me that he only desaturated with a sit/stand test and is worried he might not desaturate with a walking test.  Otherwise he feels stable.  He is now on 1 big pill pirfenidone a day.    Vincent Meyers had ONO 05/31/23: <= 88% for 1h 21 min   Last liver function test was in June 2024 and normal.  Other issues - She wanted know what RSV vaccine I recommended this to him - She inquired about clinical trials.  Somebody and his family told him about and experimental medication.  I went over some key concepts about clinical trials.  Did indicate to him the primary purpose is for drug development.  Did not indicate chance of getting placebo.  Did indicate voluntary nature.  Did indicate need to follow-up.  He is very interested.  Told him we will put him on the list.   OV 09/01/2023  Subjective:  Patient ID: Vincent Meyers, male , DOB: 10-08-1946 , age 77 y.o. , MRN: [id number] , ADDRESS: 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  -Started pirfenidone mid to late February 2024;  low-dose protocol in the setting of chronic diarrhea. ->  Full dose by March/April 2024.  - Last HRCT March 2024 -> OCt 2024  GI workup for chronic diarrhea March 15, 2023:   OSA:   - Auto CPAP 08/26/21 to 09/24/21 >> used on 27 of 30 nights with average 8 hrs 46 min.  Average AHI 5.5 with median CPAP 7 and 95 th percentile CPAP 11 cm  Research   - signed ICF 08/24/23 for PHIDER study to evaaute for PH   Esbriet/Pirfenidone requires intensive drug monitoring due to high concerns for Adverse effects of , including  Drug Induced Liver Injury, significant GI side effects that include but not limited to Diarrhea, Nausea, Vomiting,  and other system side effects that include Fatigue, headaches, weight loss and other side effects such as skin rash. These will be monitored with  blood work such as LFT initially once a month for 6 months and then quarterly   HPI Vincent Meyers 77 y.o. -here for follow-up seen approximately 2 months ago.  He is tolerating his pirfenidone fine.  His last liver function test was  3. The mitral valve is normal in structure. No evidence of mitral valve  regurgitation. No evidence of mitral stenosis.   4. The aortic valve is tricuspid. Aortic valve regurgitation is not  visualized. No aortic stenosis is present.   5. Aortic dilatation noted. There is mild dilatation of the aortic root,  measuring 37 mm. There is mild dilatation of the ascending aorta,  measuring 37 mm.   6. The inferior vena cava is normal in size with <50% respiratory  variability, suggesting right atrial pressure of 8 mmHg.    OV 02/01/2023  Subjective:  Patient ID: Vincent Meyers, male , DOB: July 16, 1946 , age 77 y.o. , MRN: [id number] , ADDRESS: [address] [place] [address] PCP [name],  Raymond J, MD Patient Care Team: [name], [name], MD as PCP - General (Family Medicine) Thurmon Fair, MD as PCP - Cardiology (Cardiology)  This Provider for this visit: Treatment Team:  Attending Provider: Kalman Shan, MD      02/01/2023 -   Chief Complaint  Patient presents with   Follow-up    PFT f/up     HPI Vincent Meyers 77 y.o. -presents for follow-up.  He is on week 2 of pirfenidone.  He is now on 6 pills 3 times daily.  He is already noticing that his diarrhea has gone up.  Otherwise symptoms are stable compared to January 2024.  He did a pulmonary function test and his FVC shows a 13% decline in the last 1 year.  Although the CT scan of the chest states there is no decline in the last 1 year but there is progression on even on the CT scan from 2020.  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  He is behaving like a classic IPF patient.  He says he does do some water walking at [facility] and he does not feel dyspneic but when he does activity he feels dyspneic.  He asked about taking an inhaler for his dyspnea.  I visualized the scan he does not have any emphysema.  Therefore we decided not to add any long-acting inhalers for him.  We discussed pulmonary rehabilitation and he is willing to do that.  Of note he does have chronic diarrhea.  He has seen Dr. [name] for this at [facility].  He has upcoming visit with him.  He will have liver function test today.  He is open to the idea of clinical trials.        CT Chest data - HRCT 01/25/23  Narrative & Impression  CLINICAL DATA:  77 year old male former smoker (quit 35 years ago) with interstitial lung disease. Follow-up study.   EXAM: CT CHEST WITHOUT CONTRAST   TECHNIQUE: Multidetector CT imaging of the chest was performed following the standard protocol without intravenous contrast. High resolution imaging of the lungs, as well as inspiratory and expiratory imaging, was performed.   RADIATION DOSE  REDUCTION: This exam was performed according to the departmental dose-optimization program which includes automated exposure control, adjustment of the mA and/or kV according to patient size and/or use of iterative reconstruction technique.   COMPARISON:  High-resolution chest CT 10/30/2021.   FINDINGS: Cardiovascular: Heart size is normal. There is no significant pericardial fluid, thickening or pericardial calcification. There is aortic atherosclerosis, as well as atherosclerosis of the great vessels of the mediastinum and the coronary arteries, including calcified atherosclerotic plaque in the left main, left anterior descending, left circumflex and right coronary arteries.   Mediastinum/Nodes: Multiple  3. The mitral valve is normal in structure. No evidence of mitral valve  regurgitation. No evidence of mitral stenosis.   4. The aortic valve is tricuspid. Aortic valve regurgitation is not  visualized. No aortic stenosis is present.   5. Aortic dilatation noted. There is mild dilatation of the aortic root,  measuring 37 mm. There is mild dilatation of the ascending aorta,  measuring 37 mm.   6. The inferior vena cava is normal in size with <50% respiratory  variability, suggesting right atrial pressure of 8 mmHg.    OV 02/01/2023  Subjective:  Patient ID: Vincent Meyers, male , DOB: July 16, 1946 , age 77 y.o. , MRN: [id number] , ADDRESS: [address] [place] [address] PCP [name],  Raymond J, MD Patient Care Team: [name], [name], MD as PCP - General (Family Medicine) Thurmon Fair, MD as PCP - Cardiology (Cardiology)  This Provider for this visit: Treatment Team:  Attending Provider: Kalman Shan, MD      02/01/2023 -   Chief Complaint  Patient presents with   Follow-up    PFT f/up     HPI Vincent Meyers 77 y.o. -presents for follow-up.  He is on week 2 of pirfenidone.  He is now on 6 pills 3 times daily.  He is already noticing that his diarrhea has gone up.  Otherwise symptoms are stable compared to January 2024.  He did a pulmonary function test and his FVC shows a 13% decline in the last 1 year.  Although the CT scan of the chest states there is no decline in the last 1 year but there is progression on even on the CT scan from 2020.  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  He is behaving like a classic IPF patient.  He says he does do some water walking at [facility] and he does not feel dyspneic but when he does activity he feels dyspneic.  He asked about taking an inhaler for his dyspnea.  I visualized the scan he does not have any emphysema.  Therefore we decided not to add any long-acting inhalers for him.  We discussed pulmonary rehabilitation and he is willing to do that.  Of note he does have chronic diarrhea.  He has seen Dr. [name] for this at [facility].  He has upcoming visit with him.  He will have liver function test today.  He is open to the idea of clinical trials.        CT Chest data - HRCT 01/25/23  Narrative & Impression  CLINICAL DATA:  77 year old male former smoker (quit 35 years ago) with interstitial lung disease. Follow-up study.   EXAM: CT CHEST WITHOUT CONTRAST   TECHNIQUE: Multidetector CT imaging of the chest was performed following the standard protocol without intravenous contrast. High resolution imaging of the lungs, as well as inspiratory and expiratory imaging, was performed.   RADIATION DOSE  REDUCTION: This exam was performed according to the departmental dose-optimization program which includes automated exposure control, adjustment of the mA and/or kV according to patient size and/or use of iterative reconstruction technique.   COMPARISON:  High-resolution chest CT 10/30/2021.   FINDINGS: Cardiovascular: Heart size is normal. There is no significant pericardial fluid, thickening or pericardial calcification. There is aortic atherosclerosis, as well as atherosclerosis of the great vessels of the mediastinum and the coronary arteries, including calcified atherosclerotic plaque in the left main, left anterior descending, left circumflex and right coronary arteries.   Mediastinum/Nodes: Multiple  OV 12/20/2022 -evaluation of ILD care.  Referred by Dr. Craige Cotta from [facility].  Subjective:  Patient ID: Vincent Meyers, male , DOB: 12/21/1945 , age 28 y.o. , MRN: [id number] , ADDRESS: [address] [place] [address] PCP [name], Raymond J, MD Patient Care Team: [name], [name], MD as PCP - General (Family Medicine) Thurmon Fair, MD as PCP - Cardiology (Cardiology)  This Provider for this visit: Treatment Team:  Attending Provider: Kalman Shan, MD    12/20/2022 -   Chief Complaint  Patient presents with   New Patient (Initial Visit)    New pt from Dr Craige Cotta for ILD. Last CT scan was 2022. Echocardiogram was 11/11/2022. Pt is only on Albuterol as needed.      HPI Vincent Meyers 77 y.o. -history is gained talking to the patient and review of the external record.  He tells me that his primary care physician show told him some 5 years ago that he had scarring in the lung.  Then 2 years ago he was in [place] and got hospitalized for unrelated reasons and was told he had pulmonary fibrosis.  After that he did see Dr. Craige Cotta.  So he feels that he has had 5 years worth of insidious onset of shortness of breath that is slowly progressive.  More progressive in the last year or 2.  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      [facility] Comprehensive ILD Questionnaire  Symptoms:   Past Medical History :  - He does have sleep apnea and uses CPAP -sees Dr. Craige Cotta -He has irritable bowel syndrome and diarrhea.  GI referral in [place] is pending. -Does have acid reflux disease - Has cardiac history not otherwise specified. -Has had COVID in May 2022. -Denies any asthma or COPD.  Has an albuterol inhaler that does not help him.  ROS:  -Has fatigue and chronic pain particularly in the knees and shoulders. - Sometimes liquid just try to go down the windpipe and he chokes.  He does  have diarrhea with irritable bowel syndrome - He had dry mouth after COVID but he does not have that now. - Does have acid reflux  FAMILY HISTORY of LUNG DISEASE:  -Only he has pulmonary fibrosis but otherwise no lung disease in the family  PERSONAL EXPOSURE HISTORY:  -Smoke.  1967 1987.  Greater than 40 pack smoking history.  He has done some mild marijuana in the past.  Never used cocaine.  Never used intravenous drugs.  HOME  EXPOSURE and HOBBY DETAILS :  [place]  He is lived there for 5 years but the home is [address] years.  Previously lived in a 76 year old home.  He believes that might have been some mold or mildew in the shower curtain but is never seen there.  He does use a CPAP but there is no mold or mildew in it.  Detail organic and inorganic antigen exposure history in the house is negative.  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  He works with [employer].  He goes to the shop every few days a week.  He does have a motor home and travel.  He has not been to [place].  No agent  3. The mitral valve is normal in structure. No evidence of mitral valve  regurgitation. No evidence of mitral stenosis.   4. The aortic valve is tricuspid. Aortic valve regurgitation is not  visualized. No aortic stenosis is present.   5. Aortic dilatation noted. There is mild dilatation of the aortic root,  measuring 37 mm. There is mild dilatation of the ascending aorta,  measuring 37 mm.   6. The inferior vena cava is normal in size with <50% respiratory  variability, suggesting right atrial pressure of 8 mmHg.    OV 02/01/2023  Subjective:  Patient ID: Vincent Meyers, male , DOB: July 16, 1946 , age 77 y.o. , MRN: [id number] , ADDRESS: [address] [place] [address] PCP [name],  Raymond J, MD Patient Care Team: [name], [name], MD as PCP - General (Family Medicine) Thurmon Fair, MD as PCP - Cardiology (Cardiology)  This Provider for this visit: Treatment Team:  Attending Provider: Kalman Shan, MD      02/01/2023 -   Chief Complaint  Patient presents with   Follow-up    PFT f/up     HPI Vincent Meyers 77 y.o. -presents for follow-up.  He is on week 2 of pirfenidone.  He is now on 6 pills 3 times daily.  He is already noticing that his diarrhea has gone up.  Otherwise symptoms are stable compared to January 2024.  He did a pulmonary function test and his FVC shows a 13% decline in the last 1 year.  Although the CT scan of the chest states there is no decline in the last 1 year but there is progression on even on the CT scan from 2020.  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  He is behaving like a classic IPF patient.  He says he does do some water walking at [facility] and he does not feel dyspneic but when he does activity he feels dyspneic.  He asked about taking an inhaler for his dyspnea.  I visualized the scan he does not have any emphysema.  Therefore we decided not to add any long-acting inhalers for him.  We discussed pulmonary rehabilitation and he is willing to do that.  Of note he does have chronic diarrhea.  He has seen Dr. [name] for this at [facility].  He has upcoming visit with him.  He will have liver function test today.  He is open to the idea of clinical trials.        CT Chest data - HRCT 01/25/23  Narrative & Impression  CLINICAL DATA:  77 year old male former smoker (quit 35 years ago) with interstitial lung disease. Follow-up study.   EXAM: CT CHEST WITHOUT CONTRAST   TECHNIQUE: Multidetector CT imaging of the chest was performed following the standard protocol without intravenous contrast. High resolution imaging of the lungs, as well as inspiratory and expiratory imaging, was performed.   RADIATION DOSE  REDUCTION: This exam was performed according to the departmental dose-optimization program which includes automated exposure control, adjustment of the mA and/or kV according to patient size and/or use of iterative reconstruction technique.   COMPARISON:  High-resolution chest CT 10/30/2021.   FINDINGS: Cardiovascular: Heart size is normal. There is no significant pericardial fluid, thickening or pericardial calcification. There is aortic atherosclerosis, as well as atherosclerosis of the great vessels of the mediastinum and the coronary arteries, including calcified atherosclerotic plaque in the left main, left anterior descending, left circumflex and right coronary arteries.   Mediastinum/Nodes: Multiple  OV 12/20/2022 -evaluation of ILD care.  Referred by Dr. Craige Cotta from [facility].  Subjective:  Patient ID: Vincent Meyers, male , DOB: 12/21/1945 , age 28 y.o. , MRN: [id number] , ADDRESS: [address] [place] [address] PCP [name], Raymond J, MD Patient Care Team: [name], [name], MD as PCP - General (Family Medicine) Thurmon Fair, MD as PCP - Cardiology (Cardiology)  This Provider for this visit: Treatment Team:  Attending Provider: Kalman Shan, MD    12/20/2022 -   Chief Complaint  Patient presents with   New Patient (Initial Visit)    New pt from Dr Craige Cotta for ILD. Last CT scan was 2022. Echocardiogram was 11/11/2022. Pt is only on Albuterol as needed.      HPI Vincent Meyers 77 y.o. -history is gained talking to the patient and review of the external record.  He tells me that his primary care physician show told him some 5 years ago that he had scarring in the lung.  Then 2 years ago he was in [place] and got hospitalized for unrelated reasons and was told he had pulmonary fibrosis.  After that he did see Dr. Craige Cotta.  So he feels that he has had 5 years worth of insidious onset of shortness of breath that is slowly progressive.  More progressive in the last year or 2.  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      [facility] Comprehensive ILD Questionnaire  Symptoms:   Past Medical History :  - He does have sleep apnea and uses CPAP -sees Dr. Craige Cotta -He has irritable bowel syndrome and diarrhea.  GI referral in [place] is pending. -Does have acid reflux disease - Has cardiac history not otherwise specified. -Has had COVID in May 2022. -Denies any asthma or COPD.  Has an albuterol inhaler that does not help him.  ROS:  -Has fatigue and chronic pain particularly in the knees and shoulders. - Sometimes liquid just try to go down the windpipe and he chokes.  He does  have diarrhea with irritable bowel syndrome - He had dry mouth after COVID but he does not have that now. - Does have acid reflux  FAMILY HISTORY of LUNG DISEASE:  -Only he has pulmonary fibrosis but otherwise no lung disease in the family  PERSONAL EXPOSURE HISTORY:  -Smoke.  1967 1987.  Greater than 40 pack smoking history.  He has done some mild marijuana in the past.  Never used cocaine.  Never used intravenous drugs.  HOME  EXPOSURE and HOBBY DETAILS :  [place].  He is lived there for 5 years but the home is 75 years.  Previously lived in a 76 year old home.  He believes that might have been some mold or mildew in the shower curtain but is never seen there.  He does use a CPAP but there is no mold or mildew in it.  Detail organic and inorganic antigen exposure history in the house is negative.  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  He works with [employer].  He goes to the shop every few days a week.  He does have a motor home and travel.  He has not been to [place].  No agent  low 78% Auto CPAP 08/26/21 to 09/24/21 >> used on 27 of 30 nights with average 8 hrs 46 min.  Average AHI 5.5 with median CPAP 7 and 95 th percentile CPAP 11 cm H2O  OV 05/18/2023  Subjective:  Patient ID: Vincent Meyers, male , DOB: 01/13/1946 , age 74 y.o. 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  After my last visit he did see nurse practitioner in April 2024.  He also saw Dr. Amada Jupiter and his findings are summarized above.  He is currently on full dose pirfenidone.  His last liver function test was in April 2024.  He needs another liver function test today.  He tells me overall he is stable.  In the interim he did take his [place] motor bikes in 1966 [place] [employer] for display in [place].  He feels he is doing stable.  However his main concern is that his fatigue is more with pirfenidone.  In addition is also gained 7 pounds of weight.  He says the medicine is making him eat more.  He is actually taking more refined carbohydrates.  He says he loves nuts and is agreed to increase his nut intake.   He wanted know how the medication works.  I did tell him it was preventative.  His symptom scores below.  We did a sit/stand exercise hypoxemia test and it was positive.  He did desaturate to 82%.  He says at home he does drop pulse ox.  At night he is not using oxygen he does not have portable system with him.  He is open to the idea of having oxygen with him.  Other than that: He wants to roll over to the big pill pirfenidone.  I said we could do that. Esbriet/Pirfenidone requires intensive drug monitoring due to high concerns for Adverse effects of , including  Drug Induced Liver Injury, significant GI side effects that include but not limited to Diarrhea, Nausea, Vomiting,  and other system side effects that include Fatigue, headaches, weight loss and other side effects such as skin rash. These will be monitored with  blood work such as LFT initially once a month for 6 months and then quarterly      OV 07/04/2023  Subjective:  Patient ID: Vincent Meyers, male , DOB: 02-10-1946 , age 8 y.o. , MRN: [id number] , ADDRESS: [address] [place] [address] PCP [name], [name], MD Patient Care Team: [name], [name], MD as PCP - General (Family Medicine) Croitoru, Rachelle Hora, MD as PCP - Cardiology (Cardiology)  This Provider for this visit: Treatment Team:  Attending Provider: Kalman Shan, MD      Esbriet/Pirfenidone requires intensive drug monitoring due to high concerns for Adverse effects of , including  Drug Induced Liver Injury, significant GI side effects that include but not limited to Diarrhea, Nausea, Vomiting,  and other system side effects that include Fatigue, headaches, weight loss and other side effects such as skin rash. These will be monitored with  OV 12/20/2022 -evaluation of ILD care.  Referred by Dr. Craige Cotta from [facility].  Subjective:  Patient ID: Vincent Meyers, male , DOB: 12/21/1945 , age 28 y.o. , MRN: [id number] , ADDRESS: [address] [place] [address] PCP [name], Raymond J, MD Patient Care Team: [name], [name], MD as PCP - General (Family Medicine) Thurmon Fair, MD as PCP - Cardiology (Cardiology)  This Provider for this visit: Treatment Team:  Attending Provider: Kalman Shan, MD    12/20/2022 -   Chief Complaint  Patient presents with   New Patient (Initial Visit)    New pt from Dr Craige Cotta for ILD. Last CT scan was 2022. Echocardiogram was 11/11/2022. Pt is only on Albuterol as needed.      HPI Vincent Meyers 77 y.o. -history is gained talking to the patient and review of the external record.  He tells me that his primary care physician show told him some 5 years ago that he had scarring in the lung.  Then 2 years ago he was in [place] and got hospitalized for unrelated reasons and was told he had pulmonary fibrosis.  After that he did see Dr. Craige Cotta.  So he feels that he has had 5 years worth of insidious onset of shortness of breath that is slowly progressive.  More progressive in the last year or 2.  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      [facility] Comprehensive ILD Questionnaire  Symptoms:   Past Medical History :  - He does have sleep apnea and uses CPAP -sees Dr. Craige Cotta -He has irritable bowel syndrome and diarrhea.  GI referral in [place] is pending. -Does have acid reflux disease - Has cardiac history not otherwise specified. -Has had COVID in May 2022. -Denies any asthma or COPD.  Has an albuterol inhaler that does not help him.  ROS:  -Has fatigue and chronic pain particularly in the knees and shoulders. - Sometimes liquid just try to go down the windpipe and he chokes.  He does  have diarrhea with irritable bowel syndrome - He had dry mouth after COVID but he does not have that now. - Does have acid reflux  FAMILY HISTORY of LUNG DISEASE:  -Only he has pulmonary fibrosis but otherwise no lung disease in the family  PERSONAL EXPOSURE HISTORY:  -Smoke.  1967 1987.  Greater than 40 pack smoking history.  He has done some mild marijuana in the past.  Never used cocaine.  Never used intravenous drugs.  HOME  EXPOSURE and HOBBY DETAILS :  [place].  He is lived there for 5 years but the home is 75 years.  Previously lived in a 76 year old home.  He believes that might have been some mold or mildew in the shower curtain but is never seen there.  He does use a CPAP but there is no mold or mildew in it.  Detail organic and inorganic antigen exposure history in the house is negative.  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  He works with [employer].  He goes to the shop every few days a week.  He does have a motor home and travel.  He has not been to [place].  No agent  blood work such as LFT initially once a month for 6 months and then quarterly  07/04/2023 -   Chief Complaint  Patient presents with   Follow-up    F/up on oxygen.     HPI Vincent Meyers 77 y.o. -returns  for follow-up.  This follow-up is earlier than usual.  After the last visit we did a overnight pulse oximetry.  He desaturated but he declined nighttime oxygen.  He wants portable daytime oxygen.  He did desaturate with a sit/stand test last time but apparently the DME company would not given portable oxygen because they wanted a correction test.  In addition I believe they might even need a walking test.  He tells me that he only desaturated with a sit/stand test and is worried he might not desaturate with a walking test.  Otherwise he feels stable.  He is now on 1 big pill pirfenidone a day.    Vincent Meyers had ONO 05/31/23: <= 88% for 1h 21 min   Last liver function test was in June 2024 and normal.  Other issues - She wanted know what RSV vaccine I recommended this to him - She inquired about clinical trials.  Somebody and his family told him about and experimental medication.  I went over some key concepts about clinical trials.  Did indicate to him the primary purpose is for drug development.  Did not indicate chance of getting placebo.  Did indicate voluntary nature.  Did indicate need to follow-up.  He is very interested.  Told him we will put him on the list.   OV 09/01/2023  Subjective:  Patient ID: Vincent Meyers, male , DOB: 10-08-1946 , age 77 y.o. , MRN: [id number] , ADDRESS: 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  -Started pirfenidone mid to late February 2024;  low-dose protocol in the setting of chronic diarrhea. ->  Full dose by March/April 2024.  - Last HRCT March 2024 -> OCt 2024  GI workup for chronic diarrhea March 15, 2023:   OSA:   - Auto CPAP 08/26/21 to 09/24/21 >> used on 27 of 30 nights with average 8 hrs 46 min.  Average AHI 5.5 with median CPAP 7 and 95 th percentile CPAP 11 cm  Research   - signed ICF 08/24/23 for PHIDER study to evaaute for PH   Esbriet/Pirfenidone requires intensive drug monitoring due to high concerns for Adverse effects of , including  Drug Induced Liver Injury, significant GI side effects that include but not limited to Diarrhea, Nausea, Vomiting,  and other system side effects that include Fatigue, headaches, weight loss and other side effects such as skin rash. These will be monitored with  blood work such as LFT initially once a month for 6 months and then quarterly   HPI Vincent Meyers 77 y.o. -here for follow-up seen approximately 2 months ago.  He is tolerating his pirfenidone fine.  His last liver function test was  3. The mitral valve is normal in structure. No evidence of mitral valve  regurgitation. No evidence of mitral stenosis.   4. The aortic valve is tricuspid. Aortic valve regurgitation is not  visualized. No aortic stenosis is present.   5. Aortic dilatation noted. There is mild dilatation of the aortic root,  measuring 37 mm. There is mild dilatation of the ascending aorta,  measuring 37 mm.   6. The inferior vena cava is normal in size with <50% respiratory  variability, suggesting right atrial pressure of 8 mmHg.    OV 02/01/2023  Subjective:  Patient ID: Vincent Meyers, male , DOB: July 16, 1946 , age 77 y.o. , MRN: [id number] , ADDRESS: [address] [place] [address] PCP [name],  Raymond J, MD Patient Care Team: [name], [name], MD as PCP - General (Family Medicine) Thurmon Fair, MD as PCP - Cardiology (Cardiology)  This Provider for this visit: Treatment Team:  Attending Provider: Kalman Shan, MD      02/01/2023 -   Chief Complaint  Patient presents with   Follow-up    PFT f/up     HPI Vincent Meyers 77 y.o. -presents for follow-up.  He is on week 2 of pirfenidone.  He is now on 6 pills 3 times daily.  He is already noticing that his diarrhea has gone up.  Otherwise symptoms are stable compared to January 2024.  He did a pulmonary function test and his FVC shows a 13% decline in the last 1 year.  Although the CT scan of the chest states there is no decline in the last 1 year but there is progression on even on the CT scan from 2020.  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  He is behaving like a classic IPF patient.  He says he does do some water walking at [facility] and he does not feel dyspneic but when he does activity he feels dyspneic.  He asked about taking an inhaler for his dyspnea.  I visualized the scan he does not have any emphysema.  Therefore we decided not to add any long-acting inhalers for him.  We discussed pulmonary rehabilitation and he is willing to do that.  Of note he does have chronic diarrhea.  He has seen Dr. [name] for this at [facility].  He has upcoming visit with him.  He will have liver function test today.  He is open to the idea of clinical trials.        CT Chest data - HRCT 01/25/23  Narrative & Impression  CLINICAL DATA:  77 year old male former smoker (quit 35 years ago) with interstitial lung disease. Follow-up study.   EXAM: CT CHEST WITHOUT CONTRAST   TECHNIQUE: Multidetector CT imaging of the chest was performed following the standard protocol without intravenous contrast. High resolution imaging of the lungs, as well as inspiratory and expiratory imaging, was performed.   RADIATION DOSE  REDUCTION: This exam was performed according to the departmental dose-optimization program which includes automated exposure control, adjustment of the mA and/or kV according to patient size and/or use of iterative reconstruction technique.   COMPARISON:  High-resolution chest CT 10/30/2021.   FINDINGS: Cardiovascular: Heart size is normal. There is no significant pericardial fluid, thickening or pericardial calcification. There is aortic atherosclerosis, as well as atherosclerosis of the great vessels of the mediastinum and the coronary arteries, including calcified atherosclerotic plaque in the left main, left anterior descending, left circumflex and right coronary arteries.   Mediastinum/Nodes: Multiple

## 2023-09-01 NOTE — H&P (View-Only) (Signed)
3. The mitral valve is normal in structure. No evidence of mitral valve  regurgitation. No evidence of mitral stenosis.   4. The aortic valve is tricuspid. Aortic valve regurgitation is not  visualized. No aortic stenosis is present.   5. Aortic dilatation noted. There is mild dilatation of the aortic root,  measuring 37 mm. There is mild dilatation of the ascending aorta,  measuring 37 mm.   6. The inferior vena cava is normal in size with <50% respiratory  variability, suggesting right atrial pressure of 8 mmHg.    OV 02/01/2023  Subjective:  Patient ID: Vincent Meyers, male , DOB: July 16, 1946 , age 77 y.o. , MRN: [id number] , ADDRESS: [address] [place] [address] PCP [name],  Raymond J, MD Patient Care Team: [name], [name], MD as PCP - General (Family Medicine) Thurmon Fair, MD as PCP - Cardiology (Cardiology)  This Provider for this visit: Treatment Team:  Attending Provider: Kalman Shan, MD      02/01/2023 -   Chief Complaint  Patient presents with   Follow-up    PFT f/up     HPI Vincent Meyers 77 y.o. -presents for follow-up.  He is on week 2 of pirfenidone.  He is now on 6 pills 3 times daily.  He is already noticing that his diarrhea has gone up.  Otherwise symptoms are stable compared to January 2024.  He did a pulmonary function test and his FVC shows a 13% decline in the last 1 year.  Although the CT scan of the chest states there is no decline in the last 1 year but there is progression on even on the CT scan from 2020.  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  He is behaving like a classic IPF patient.  He says he does do some water walking at [facility] and he does not feel dyspneic but when he does activity he feels dyspneic.  He asked about taking an inhaler for his dyspnea.  I visualized the scan he does not have any emphysema.  Therefore we decided not to add any long-acting inhalers for him.  We discussed pulmonary rehabilitation and he is willing to do that.  Of note he does have chronic diarrhea.  He has seen Dr. [name] for this at [facility].  He has upcoming visit with him.  He will have liver function test today.  He is open to the idea of clinical trials.        CT Chest data - HRCT 01/25/23  Narrative & Impression  CLINICAL DATA:  77 year old male former smoker (quit 35 years ago) with interstitial lung disease. Follow-up study.   EXAM: CT CHEST WITHOUT CONTRAST   TECHNIQUE: Multidetector CT imaging of the chest was performed following the standard protocol without intravenous contrast. High resolution imaging of the lungs, as well as inspiratory and expiratory imaging, was performed.   RADIATION DOSE  REDUCTION: This exam was performed according to the departmental dose-optimization program which includes automated exposure control, adjustment of the mA and/or kV according to patient size and/or use of iterative reconstruction technique.   COMPARISON:  High-resolution chest CT 10/30/2021.   FINDINGS: Cardiovascular: Heart size is normal. There is no significant pericardial fluid, thickening or pericardial calcification. There is aortic atherosclerosis, as well as atherosclerosis of the great vessels of the mediastinum and the coronary arteries, including calcified atherosclerotic plaque in the left main, left anterior descending, left circumflex and right coronary arteries.   Mediastinum/Nodes: Multiple  OV 12/20/2022 -evaluation of ILD care.  Referred by Dr. Craige Cotta from [facility].  Subjective:  Patient ID: Vincent Meyers, male , DOB: 12/21/1945 , age 28 y.o. , MRN: [id number] , ADDRESS: [address] [place] [address] PCP [name], Raymond J, MD Patient Care Team: [name], [name], MD as PCP - General (Family Medicine) Thurmon Fair, MD as PCP - Cardiology (Cardiology)  This Provider for this visit: Treatment Team:  Attending Provider: Kalman Shan, MD    12/20/2022 -   Chief Complaint  Patient presents with   New Patient (Initial Visit)    New pt from Dr Craige Cotta for ILD. Last CT scan was 2022. Echocardiogram was 11/11/2022. Pt is only on Albuterol as needed.      HPI Vincent Meyers 77 y.o. -history is gained talking to the patient and review of the external record.  He tells me that his primary care physician show told him some 5 years ago that he had scarring in the lung.  Then 2 years ago he was in [place] and got hospitalized for unrelated reasons and was told he had pulmonary fibrosis.  After that he did see Dr. Craige Cotta.  So he feels that he has had 5 years worth of insidious onset of shortness of breath that is slowly progressive.  More progressive in the last year or 2.  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      [facility] Comprehensive ILD Questionnaire  Symptoms:   Past Medical History :  - He does have sleep apnea and uses CPAP -sees Dr. Craige Cotta -He has irritable bowel syndrome and diarrhea.  GI referral in [place] is pending. -Does have acid reflux disease - Has cardiac history not otherwise specified. -Has had COVID in May 2022. -Denies any asthma or COPD.  Has an albuterol inhaler that does not help him.  ROS:  -Has fatigue and chronic pain particularly in the knees and shoulders. - Sometimes liquid just try to go down the windpipe and he chokes.  He does  have diarrhea with irritable bowel syndrome - He had dry mouth after COVID but he does not have that now. - Does have acid reflux  FAMILY HISTORY of LUNG DISEASE:  -Only he has pulmonary fibrosis but otherwise no lung disease in the family  PERSONAL EXPOSURE HISTORY:  -Smoke.  1967 1987.  Greater than 40 pack smoking history.  He has done some mild marijuana in the past.  Never used cocaine.  Never used intravenous drugs.  HOME  EXPOSURE and HOBBY DETAILS :  [place].  He is lived there for 5 years but the home is [address] years.  Previously lived in a 76 year old home.  He believes that might have been some mold or mildew in the shower curtain but is never seen there.  He does use a CPAP but there is no mold or mildew in it.  Detail organic and inorganic antigen exposure history in the house is negative.  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  He works with [employer].  He goes to the shop every few days a week.  He does have a motor home and travel.  He has not been to [place].  No agent  3. The mitral valve is normal in structure. No evidence of mitral valve  regurgitation. No evidence of mitral stenosis.   4. The aortic valve is tricuspid. Aortic valve regurgitation is not  visualized. No aortic stenosis is present.   5. Aortic dilatation noted. There is mild dilatation of the aortic root,  measuring 37 mm. There is mild dilatation of the ascending aorta,  measuring 37 mm.   6. The inferior vena cava is normal in size with <50% respiratory  variability, suggesting right atrial pressure of 8 mmHg.    OV 02/01/2023  Subjective:  Patient ID: Vincent Meyers, male , DOB: July 16, 1946 , age 77 y.o. , MRN: [id number] , ADDRESS: [address] [place] [address] PCP [name],  Raymond J, MD Patient Care Team: [name], [name], MD as PCP - General (Family Medicine) Thurmon Fair, MD as PCP - Cardiology (Cardiology)  This Provider for this visit: Treatment Team:  Attending Provider: Kalman Shan, MD      02/01/2023 -   Chief Complaint  Patient presents with   Follow-up    PFT f/up     HPI Vincent Meyers 77 y.o. -presents for follow-up.  He is on week 2 of pirfenidone.  He is now on 6 pills 3 times daily.  He is already noticing that his diarrhea has gone up.  Otherwise symptoms are stable compared to January 2024.  He did a pulmonary function test and his FVC shows a 13% decline in the last 1 year.  Although the CT scan of the chest states there is no decline in the last 1 year but there is progression on even on the CT scan from 2020.  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  He is behaving like a classic IPF patient.  He says he does do some water walking at [facility] and he does not feel dyspneic but when he does activity he feels dyspneic.  He asked about taking an inhaler for his dyspnea.  I visualized the scan he does not have any emphysema.  Therefore we decided not to add any long-acting inhalers for him.  We discussed pulmonary rehabilitation and he is willing to do that.  Of note he does have chronic diarrhea.  He has seen Dr. [name] for this at [facility].  He has upcoming visit with him.  He will have liver function test today.  He is open to the idea of clinical trials.        CT Chest data - HRCT 01/25/23  Narrative & Impression  CLINICAL DATA:  77 year old male former smoker (quit 35 years ago) with interstitial lung disease. Follow-up study.   EXAM: CT CHEST WITHOUT CONTRAST   TECHNIQUE: Multidetector CT imaging of the chest was performed following the standard protocol without intravenous contrast. High resolution imaging of the lungs, as well as inspiratory and expiratory imaging, was performed.   RADIATION DOSE  REDUCTION: This exam was performed according to the departmental dose-optimization program which includes automated exposure control, adjustment of the mA and/or kV according to patient size and/or use of iterative reconstruction technique.   COMPARISON:  High-resolution chest CT 10/30/2021.   FINDINGS: Cardiovascular: Heart size is normal. There is no significant pericardial fluid, thickening or pericardial calcification. There is aortic atherosclerosis, as well as atherosclerosis of the great vessels of the mediastinum and the coronary arteries, including calcified atherosclerotic plaque in the left main, left anterior descending, left circumflex and right coronary arteries.   Mediastinum/Nodes: Multiple  low 78% Auto CPAP 08/26/21 to 09/24/21 >> used on 27 of 30 nights with average 8 hrs 46 min.  Average AHI 5.5 with median CPAP 7 and 95 th percentile CPAP 11 cm H2O  OV 05/18/2023  Subjective:  Patient ID: Vincent Meyers, male , DOB: 01/13/1946 , age 74 y.o. 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  After my last visit he did see nurse practitioner in April 2024.  He also saw Dr. Amada Jupiter and his findings are summarized above.  He is currently on full dose pirfenidone.  His last liver function test was in April 2024.  He needs another liver function test today.  He tells me overall he is stable.  In the interim he did take his [place] motor bikes in 1966 [place] for display in [place].  He feels he is doing stable.  However his main concern is that his fatigue is more with pirfenidone.  In addition is also gained 7 pounds of weight.  He says the medicine is making him eat more.  He is actually taking more refined carbohydrates.  He says he loves nuts and is agreed to increase his nut intake.   He wanted know how the medication works.  I did tell him it was preventative.  His symptom scores below.  We did a sit/stand exercise hypoxemia test and it was positive.  He did desaturate to 82%.  He says at home he does drop pulse ox.  At night he is not using oxygen he does not have portable system with him.  He is open to the idea of having oxygen with him.  Other than that: He wants to roll over to the big pill pirfenidone.  I said we could do that. Esbriet/Pirfenidone requires intensive drug monitoring due to high concerns for Adverse effects of , including  Drug Induced Liver Injury, significant GI side effects that include but not limited to Diarrhea, Nausea, Vomiting,  and other system side effects that include Fatigue, headaches, weight loss and other side effects such as skin rash. These will be monitored with  blood work such as LFT initially once a month for 6 months and then quarterly      OV 07/04/2023  Subjective:  Patient ID: Vincent Meyers, male , DOB: 02-10-1946 , age 8 y.o. , MRN: [id number] , ADDRESS: [address] [place] [address] PCP [name], [name], MD Patient Care Team: [name], [name], MD as PCP - General (Family Medicine) Croitoru, Rachelle Hora, MD as PCP - Cardiology (Cardiology)  This Provider for this visit: Treatment Team:  Attending Provider: Kalman Shan, MD      Esbriet/Pirfenidone requires intensive drug monitoring due to high concerns for Adverse effects of , including  Drug Induced Liver Injury, significant GI side effects that include but not limited to Diarrhea, Nausea, Vomiting,  and other system side effects that include Fatigue, headaches, weight loss and other side effects such as skin rash. These will be monitored with  low 78% Auto CPAP 08/26/21 to 09/24/21 >> used on 27 of 30 nights with average 8 hrs 46 min.  Average AHI 5.5 with median CPAP 7 and 95 th percentile CPAP 11 cm H2O  OV 05/18/2023  Subjective:  Patient ID: Vincent Meyers, male , DOB: 01/13/1946 , age 74 y.o. 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  After my last visit he did see nurse practitioner in April 2024.  He also saw Dr. Amada Jupiter and his findings are summarized above.  He is currently on full dose pirfenidone.  His last liver function test was in April 2024.  He needs another liver function test today.  He tells me overall he is stable.  In the interim he did take his [place] motor bikes in 1966 [place] for display in [place].  He feels he is doing stable.  However his main concern is that his fatigue is more with pirfenidone.  In addition is also gained 7 pounds of weight.  He says the medicine is making him eat more.  He is actually taking more refined carbohydrates.  He says he loves nuts and is agreed to increase his nut intake.   He wanted know how the medication works.  I did tell him it was preventative.  His symptom scores below.  We did a sit/stand exercise hypoxemia test and it was positive.  He did desaturate to 82%.  He says at home he does drop pulse ox.  At night he is not using oxygen he does not have portable system with him.  He is open to the idea of having oxygen with him.  Other than that: He wants to roll over to the big pill pirfenidone.  I said we could do that. Esbriet/Pirfenidone requires intensive drug monitoring due to high concerns for Adverse effects of , including  Drug Induced Liver Injury, significant GI side effects that include but not limited to Diarrhea, Nausea, Vomiting,  and other system side effects that include Fatigue, headaches, weight loss and other side effects such as skin rash. These will be monitored with  blood work such as LFT initially once a month for 6 months and then quarterly      OV 07/04/2023  Subjective:  Patient ID: Vincent Meyers, male , DOB: 02-10-1946 , age 8 y.o. , MRN: [id number] , ADDRESS: [address] [place] [address] PCP [name], [name], MD Patient Care Team: [name], [name], MD as PCP - General (Family Medicine) Croitoru, Rachelle Hora, MD as PCP - Cardiology (Cardiology)  This Provider for this visit: Treatment Team:  Attending Provider: Kalman Shan, MD      Esbriet/Pirfenidone requires intensive drug monitoring due to high concerns for Adverse effects of , including  Drug Induced Liver Injury, significant GI side effects that include but not limited to Diarrhea, Nausea, Vomiting,  and other system side effects that include Fatigue, headaches, weight loss and other side effects such as skin rash. These will be monitored with  blood work such as LFT initially once a month for 6 months and then quarterly  07/04/2023 -   Chief Complaint  Patient presents with   Follow-up    F/up on oxygen.     HPI Vincent Meyers 77 y.o. -returns  for follow-up.  This follow-up is earlier than usual.  After the last visit we did a overnight pulse oximetry.  He desaturated but he declined nighttime oxygen.  He wants portable daytime oxygen.  He did desaturate with a sit/stand test last time but apparently the DME company would not given portable oxygen because they wanted a correction test.  In addition I believe they might even need a walking test.  He tells me that he only desaturated with a sit/stand test and is worried he might not desaturate with a walking test.  Otherwise he feels stable.  He is now on 1 big pill pirfenidone a day.    Vincent Meyers had ONO 05/31/23: <= 88% for 1h 21 min   Last liver function test was in June 2024 and normal.  Other issues - She wanted know what RSV vaccine I recommended this to him - She inquired about clinical trials.  Somebody and his family told him about and experimental medication.  I went over some key concepts about clinical trials.  Did indicate to him the primary purpose is for drug development.  Did not indicate chance of getting placebo.  Did indicate voluntary nature.  Did indicate need to follow-up.  He is very interested.  Told him we will put him on the list.   OV 09/01/2023  Subjective:  Patient ID: Vincent Meyers, male , DOB: 10-08-1946 , age 30 y.o. , MRN: [id number] , ADDRESS: 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  -Started pirfenidone mid to late February 2024;  low-dose protocol in the setting of chronic diarrhea. ->  Full dose by March/April 2024.  - Last HRCT March 2024 -> OCt 2024  GI workup for chronic diarrhea March 15, 2023:   OSA:   - Auto CPAP 08/26/21 to 09/24/21 >> used on 27 of 30 nights with average 8 hrs 46 min.  Average AHI 5.5 with median CPAP 7 and 95 th percentile CPAP 11 cm  Research   - signed ICF 08/24/23 for PHIDER study to evaaute for PH   Esbriet/Pirfenidone requires intensive drug monitoring due to high concerns for Adverse effects of , including  Drug Induced Liver Injury, significant GI side effects that include but not limited to Diarrhea, Nausea, Vomiting,  and other system side effects that include Fatigue, headaches, weight loss and other side effects such as skin rash. These will be monitored with  blood work such as LFT initially once a month for 6 months and then quarterly   HPI Vincent Meyers 77 y.o. -here for follow-up seen approximately 2 months ago.  He is tolerating his pirfenidone fine.  His last liver function test was  blood work such as LFT initially once a month for 6 months and then quarterly  07/04/2023 -   Chief Complaint  Patient presents with   Follow-up    F/up on oxygen.     HPI Vincent Meyers 77 y.o. -returns  for follow-up.  This follow-up is earlier than usual.  After the last visit we did a overnight pulse oximetry.  He desaturated but he declined nighttime oxygen.  He wants portable daytime oxygen.  He did desaturate with a sit/stand test last time but apparently the DME company would not given portable oxygen because they wanted a correction test.  In addition I believe they might even need a walking test.  He tells me that he only desaturated with a sit/stand test and is worried he might not desaturate with a walking test.  Otherwise he feels stable.  He is now on 1 big pill pirfenidone a day.    Vincent Meyers had ONO 05/31/23: <= 88% for 1h 21 min   Last liver function test was in June 2024 and normal.  Other issues - She wanted know what RSV vaccine I recommended this to him - She inquired about clinical trials.  Somebody and his family told him about and experimental medication.  I went over some key concepts about clinical trials.  Did indicate to him the primary purpose is for drug development.  Did not indicate chance of getting placebo.  Did indicate voluntary nature.  Did indicate need to follow-up.  He is very interested.  Told him we will put him on the list.   OV 09/01/2023  Subjective:  Patient ID: Vincent Meyers, male , DOB: 10-08-1946 , age 30 y.o. , MRN: [id number] , ADDRESS: 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  -Started pirfenidone mid to late February 2024;  low-dose protocol in the setting of chronic diarrhea. ->  Full dose by March/April 2024.  - Last HRCT March 2024 -> OCt 2024  GI workup for chronic diarrhea March 15, 2023:   OSA:   - Auto CPAP 08/26/21 to 09/24/21 >> used on 27 of 30 nights with average 8 hrs 46 min.  Average AHI 5.5 with median CPAP 7 and 95 th percentile CPAP 11 cm  Research   - signed ICF 08/24/23 for PHIDER study to evaaute for PH   Esbriet/Pirfenidone requires intensive drug monitoring due to high concerns for Adverse effects of , including  Drug Induced Liver Injury, significant GI side effects that include but not limited to Diarrhea, Nausea, Vomiting,  and other system side effects that include Fatigue, headaches, weight loss and other side effects such as skin rash. These will be monitored with  blood work such as LFT initially once a month for 6 months and then quarterly   HPI Vincent Meyers 77 y.o. -here for follow-up seen approximately 2 months ago.  He is tolerating his pirfenidone fine.  His last liver function test was  blood work such as LFT initially once a month for 6 months and then quarterly  07/04/2023 -   Chief Complaint  Patient presents with   Follow-up    F/up on oxygen.     HPI Vincent Meyers 77 y.o. -returns  for follow-up.  This follow-up is earlier than usual.  After the last visit we did a overnight pulse oximetry.  He desaturated but he declined nighttime oxygen.  He wants portable daytime oxygen.  He did desaturate with a sit/stand test last time but apparently the DME company would not given portable oxygen because they wanted a correction test.  In addition I believe they might even need a walking test.  He tells me that he only desaturated with a sit/stand test and is worried he might not desaturate with a walking test.  Otherwise he feels stable.  He is now on 1 big pill pirfenidone a day.    Vincent Meyers had ONO 05/31/23: <= 88% for 1h 21 min   Last liver function test was in June 2024 and normal.  Other issues - She wanted know what RSV vaccine I recommended this to him - She inquired about clinical trials.  Somebody and his family told him about and experimental medication.  I went over some key concepts about clinical trials.  Did indicate to him the primary purpose is for drug development.  Did not indicate chance of getting placebo.  Did indicate voluntary nature.  Did indicate need to follow-up.  He is very interested.  Told him we will put him on the list.   OV 09/01/2023  Subjective:  Patient ID: Vincent Meyers, male , DOB: 10-08-1946 , age 30 y.o. , MRN: [id number] , ADDRESS: 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  -Started pirfenidone mid to late February 2024;  low-dose protocol in the setting of chronic diarrhea. ->  Full dose by March/April 2024.  - Last HRCT March 2024 -> OCt 2024  GI workup for chronic diarrhea March 15, 2023:   OSA:   - Auto CPAP 08/26/21 to 09/24/21 >> used on 27 of 30 nights with average 8 hrs 46 min.  Average AHI 5.5 with median CPAP 7 and 95 th percentile CPAP 11 cm  Research   - signed ICF 08/24/23 for PHIDER study to evaaute for PH   Esbriet/Pirfenidone requires intensive drug monitoring due to high concerns for Adverse effects of , including  Drug Induced Liver Injury, significant GI side effects that include but not limited to Diarrhea, Nausea, Vomiting,  and other system side effects that include Fatigue, headaches, weight loss and other side effects such as skin rash. These will be monitored with  blood work such as LFT initially once a month for 6 months and then quarterly   HPI Vincent Meyers 77 y.o. -here for follow-up seen approximately 2 months ago.  He is tolerating his pirfenidone fine.  His last liver function test was  3. The mitral valve is normal in structure. No evidence of mitral valve  regurgitation. No evidence of mitral stenosis.   4. The aortic valve is tricuspid. Aortic valve regurgitation is not  visualized. No aortic stenosis is present.   5. Aortic dilatation noted. There is mild dilatation of the aortic root,  measuring 37 mm. There is mild dilatation of the ascending aorta,  measuring 37 mm.   6. The inferior vena cava is normal in size with <50% respiratory  variability, suggesting right atrial pressure of 8 mmHg.    OV 02/01/2023  Subjective:  Patient ID: Vincent Meyers, male , DOB: July 16, 1946 , age 77 y.o. , MRN: [id number] , ADDRESS: [address] [place] [address] PCP [name],  Raymond J, MD Patient Care Team: [name], [name], MD as PCP - General (Family Medicine) Thurmon Fair, MD as PCP - Cardiology (Cardiology)  This Provider for this visit: Treatment Team:  Attending Provider: Kalman Shan, MD      02/01/2023 -   Chief Complaint  Patient presents with   Follow-up    PFT f/up     HPI Vincent Meyers 77 y.o. -presents for follow-up.  He is on week 2 of pirfenidone.  He is now on 6 pills 3 times daily.  He is already noticing that his diarrhea has gone up.  Otherwise symptoms are stable compared to January 2024.  He did a pulmonary function test and his FVC shows a 13% decline in the last 1 year.  Although the CT scan of the chest states there is no decline in the last 1 year but there is progression on even on the CT scan from 2020.  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  He is behaving like a classic IPF patient.  He says he does do some water walking at [facility] and he does not feel dyspneic but when he does activity he feels dyspneic.  He asked about taking an inhaler for his dyspnea.  I visualized the scan he does not have any emphysema.  Therefore we decided not to add any long-acting inhalers for him.  We discussed pulmonary rehabilitation and he is willing to do that.  Of note he does have chronic diarrhea.  He has seen Dr. [name] for this at [facility].  He has upcoming visit with him.  He will have liver function test today.  He is open to the idea of clinical trials.        CT Chest data - HRCT 01/25/23  Narrative & Impression  CLINICAL DATA:  77 year old male former smoker (quit 35 years ago) with interstitial lung disease. Follow-up study.   EXAM: CT CHEST WITHOUT CONTRAST   TECHNIQUE: Multidetector CT imaging of the chest was performed following the standard protocol without intravenous contrast. High resolution imaging of the lungs, as well as inspiratory and expiratory imaging, was performed.   RADIATION DOSE  REDUCTION: This exam was performed according to the departmental dose-optimization program which includes automated exposure control, adjustment of the mA and/or kV according to patient size and/or use of iterative reconstruction technique.   COMPARISON:  High-resolution chest CT 10/30/2021.   FINDINGS: Cardiovascular: Heart size is normal. There is no significant pericardial fluid, thickening or pericardial calcification. There is aortic atherosclerosis, as well as atherosclerosis of the great vessels of the mediastinum and the coronary arteries, including calcified atherosclerotic plaque in the left main, left anterior descending, left circumflex and right coronary arteries.   Mediastinum/Nodes: Multiple  blood work such as LFT initially once a month for 6 months and then quarterly  07/04/2023 -   Chief Complaint  Patient presents with   Follow-up    F/up on oxygen.     HPI Vincent Meyers 77 y.o. -returns  for follow-up.  This follow-up is earlier than usual.  After the last visit we did a overnight pulse oximetry.  He desaturated but he declined nighttime oxygen.  He wants portable daytime oxygen.  He did desaturate with a sit/stand test last time but apparently the DME company would not given portable oxygen because they wanted a correction test.  In addition I believe they might even need a walking test.  He tells me that he only desaturated with a sit/stand test and is worried he might not desaturate with a walking test.  Otherwise he feels stable.  He is now on 1 big pill pirfenidone a day.    Vincent Meyers had ONO 05/31/23: <= 88% for 1h 21 min   Last liver function test was in June 2024 and normal.  Other issues - She wanted know what RSV vaccine I recommended this to him - She inquired about clinical trials.  Somebody and his family told him about and experimental medication.  I went over some key concepts about clinical trials.  Did indicate to him the primary purpose is for drug development.  Did not indicate chance of getting placebo.  Did indicate voluntary nature.  Did indicate need to follow-up.  He is very interested.  Told him we will put him on the list.   OV 09/01/2023  Subjective:  Patient ID: Vincent Meyers, male , DOB: 10-08-1946 , age 30 y.o. , MRN: [id number] , ADDRESS: 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  -Started pirfenidone mid to late February 2024;  low-dose protocol in the setting of chronic diarrhea. ->  Full dose by March/April 2024.  - Last HRCT March 2024 -> OCt 2024  GI workup for chronic diarrhea March 15, 2023:   OSA:   - Auto CPAP 08/26/21 to 09/24/21 >> used on 27 of 30 nights with average 8 hrs 46 min.  Average AHI 5.5 with median CPAP 7 and 95 th percentile CPAP 11 cm  Research   - signed ICF 08/24/23 for PHIDER study to evaaute for PH   Esbriet/Pirfenidone requires intensive drug monitoring due to high concerns for Adverse effects of , including  Drug Induced Liver Injury, significant GI side effects that include but not limited to Diarrhea, Nausea, Vomiting,  and other system side effects that include Fatigue, headaches, weight loss and other side effects such as skin rash. These will be monitored with  blood work such as LFT initially once a month for 6 months and then quarterly   HPI Vincent Meyers 77 y.o. -here for follow-up seen approximately 2 months ago.  He is tolerating his pirfenidone fine.  His last liver function test was  3. The mitral valve is normal in structure. No evidence of mitral valve  regurgitation. No evidence of mitral stenosis.   4. The aortic valve is tricuspid. Aortic valve regurgitation is not  visualized. No aortic stenosis is present.   5. Aortic dilatation noted. There is mild dilatation of the aortic root,  measuring 37 mm. There is mild dilatation of the ascending aorta,  measuring 37 mm.   6. The inferior vena cava is normal in size with <50% respiratory  variability, suggesting right atrial pressure of 8 mmHg.    OV 02/01/2023  Subjective:  Patient ID: Vincent Meyers, male , DOB: July 16, 1946 , age 77 y.o. , MRN: [id number] , ADDRESS: [address] [place] [address] PCP [name],  Raymond J, MD Patient Care Team: [name], [name], MD as PCP - General (Family Medicine) Thurmon Fair, MD as PCP - Cardiology (Cardiology)  This Provider for this visit: Treatment Team:  Attending Provider: Kalman Shan, MD      02/01/2023 -   Chief Complaint  Patient presents with   Follow-up    PFT f/up     HPI Vincent Meyers 77 y.o. -presents for follow-up.  He is on week 2 of pirfenidone.  He is now on 6 pills 3 times daily.  He is already noticing that his diarrhea has gone up.  Otherwise symptoms are stable compared to January 2024.  He did a pulmonary function test and his FVC shows a 13% decline in the last 1 year.  Although the CT scan of the chest states there is no decline in the last 1 year but there is progression on even on the CT scan from 2020.  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  He is behaving like a classic IPF patient.  He says he does do some water walking at [facility] and he does not feel dyspneic but when he does activity he feels dyspneic.  He asked about taking an inhaler for his dyspnea.  I visualized the scan he does not have any emphysema.  Therefore we decided not to add any long-acting inhalers for him.  We discussed pulmonary rehabilitation and he is willing to do that.  Of note he does have chronic diarrhea.  He has seen Dr. [name] for this at [facility].  He has upcoming visit with him.  He will have liver function test today.  He is open to the idea of clinical trials.        CT Chest data - HRCT 01/25/23  Narrative & Impression  CLINICAL DATA:  77 year old male former smoker (quit 35 years ago) with interstitial lung disease. Follow-up study.   EXAM: CT CHEST WITHOUT CONTRAST   TECHNIQUE: Multidetector CT imaging of the chest was performed following the standard protocol without intravenous contrast. High resolution imaging of the lungs, as well as inspiratory and expiratory imaging, was performed.   RADIATION DOSE  REDUCTION: This exam was performed according to the departmental dose-optimization program which includes automated exposure control, adjustment of the mA and/or kV according to patient size and/or use of iterative reconstruction technique.   COMPARISON:  High-resolution chest CT 10/30/2021.   FINDINGS: Cardiovascular: Heart size is normal. There is no significant pericardial fluid, thickening or pericardial calcification. There is aortic atherosclerosis, as well as atherosclerosis of the great vessels of the mediastinum and the coronary arteries, including calcified atherosclerotic plaque in the left main, left anterior descending, left circumflex and right coronary arteries.   Mediastinum/Nodes: Multiple  OV 12/20/2022 -evaluation of ILD care.  Referred by Dr. Craige Cotta from [facility] location.  Subjective:  Patient ID: Vincent Meyers, male , DOB: 12/21/1945 , age 28 y.o. , MRN: [id number] , ADDRESS: [address] [place] [address] PCP [name], Raymond J, MD Patient Care Team: [name], [name], MD as PCP - General (Family Medicine) Thurmon Fair, MD as PCP - Cardiology (Cardiology)  This Provider for this visit: Treatment Team:  Attending Provider: Kalman Shan, MD    12/20/2022 -   Chief Complaint  Patient presents with   New Patient (Initial Visit)    New pt from Dr Craige Cotta for ILD. Last CT scan was 2022. Echocardiogram was 11/11/2022. Pt is only on Albuterol as needed.      HPI Vincent Meyers 77 y.o. -history is gained talking to the patient and review of the external record.  He tells me that his primary care physician show told him some 5 years ago that he had scarring in the lung.  Then 2 years ago he was in [place] and got hospitalized for unrelated reasons and was told he had pulmonary fibrosis.  After that he did see Dr. Craige Cotta.  So he feels that he has had 5 years worth of insidious onset of shortness of breath that is slowly progressive.  More progressive in the last year or 2.  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      [facility] Comprehensive ILD Questionnaire  Symptoms:   Past Medical History :  - He does have sleep apnea and uses CPAP -sees Dr. Craige Cotta -He has irritable bowel syndrome and diarrhea.  GI referral in [place] is pending. -Does have acid reflux disease - Has cardiac history not otherwise specified. -Has had COVID in May 2022. -Denies any asthma or COPD.  Has an albuterol inhaler that does not help him.  ROS:  -Has fatigue and chronic pain particularly in the knees and shoulders. - Sometimes liquid just try to go down the windpipe and he chokes.  He does  have diarrhea with irritable bowel syndrome - He had dry mouth after COVID but he does not have that now. - Does have acid reflux  FAMILY HISTORY of LUNG DISEASE:  -Only he has pulmonary fibrosis but otherwise no lung disease in the family  PERSONAL EXPOSURE HISTORY:  -Smoke.  1967 1987.  Greater than 40 pack smoking history.  He has done some mild marijuana in the past.  Never used cocaine.  Never used intravenous drugs.  HOME  EXPOSURE and HOBBY DETAILS :  [place]  He is lived there for 5 years but the home is [address] years.  Previously lived in a 76 year old home.  He believes that might have been some mold or mildew in the shower curtain but is never seen there.  He does use a CPAP but there is no mold or mildew in it.  Detail organic and inorganic antigen exposure history in the house is negative.  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  He works with [employer].  He goes to the shop every few days a week.  He does have a motor home and travel.  He has not been to [place].  No agent

## 2023-09-01 NOTE — Patient Instructions (Addendum)
ICD-10-CM   1. ILD (interstitial lung disease) (HCC)  J84.9     2. Encounter for therapeutic drug monitoring  Z51.81     3. Exercise hypoxemia  R09.02         Pulmonary fibrosis is been slowly progressive between 2020 and 2024.  There is at least a greater than 10% decline in pulmonary function test between 2023 and March 2024.  Glad you are on pirfenidone since early February 2024 and currently on full dose and tolerating it well. But there is more progression 09/01/2023 - faster desaturaion now since August 2024   LFT nomral 05/18/23  Heard your concernt that pulse portable o2 is not enough for you  Noticed you are on PHINDER research study currently  Plan -Continue full dose protocol  801 mg/day [large pill] at 1 pill 3 times daily  -Take it with food  -Apply sunscreen  -Space medications at least 5-6 hours apart  - Check LFT 09/01/2023   -Combat weight gain  -Eat more nuts and plain [place] yogurt  -Avoid simple carbohydrates and refined sugars   - talk to PCP [name], [name], MD about new weight loss drug  --Continue 2 L oxygen portable system - 5L   - you might not have a choice outside of pulse with portable systems  - can contact [facility] To see what they have   - moniutr pusle ox wwiht exertion  -[place]otniue screening in PHINDER study   - awaiting CT results for qualifiecation   - other studies if you do not qualify for [facility]  Follow-up - spirometry (no need for dlco) in 12 weeks - Return in 12- weeks to see see Dr. Marchelle Gearing 30-minute visit-

## 2023-09-02 ENCOUNTER — Other Ambulatory Visit: Payer: Self-pay

## 2023-09-04 ENCOUNTER — Encounter: Payer: Self-pay | Admitting: Internal Medicine

## 2023-09-05 ENCOUNTER — Telehealth (HOSPITAL_COMMUNITY): Payer: Self-pay | Admitting: Cardiology

## 2023-09-05 NOTE — Telephone Encounter (Signed)
PHINDE STUDY CATH NEEDED PER RAMASWANY    [facility] [address] [place] [address] Dept: [phone] Loc: [phone]  Vincent Meyers  09/05/2023  You are scheduled for a Cardiac Catheterization on Friday, October 18 with Dr. Marca Ancona.  1. Please arrive at the [facility] (Main Entrance A) at [facility]: [address] [place], [place] [address] at 10:00 AM (This time is 2 hour(s) before your procedure to ensure your preparation). Free valet parking service is available. You will check in at ADMITTING. The support person will be asked to wait in the waiting room.  It is OK to have someone drop you off and come back when you are ready to be discharged.    Special note: Every effort is made to have your procedure done on time. Please understand that emergencies sometimes delay scheduled procedures.  2. Diet: Do not eat solid foods after midnight.  The patient may have clear liquids until 5am upon the day of the procedure.  3. Labs: PRE PROCEDURE LABS ARE NEEDED (BMET AND CBC) OK FOR DR RAMASWAMY TO DRAW AT FOLLOW UP 10-15 OR 10-16  4. Medication instructions in preparation for your procedure:   Contrast Allergy: No       On the morning of your procedure, take your Aspirin 81 mg and any morning medicines NOT listed above.  You may use sips of water.  5. Plan to go home the same day, you will only stay overnight if medically necessary. 6. Bring a current list of your medications and current insurance cards. 7. You MUST have a responsible person to drive you home. 8. Someone MUST be with you the first 24 hours after you arrive home or your discharge will be delayed. 9. Please wear clothes that are easy to get on and off and wear slip-on shoes.  Thank you for allowing [place] to care for you!   -- [facility]

## 2023-09-06 ENCOUNTER — Encounter: Payer: Medicare HMO | Admitting: Internal Medicine

## 2023-09-06 ENCOUNTER — Telehealth: Payer: Self-pay | Admitting: Internal Medicine

## 2023-09-06 DIAGNOSIS — I288 Other diseases of pulmonary vessels: Secondary | ICD-10-CM

## 2023-09-06 DIAGNOSIS — Z006 Encounter for examination for normal comparison and control in clinical research program: Secondary | ICD-10-CM

## 2023-09-06 DIAGNOSIS — J84112 Idiopathic pulmonary fibrosis: Secondary | ICD-10-CM

## 2023-09-06 DIAGNOSIS — R911 Solitary pulmonary nodule: Secondary | ICD-10-CM

## 2023-09-06 NOTE — Research (Cosign Needed)
[place]:  "PHINDER: Pulmonary Hypertension Screening in Patients with Interstitial Lung Disease for Earlier Detection"  Primary Endpoint  To prospectively evaluate screening strategies for pulmonary hypertension (PH) in patients with ILD in an effort to promote awareness and encourage screening for PH in this patient population. Results from this study will be used to identify and weigh specific clinical parameters based on their prognostic significance for right heart catheterization (RHC)-confirmed PH in patients with ILD.  Study Code: GMS-PH-001 Protocol Edition: Amendment 1 dated 15 Feb 2022 Sponsor Name: [employer] Primary Investigator: Dr. Susy Frizzle Hunsucker  Protocol Version for 09/06/2023 date of 15 Feb 2022  Consent Version for 09/06/2023 date of 05 Jul 2022   Lab Manual: v1 dated 29 Jan 2022   Key Inclusion Criteria:  Patients are eligible if they have a diagnosis of ILD based on computed tomography imaging, including:  Idiopathic interstitial pneumonia, including idiopathic pulmonary fibrosis  Connective tissue disease-associated ILD with forced vital capacity (FVC) <70%  Hypersensitivity pneumonitis  Scleroderma-related ILD  Autoimmune ILD  Nonspecific interstitial pneumonia  Occupational lung disease  Combined pulmonary fibrosis and emphysema  Additionally, patients must meet a total of 2 or more criteria from at least 2 distinct categories below (ie, Category 1, Category 2, Category 3) based on the Investigator's clinical judgment, within 180 days of Screening.  Category 1: Clinical tests: a) Pulmonary function tests (PFTs)  meeting specified study guidelines for DLCO. b) Historical HRCT with specified findings consistent with increased pulmonary arterial pressure.  Category 2: Symptoms, oxygenation, exercise capacity, and brain natriuretic peptide/N-terminal pro-brain natriuretic peptide indicative of worsening status. Category 3:  Echocardiography Specified findings of increased RV systolic pressure or RV enlargement.  If a patient does not have a historical PFT, HRCT, or echocardiogram needed to assess eligibility for the study, these procedures will be performed as part of study screening.  Key Exclusion Criteria:  Patients are excluded if any of the following criteria apply:  Prior RHC with mean pulmonary artery pressure >20 mmHg  Currently on an FDA-approved pulmonary arterial hypertension medication  Diagnosed with chronic obstructive pulmonary disease  Uncontrolled or untreated sleep apnea  Pulmonary embolism within the past 3 months  History of ischemic heart disease or left-sided myocardial dysfunction within 12 months of Screening, defined as left ventricular ejection fraction <40% or pulmonary capillary wedge pressure >15 mmHg  Safety Data: Not Applicable.    [facility] [place] Clinical Research Coordinator note:   This visit for Subject Vincent Meyers with DOB: August 27, 1946 on 09/06/2023 for the above protocol is Visit/Encounter # 1  and is for purpose of research.   The consent for this encounter is under Protocol Version amendment 1, Consent Version Revised [id number] and is currently IRB approved.   Subject expressed continued interest and consent in continuing as a study subject. Subject confirmed that there was    no change in contact information (e.g. address, telephone, email). Subject thanked for participation in research and contribution to science. In this visit 09/06/2023 the subject will be evaluated by  Sub-Investigator named Dr. Kalman Shan. This research coordinator has verified that the above investigator is  up to date with his/her training logs.   The Subject was informed that the PI  continues to have oversight of the subject's visits and course through relevant discussions, reviews, and also specifically of this visit by routing of this note to the PI.  All visit one assessments  were completed and pt was given instructions for RHC scheduled October  40,9811 which will be end of study. Adverse events including diarrhea which resolved and an enlarging nodule on subject's HRCT were noted; a follow up study will be scheduled for the nodule as part of standard of care. Please see subject binder for further details.   Signed by  Christell Constant MD  Clinical Research Coordinator [facility]  [place], [place] 11:25 AM 09/06/2023

## 2023-09-06 NOTE — Telephone Encounter (Signed)
His most recent CT scan did show increase in right lower lobe nodule to 11 mm.  Radiology is recommending a CT follow-up in 6 months.   reports that he quit smoking about 35 years ago. His smoking use included cigarettes. He started smoking about 58 years ago. He has a 46 pack-year smoking history. He has never used smokeless tobacco.   Plan - Consider getting a CT in 6 months please order a PET scan in 3 months for right lower lobe 11 mm nodule

## 2023-09-06 NOTE — Patient Instructions (Signed)
ICD-10-CM   1. Research study patient  Z00.6     2. 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

## 2023-09-06 NOTE — Telephone Encounter (Signed)
PET was ordered

## 2023-09-06 NOTE — Progress Notes (Signed)
TItle:  "PHINDER: Pulmonary Hypertension Screening in Patients with Interstitial Lung Disease for Earlier Detection"  Primary Endpoint  To prospectively evaluate screening strategies for pulmonary hypertension (PH) in patients with ILD in an effort to promote awareness and encourage screening for PH in this patient population. Results from this study will be used to identify and weigh specific clinical parameters based on their prognostic significance for right heart catheterization (RHC)-confirmed PH in patients with ILD.  Study Code: GMS-PH-001 Protocol Edition: Amendment 1 dated 15 Feb 2022 Sponsor Name: [employer] Primary Investigator: Dr. Susy Frizzle Hunsucker  Protocol Version for 09/06/2023 date of 15 Feb 2022  Consent Version for 09/06/2023 date of 05 Jul 2022   Lab Manual: v1 dated 29 Jan 2022   Key Inclusion Criteria:  Patients are eligible if they have a diagnosis of ILD based on computed tomography imaging, including:  Idiopathic interstitial pneumonia, including idiopathic pulmonary fibrosis  Connective tissue disease-associated ILD with forced vital capacity (FVC) <70%  Hypersensitivity pneumonitis  Scleroderma-related ILD  Autoimmune ILD  Nonspecific interstitial pneumonia  Occupational lung disease  Combined pulmonary fibrosis and emphysema  Additionally, patients must meet a total of 2 or more criteria from at least 2 distinct categories below (ie, Category 1, Category 2, Category 3) based on the Investigator's clinical judgment, within 180 days of Screening.  Category 1: Clinical tests: a) Pulmonary function tests (PFTs)  meeting specified study guidelines for DLCO. b) Historical HRCT with specified findings consistent with increased pulmonary arterial pressure.  Category 2: Symptoms, oxygenation, exercise capacity, and brain natriuretic peptide/N-terminal pro-brain natriuretic peptide indicative of worsening status. Category 3:  Echocardiography Specified findings of increased RV systolic pressure or RV enlargement.  If a patient does not have a historical PFT, HRCT, or echocardiogram needed to assess eligibility for the study, these procedures will be performed as part of study screening.  Key Exclusion Criteria:  Patients are excluded if any of the following criteria apply:  Prior RHC with mean pulmonary artery pressure >20 mmHg  Currently on an FDA-approved pulmonary arterial hypertension medication  Diagnosed with chronic obstructive pulmonary disease  Uncontrolled or untreated sleep apnea  Pulmonary embolism within the past 3 months  History of ischemic heart disease or left-sided myocardial dysfunction within 12 months of Screening, defined as left ventricular ejection fraction <40% or pulmonary capillary wedge pressure >15 mmHg  Safety Data: Not Applicable.     X[id number]  S: This visit for Subject Vincent Meyers with DOB: 11-06-1946 on 09/06/2023 for the above protocol is Visit/Encounter # #1  and is for purpose of research . Subject/LAR expressed continued interest and consent in continuing as a study subject. Subject thanked for participation in research and contribution to science.   After he signed consent he did have limited diarrhea but then this stopped.  He did have a CT scan of the chest which showed enlarged pulmonary artery and this is one of the basis for meeting inclusion criteria for the study.  He is scheduled for right heart cath.  He has been counseled that the right heart cath is researched procedure and he should not be getting any insurance.  Nasal should will go to the insurance.  According to the coordinator the billing department did initially contact him but since then they have contacted the research coordinator and they have been advised that this should be a research encounter.    CT chest 08/30/23:   IMPRESSION: 1. 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 Findings  are consistent with UIP per consensus guidelines: Diagnosis of Idiopathic Pulmonary Fibrosis: An Official ATS/ERS/JRS/ALAT Clinical Practice Guideline. Am Rosezetta Schlatter Crit Care Med Vol 198, Iss 5, [phone], Jul 23 2017. 2. Dilated main pulmonary artery, which can be seen in the setting of pulmonary hypertension. 3. A perifissural solid nodule is slightly increased in size, possibly a reactive intrapulmonary lymph node. Recommend 6 month follow-up for further evaluation.. Solid perifissural nodule of the right lower lobe measuring 11 x 5 mm on series 7, image 139, previously measured 7 x 4 mm. 4. Coronary artery calcifications and aortic Atherosclerosis (ICD10-I70.0).     Electronically Signed   By: Allegra Lai M.D.   On: 09/01/2023 16:21     - As baseline he does seem to have a loud P2 it is documented in the paper record.     ICD-10-CM   1. Research study patient  Z00.6     2. 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 Research study patient  Z00.6     2. 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 Kalman Shan, M.D., F.C.C.P, ACRP-CPI Pulmonary and Critical Care Medicine Research Investigator, [facility] @ [facility] Health Staff Physician, [facility] Center Director - Interstitial Lung Disease  Program  Pulmonary Fibrosis [facility] [place] [facility] and [facility] @ [facility] [place], [place], [address]   Pager: [phone], If no answer  OR between  19:00-7:00h: page [phone] Telephone (research): [phone]  10:34  AM 09/06/2023   10:34 AM 09/06/2023

## 2023-09-07 ENCOUNTER — Other Ambulatory Visit: Payer: Self-pay

## 2023-09-09 ENCOUNTER — Other Ambulatory Visit: Payer: Self-pay

## 2023-09-09 ENCOUNTER — Ambulatory Visit (HOSPITAL_COMMUNITY)
Admission: RE | Admit: 2023-09-09 | Discharge: 2023-09-09 | Disposition: A | Discharge status: Home / Self Care | Payer: Self-pay | Attending: Cardiology | Admitting: Cardiology

## 2023-09-09 ENCOUNTER — Encounter (HOSPITAL_COMMUNITY)
Admission: RE | Disposition: A | Discharge status: Home / Self Care | Payer: Self-pay | Source: Home / Self Care | Attending: Cardiology

## 2023-09-09 DIAGNOSIS — I272 Pulmonary hypertension, unspecified: Secondary | ICD-10-CM

## 2023-09-09 DIAGNOSIS — J84112 Idiopathic pulmonary fibrosis: Secondary | ICD-10-CM | POA: Insufficient documentation

## 2023-09-09 DIAGNOSIS — J849 Interstitial pulmonary disease, unspecified: Secondary | ICD-10-CM

## 2023-09-09 HISTORY — PX: RIGHT HEART CATH: CATH118263

## 2023-09-09 LAB — POCT I-STAT EG7
Acid-Base Excess: 2 mmol/L (ref 0.0–2.0)
Acid-Base Excess: 4 mmol/L — ABNORMAL HIGH (ref 0.0–2.0)
Bicarbonate: 28 mmol/L (ref 20.0–28.0)
Bicarbonate: 29.6 mmol/L — ABNORMAL HIGH (ref 20.0–28.0)
Calcium, Ion: 1.23 mmol/L (ref 1.15–1.40)
Calcium, Ion: 1.23 mmol/L (ref 1.15–1.40)
HCT: 46 % (ref 39.0–52.0)
HCT: 46 % (ref 39.0–52.0)
Hemoglobin: 15.6 g/dL (ref 13.0–17.0)
Hemoglobin: 15.6 g/dL (ref 13.0–17.0)
O2 Saturation: 76 %
O2 Saturation: 79 %
Potassium: 4 mmol/L (ref 3.5–5.1)
Potassium: 4.1 mmol/L (ref 3.5–5.1)
Sodium: 135 mmol/L (ref 135–145)
Sodium: 135 mmol/L (ref 135–145)
TCO2: 29 mmol/L (ref 22–32)
TCO2: 31 mmol/L (ref 22–32)
pCO2, Ven: 47 mm[Hg] (ref 44–60)
pCO2, Ven: 48.7 mm[Hg] (ref 44–60)
pH, Ven: 7.383 (ref 7.25–7.43)
pH, Ven: 7.392 (ref 7.25–7.43)
pO2, Ven: 42 mm[Hg] (ref 32–45)
pO2, Ven: 44 mm[Hg] (ref 32–45)

## 2023-09-09 LAB — CBC
HCT: 44.1 % (ref 39.0–52.0)
Hemoglobin: 14.7 g/dL (ref 13.0–17.0)
MCH: 27.1 pg (ref 26.0–34.0)
MCHC: 33.3 g/dL (ref 30.0–36.0)
MCV: 81.2 fL (ref 80.0–100.0)
Platelets: 169 10*3/uL (ref 150–400)
RBC: 5.43 MIL/uL (ref 4.22–5.81)
RDW: 13.1 % (ref 11.5–15.5)
WBC: 10.5 10*3/uL (ref 4.0–10.5)
nRBC: 0 % (ref 0.0–0.2)

## 2023-09-09 LAB — POCT I-STAT, CHEM 8
BUN: 10 mg/dL (ref 8–23)
Calcium, Ion: 1.21 mmol/L (ref 1.15–1.40)
Chloride: 94 mmol/L — ABNORMAL LOW (ref 98–111)
Creatinine, Ser: 0.5 mg/dL — ABNORMAL LOW (ref 0.61–1.24)
Glucose, Bld: 177 mg/dL — ABNORMAL HIGH (ref 70–99)
HCT: 44 % (ref 39.0–52.0)
Hemoglobin: 15 g/dL (ref 13.0–17.0)
Potassium: 4.2 mmol/L (ref 3.5–5.1)
Sodium: 135 mmol/L (ref 135–145)
TCO2: 26 mmol/L (ref 22–32)

## 2023-09-09 LAB — BASIC METABOLIC PANEL
Anion gap: 9 (ref 5–15)
BUN: 9 mg/dL (ref 8–23)
CO2: 27 mmol/L (ref 22–32)
Calcium: 9.1 mg/dL (ref 8.9–10.3)
Chloride: 97 mmol/L — ABNORMAL LOW (ref 98–111)
Creatinine, Ser: 0.55 mg/dL — ABNORMAL LOW (ref 0.61–1.24)
GFR, Estimated: >60 mL/min (ref >60)
Glucose, Bld: 179 mg/dL — ABNORMAL HIGH (ref 70–99)
Potassium: 4.4 mmol/L (ref 3.5–5.1)
Sodium: 133 mmol/L — ABNORMAL LOW (ref 135–145)

## 2023-09-09 SURGERY — RIGHT HEART CATH
Anesthesia: LOCAL

## 2023-09-09 MED ORDER — ACETAMINOPHEN 325 MG PO TABS: 650.0000 mg | ORAL_TABLET | ORAL | Status: DC | PRN

## 2023-09-09 MED ORDER — LABETALOL HCL 5 MG/ML IV SOLN: 10.0000 mg | INTRAVENOUS | Status: DC | PRN

## 2023-09-09 MED ORDER — SODIUM CHLORIDE 0.9% FLUSH: 10.0000 mL | Freq: Two times a day (BID) | INTRAVENOUS | Status: DC

## 2023-09-09 MED ORDER — LIDOCAINE HCL (PF) 1 % IJ SOLN
INTRAMUSCULAR | Status: DC | PRN
  Administered 2023-09-09: 2 mL

## 2023-09-09 MED ORDER — ONDANSETRON HCL 4 MG/2ML IJ SOLN: 4.0000 mg | Freq: Four times a day (QID) | INTRAMUSCULAR | Status: DC | PRN

## 2023-09-09 MED ORDER — HEPARIN (PORCINE) IN NACL 2000-0.9 UNIT/L-% IV SOLN
INTRAVENOUS | Status: DC | PRN
  Administered 2023-09-09: 1000 mL

## 2023-09-09 MED ORDER — HYDRALAZINE HCL 20 MG/ML IJ SOLN: 10.0000 mg | INTRAMUSCULAR | Status: DC | PRN

## 2023-09-09 MED ORDER — SODIUM CHLORIDE 0.9% FLUSH: 3.0000 mL | Freq: Two times a day (BID) | INTRAVENOUS | Status: DC

## 2023-09-09 MED ORDER — SODIUM CHLORIDE 0.9% FLUSH: 3.0000 mL | INTRAVENOUS | Status: DC | PRN

## 2023-09-09 MED ORDER — LIDOCAINE HCL (PF) 1 % IJ SOLN
INTRAMUSCULAR | Status: AC
  Filled 2023-09-09: qty 30

## 2023-09-09 MED ORDER — SODIUM CHLORIDE 0.9 % IV SOLN: 250.0000 mL | INTRAVENOUS | Status: DC | PRN

## 2023-09-09 SURGICAL SUPPLY — 3 items
CATH BALLN WEDGE 5F 110CM (CATHETERS) IMPLANT
PACK CARDIAC CATHETERIZATION (CUSTOM PROCEDURE TRAY) ×1 IMPLANT
SHEATH GLIDE SLENDER 4/5FR (SHEATH) IMPLANT

## 2023-09-09 NOTE — Discharge Instructions (Signed)

## 2023-09-09 NOTE — Interval H&P Note (Signed)
History and Physical Interval Note:  09/09/2023 10:43 AM  Vincent Meyers  has presented today for surgery, with the diagnosis of hp.  The various methods of treatment have been discussed with the patient and family. After consideration of risks, benefits and other options for treatment, the patient has consented to  Procedure(s): RIGHT HEART CATH (N/A) as a surgical intervention.  The patient's history has been reviewed, patient examined, no change in status, stable for surgery.  I have reviewed the patient's chart and labs.  Questions were answered to the patient's satisfaction.     Amorita Vanrossum [employer]

## 2023-09-12 ENCOUNTER — Encounter (HOSPITAL_COMMUNITY): Payer: Self-pay | Admitting: Cardiology

## 2023-09-12 NOTE — Plan of Care (Signed)
[facility] Tonsillectomy/Adenoidectomy, Postoperative PEDS care plan entered in error.

## 2023-09-14 ENCOUNTER — Other Ambulatory Visit (HOSPITAL_COMMUNITY): Payer: Self-pay | Admitting: Pharmacy Technician

## 2023-09-14 ENCOUNTER — Other Ambulatory Visit (HOSPITAL_COMMUNITY): Payer: Self-pay

## 2023-09-14 NOTE — Progress Notes (Signed)
Specialty Pharmacy Refill Coordination Note  Vincent Meyers is a 77 y.o. male contacted today regarding refills of specialty medication(s) Pirfenidone   Patient requested Delivery   Delivery date: 09/16/23   Verified address: [address] [place], [place]   Medication will be filled on 09/15/23.

## 2023-09-19 ENCOUNTER — Encounter: Payer: Self-pay | Admitting: Urology

## 2023-09-19 ENCOUNTER — Ambulatory Visit: Payer: Medicare HMO | Admitting: Urology

## 2023-09-19 VITALS — BP 149/78 | HR 73 | Ht 68.0 in | Wt 212.0 lb

## 2023-09-19 DIAGNOSIS — L821 Other seborrheic keratosis: Secondary | ICD-10-CM | POA: Diagnosis not present

## 2023-09-19 DIAGNOSIS — N529 Male erectile dysfunction, unspecified: Secondary | ICD-10-CM | POA: Diagnosis not present

## 2023-09-19 DIAGNOSIS — L578 Other skin changes due to chronic exposure to nonionizing radiation: Secondary | ICD-10-CM | POA: Diagnosis not present

## 2023-09-19 DIAGNOSIS — N401 Enlarged prostate with lower urinary tract symptoms: Secondary | ICD-10-CM

## 2023-09-19 DIAGNOSIS — N138 Other obstructive and reflux uropathy: Secondary | ICD-10-CM

## 2023-09-19 LAB — URINALYSIS, ROUTINE W REFLEX MICROSCOPIC
Bilirubin, UA: NEGATIVE
Ketones, UA: NEGATIVE
Leukocytes,UA: NEGATIVE
Nitrite, UA: NEGATIVE
Protein,UA: NEGATIVE
RBC, UA: NEGATIVE
Specific Gravity, UA: 1.025 (ref 1.005–1.030)
Urobilinogen, Ur: 0.2 mg/dL (ref 0.2–1.0)
pH, UA: 6 (ref 5.0–7.5)

## 2023-09-19 LAB — BLADDER SCAN AMB NON-IMAGING

## 2023-09-19 NOTE — Progress Notes (Signed)
Assessment: 1. BPH with obstruction/lower urinary tract symptoms   2. Organic impotence     Plan: I discussed that nocturia is often slow to improve with any type of therapy.  I also discussed that there are multiple possible causes for nocturia some of which are not related to the prostate or bladder. Continue alfuzosin 10 mg daily.  Continue tadalafil 10-20 mg prn intercourse.  Voiding diary prior to next visit if his nocturia continues Return to office in 1 month.  Chief Complaint:  Chief Complaint  Patient presents with   Benign Prostatic Hypertrophy    History of Present Illness:  Vincent Meyers is a 77 y.o. male who is seen for further evaluation of lower urinary tract symptoms and erectile dysfunction.  He has a 10-year history of lower urinary tract symptoms with some gradual worsening.  He reported frequency voiding every 2 hours, nocturia x 4, hesitancy, intermittent stream, weak stream, and postvoid dribbling.  No dysuria or gross hematuria. IPSS = 23 QOL = 4. He was previously on tadalafil 5 mg daily but did not see any significant improvement in his urinary symptoms.  No other medical therapy. PSA from 7/21: 1.52 PSA from 9/24:  3.5 PVR = 187 ml. He was given a trial of alfuzosin 10 mg daily in 9/24.  He also reported a several year history of erectile dysfunction.  He is able to achieve a partial erection but has rapid detumescence.  No pain or curvature with erections.  No decrease in his libido.  He did not see any significant improvement in his erectile dysfunction while on the daily tadalafil.  He was using sildenafil 20 mg as needed which improved his erections but he had significant flushing after taking the medication.  He was given a trial of tadalafil 20 mg as needed intercourse.  He returns today for follow-up. He has only taken the alfuzosin for approximately 1 week.  He initially started taking medication and had a severe case of diarrhea so stopped it  due to to a possible side effect.  Since resuming the medication, he has not had any problems with diarrhea.  He is unsure if he has seen an improvement in his symptoms.  He does feel like his stream may be better and he may be emptying more completely.  He continues to have nocturia x 4 and daytime frequency.  No dysuria or gross hematuria. IPSS = 21 QOL = 3 today  Portions of the above documentation were copied from a prior visit for review purposes only.   Past Medical History:  Past Medical History:  Diagnosis Date   Acid reflux    Angina pectoris (HCC)    Anxiety    Arthritis    Coronary artery disease    Erectile dysfunction    Hyperlipidemia    Hypertension    IBS (irritable bowel syndrome)    ILD (interstitial lung disease) (HCC)    OSA (obstructive sleep apnea)     Past Surgical History:  Past Surgical History:  Procedure Laterality Date   APPENDECTOMY     COLON SURGERY     perforated bowel and hernia repair   ILIAC ARTERY ANEURYSM REPAIR Left 2006   Dr. Arbie Cookey (redo surgery)   REPLACEMENT TOTAL KNEE BILATERAL     RIGHT HEART CATH N/A 09/09/2023   Procedure: RIGHT HEART CATH;  Surgeon: Laurey Morale, MD;  Location: [facility] INVASIVE CV LAB;  Service: Cardiovascular;  Laterality: N/A;   TONSILLECTOMY  age 18    Allergies:  No Known Allergies  Family History:  Family History  Problem Relation Age of Onset   Parkinson's disease Mother    Heart attack Father    Stroke Father    Alzheimer's disease Father    Alzheimer's disease Sister    Other Sister        degenerative muscle disease   Hypertension Daughter    Obesity Daughter    Heart murmur Daughter    Colon cancer Neg Hx    Esophageal cancer Neg Hx    Rectal cancer Neg Hx    Stomach cancer Neg Hx     Social History:  Social History   Tobacco Use   Smoking status: Former    Current packs/day: 0.00    Average packs/day: 2.0 packs/day for 23.0 years (46.0 ttl pk-yrs)    Types: Cigarettes    Start  date: 1966    Quit date: 1989    Years since quitting: 35.8   Smokeless tobacco: Never  Vaping Use   Vaping status: Never Used  Substance Use Topics   Alcohol use: Not Currently    Alcohol/week: 2.0 standard drinks of alcohol    Types: 2 Standard drinks or equivalent per week    Comment: quit 7 months ago stated 12/23/22   Drug use: Never    ROS: Constitutional:  Negative for fever, chills, weight loss CV: Negative for chest pain, previous MI, hypertension Respiratory:  Negative for shortness of breath, wheezing, sleep apnea, frequent cough GI:  Negative for nausea, vomiting, bloody stool, GERD  Physical exam: BP (!) 149/78   Pulse 73   Ht 5\' 8"  (1.727 m)   Wt 212 lb (96.2 kg)   BMI 32.23 kg/m  GENERAL APPEARANCE:  Well appearing, well developed, well nourished, NAD HEENT:  Atraumatic, normocephalic, oropharynx clear NECK:  Supple without lymphadenopathy or thyromegaly ABDOMEN:  Soft, non-tender, no masses EXTREMITIES:  Moves all extremities well, without clubbing, cyanosis, or edema NEUROLOGIC:  Alert and oriented x 3, normal gait, CN II-XII grossly intact MENTAL STATUS:  appropriate BACK:  Non-tender to palpation, No CVAT SKIN:  Warm, dry, and intact   Results: U/A: 1+ glucose  PVR: 24 ml

## 2023-09-28 ENCOUNTER — Other Ambulatory Visit: Payer: Self-pay | Admitting: Family Medicine

## 2023-10-02 ENCOUNTER — Other Ambulatory Visit: Payer: Self-pay | Admitting: Gastroenterology

## 2023-10-03 ENCOUNTER — Telehealth (HOSPITAL_COMMUNITY): Payer: Self-pay

## 2023-10-03 NOTE — Telephone Encounter (Signed)
Pt insurance is active and benefits verified through [facility] Co-pay 0, DED 0/0 met, out of pocket $3,600/$262.95 met, co-insurance 0%. no pre-authorization required, [employer] 10/03/2023@2 :51, REF# [id number]

## 2023-10-05 ENCOUNTER — Telehealth (HOSPITAL_COMMUNITY): Payer: Self-pay

## 2023-10-05 ENCOUNTER — Encounter (HOSPITAL_COMMUNITY): Payer: Self-pay

## 2023-10-05 NOTE — Telephone Encounter (Signed)
Attempted to call patient in regards to Pulmonary Rehab - LM on VM Mailed letter 

## 2023-10-06 ENCOUNTER — Telehealth (HOSPITAL_COMMUNITY): Payer: Self-pay

## 2023-10-06 ENCOUNTER — Other Ambulatory Visit: Payer: Self-pay | Admitting: Cardiovascular Disease

## 2023-10-06 NOTE — Telephone Encounter (Signed)
Pt returned PR phone call and stated he is interested in PR. Patient will come in for orientation on 10/07/23 @ 1PM and will attend the 10:15AM exercise class.

## 2023-10-07 ENCOUNTER — Encounter (HOSPITAL_COMMUNITY)
Admission: RE | Admit: 2023-10-07 | Discharge: 2023-10-07 | Disposition: A | Discharge status: Home / Self Care | Payer: Medicare HMO | Source: Ambulatory Visit | Attending: Internal Medicine | Admitting: Internal Medicine

## 2023-10-07 ENCOUNTER — Encounter (HOSPITAL_COMMUNITY): Payer: Self-pay

## 2023-10-07 VITALS — BP 142/70 | HR 73 | Wt 217.8 lb

## 2023-10-07 DIAGNOSIS — J84112 Idiopathic pulmonary fibrosis: Secondary | ICD-10-CM | POA: Insufficient documentation

## 2023-10-07 NOTE — Progress Notes (Signed)
Pulmonary Individual Treatment Plan  Patient Details  Name: Vincent Meyers MRN: [id number] Date of Birth: 1946-08-02 Referring Provider:   Doristine Devoid Pulmonary Rehab Walk Test from 10/07/2023 in [facility]  Referring Provider Ramaswamy       Initial Encounter Date:  Flowsheet Row Pulmonary Rehab Walk Test from 10/07/2023 in [facility]  Date 10/07/23       Visit Diagnosis: IPF (idiopathic pulmonary fibrosis) (HCC)  Patient's Home Medications on Admission:   Current Outpatient Medications:    alfuzosin (UROXATRAL) 10 MG 24 hr tablet, Take 1 tablet (10 mg total) by mouth daily., Disp: 30 tablet, Rfl: 11   amLODipine (NORVASC) 5 MG tablet, TAKE 1 TABLET (5 MG TOTAL) BY MOUTH DAILY., Disp: 90 tablet, Rfl: 2   aspirin EC 81 MG tablet, Take 81 mg by mouth daily., Disp: , Rfl:    atenolol (TENORMIN) 50 MG tablet, TAKE 1 AND 1/2 TABLETS DAILY BY MOUTH, Disp: 135 tablet, Rfl: 1   FIBER PO, Take 4 capsules by mouth 2 (two) times daily., Disp: , Rfl:    Flaxseed, Linseed, (FLAXSEED OIL PO), Take 1 capsule by mouth daily., Disp: , Rfl:    fluticasone (FLONASE) 50 MCG/ACT nasal spray, Place 2 sprays into both nostrils daily., Disp: , Rfl:    hydrochlorothiazide (HYDRODIURIL) 25 MG tablet, TAKE 1 TABLET BY MOUTH EVERY DAY, Disp: 90 tablet, Rfl: 0   lisinopril (ZESTRIL) 40 MG tablet, TAKE 1 TABLET BY MOUTH EVERY DAY, Disp: 90 tablet, Rfl: 1   loperamide (IMODIUM A-D) 2 MG tablet, Take 2-4 mg by mouth 4 (four) times daily as needed for diarrhea or loose stools., Disp: , Rfl:    MAGNESIUM-POTASSIUM PO, Take 1 tablet by mouth daily. With zinc, Disp: , Rfl:    Misc Natural Products (TURMERIC CURCUMIN) CAPS, Take 1 capsule by mouth daily., Disp: , Rfl:    omeprazole (PRILOSEC) 20 MG capsule, TAKE 1 CAPSULE BY MOUTH EVERY DAY, Disp: 90 capsule, Rfl: 1   Pirfenidone 801 MG TABS, Take  1 tablet (801 mg total) by mouth 3 (three) times daily with meals., Disp: 270 tablet, Rfl: 5   simvastatin (ZOCOR) 40 MG tablet, TAKE 1 TABLET(40 MG) BY MOUTH EVERY NIGHT, Disp: 90 tablet, Rfl: 1   tadalafil (CIALIS) 20 MG tablet, Take 1 tablet (20 mg total) by mouth daily as needed., Disp: 10 tablet, Rfl: 11  Past Medical History: Past Medical History:  Diagnosis Date   Acid reflux    Angina pectoris (HCC)    Anxiety    Arthritis    Coronary artery disease    Erectile dysfunction    Hyperlipidemia    Hypertension    IBS (irritable bowel syndrome)    ILD (interstitial lung disease) (HCC)    OSA (obstructive sleep apnea)     Tobacco Use: Social History   Tobacco Use  Smoking Status Former   Current packs/day: 0.00   Average packs/day: 2.0 packs/day for 23.0 years (46.0 ttl pk-yrs)   Types: Cigarettes   Start date: 33   Quit date: 1989   Years since quitting: 35.8  Smokeless Tobacco Never    Labs: Review Flowsheet  More data exists      Latest Ref Rng & Units 04/08/2022 10/19/2022 04/26/2023 07/29/2023 09/09/2023  Labs for ITP Cardiac and Pulmonary Rehab  Cholestrol 100 - 199 mg/dL 102  - - - -  LDL (calc)  0 - 99 mg/dL 56  - - - -  HDL-C >60 mg/dL 31  - - - -  Trlycerides 0 - 149 mg/dL 454  - - - -  Hemoglobin A1c 4.0 - 5.6 % 6.3  7.0  6.9  7.0  -  Bicarbonate 20.0 - 28.0 mmol/L - - - - 28.0  29.6   TCO2 22 - 32 mmol/L - - - - 29  31  26    O2 Saturation % - - - - 76  79     Details       Multiple values from one day are sorted in reverse-chronological order         Capillary Blood Glucose: No results found for: "GLUCAP"   Pulmonary Assessment Scores:  Pulmonary Assessment Scores     Row Name 10/07/23 1429         ADL UCSD   ADL Phase Entry     SOB Score total 44       CAT Score   CAT Score 23       mMRC Score   mMRC Score 2             UCSD: Self-administered rating of dyspnea associated with activities of daily living (ADLs) 6-point  scale (0 = "not at all" to 5 = "maximal or unable to do because of breathlessness")  Scoring Scores range from 0 to 120.  Minimally important difference is 5 units  CAT: CAT can identify the health impairment of COPD patients and is better correlated with disease progression.  CAT has a scoring range of zero to 40. The CAT score is classified into four groups of low (less than 10), medium (10 - 20), high (21-30) and very high (31-40) based on the impact level of disease on health status. A CAT score over 10 suggests significant symptoms.  A worsening CAT score could be explained by an exacerbation, poor medication adherence, poor inhaler technique, or progression of COPD or comorbid conditions.  CAT MCID is 2 points  mMRC: mMRC (Modified Medical Research Council) Dyspnea Scale is used to assess the degree of baseline functional disability in patients of respiratory disease due to dyspnea. No minimal important difference is established. A decrease in score of 1 point or greater is considered a positive change.   Pulmonary Function Assessment:  Pulmonary Function Assessment - 10/07/23 1408       Breath   Bilateral Breath Sounds Rales;Basilar    Shortness of Breath Yes;Limiting activity             Exercise Target Goals: Exercise Program Goal: Individual exercise prescription set using results from initial 6 min walk test and THRR while considering  patient's activity barriers and safety.   Exercise Prescription Goal: Initial exercise prescription builds to 30-45 minutes a day of aerobic activity, 2-3 days per week.  Home exercise guidelines will be given to patient during program as part of exercise prescription that the participant will acknowledge.  Activity Barriers & Risk Stratification:  Activity Barriers & Cardiac Risk Stratification - 10/07/23 1359       Activity Barriers & Cardiac Risk Stratification   Activity Barriers Deconditioning;Muscular Weakness;Shortness of  Breath;Arthritis;Balance Concerns;Joint Problems;Left Knee Replacement;Right Knee Replacement    Cardiac Risk Stratification Moderate             6 Minute Walk:  6 Minute Walk     Row Name 10/07/23 1457         6 Minute Walk  Phase Initial     Distance 905 feet     Walk Time 6 minutes     # of Rest Breaks 0     MPH 1.71     METS 1.98     RPE 11     Perceived Dyspnea  2     VO2 Peak 6.94     Symptoms No     Resting HR 73 bpm     Resting BP 142/70     Resting Oxygen Saturation  96 %     Exercise Oxygen Saturation  during 6 min walk 89 %     Max Ex. HR 123 bpm     Max Ex. BP 152/68     2 Minute Post BP 138/68       Interval HR   1 Minute HR 80     2 Minute HR 109     3 Minute HR 104     4 Minute HR 123     5 Minute HR 105     6 Minute HR 102     2 Minute Post HR 71     Interval Heart Rate? Yes       Interval Oxygen   Interval Oxygen? Yes     Baseline Oxygen Saturation % 96 %     1 Minute Oxygen Saturation % 94 %     1 Minute Liters of Oxygen 2 L     2 Minute Oxygen Saturation % 90 %     2 Minute Liters of Oxygen 2 L     3 Minute Oxygen Saturation % 89 %     3 Minute Liters of Oxygen 2 L     4 Minute Oxygen Saturation % 89 %     4 Minute Liters of Oxygen 2 L     5 Minute Oxygen Saturation % 89 %     5 Minute Liters of Oxygen 2 L     6 Minute Oxygen Saturation % 91 %     6 Minute Liters of Oxygen 2 L     2 Minute Post Oxygen Saturation % 97 %     2 Minute Post Liters of Oxygen 2 L              Oxygen Initial Assessment:  Oxygen Initial Assessment - 10/07/23 1403       Home Oxygen   Home Oxygen Device Portable Concentrator;Home Concentrator    Sleep Oxygen Prescription CPAP    Liters per minute 2    Home Exercise Oxygen Prescription Pulsed    Liters per minute 4    Home Resting Oxygen Prescription Continuous    Liters per minute 2    Compliance with Home Oxygen Use Yes      Initial 6 min Walk   Oxygen Used Continuous    Liters per minute  2      Program Oxygen Prescription   Program Oxygen Prescription Continuous    Liters per minute 2      Intervention   Short Term Goals To learn and exhibit compliance with exercise, home and travel O2 prescription;To learn and understand importance of monitoring SPO2 with pulse oximeter and demonstrate accurate use of the pulse oximeter.;To learn and understand importance of maintaining oxygen saturations>88%;To learn and demonstrate proper pursed lip breathing techniques or other breathing techniques. ;To learn and demonstrate proper use of respiratory medications    Long  Term Goals Exhibits compliance with exercise, home  and travel  O2 prescription;Maintenance of O2 saturations>88%;Compliance with respiratory medication;Verbalizes importance of monitoring SPO2 with pulse oximeter and return demonstration;Exhibits proper breathing techniques, such as pursed lip breathing or other method taught during program session;Demonstrates proper use of MDI's             Oxygen Re-Evaluation:   Oxygen Discharge (Final Oxygen Re-Evaluation):   Initial Exercise Prescription:  Initial Exercise Prescription - 10/07/23 1500       Date of Initial Exercise RX and Referring Provider   Date 10/07/23    Referring Provider Ramaswamy    Expected Discharge Date 12/29/23      Oxygen   Oxygen Continuous    Liters 2    Maintain Oxygen Saturation 88% or higher      Recumbant Elliptical   Level 2    RPM 40    Watts 20    Minutes 15    METs 2      Track   Minutes 15    METs 2      Prescription Details   Frequency (times per week) 2    Duration Progress to 30 minutes of continuous aerobic without signs/symptoms of physical distress      Intensity   THRR 40-80% of Max Heartrate 58-115    Ratings of Perceived Exertion 11-13    Perceived Dyspnea 0-4      Progression   Progression Continue to progress workloads to maintain intensity without signs/symptoms of physical distress.       Resistance Training   Training Prescription Yes    Weight blue bands    Reps 10-15             Perform Capillary Blood Glucose checks as needed.  Exercise Prescription Changes:   Exercise Comments:   Exercise Goals and Review:   Exercise Goals     Row Name 10/07/23 1403             Exercise Goals   Increase Physical Activity Yes       Intervention Provide advice, education, support and counseling about physical activity/exercise needs.;Develop an individualized exercise prescription for aerobic and resistive training based on initial evaluation findings, risk stratification, comorbidities and participant's personal goals.       Expected Outcomes Short Term: Attend rehab on a regular basis to increase amount of physical activity.;Long Term: Add in home exercise to make exercise part of routine and to increase amount of physical activity.;Long Term: Exercising regularly at least 3-5 days a week.       Increase Strength and Stamina Yes       Intervention Provide advice, education, support and counseling about physical activity/exercise needs.;Develop an individualized exercise prescription for aerobic and resistive training based on initial evaluation findings, risk stratification, comorbidities and participant's personal goals.       Expected Outcomes Short Term: Increase workloads from initial exercise prescription for resistance, speed, and METs.;Short Term: Perform resistance training exercises routinely during rehab and add in resistance training at home;Long Term: Improve cardiorespiratory fitness, muscular endurance and strength as measured by increased METs and functional capacity ( )       Able to understand and use rate of perceived exertion (RPE) scale Yes       Intervention Provide education and explanation on how to use RPE scale       Expected Outcomes Short Term: Able to use RPE daily in rehab to express subjective intensity level;Long Term:  Able to use RPE to  guide intensity level when exercising independently  Able to understand and use Dyspnea scale Yes       Intervention Provide education and explanation on how to use Dyspnea scale       Expected Outcomes Short Term: Able to use Dyspnea scale daily in rehab to express subjective sense of shortness of breath during exertion;Long Term: Able to use Dyspnea scale to guide intensity level when exercising independently       Knowledge and understanding of Target Heart Rate Range (THRR) Yes       Intervention Provide education and explanation of THRR including how the numbers were predicted and where they are located for reference       Expected Outcomes Long Term: Able to use THRR to govern intensity when exercising independently;Short Term: Able to state/look up THRR;Short Term: Able to use daily as guideline for intensity in rehab       Understanding of Exercise Prescription Yes       Intervention Provide education, explanation, and written materials on patient's individual exercise prescription       Expected Outcomes Short Term: Able to explain program exercise prescription;Long Term: Able to explain home exercise prescription to exercise independently                Exercise Goals Re-Evaluation :   Discharge Exercise Prescription (Final Exercise Prescription Changes):   Nutrition:  Target Goals: Understanding of nutrition guidelines, daily intake of sodium 1500mg , cholesterol 200mg , calories 30% from fat and 7% or less from saturated fats, daily to have 5 or more servings of fruits and vegetables.  Biometrics:  Pre Biometrics - 10/07/23 1319       Pre Biometrics   Grip Strength 30 kg              Nutrition Therapy Plan and Nutrition Goals:   Nutrition Assessments:  MEDIFICTS Score Key: >=70 Need to make dietary changes  40-70 Heart Healthy Diet <= 40 Therapeutic Level Cholesterol Diet   Picture Your Plate Scores: <74 Unhealthy dietary pattern with much room for  improvement. 41-50 Dietary pattern unlikely to meet recommendations for good health and room for improvement. 51-60 More healthful dietary pattern, with some room for improvement.  >60 Healthy dietary pattern, although there may be some specific behaviors that could be improved.    Nutrition Goals Re-Evaluation:   Nutrition Goals Discharge (Final Nutrition Goals Re-Evaluation):   Psychosocial: Target Goals: Acknowledge presence or absence of significant depression and/or stress, maximize coping skills, provide positive support system. Participant is able to verbalize types and ability to use techniques and skills needed for reducing stress and depression.  Initial Review & Psychosocial Screening:  Initial Psych Review & Screening - 10/07/23 1346       Initial Review   Current issues with Current Depression;Current Stress Concerns    Source of Stress Concerns Chronic Illness;Unable to participate in former interests or hobbies    Comments Pts initial PHQ9 was 2, but he states he sometimes feels depressed when he thinks about his future.      Family Dynamics   Good Support System? Yes    Comments Pts significant other and family provides support      Barriers   Psychosocial barriers to participate in program The patient should benefit from training in stress management and relaxation.      Screening Interventions   Interventions Encouraged to exercise;To provide support and resources with identified psychosocial needs    Expected Outcomes Short Term goal: Utilizing psychosocial counselor, staff and physician to assist  with identification of specific Stressors or current issues interfering with healing process. Setting desired goal for each stressor or current issue identified.;Long Term Goal: Stressors or current issues are controlled or eliminated.;Short Term goal: Identification and review with participant of any Quality of Life or Depression concerns found by scoring the  questionnaire.;Long Term goal: The participant improves quality of Life and PHQ9 Scores as seen by post scores and/or verbalization of changes             Quality of Life Scores:  Scores of 19 and below usually indicate a poorer quality of life in these areas.  A difference of  2-3 points is a clinically meaningful difference.  A difference of 2-3 points in the total score of the Quality of Life Index has been associated with significant improvement in overall quality of life, self-image, physical symptoms, and general health in studies assessing change in quality of life.  PHQ-9: Review Flowsheet  More data exists      10/07/2023 07/29/2023 04/26/2023 01/04/2023 10/19/2022  Depression screen PHQ 2/9  Decreased Interest 0 0 2 0 0  Down, Depressed, Hopeless 0 1 1 0 0  PHQ - 2 Score 0 1 3 0 0  Altered sleeping 1 3 3  - 0  Tired, decreased energy 0 3 3 - 0  Change in appetite 0 0 2 - 0  Feeling bad or failure about yourself  1 0 0 - 0  Trouble concentrating 0 0 0 - 0  Moving slowly or fidgety/restless 0 0 0 - 0  Suicidal thoughts 0 0 0 - 0  PHQ-9 Score 2 7 11  - 0  Difficult doing work/chores Somewhat difficult - Somewhat difficult - Not difficult at all    Details           Interpretation of Total Score  Total Score Depression Severity:  1-4 = Minimal depression, 5-9 = Mild depression, 10-14 = Moderate depression, 15-19 = Moderately severe depression, 20-27 = Severe depression   Psychosocial Evaluation and Intervention:  Psychosocial Evaluation - 10/07/23 1352       Psychosocial Evaluation & Interventions   Interventions Stress management education;Relaxation education;Encouraged to exercise with the program and follow exercise prescription    Comments Vincent Meyers states he feels depressed sometimes when he thinks about his future. 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 He denies needing psychotropic meds and a referral to a therapist at this time.     Expected Outcomes For Vincent Meyers to participate in PR free of any psychosocial barriers or concerns.    Continue Psychosocial Services  Follow up required by staff             Psychosocial Re-Evaluation:   Psychosocial Discharge (Final Psychosocial Re-Evaluation):   Education: Education Goals: Education classes will be provided on a weekly basis, covering required topics. Participant will state understanding/return demonstration of topics presented.  Learning Barriers/Preferences:  Learning Barriers/Preferences - 10/07/23 1352       Learning Barriers/Preferences   Learning Barriers Sight;Hearing    Learning Preferences Written Material;Group Instruction;Individual Instruction             Education Topics: Know Your Numbers Group instruction that is supported by a PowerPoint presentation. Instructor discusses importance of knowing and understanding resting, exercise, and post-exercise oxygen saturation, heart rate, and blood pressure. Oxygen saturation, heart rate, blood pressure, rating of perceived exertion, and dyspnea are reviewed along with a normal range for these values.    Exercise  for the Pulmonary Patient Group instruction that is supported by a PowerPoint presentation. Instructor discusses benefits of exercise, core components of exercise, frequency, duration, and intensity of an exercise routine, importance of utilizing pulse oximetry during exercise, safety while exercising, and options of places to exercise outside of rehab.    MET Level  Group instruction provided by PowerPoint, verbal discussion, and written material to support subject matter. Instructor reviews what METs are and how to increase METs.    Pulmonary Medications Verbally interactive group education provided by instructor with focus on inhaled medications and proper administration.   Anatomy and Physiology of the Respiratory System Group instruction provided by PowerPoint, verbal discussion, and  written material to support subject matter. Instructor reviews respiratory cycle and anatomical components of the respiratory system and their functions. Instructor also reviews differences in obstructive and restrictive respiratory diseases with examples of each.    Oxygen Safety Group instruction provided by PowerPoint, verbal discussion, and written material to support subject matter. There is an overview of "What is Oxygen" and "Why do we need it".  Instructor also reviews how to create a safe environment for oxygen use, the importance of using oxygen as prescribed, and the risks of noncompliance. There is a brief discussion on traveling with oxygen and resources the patient may utilize.   Oxygen Use Group instruction provided by PowerPoint, verbal discussion, and written material to discuss how supplemental oxygen is prescribed and different types of oxygen supply systems. Resources for more information are provided.    Breathing Techniques Group instruction that is supported by demonstration and informational handouts. Instructor discusses the benefits of pursed lip and diaphragmatic breathing and detailed demonstration on how to perform both.     Risk Factor Reduction Group instruction that is supported by a PowerPoint presentation. Instructor discusses the definition of a risk factor, different risk factors for pulmonary disease, and how the heart and lungs work together.   Pulmonary Diseases Group instruction provided by PowerPoint, verbal discussion, and written material to support subject matter. Instructor gives an overview of the different type of pulmonary diseases. There is also a discussion on risk factors and symptoms as well as ways to manage the diseases.   Stress and Energy Conservation Group instruction provided by PowerPoint, verbal discussion, and written material to support subject matter. Instructor gives an overview of stress and the impact it can have on the body.  Instructor also reviews ways to reduce stress. There is also a discussion on energy conservation and ways to conserve energy throughout the day.   Warning Signs and Symptoms Group instruction provided by PowerPoint, verbal discussion, and written material to support subject matter. Instructor reviews warning signs and symptoms of stroke, heart attack, cold and flu. Instructor also reviews ways to prevent the spread of infection.   Other Education Group or individual verbal, written, or video instructions that support the educational goals of the pulmonary rehab program.    Knowledge Questionnaire Score:  Knowledge Questionnaire Score - 10/07/23 1431       Knowledge Questionnaire Score   Pre Score 15/18             Core Components/Risk Factors/Patient Goals at Admission:  Personal Goals and Risk Factors at Admission - 10/07/23 1354       Core Components/Risk Factors/Patient Goals on Admission    Weight Management Yes;Weight Loss    Intervention Weight Management: Develop a combined nutrition and exercise program designed to reach desired caloric intake, while maintaining appropriate intake of nutrient  and fiber, sodium and fats, and appropriate energy expenditure required for the weight goal.;Weight Management: Provide education and appropriate resources to help participant work on and attain dietary goals.;Weight Management/Obesity: Establish reasonable short term and long term weight goals.;Obesity: Provide education and appropriate resources to help participant work on and attain dietary goals.    Expected Outcomes Short Term: Continue to assess and modify interventions until short term weight is achieved;Long Term: Adherence to nutrition and physical activity/exercise program aimed toward attainment of established weight goal;Weight Loss: Understanding of general recommendations for a balanced deficit meal plan, which promotes 1-2 lb weight loss per week and includes a negative  energy balance of [phone] kcal/d;Understanding recommendations for meals to include 15-35% energy as protein, 25-35% energy from fat, 35-60% energy from carbohydrates, less than 200mg  of dietary cholesterol, 20-35 gm of total fiber daily;Understanding of distribution of calorie intake throughout the day with the consumption of 4-5 meals/snacks    Improve shortness of breath with ADL's Yes    Intervention Provide education, individualized exercise plan and daily activity instruction to help decrease symptoms of SOB with activities of daily living.    Expected Outcomes Short Term: Improve cardiorespiratory fitness to achieve a reduction of symptoms when performing ADLs;Long Term: Be able to perform more ADLs without symptoms or delay the onset of symptoms             Core Components/Risk Factors/Patient Goals Review:    Core Components/Risk Factors/Patient Goals at Discharge (Final Review):    ITP Comments:   Comments: Dr. Mechele Collin is Medical Director for Pulmonary Rehab at [facility].

## 2023-10-07 NOTE — Progress Notes (Signed)
Vincent Meyers 77 y.o. male Pulmonary Rehab Orientation Note This patient who was referred to Pulmonary Rehab by Dr. Marchelle Gearing with the diagnosis of IPF arrived today in Cardiac and Pulmonary Rehab. He arrived ambulatory with normal gait. He does carry portable oxygen. [employer] is the provider for their DME. Per patient, Vincent Meyers uses oxygen continuously. Color good, skin warm and dry. Patient is oriented to time and place. Patient's medical history, psychosocial health, and medications reviewed. Psychosocial assessment reveals patient lives with his significant other. Vincent Meyers is currently retired. Patient hobbies include listening to music, traveling and building car motors. Patient reports his stress level is low. Areas of stress/anxiety include health. Patient does exhibit signs of depression. Signs of depression include sadness and difficulty maintaining sleep. PHQ2/9 score 0/2. Vincent Meyers shows good  coping skills with positive outlook on life. Offered emotional support and reassurance. Will continue to monitor. Physical assessment performed by Nurse pick: Vincent Hart RN. Please see their orientation physical assessment note. [facility] reports he  does take medications as prescribed. Patient states he  follows a regular  diet. The patient reports no specific efforts to gain or lose weight.. Patient's weight will be monitored closely. Demonstration and practice of PLB using pulse oximeter. Vincent Meyers able to return demonstration satisfactorily. Safety and hand hygiene in the exercise area reviewed with patient. Vincent Meyers voices understanding of the information reviewed. Department expectations discussed with patient and achievable goals were set. The patient shows enthusiasm about attending the program and we look forward to working with Vincent Meyers. Vincent Meyers completed a 6 min walk test today and is scheduled to begin exercise on 11/19@10 :15   1300-1450 Vincent Meyers, BSRT

## 2023-10-07 NOTE — Progress Notes (Signed)
Pulmonary Rehab Orientation Physical Assessment Note  Physical assessment reveals patient is alert and oriented x 4. Heart rate is normal, breath sounds crackles to BLL to auscultation, no wheezes or rhonchi. Pt denies chronic cough. Bowel sounds present x4 quads.  Pt denies abdominal discomfort, nausea, vomiting, diarrhea or constipation. Grip strength equal, strong. Distal pulses +3; no swelling to lower extremities.   Essie Hart, RN, BSN

## 2023-10-11 ENCOUNTER — Encounter (HOSPITAL_COMMUNITY)
Admission: RE | Admit: 2023-10-11 | Discharge: 2023-10-11 | Disposition: A | Discharge status: Home / Self Care | Payer: Medicare HMO | Source: Ambulatory Visit | Attending: Internal Medicine

## 2023-10-11 DIAGNOSIS — J84112 Idiopathic pulmonary fibrosis: Secondary | ICD-10-CM | POA: Diagnosis not present

## 2023-10-11 NOTE — Progress Notes (Signed)
Daily Session Note  Patient Details  Name: Vincent Meyers MRN: [id number] Date of Birth: Nov 21, 1946 Referring Provider:   Doristine Devoid Pulmonary Rehab Walk Test from 10/07/2023 in [facility]  Referring Provider Ramaswamy       Encounter Date: 10/11/2023  Check In:  Session Check In - 10/11/23 1106       Check-In   Supervising physician immediately available to respond to emergencies [facility] MD immediately available    Physician(s) Bernadene Person, NP    Location [facility]    Staff Present Raford Pitcher, MS, ACSM-CEP, Exercise Physiologist;Kaelin Holford Gerre Scull, RN, BSN;Casey Katrinka Blazing, RT;Randi Reeve BS, ACSM-CEP, Exercise Physiologist    Virtual Visit No    Medication changes reported     No    Fall or balance concerns reported    No    Tobacco Cessation No Change    Warm-up and Cool-down Not performed (comment)    Resistance Training Performed Yes    VAD Patient? No    PAD/SET Patient? No      Pain Assessment   Currently in Pain? No/denies    Multiple Pain Sites No             Capillary Blood Glucose: No results found for this or any previous visit (from the past 24 hour(s)).    Social History   Tobacco Use  Smoking Status Former   Current packs/day: 0.00   Average packs/day: 2.0 packs/day for 23.0 years (46.0 ttl pk-yrs)   Types: Cigarettes   Start date: 66   Quit date: 1989   Years since quitting: 35.9  Smokeless Tobacco Never    Goals Met:  Proper associated with RPD/PD & O2 Sat Independence with exercise equipment Exercise tolerated well No report of concerns or symptoms today Strength training completed today  Goals Unmet:  Not Applicable  Comments: Service time is from 1012 to 1136.   Dr. Mechele Collin is Medical Director for Pulmonary Rehab at [facility].

## 2023-10-13 ENCOUNTER — Other Ambulatory Visit: Payer: Self-pay

## 2023-10-13 ENCOUNTER — Encounter (HOSPITAL_COMMUNITY)
Admission: RE | Admit: 2023-10-13 | Discharge: 2023-10-13 | Disposition: A | Discharge status: Home / Self Care | Payer: Medicare HMO | Source: Ambulatory Visit | Attending: Internal Medicine | Admitting: Internal Medicine

## 2023-10-13 VITALS — Wt 218.0 lb

## 2023-10-13 DIAGNOSIS — J84112 Idiopathic pulmonary fibrosis: Secondary | ICD-10-CM | POA: Diagnosis not present

## 2023-10-13 NOTE — Progress Notes (Signed)
Specialty Pharmacy Refill Coordination Note  Vincent Meyers is a 77 y.o. male contacted today regarding refills of specialty medication(s) Pirfenidone   Patient requested Pickup at [facility] at [place] date: 10/14/23   Medication will be filled on 10/14/23.

## 2023-10-13 NOTE — Progress Notes (Signed)
Daily Session Note  Patient Details  Name: Vincent Meyers MRN: [id number] Date of Birth: 03/09/1946 Referring Provider:   Doristine Devoid Pulmonary Rehab Walk Test from 10/07/2023 in [facility]  Referring Provider Ramaswamy       Encounter Date: 10/13/2023  Check In:  Session Check In - 10/13/23 1034       Check-In   Supervising physician immediately available to respond to emergencies [facility] MD immediately available    Physician(s) Reather Littler, NP    Location [facility]    Staff Present Raford Pitcher, MS, ACSM-CEP, Exercise Physiologist;Mary Gerre Scull, RN, BSN;Mazzie Brodrick Katrinka Blazing, RT;Randi Reeve BS, ACSM-CEP, Exercise Physiologist;Samantha Belarus, RD, LDN    Virtual Visit No    Medication changes reported     No    Fall or balance concerns reported    No    Tobacco Cessation No Change    Warm-up and Cool-down Performed as group-led instruction    Resistance Training Performed Yes    VAD Patient? No    PAD/SET Patient? No      Pain Assessment   Currently in Pain? No/denies    Multiple Pain Sites No             Capillary Blood Glucose: No results found for this or any previous visit (from the past 24 hour(s)).    Social History   Tobacco Use  Smoking Status Former   Current packs/day: 0.00   Average packs/day: 2.0 packs/day for 23.0 years (46.0 ttl pk-yrs)   Types: Cigarettes   Start date: 24   Quit date: 1989   Years since quitting: 35.9  Smokeless Tobacco Never    Goals Met:  Proper associated with RPD/PD & O2 Sat Independence with exercise equipment Exercise tolerated well No report of concerns or symptoms today Strength training completed today  Goals Unmet:  Not Applicable  Comments: Service time is from 1011 to 1145.    Dr. Mechele Collin is Medical Director for Pulmonary Rehab at [facility].

## 2023-10-14 ENCOUNTER — Ambulatory Visit (INDEPENDENT_AMBULATORY_CARE_PROVIDER_SITE_OTHER): Payer: Medicare HMO | Admitting: Urology

## 2023-10-14 ENCOUNTER — Encounter: Payer: Self-pay | Admitting: Urology

## 2023-10-14 ENCOUNTER — Other Ambulatory Visit: Payer: Self-pay

## 2023-10-14 ENCOUNTER — Other Ambulatory Visit (HOSPITAL_COMMUNITY): Payer: Self-pay

## 2023-10-14 VITALS — BP 138/70 | HR 73 | Ht 67.0 in | Wt 218.0 lb

## 2023-10-14 DIAGNOSIS — N401 Enlarged prostate with lower urinary tract symptoms: Secondary | ICD-10-CM | POA: Diagnosis not present

## 2023-10-14 DIAGNOSIS — N138 Other obstructive and reflux uropathy: Secondary | ICD-10-CM

## 2023-10-14 DIAGNOSIS — R3589 Other polyuria: Secondary | ICD-10-CM

## 2023-10-14 DIAGNOSIS — N529 Male erectile dysfunction, unspecified: Secondary | ICD-10-CM

## 2023-10-14 LAB — URINALYSIS, ROUTINE W REFLEX MICROSCOPIC
Bilirubin, UA: NEGATIVE
Glucose, UA: NEGATIVE
Ketones, UA: NEGATIVE
Leukocytes,UA: NEGATIVE
Nitrite, UA: NEGATIVE
Protein,UA: NEGATIVE
RBC, UA: NEGATIVE
Specific Gravity, UA: 1.02 (ref 1.005–1.030)
Urobilinogen, Ur: 1 mg/dL (ref 0.2–1.0)
pH, UA: 6.5 (ref 5.0–7.5)

## 2023-10-14 LAB — BLADDER SCAN AMB NON-IMAGING

## 2023-10-14 NOTE — Progress Notes (Signed)
Assessment: 1. BPH with obstruction/lower urinary tract symptoms   2. Organic impotence   3. Polyuria     Plan: Although he did not bring the voiding diary with him for review, he reports that he is taking in an excessive amount of fluids.  This is likely the cause of his significant urinary frequency and nocturia. BMP today. Recommend evaluation by his PCP for possible medical causes of polydipsia and polyuria. Continue alfuzosin 10 mg daily.  Continue tadalafil 10-20 mg prn intercourse.  Return to office in 6 weeks  Chief Complaint:  Chief Complaint  Patient presents with   Benign Prostatic Hypertrophy    History of Present Illness:  Vincent Meyers is a 77 y.o. male who is seen for further evaluation of lower urinary tract symptoms and erectile dysfunction.  He has a 10-year history of lower urinary tract symptoms with some gradual worsening.  He reported frequency voiding every 2 hours, nocturia x 4, hesitancy, intermittent stream, weak stream, and postvoid dribbling.  No dysuria or gross hematuria. IPSS = 23 QOL = 4. He was previously on tadalafil 5 mg daily but did not see any significant improvement in his urinary symptoms.  No other medical therapy. PSA from 7/21: 1.52 PSA from 9/24:  3.5 PVR = 187 ml. He was given a trial of alfuzosin 10 mg daily in 9/24.  He also reported a several year history of erectile dysfunction.  He is able to achieve a partial erection but has rapid detumescence.  No pain or curvature with erections.  No decrease in his libido.  He did not see any significant improvement in his erectile dysfunction while on the daily tadalafil.  He was using sildenafil 20 mg as needed which improved his erections but he had significant flushing after taking the medication.  He was given a trial of tadalafil 20 mg as needed intercourse.  At his visit in 10/24, he had only taken the alfuzosin for approximately 1 week.  He initially started taking medication and  had a severe case of diarrhea so stopped it due to concerns of possible side effect.  After resuming the medication, he had not had any problems with diarrhea.  He was unsure if there was any improvement in his symptoms.  He felt some improvement in his urine stream and bladder emptying.  He continued to have nocturia x 4 and daytime frequency.  No dysuria or gross hematuria. IPSS = 21 QOL = 3.  He returns today for scheduled follow-up.  He has resumed the alfuzosin.  He continues to report significant urinary symptoms including frequency, intermittency, decreased stream, and nocturia x 5.  He performed a voiding diary but did not bring the forms with him.  He states that he is putting out over 6 L of urine per day and is also taking in 6 L of fluid per day.  He states that he is constantly thirsty. No dysuria or gross hematuria. IPSS = 25 today.  Portions of the above documentation were copied from a prior visit for review purposes only.   Past Medical History:  Past Medical History:  Diagnosis Date   Acid reflux    Angina pectoris (HCC)    Anxiety    Arthritis    Coronary artery disease    Erectile dysfunction    Hyperlipidemia    Hypertension    IBS (irritable bowel syndrome)    ILD (interstitial lung disease) (HCC)    OSA (obstructive sleep apnea)  Past Surgical History:  Past Surgical History:  Procedure Laterality Date   APPENDECTOMY     COLON SURGERY     perforated bowel and hernia repair   ILIAC ARTERY ANEURYSM REPAIR Left 2006   Dr. Arbie Cookey (redo surgery)   REPLACEMENT TOTAL KNEE BILATERAL     RIGHT HEART CATH N/A 09/09/2023   Procedure: RIGHT HEART CATH;  Surgeon: Laurey Morale, MD;  Location[facility] INVASIVE CV LAB;  Service: Cardiovascular;  Laterality: N/A;   TONSILLECTOMY     age 61    Allergies:  No Known Allergies  Family History:  Family History  Problem Relation Age of Onset   Parkinson's disease Mother    Heart attack Father    Stroke Father     Alzheimer's disease Father    Alzheimer's disease Sister    Other Sister        degenerative muscle disease   Hypertension Daughter    Obesity Daughter    Heart murmur Daughter    Colon cancer Neg Hx    Esophageal cancer Neg Hx    Rectal cancer Neg Hx    Stomach cancer Neg Hx     Social History:  Social History   Tobacco Use   Smoking status: Former    Current packs/day: 0.00    Average packs/day: 2.0 packs/day for 23.0 years (46.0 ttl pk-yrs)    Types: Cigarettes    Start date: 1966    Quit date: 1989    Years since quitting: 35.9   Smokeless tobacco: Never  Vaping Use   Vaping status: Never Used  Substance Use Topics   Alcohol use: Not Currently    Alcohol/week: 2.0 standard drinks of alcohol    Types: 2 Standard drinks or equivalent per week    Comment: quit 7 months ago stated 12/23/22   Drug use: Never    ROS: Constitutional:  Negative for fever, chills, weight loss CV: Negative for chest pain, previous MI, hypertension Respiratory:  Negative for shortness of breath, wheezing, sleep apnea, frequent cough GI:  Negative for nausea, vomiting, bloody stool, GERD  Physical exam: BP 138/70   Pulse 73   Ht 5\' 7"  (1.702 m)   Wt 218 lb (98.9 kg)   BMI 34.14 kg/m  GENERAL APPEARANCE:  Well appearing, well developed, well nourished, NAD HEENT:  Atraumatic, normocephalic, oropharynx clear NECK:  Supple without lymphadenopathy or thyromegaly ABDOMEN:  Soft, non-tender, no masses EXTREMITIES:  Moves all extremities well, without clubbing, cyanosis, or edema NEUROLOGIC:  Alert and oriented x 3, normal gait, CN II-XII grossly intact MENTAL STATUS:  appropriate BACK:  Non-tender to palpation, No CVAT SKIN:  Warm, dry, and intact   Results: U/A: Negative, SG 1.020  PVR = 10 ml

## 2023-10-15 LAB — BASIC METABOLIC PANEL
BUN/Creatinine Ratio: 17 (ref 10–24)
BUN: 10 mg/dL (ref 8–27)
CO2: 23 mmol/L (ref 20–29)
Calcium: 9.6 mg/dL (ref 8.6–10.2)
Chloride: 92 mmol/L — ABNORMAL LOW (ref 96–106)
Creatinine, Ser: 0.6 mg/dL — ABNORMAL LOW (ref 0.76–1.27)
Glucose: 225 mg/dL — ABNORMAL HIGH (ref 70–99)
Potassium: 4.9 mmol/L (ref 3.5–5.2)
Sodium: 132 mmol/L — ABNORMAL LOW (ref 134–144)
eGFR: 100 mL/min/{1.73_m2} (ref >59)

## 2023-10-17 ENCOUNTER — Ambulatory Visit (HOSPITAL_BASED_OUTPATIENT_CLINIC_OR_DEPARTMENT_OTHER): Payer: Self-pay | Admitting: Family Medicine

## 2023-10-17 ENCOUNTER — Telehealth (HOSPITAL_BASED_OUTPATIENT_CLINIC_OR_DEPARTMENT_OTHER): Payer: Self-pay | Admitting: *Deleted

## 2023-10-17 NOTE — Telephone Encounter (Signed)
  Chief Complaint: Called to see if follow-up appt is needed.  Symptoms: Symptoms improving after following Urologist recommendations to decrease fluid intake.  Frequency: N/A Pertinent Negatives: Patient denies worsening symtoms Disposition: [] ED /[] Urgent Care (no appt availability in office) / [] Appointment(In office/virtual)/ []  [facility]/ [] Home Care/ [] Refused Recommended Disposition /[] [facility] [place] [facility]/ []  Follow-up with PCP Additional Notes: Patient seen by urologist on 11/22 and was advised to decrease fluid intake. Pt sts that he is feeling better after doing so and wanted to know if he still needed to be seen. Per chart review, lab results are in from 11/22 and show abnormal UA and BMP with a note from Urologist to further review with Dr. [name], Pt agreeable to schedule appt for 10/27/23 at 1:50pm as he is unable to come in t his week due to holiday travel.       Reason for Disposition  Caller requesting routine or non-urgent lab result  Answer Assessment - Initial Assessment Questions 1. REASON FOR CALL or QUESTION: "What is your reason for calling today?" or "How can I best help you?" or "What question do you have that I can help answer?"     Patient called to see if his lab results were in from his urologist and to see if he still needed to be seen.  Answer Assessment - Initial Assessment Questions 1. REASON FOR CALL or QUESTION: "What is your reason for calling today?" or "How can I best help you?" or "What question do you have that I can help answer?"     Patient wanted to know if hs lab results were in and if he still needed to be seen sinc he is feeling better  2. CALLER: Document the source of call. (e.g., laboratory, patient).     Patient  Protocols used: Information Only Call - No Triage-A-AH, PCP Call - No Triage-A-AH

## 2023-10-17 NOTE — Telephone Encounter (Signed)
-----   Message from [name] sent at 10/14/2023  1:26 PM EST ----- Please contact patient to schedule office visit ----- Message ----- From: Milderd Meager., MD Sent: 10/14/2023  12:57 PM EST To: Raymond J [name], MD  I saw Mr. Vincent Meyers today for follow-up of his urinary symptoms.  He is having significant frequency and nocturia.  I asked him to complete a voiding diary at his last visit and we discussed this today.  He reports that he is drinking 6 liters of fluid a day and his urinary output is around 6 liters.  When questioned about his fluid intake, he reported that he is constantly thirsty.  I advised him that this level of fluid intake is abnormal. I checked a BMP today. I recommended that he contact your office for further evaluation for possible causes of his polydipsia and polyuria.  Thanks,  [employer]

## 2023-10-17 NOTE — Telephone Encounter (Signed)
LVM for pt to call the office to schedule appt.

## 2023-10-17 NOTE — Telephone Encounter (Deleted)
Answer Assessment - Initial Assessment Questions 1. REASON FOR CALL or QUESTION: "What is your reason for calling today?" or "How can I best help you?" or "What question do you have that I can help answer?"     Patient called to see if his lab results were in from his urologist and to see if he still needed to be seen.  Protocols used: Information Only Call - No Triage-A-AH

## 2023-10-17 NOTE — Telephone Encounter (Signed)
-----   Message from [name] sent at 10/14/2023  1:26 PM EST ----- Please contact patient to schedule office visit ----- Message ----- From: Milderd Meager., MD Sent: 10/14/2023  12:57 PM EST To: Raymond J [name], MD  I saw Vincent Meyers today for follow-up of his urinary symptoms.  He is having significant frequency and nocturia.  I asked him to complete a voiding diary at his last visit and we discussed this today.  He reports that he is drinking 6 liters of fluid a day and his urinary output is around 6 liters.  When questioned about his fluid intake, he reported that he is constantly thirsty.  I advised him that this level of fluid intake is abnormal. I checked a BMP today. I recommended that he contact your office for further evaluation for possible causes of his polydipsia and polyuria.  Thanks,  [employer]

## 2023-10-17 NOTE — Telephone Encounter (Signed)
Please see below message and advise.

## 2023-10-17 NOTE — Telephone Encounter (Signed)
Pt scheduled 1/25

## 2023-10-17 NOTE — Telephone Encounter (Signed)
Copied from CRM [id number]. Topic: Clinical - Medical Advice >> Oct 14, 2023 11:36 AM Lorin Glass B wrote: Reason for CRM: Patient called in stating he had a visit with urologist Dr Pete Glatter and he will be communicating with Dr Tommi Rumps [place] regarding his results. Patient is anxious to know if he can be advised of whether or not he should leave town on Monday once his bloodwork and visit notes are reviewed by Dr Tommi Rumps [place].

## 2023-10-17 NOTE — Telephone Encounter (Signed)
Copied from CRM [id number]. Topic: Clinical - Medical Advice >> Oct 17, 2023  8:04 AM Conni Elliot wrote: Reason for CRM: pt would like to speak to NP in regards to further evaluation appt that has to be scheduled, pt states he is doing better after reducing intake   08:14- Called -patient to follow-up on upcoming appt. No answer, left voicemail.

## 2023-10-18 ENCOUNTER — Encounter (HOSPITAL_COMMUNITY): Payer: Self-pay

## 2023-10-18 ENCOUNTER — Encounter (HOSPITAL_COMMUNITY): Payer: Medicare HMO

## 2023-10-19 NOTE — Progress Notes (Signed)
Pulmonary Individual Treatment Plan  Patient Details  Name: Vincent Meyers MRN: [id number] Date of Birth: [birth year] Referring Provider:   Doristine Devoid Pulmonary Rehab Walk Test from 10/07/2023 in [facility]  Referring Provider Ramaswamy       Initial Encounter Date:  Flowsheet Row Pulmonary Rehab Walk Test from 10/07/2023 in [facility]  Date 10/07/23       Visit Diagnosis: IPF (idiopathic pulmonary fibrosis) (HCC)  Patient's Home Medications on Admission:   Current Outpatient Medications:    alfuzosin (UROXATRAL) 10 MG 24 hr tablet, Take 1 tablet (10 mg total) by mouth daily., Disp: 30 tablet, Rfl: 11   amLODipine (NORVASC) 5 MG tablet, TAKE 1 TABLET (5 MG TOTAL) BY MOUTH DAILY., Disp: 90 tablet, Rfl: 2   aspirin EC 81 MG tablet, Take 81 mg by mouth daily., Disp: , Rfl:    atenolol (TENORMIN) 50 MG tablet, TAKE 1 AND 1/2 TABLETS DAILY BY MOUTH, Disp: 135 tablet, Rfl: 1   FIBER PO, Take 4 capsules by mouth 2 (two) times daily., Disp: , Rfl:    Flaxseed, Linseed, (FLAXSEED OIL PO), Take 1 capsule by mouth daily., Disp: , Rfl:    fluticasone (FLONASE) 50 MCG/ACT nasal spray, Place 2 sprays into both nostrils daily., Disp: , Rfl:    hydrochlorothiazide (HYDRODIURIL) 25 MG tablet, TAKE 1 TABLET BY MOUTH EVERY DAY, Disp: 90 tablet, Rfl: 0   lisinopril (ZESTRIL) 40 MG tablet, TAKE 1 TABLET BY MOUTH EVERY DAY, Disp: 90 tablet, Rfl: 1   loperamide (IMODIUM A-D) 2 MG tablet, Take 2-4 mg by mouth 4 (four) times daily as needed for diarrhea or loose stools., Disp: , Rfl:    MAGNESIUM-POTASSIUM PO, Take 1 tablet by mouth daily. With zinc, Disp: , Rfl:    Misc Natural Products (TURMERIC CURCUMIN) CAPS, Take 1 capsule by mouth daily., Disp: , Rfl:    omeprazole (PRILOSEC) 20 MG capsule, TAKE 1 CAPSULE BY MOUTH EVERY DAY, Disp: 90 capsule, Rfl: 1   Pirfenidone 801 MG TABS, Take  1 tablet (801 mg total) by mouth 3 (three) times daily with meals., Disp: 270 tablet, Rfl: 5   simvastatin (ZOCOR) 40 MG tablet, TAKE 1 TABLET(40 MG) BY MOUTH EVERY NIGHT, Disp: 90 tablet, Rfl: 1   tadalafil (CIALIS) 20 MG tablet, Take 1 tablet (20 mg total) by mouth daily as needed., Disp: 10 tablet, Rfl: 11  Past Medical History: Past Medical History:  Diagnosis Date   Acid reflux    Angina pectoris (HCC)    Anxiety    Arthritis    Coronary artery disease    Erectile dysfunction    Hyperlipidemia    Hypertension    IBS (irritable bowel syndrome)    ILD (interstitial lung disease) (HCC)    OSA (obstructive sleep apnea)     Tobacco Use: Social History   Tobacco Use  Smoking Status Former   Current packs/day: 0.00   Average packs/day: 2.0 packs/day for 23.0 years (46.0 ttl pk-yrs)   Types: Cigarettes   Start date: 12   Quit date: 1989   Years since quitting: 35.9  Smokeless Tobacco Never    Labs: Review Flowsheet  More data exists      Latest Ref Rng & Units 04/08/2022 10/19/2022 04/26/2023 07/29/2023 09/09/2023  Labs for ITP Cardiac and Pulmonary Rehab  Cholestrol 100 - 199 mg/dL 478  - - - -  LDL (calc)  0 - 99 mg/dL 56  - - - -  HDL-C >29 mg/dL 31  - - - -  Trlycerides 0 - 149 mg/dL 562  - - - -  Hemoglobin A1c 4.0 - 5.6 % 6.3  7.0  6.9  7.0  -  Bicarbonate 20.0 - 28.0 mmol/L - - - - 28.0  29.6   TCO2 22 - 32 mmol/L - - - - 29  31  26    O2 Saturation % - - - - 76  79     Details       Multiple values from one day are sorted in reverse-chronological order         Capillary Blood Glucose: No results found for: "GLUCAP"   Pulmonary Assessment Scores:  Pulmonary Assessment Scores     Row Name 10/07/23 1429         ADL UCSD   ADL Phase Entry     SOB Score total 44       CAT Score   CAT Score 23       mMRC Score   mMRC Score 2             UCSD: Self-administered rating of dyspnea associated with activities of daily living (ADLs) 6-point  scale (0 = "not at all" to 5 = "maximal or unable to do because of breathlessness")  Scoring Scores range from 0 to 120.  Minimally important difference is 5 units  CAT: CAT can identify the health impairment of COPD patients and is better correlated with disease progression.  CAT has a scoring range of zero to 40. The CAT score is classified into four groups of low (less than 10), medium (10 - 20), high (21-30) and very high (31-40) based on the impact level of disease on health status. A CAT score over 10 suggests significant symptoms.  A worsening CAT score could be explained by an exacerbation, poor medication adherence, poor inhaler technique, or progression of COPD or comorbid conditions.  CAT MCID is 2 points  mMRC: mMRC (Modified Medical Research Council) Dyspnea Scale is used to assess the degree of baseline functional disability in patients of respiratory disease due to dyspnea. No minimal important difference is established. A decrease in score of 1 point or greater is considered a positive change.   Pulmonary Function Assessment:  Pulmonary Function Assessment - 10/07/23 1408       Breath   Bilateral Breath Sounds Rales;Basilar    Shortness of Breath Yes;Limiting activity             Exercise Target Goals: Exercise Program Goal: Individual exercise prescription set using results from initial 6 min walk test and THRR while considering  patient's activity barriers and safety.   Exercise Prescription Goal: Initial exercise prescription builds to 30-45 minutes a day of aerobic activity, 2-3 days per week.  Home exercise guidelines will be given to patient during program as part of exercise prescription that the participant will acknowledge.  Activity Barriers & Risk Stratification:  Activity Barriers & Cardiac Risk Stratification - 10/07/23 1359       Activity Barriers & Cardiac Risk Stratification   Activity Barriers Deconditioning;Muscular Weakness;Shortness of  Breath;Arthritis;Balance Concerns;Joint Problems;Left Knee Replacement;Right Knee Replacement    Cardiac Risk Stratification Moderate             6 Minute Walk:  6 Minute Walk     Row Name 10/07/23 1457         6 Minute Walk  Phase Initial     Distance 905 feet     Walk Time 6 minutes     # of Rest Breaks 0     MPH 1.71     METS 1.98     RPE 11     Perceived Dyspnea  2     VO2 Peak 6.94     Symptoms No     Resting HR 73 bpm     Resting BP 142/70     Resting Oxygen Saturation  96 %     Exercise Oxygen Saturation  during 6 min walk 89 %     Max Ex. HR 123 bpm     Max Ex. BP 152/68     2 Minute Post BP 138/68       Interval HR   1 Minute HR 80     2 Minute HR 109     3 Minute HR 104     4 Minute HR 123     5 Minute HR 105     6 Minute HR 102     2 Minute Post HR 71     Interval Heart Rate? Yes       Interval Oxygen   Interval Oxygen? Yes     Baseline Oxygen Saturation % 96 %     1 Minute Oxygen Saturation % 94 %     1 Minute Liters of Oxygen 2 L     2 Minute Oxygen Saturation % 90 %     2 Minute Liters of Oxygen 2 L     3 Minute Oxygen Saturation % 89 %     3 Minute Liters of Oxygen 2 L     4 Minute Oxygen Saturation % 89 %     4 Minute Liters of Oxygen 2 L     5 Minute Oxygen Saturation % 89 %     5 Minute Liters of Oxygen 2 L     6 Minute Oxygen Saturation % 91 %     6 Minute Liters of Oxygen 2 L     2 Minute Post Oxygen Saturation % 97 %     2 Minute Post Liters of Oxygen 2 L              Oxygen Initial Assessment:  Oxygen Initial Assessment - 10/07/23 1403       Home Oxygen   Home Oxygen Device Portable Concentrator;Home Concentrator    Sleep Oxygen Prescription CPAP    Liters per minute 2    Home Exercise Oxygen Prescription Pulsed    Liters per minute 4    Home Resting Oxygen Prescription Continuous    Liters per minute 2    Compliance with Home Oxygen Use Yes      Initial 6 min Walk   Oxygen Used Continuous    Liters per minute  2      Program Oxygen Prescription   Program Oxygen Prescription Continuous    Liters per minute 2      Intervention   Short Term Goals To learn and exhibit compliance with exercise, home and travel O2 prescription;To learn and understand importance of monitoring SPO2 with pulse oximeter and demonstrate accurate use of the pulse oximeter.;To learn and understand importance of maintaining oxygen saturations>88%;To learn and demonstrate proper pursed lip breathing techniques or other breathing techniques. ;To learn and demonstrate proper use of respiratory medications    Long  Term Goals Exhibits compliance with exercise, home  and travel  O2 prescription;Maintenance of O2 saturations>88%;Compliance with respiratory medication;Verbalizes importance of monitoring SPO2 with pulse oximeter and return demonstration;Exhibits proper breathing techniques, such as pursed lip breathing or other method taught during program session;Demonstrates proper use of MDI's             Oxygen Re-Evaluation:  Oxygen Re-Evaluation     Row Name 10/13/23 1605             Program Oxygen Prescription   Program Oxygen Prescription Continuous       Liters per minute 2         Home Oxygen   Home Oxygen Device Portable Concentrator;Home Concentrator       Sleep Oxygen Prescription CPAP       Liters per minute 2       Home Exercise Oxygen Prescription Pulsed       Liters per minute 4       Home Resting Oxygen Prescription Continuous       Liters per minute 2       Compliance with Home Oxygen Use Yes         Goals/Expected Outcomes   Short Term Goals To learn and exhibit compliance with exercise, home and travel O2 prescription;To learn and understand importance of monitoring SPO2 with pulse oximeter and demonstrate accurate use of the pulse oximeter.;To learn and understand importance of maintaining oxygen saturations>88%;To learn and demonstrate proper pursed lip breathing techniques or other breathing  techniques. ;To learn and demonstrate proper use of respiratory medications       Long  Term Goals Exhibits compliance with exercise, home  and travel O2 prescription;Maintenance of O2 saturations>88%;Compliance with respiratory medication;Verbalizes importance of monitoring SPO2 with pulse oximeter and return demonstration;Exhibits proper breathing techniques, such as pursed lip breathing or other method taught during program session;Demonstrates proper use of MDI's       Goals/Expected Outcomes Compliance and understanding of oxygen saturation monitoring and breathing techniques to decrease shortness of breath.                Oxygen Discharge (Final Oxygen Re-Evaluation):  Oxygen Re-Evaluation - 10/13/23 1605       Program Oxygen Prescription   Program Oxygen Prescription Continuous    Liters per minute 2      Home Oxygen   Home Oxygen Device Portable Concentrator;Home Concentrator    Sleep Oxygen Prescription CPAP    Liters per minute 2    Home Exercise Oxygen Prescription Pulsed    Liters per minute 4    Home Resting Oxygen Prescription Continuous    Liters per minute 2    Compliance with Home Oxygen Use Yes      Goals/Expected Outcomes   Short Term Goals To learn and exhibit compliance with exercise, home and travel O2 prescription;To learn and understand importance of monitoring SPO2 with pulse oximeter and demonstrate accurate use of the pulse oximeter.;To learn and understand importance of maintaining oxygen saturations>88%;To learn and demonstrate proper pursed lip breathing techniques or other breathing techniques. ;To learn and demonstrate proper use of respiratory medications    Long  Term Goals Exhibits compliance with exercise, home  and travel O2 prescription;Maintenance of O2 saturations>88%;Compliance with respiratory medication;Verbalizes importance of monitoring SPO2 with pulse oximeter and return demonstration;Exhibits proper breathing techniques, such as pursed lip  breathing or other method taught during program session;Demonstrates proper use of MDI's    Goals/Expected Outcomes Compliance and understanding of oxygen saturation monitoring and breathing techniques to decrease shortness of breath.  Initial Exercise Prescription:  Initial Exercise Prescription - 10/07/23 1500       Date of Initial Exercise RX and Referring Provider   Date 10/07/23    Referring Provider Ramaswamy    Expected Discharge Date 12/29/23      Oxygen   Oxygen Continuous    Liters 2    Maintain Oxygen Saturation 88% or higher      Recumbant Elliptical   Level 2    RPM 40    Watts 20    Minutes 15    METs 2      Track   Minutes 15    METs 2      Prescription Details   Frequency (times per week) 2    Duration Progress to 30 minutes of continuous aerobic without signs/symptoms of physical distress      Intensity   THRR 40-80% of Max Heartrate 58-115    Ratings of Perceived Exertion 11-13    Perceived Dyspnea 0-4      Progression   Progression Continue to progress workloads to maintain intensity without signs/symptoms of physical distress.      Resistance Training   Training Prescription Yes    Weight blue bands    Reps 10-15             Perform Capillary Blood Glucose checks as needed.  Exercise Prescription Changes:   Exercise Prescription Changes     Row Name 10/13/23 1034             Response to Exercise   Blood Pressure (Admit) 138/58       Blood Pressure (Exit) 122/60       Heart Rate (Admit) 81 bpm       Heart Rate (Exercise) 87 bpm       Heart Rate (Exit) 71 bpm       Oxygen Saturation (Admit) 96 %       Oxygen Saturation (Exercise) 91 %       Oxygen Saturation (Exit) 96 %       Rating of Perceived Exertion (Exercise) 13       Perceived Dyspnea (Exercise) 2       Duration Continue with 30 min of aerobic exercise without signs/symptoms of physical distress.       Intensity THRR unchanged         Progression    Progression Continue to progress workloads to maintain intensity without signs/symptoms of physical distress.         Resistance Training   Training Prescription Yes       Weight blue bands       Reps 10-15       Time 10 Minutes         Oxygen   Oxygen Continuous       Liters 2         Recumbant Elliptical   Level 1       Minutes 15       METs 1.9         Track   Laps 6       Minutes 15       METs 1.92         Oxygen   Maintain Oxygen Saturation 88% or higher                Exercise Comments:   Exercise Comments     Row Name 10/11/23 1555           Exercise Comments  Vincent Meyers has completed first day of exercise. He exercised for 15 min on the track and recumbent elliptical. He averaged 2.23 METs on the track and 1.9 METs at level 1 on the recumbent elliptical. He performed the warmup and cooldown standing without limitations. Discussed METs.                Exercise Goals and Review:   Exercise Goals     Row Name 10/07/23 1403             Exercise Goals   Increase Physical Activity Yes       Intervention Provide advice, education, support and counseling about physical activity/exercise needs.;Develop an individualized exercise prescription for aerobic and resistive training based on initial evaluation findings, risk stratification, comorbidities and participant's personal goals.       Expected Outcomes Short Term: Attend rehab on a regular basis to increase amount of physical activity.;Long Term: Add in home exercise to make exercise part of routine and to increase amount of physical activity.;Long Term: Exercising regularly at least 3-5 days a week.       Increase Strength and Stamina Yes       Intervention Provide advice, education, support and counseling about physical activity/exercise needs.;Develop an individualized exercise prescription for aerobic and resistive training based on initial evaluation findings, risk stratification, comorbidities and  participant's personal goals.       Expected Outcomes Short Term: Increase workloads from initial exercise prescription for resistance, speed, and METs.;Short Term: Perform resistance training exercises routinely during rehab and add in resistance training at home;Long Term: Improve cardiorespiratory fitness, muscular endurance and strength as measured by increased METs and functional capacity ( )       Able to understand and use rate of perceived exertion (RPE) scale Yes       Intervention Provide education and explanation on how to use RPE scale       Expected Outcomes Short Term: Able to use RPE daily in rehab to express subjective intensity level;Long Term:  Able to use RPE to guide intensity level when exercising independently       Able to understand and use Dyspnea scale Yes       Intervention Provide education and explanation on how to use Dyspnea scale       Expected Outcomes Short Term: Able to use Dyspnea scale daily in rehab to express subjective sense of shortness of breath during exertion;Long Term: Able to use Dyspnea scale to guide intensity level when exercising independently       Knowledge and understanding of Target Heart Rate Range (THRR) Yes       Intervention Provide education and explanation of THRR including how the numbers were predicted and where they are located for reference       Expected Outcomes Long Term: Able to use THRR to govern intensity when exercising independently;Short Term: Able to state/look up THRR;Short Term: Able to use daily as guideline for intensity in rehab       Understanding of Exercise Prescription Yes       Intervention Provide education, explanation, and written materials on patient's individual exercise prescription       Expected Outcomes Short Term: Able to explain program exercise prescription;Long Term: Able to explain home exercise prescription to exercise independently                Exercise Goals Re-Evaluation :  Exercise Goals  Re-Evaluation     Row Name 10/13/23 1559  Exercise Goal Re-Evaluation   Exercise Goals Review Increase Physical Activity;Able to understand and use Dyspnea scale;Understanding of Exercise Prescription;Increase Strength and Stamina;Knowledge and understanding of Target Heart Rate Range (THRR);Able to understand and use rate of perceived exertion (RPE) scale       Comments Vincent Meyers has completed 2 exercise sessions. He exercises for 15 min on the track and recumbent elliptical. Vincent Meyers averages 2.08 METs on the track and 1.9 METs at level 1 on the recumbent elliptical. He performs the warmup and cooldown standing wihtout limitations. It is too soon to notate any discernable progressions. Will continue to monitor and progress as able.       Expected Outcomes Through exercise at rehab and home, the patient will decrease shortness of breath with daily activities and feel confident in carrying out an exercise regimen at home.                Discharge Exercise Prescription (Final Exercise Prescription Changes):  Exercise Prescription Changes - 10/13/23 1034       Response to Exercise   Blood Pressure (Admit) 138/58    Blood Pressure (Exit) 122/60    Heart Rate (Admit) 81 bpm    Heart Rate (Exercise) 87 bpm    Heart Rate (Exit) 71 bpm    Oxygen Saturation (Admit) 96 %    Oxygen Saturation (Exercise) 91 %    Oxygen Saturation (Exit) 96 %    Rating of Perceived Exertion (Exercise) 13    Perceived Dyspnea (Exercise) 2    Duration Continue with 30 min of aerobic exercise without signs/symptoms of physical distress.    Intensity THRR unchanged      Progression   Progression Continue to progress workloads to maintain intensity without signs/symptoms of physical distress.      Resistance Training   Training Prescription Yes    Weight blue bands    Reps 10-15    Time 10 Minutes      Oxygen   Oxygen Continuous    Liters 2      Recumbant Elliptical   Level 1    Minutes 15    METs  1.9      Track   Laps 6    Minutes 15    METs 1.92      Oxygen   Maintain Oxygen Saturation 88% or higher             Nutrition:  Target Goals: Understanding of nutrition guidelines, daily intake of sodium 1500mg , cholesterol 200mg , calories 30% from fat and 7% or less from saturated fats, daily to have 5 or more servings of fruits and vegetables.  Biometrics:  Pre Biometrics - 10/07/23 1319       Pre Biometrics   Grip Strength 30 kg              Nutrition Therapy Plan and Nutrition Goals:  Nutrition Therapy & Goals - 10/11/23 1130       Nutrition Therapy   Diet Heart Healthy/ Carbohydrate Consistent diet    Drug/Food Interactions Statins/Certain Fruits      Personal Nutrition Goals   Nutrition Goal Patient to improve diet quality by using the plate method as a guide for meal planning to include lean protein/plant protein, fruits, vegetables, whole grains, nonfat dairy as part of a well-balanced diet.    Personal Goal #2 Patient to continue to follow a low sodium diet 2300mg  per day    Comments Vincent Meyers has medical history of COPD, PAD, Hyperlipidemia, pulmonary fibrosis, DM2,  HTN, CAD, OSA. He does not check his blood sugar regularly. Lipids remain well controlled. He has good knowledge of  low sodium diet, reading food labels for sodium, benefits of small/frequent meals. He enjoys a wide variety of foods including lean protein, fruits, and vegetables. Patient will benefit from participation in pulmonary rehab for nutrition, exercise, and lifestyle modification.      Intervention Plan   Intervention Prescribe, educate and counsel regarding individualized specific dietary modifications aiming towards targeted core components such as weight, hypertension, lipid management, diabetes, heart failure and other comorbidities.;Nutrition handout(s) given to patient.    Expected Outcomes Short Term Goal: Understand basic principles of dietary content, such as calories, fat,  sodium, cholesterol and nutrients.;Long Term Goal: Adherence to prescribed nutrition plan.             Nutrition Assessments:  Nutrition Assessments - 10/11/23 1145       Rate Your Plate Scores   Pre Score 75            MEDIFICTS Score Key: >=70 Need to make dietary changes  40-70 Heart Healthy Diet <= 40 Therapeutic Level Cholesterol Diet  Flowsheet Row PULMONARY REHAB OTHER RESPIRATORY from 10/11/2023 in [facility]  Picture Your Plate Total Score on Admission 75      Picture Your Plate Scores: <76 Unhealthy dietary pattern with much room for improvement. 41-50 Dietary pattern unlikely to meet recommendations for good health and room for improvement. 51-60 More healthful dietary pattern, with some room for improvement.  >60 Healthy dietary pattern, although there may be some specific behaviors that could be improved.    Nutrition Goals Re-Evaluation:  Nutrition Goals Re-Evaluation     Row Name 10/11/23 1130             Goals   Current Weight 217 lb 6 oz (98.6 kg)       Comment A1c 7.0, lipids from 04/08/2022- HDL 31, LDL 56       Expected Outcome Vincent Meyers has medical history of COPD, PAD, Hyperlipidemia, pulmonary fibrosis, DM2, HTN, CAD, OSA. He does not check his blood sugar regularly. Lipids remain well controlled. He has good knowledge of low sodium diet, reading food labels for sodium, benefits of small/frequent meals. He enjoys a wide variety of foods including lean protein, fruits, and vegetables. Patient will benefit from participation in pulmonary rehab for nutrition, exercise, and lifestyle modification.                Nutrition Goals Discharge (Final Nutrition Goals Re-Evaluation):  Nutrition Goals Re-Evaluation - 10/11/23 1130       Goals   Current Weight 217 lb 6 oz (98.6 kg)    Comment A1c 7.0, lipids from 04/08/2022- HDL 31, LDL 56    Expected Outcome Vincent Meyers has medical history of COPD,  PAD, Hyperlipidemia, pulmonary fibrosis, DM2, HTN, CAD, OSA. He does not check his blood sugar regularly. Lipids remain well controlled. He has good knowledge of low sodium diet, reading food labels for sodium, benefits of small/frequent meals. He enjoys a wide variety of foods including lean protein, fruits, and vegetables. Patient will benefit from participation in pulmonary rehab for nutrition, exercise, and lifestyle modification.             Psychosocial: Target Goals: Acknowledge presence or absence of significant depression and/or stress, maximize coping skills, provide positive support system. Participant is able to verbalize types and ability to use techniques and skills needed for  reducing stress and depression.  Initial Review & Psychosocial Screening:  Initial Psych Review & Screening - 10/07/23 1346       Initial Review   Current issues with Current Depression;Current Stress Concerns    Source of Stress Concerns Chronic Illness;Unable to participate in former interests or hobbies    Comments Pts initial PHQ9 was 2, but he states he sometimes feels depressed when he thinks about his future.      Family Dynamics   Good Support System? Yes    Comments Pts significant other and family provides support      Barriers   Psychosocial barriers to participate in program The patient should benefit from training in stress management and relaxation.      Screening Interventions   Interventions Encouraged to exercise;To provide support and resources with identified psychosocial needs    Expected Outcomes Short Term goal: Utilizing psychosocial counselor, staff and physician to assist with identification of specific Stressors or current issues interfering with healing process. Setting desired goal for each stressor or current issue identified.;Long Term Goal: Stressors or current issues are controlled or eliminated.;Short Term goal: Identification and review with participant of any Quality  of Life or Depression concerns found by scoring the questionnaire.;Long Term goal: The participant improves quality of Life and PHQ9 Scores as seen by post scores and/or verbalization of changes             Quality of Life Scores:  Scores of 19 and below usually indicate a poorer quality of life in these areas.  A difference of  2-3 points is a clinically meaningful difference.  A difference of 2-3 points in the total score of the Quality of Life Index has been associated with significant improvement in overall quality of life, self-image, physical symptoms, and general health in studies assessing change in quality of life.  PHQ-9: Review Flowsheet  More data exists      10/07/2023 07/29/2023 04/26/2023 01/04/2023 10/19/2022  Depression screen PHQ 2/9  Decreased Interest 0 0 2 0 0  Down, Depressed, Hopeless 0 1 1 0 0  PHQ - 2 Score 0 1 3 0 0  Altered sleeping 1 3 3  - 0  Tired, decreased energy 0 3 3 - 0  Change in appetite 0 0 2 - 0  Feeling bad or failure about yourself  1 0 0 - 0  Trouble concentrating 0 0 0 - 0  Moving slowly or fidgety/restless 0 0 0 - 0  Suicidal thoughts 0 0 0 - 0  PHQ-9 Score 2 7 11  - 0  Difficult doing work/chores Somewhat difficult - Somewhat difficult - Not difficult at all    Details           Interpretation of Total Score  Total Score Depression Severity:  1-4 = Minimal depression, 5-9 = Mild depression, 10-14 = Moderate depression, 15-19 = Moderately severe depression, 20-27 = Severe depression   Psychosocial Evaluation and Intervention:  Psychosocial Evaluation - 10/07/23 1352       Psychosocial Evaluation & Interventions   Interventions Stress management education;Relaxation education;Encouraged to exercise with the program and follow exercise prescription    Comments Vincent Meyers states he feels depressed sometimes when he thinks about his future. 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 He denies needing psychotropic  meds and a referral to a therapist at this time.    Expected Outcomes For Vincent Meyers to participate in PR free of any psychosocial barriers or  concerns.    Continue Psychosocial Services  Follow up required by staff             Psychosocial Re-Evaluation:  Psychosocial Re-Evaluation     Row Name 10/14/23 1342             Psychosocial Re-Evaluation   Current issues with Current Depression;Current Stress Concerns       Comments Vincent Meyers has only attended two sessions so far. He denies any new psychosocial barriers or concerns at this time.       Expected Outcomes For Vincent Meyers to participate in PR with no depression or stress.       Interventions Encouraged to attend Pulmonary Rehabilitation for the exercise       Continue Psychosocial Services  Follow up required by staff                Psychosocial Discharge (Final Psychosocial Re-Evaluation):  Psychosocial Re-Evaluation - 10/14/23 1342       Psychosocial Re-Evaluation   Current issues with Current Depression;Current Stress Concerns    Comments Vincent Meyers has only attended two sessions so far. He denies any new psychosocial barriers or concerns at this time.    Expected Outcomes For Vincent Meyers to participate in PR with no depression or stress.    Interventions Encouraged to attend Pulmonary Rehabilitation for the exercise    Continue Psychosocial Services  Follow up required by staff             Education: Education Goals: Education classes will be provided on a weekly basis, covering required topics. Participant will state understanding/return demonstration of topics presented.  Learning Barriers/Preferences:  Learning Barriers/Preferences - 10/07/23 1352       Learning Barriers/Preferences   Learning Barriers Sight;Hearing    Learning Preferences Written Material;Group Instruction;Individual Instruction             Education Topics: Know Your Numbers Group instruction that is supported by a PowerPoint presentation. Instructor  discusses importance of knowing and understanding resting, exercise, and post-exercise oxygen saturation, heart rate, and blood pressure. Oxygen saturation, heart rate, blood pressure, rating of perceived exertion, and dyspnea are reviewed along with a normal range for these values.    Exercise for the Pulmonary Patient Group instruction that is supported by a PowerPoint presentation. Instructor discusses benefits of exercise, core components of exercise, frequency, duration, and intensity of an exercise routine, importance of utilizing pulse oximetry during exercise, safety while exercising, and options of places to exercise outside of rehab.    MET Level  Group instruction provided by PowerPoint, verbal discussion, and written material to support subject matter. Instructor reviews what METs are and how to increase METs.    Pulmonary Medications Verbally interactive group education provided by instructor with focus on inhaled medications and proper administration.   Anatomy and Physiology of the Respiratory System Group instruction provided by PowerPoint, verbal discussion, and written material to support subject matter. Instructor reviews respiratory cycle and anatomical components of the respiratory system and their functions. Instructor also reviews differences in obstructive and restrictive respiratory diseases with examples of each.    Oxygen Safety Group instruction provided by PowerPoint, verbal discussion, and written material to support subject matter. There is an overview of "What is Oxygen" and "Why do we need it".  Instructor also reviews how to create a safe environment for oxygen use, the importance of using oxygen as prescribed, and the risks of noncompliance. There is a brief discussion on traveling with oxygen and resources the patient  may utilize.   Oxygen Use Group instruction provided by PowerPoint, verbal discussion, and written material to discuss how supplemental  oxygen is prescribed and different types of oxygen supply systems. Resources for more information are provided.    Breathing Techniques Group instruction that is supported by demonstration and informational handouts. Instructor discusses the benefits of pursed lip and diaphragmatic breathing and detailed demonstration on how to perform both.     Risk Factor Reduction Group instruction that is supported by a PowerPoint presentation. Instructor discusses the definition of a risk factor, different risk factors for pulmonary disease, and how the heart and lungs work together.   Pulmonary Diseases Group instruction provided by PowerPoint, verbal discussion, and written material to support subject matter. Instructor gives an overview of the different type of pulmonary diseases. There is also a discussion on risk factors and symptoms as well as ways to manage the diseases.   Stress and Energy Conservation Group instruction provided by PowerPoint, verbal discussion, and written material to support subject matter. Instructor gives an overview of stress and the impact it can have on the body. Instructor also reviews ways to reduce stress. There is also a discussion on energy conservation and ways to conserve energy throughout the day.   Warning Signs and Symptoms Group instruction provided by PowerPoint, verbal discussion, and written material to support subject matter. Instructor reviews warning signs and symptoms of stroke, heart attack, cold and flu. Instructor also reviews ways to prevent the spread of infection.   Other Education Group or individual verbal, written, or video instructions that support the educational goals of the pulmonary rehab program.    Knowledge Questionnaire Score:  Knowledge Questionnaire Score - 10/07/23 1431       Knowledge Questionnaire Score   Pre Score 15/18             Core Components/Risk Factors/Patient Goals at Admission:  Personal Goals and Risk  Factors at Admission - 10/07/23 1354       Core Components/Risk Factors/Patient Goals on Admission    Weight Management Yes;Weight Loss    Intervention Weight Management: Develop a combined nutrition and exercise program designed to reach desired caloric intake, while maintaining appropriate intake of nutrient and fiber, sodium and fats, and appropriate energy expenditure required for the weight goal.;Weight Management: Provide education and appropriate resources to help participant work on and attain dietary goals.;Weight Management/Obesity: Establish reasonable short term and long term weight goals.;Obesity: Provide education and appropriate resources to help participant work on and attain dietary goals.    Expected Outcomes Short Term: Continue to assess and modify interventions until short term weight is achieved;Long Term: Adherence to nutrition and physical activity/exercise program aimed toward attainment of established weight goal;Weight Loss: Understanding of general recommendations for a balanced deficit meal plan, which promotes 1-2 lb weight loss per week and includes a negative energy balance of [id number] kcal/d;Understanding recommendations for meals to include 15-35% energy as protein, 25-35% energy from fat, 35-60% energy from carbohydrates, less than 200mg  of dietary cholesterol, 20-35 gm of total fiber daily;Understanding of distribution of calorie intake throughout the day with the consumption of 4-5 meals/snacks    Improve shortness of breath with ADL's Yes    Intervention Provide education, individualized exercise plan and daily activity instruction to help decrease symptoms of SOB with activities of daily living.    Expected Outcomes Short Term: Improve cardiorespiratory fitness to achieve a reduction of symptoms when performing ADLs;Long Term: Be able to perform more ADLs without symptoms or  delay the onset of symptoms             Core Components/Risk Factors/Patient Goals  Review:   Goals and Risk Factor Review     Row Name 10/14/23 1345             Core Components/Risk Factors/Patient Goals Review   Personal Goals Review Weight Management/Obesity;Improve shortness of breath with ADL's;Develop more efficient breathing techniques such as purse lipped breathing and diaphragmatic breathing and practicing self-pacing with activity.       Review Vincent Meyers has only attended two sessions so far. Goal progressing for weight loss. Goal progressing for improving shortness of breath with ADL's. Vincent Meyers is requiring 2L of O2 to maintain sats >88% while exercising. Goal progressing for developing more efficient breathing techniques such as purse lipped breathing and diaphragmatic breathing; and practicing self-pacing with activity. We will continue to monitor Vincent Meyers's progress throughout the program.       Expected Outcomes To lose weight, improve shortness of breath with ADL's and develop more efficient breathing techniques such as purse lipped breathing and diaphragmatic breathing; and practicing self-pacing with activity.                Core Components/Risk Factors/Patient Goals at Discharge (Final Review):   Goals and Risk Factor Review - 10/14/23 1345       Core Components/Risk Factors/Patient Goals Review   Personal Goals Review Weight Management/Obesity;Improve shortness of breath with ADL's;Develop more efficient breathing techniques such as purse lipped breathing and diaphragmatic breathing and practicing self-pacing with activity.    Review Vincent Meyers has only attended two sessions so far. Goal progressing for weight loss. Goal progressing for improving shortness of breath with ADL's. Vincent Meyers is requiring 2L of O2 to maintain sats >88% while exercising. Goal progressing for developing more efficient breathing techniques such as purse lipped breathing and diaphragmatic breathing; and practicing self-pacing with activity. We will continue to monitor Vincent Meyers's progress throughout the program.     Expected Outcomes To lose weight, improve shortness of breath with ADL's and develop more efficient breathing techniques such as purse lipped breathing and diaphragmatic breathing; and practicing self-pacing with activity.             ITP Comments: Pt is making expected progress toward Pulmonary Rehab goals after completing 2 session(s). Recommend continued exercise, life style modification, education, and utilization of breathing techniques to increase stamina and strength, while also decreasing shortness of breath with exertion.  Dr. Mechele Collin is Medical Director for Pulmonary Rehab at [facility].

## 2023-10-25 ENCOUNTER — Encounter (HOSPITAL_COMMUNITY): Payer: Medicare HMO

## 2023-10-27 ENCOUNTER — Encounter (HOSPITAL_COMMUNITY)
Admission: RE | Admit: 2023-10-27 | Discharge: 2023-10-27 | Disposition: A | Discharge status: Home / Self Care | Payer: Medicare HMO | Source: Ambulatory Visit | Attending: Internal Medicine | Admitting: Internal Medicine

## 2023-10-27 ENCOUNTER — Ambulatory Visit (HOSPITAL_BASED_OUTPATIENT_CLINIC_OR_DEPARTMENT_OTHER): Payer: Medicare HMO | Admitting: Family Medicine

## 2023-10-27 VITALS — Wt 216.1 lb

## 2023-10-27 DIAGNOSIS — J84112 Idiopathic pulmonary fibrosis: Secondary | ICD-10-CM | POA: Diagnosis not present

## 2023-10-27 NOTE — Progress Notes (Signed)
Daily Session Note  Patient Details  Name: ISLAM DELLACROCE MRN: [id number] Date of Birth: [birth year] Referring Provider:   Doristine Devoid Pulmonary Rehab Walk Test from 10/07/2023 in [facility]  Referring Provider Ramaswamy       Encounter Date: 10/27/2023  Check In:  Session Check In - 10/27/23 1025       Check-In   Supervising physician immediately available to respond to emergencies [facility] MD immediately available    Physician(s) Edd Fabian, NP    Location [facility]    Staff Present Raford Pitcher, MS, ACSM-CEP, Exercise Physiologist;Mary Gerre Scull, RN, BSN;Kenzo Ozment Katrinka Blazing, RT;Randi Reeve BS, ACSM-CEP, Exercise Physiologist    Virtual Visit No    Medication changes reported     No    Fall or balance concerns reported    No    Tobacco Cessation No Change    Warm-up and Cool-down Performed as group-led instruction    Resistance Training Performed Yes    VAD Patient? No    PAD/SET Patient? No      Pain Assessment   Currently in Pain? No/denies    Multiple Pain Sites No             Capillary Blood Glucose: No results found for this or any previous visit (from the past 24 hour(s)).    Social History   Tobacco Use  Smoking Status Former   Current packs/day: 0.00   Average packs/day: 2.0 packs/day for 23.0 years (46.0 ttl pk-yrs)   Types: Cigarettes   Start date: 75   Quit date: 1989   Years since quitting: 35.9  Smokeless Tobacco Never    Goals Met:  Proper associated with RPD/PD & O2 Sat Independence with exercise equipment Exercise tolerated well No report of concerns or symptoms today Strength training completed today  Goals Unmet:  Not Applicable  Comments: Service time is from 1015 to 1140.    Dr. Mechele Collin is Medical Director for Pulmonary Rehab at [facility].

## 2023-10-31 ENCOUNTER — Encounter: Payer: Medicare HMO | Admitting: Internal Medicine

## 2023-10-31 ENCOUNTER — Encounter (HOSPITAL_BASED_OUTPATIENT_CLINIC_OR_DEPARTMENT_OTHER): Payer: Self-pay | Admitting: *Deleted

## 2023-10-31 ENCOUNTER — Ambulatory Visit (INDEPENDENT_AMBULATORY_CARE_PROVIDER_SITE_OTHER): Payer: Medicare HMO | Admitting: Family Medicine

## 2023-10-31 ENCOUNTER — Encounter (HOSPITAL_BASED_OUTPATIENT_CLINIC_OR_DEPARTMENT_OTHER): Payer: Self-pay | Admitting: Family Medicine

## 2023-10-31 VITALS — BP 138/72 | HR 71 | Ht 67.5 in | Wt 215.1 lb

## 2023-10-31 DIAGNOSIS — E1159 Type 2 diabetes mellitus with other circulatory complications: Secondary | ICD-10-CM

## 2023-10-31 DIAGNOSIS — Z006 Encounter for examination for normal comparison and control in clinical research program: Secondary | ICD-10-CM

## 2023-10-31 DIAGNOSIS — E871 Hypo-osmolality and hyponatremia: Secondary | ICD-10-CM | POA: Insufficient documentation

## 2023-10-31 DIAGNOSIS — J84112 Idiopathic pulmonary fibrosis: Secondary | ICD-10-CM

## 2023-10-31 NOTE — Patient Instructions (Signed)
ICD-10-CM   1. Research study patient  Z00.6     2. 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

## 2023-10-31 NOTE — Research (Cosign Needed)
TITLE: A Phase 2, Randomized, Double-Blind, Placebo-Controlled Study to Evaluate the Safety and Efficacy of [id number] in Patients With Idiopathic Pulmonary Fibrosis  Protocol #: [id number] NCT: Sponsor:[employer]  Protocol Version as of 10/31/2023 is 6.0 and dated  19Apr2024  Consent Version as of 10/31/2023 is V5  and dated 31Aug2023 Cytogeneticist as of 10/31/2023 is 8.0  and dated 20Jul2022   Study Design:  This is a randomized, double-blinded, placebo-controlled multicenter study to evaluate the safety and efficacy of [id number] in patients with IPF with or without standard-of-care. 2:1 randomization ratio to [id number] 150 mg BID or the matching placebo for  24 weeks.    Mechanism of Action Proline is one of the largest constituents of collagen. In IPF, there is excessive deposition of collagen. Prolyl-tRNA Synthetase (PRS) is an enzyme that conjugates proline. [id number] (Bersiporocin) is the world's first selective PRS inhibitor that decreases collagen formation and subsequent pro-fibrotic markers.  Administration  A dose of [id number] 150 mg BID will be administered orally in the fasted state or at least 2 hours after the last meal, for 24 weeks.   Key Inclusion Criteria age = 40 years Documented diagnosis of IPF per the 2018 ATS/ERS/JRS/ALAT Clinical Practice Meeting all of the following criteria during the screening period:             FVC >= 40% predicted            DLCOcor >=25% to <= 80%              (FEV1)/FVC ratio >= 0.7             Able to walk at least 150 m in , resting SpO2 should be >= 88% with a maximum of 6L O2/min  On a stable dose of pirfenidone OR nintedanib for at least 3 months OR on neither pirfenidone nor nintedanib.  Key Exclusion Criteria Currently taking medication known as a strong CYP2D6 inhibitor OR taking medication known to be CYP2D6 inducers OR CYP2D6 substrate with narrow therapeutic index. GFR  < 30  mL/min/1.61m2 moderate to severe hepatic impairment (Child-Pugh B and C). Patients with =3upper limit of normal of alanine aminotransferase, aspartate aminotransferase or gamma-glutamyl transpeptidase. Abnormal ECG findings including but not limited to QTc >500 ms.  Pharmacokinetics Urine PK data indicate that renal elimination is not the major clearance pathway for [id number].   Adverse effects and risk Overall, when [id number] was administered concomitantly with Pirfenidone and Nintedanib, [id number] 150 mg enteric-coated tablet was generally well tolerated and safe.   Safety data from edition number [id number], abstracted in March 2023.   Gastrointestinal adverse reactions (e.g Diarrhea, abdominal pain, nausea, vomiting) followed by CNS (headache and dizziness) as below were the most commonly observed adverse reactions.  Most adverse reactions were mild or moderate and reversible.  An improved enteric-coated 150 mg tablet (new) was reformulated to lower the initial dissolution rate in pH 6.8. Enteric-coated tablets (new) demonstrated that nausea and vomiting appear to have decreased. While the incidence rate of diarrhea was slightly increased than old formulation administration, the severity was all mild.  Below table comprises predominantly of enteric coated tablet.  Overall adverse event % n=229 Average of 4 different studies [id number] n=24(part 1) Esbriet combination [id number] n=24(part 2) Ofev combination   Nausea 54 out of 229 23.58% 6(25%) 2(8.33%)  Vomiting 37 out of 205 18.04% 1(4.17%) 1(4.17%)  Diarrhea 37 out of 229 16.15% 2(8.33%) 7(29.17%)  Abdominal pain 7 out of 229 3.05% 0 1(4.17%)  Constipation 4 out of 54  7.4% - -  Abdominal discomfort 4 out of 205  1.95% 0 1(4.17%)  Headache 25 out of 205  12.19% 4(16.67%) 1(4.17%)  Dizziness 8 out of 175 (4.57%) 3(12.50%) 1(4.17%)   Severity of TEAE Table  Overall adverse event % n=229 Average of 4 different studies  [id number] n=24(part 1) Esbriet combination [id number] n=24(part 2) Ofev combination   TEAE - occurrences  382 19 18  Severity- mild 351 (91.88%) 18 (94.73%) 17 (94.44%)   Moderate 31 (8.11%) 1 (5.26%) 1 (5.55%)  Severe 0 (0%) 0 (0%)  0 (0%)     91% TEAEs were mild  (96 of 106 TEAEs, including all 16 TEAEs in the placebo group). 9% (n=10) were moderate.   TEAEs occurred in a dose dependent manner with 7 of the 10 occurring in daily doses >= 600mg   All moderate TEAEs were classed as Gastrointestinal Disorders (nausea, vomiting, diarrhea, and epigastric discomfort), were considered recovered/resolved within 24 hours, and had no sequelae. There were no severe, life-threatening or fatal TEAEs across the study. There were no subjects with serious TEAEs, and there were no subjects with TEAEs leading to IP discontinuation during Part 1 (SAD) of the study.  EKG concerns There were no effects on ECG with dose up to 80 mg/kg in cynomolgus monkeys. In humans no EKG abnormalities reported  except in Part 2 ([id number] administered concomitantly with Nintedanib), one case of PR prolongation LFT concerns In rats, reversible, minimal DWN12088HCl-related centrilobular hepatocyte hypertrophy was noted in the liver administered >=50 mg/kg/day and was considered consistent with DWN12088HCl-related induction of hepatocellular enzymes.  In humans, on investigation in the MAD study of six subjects, hepatic enzyme was increased in one participant (16.7%).   Rare concerns based on animal data - not seen in humans Excessive salivation in rats  at doses >50 mg/kg/day and mildly diminished appetite in 1 monkey was observed on one occasion.  With extremely high doses of 1200 mg/kg decreased activity, increased/labored/shallow respiration, gasping, vocalizing and piloerection was observed in male (but not male) rats 6h post dose.  [facility] Clinical Research Coordinator note:   This visit for  Subject Vincent Meyers with DOB: [birth year] on 10/31/2023 for the above protocol is Visit/Encounter # 1/ Screening  and is for purpose of research.   The consent for this encounter is under Protocol Version 6.0, Investigator Brochure Version 8.0, Consent Version V5 and is currently IRB approved.   Subject expressed continued interest and consent in continuing as a study subject. Subject confirmed that there was no change in contact information (e.g. address, telephone, email). Subject thanked for participation in research and contribution to science. In this visit 10/31/2023 the subject will be evaluated by Principal Investigator named Kalman Shan MD. This research coordinator has verified that the above investigator is up to date with his/her training logs.   The Subject was  informed that the PI  continues to have oversight of the subject's visits and course through relevant discussions, reviews, and also specifically of this visit by routing of this note to the PI.   1. This visit is a key visit of screening. The PI is available for this visit.    2.  In addition, ahead of the key visit of screening the visit and subject were discussed with the PI as part of direct PI oversight.   Signed by  Christell Constant MD  Clinical Research Coordinator [facility]  [place], [place] 9:30 AM 11/10/2023

## 2023-10-31 NOTE — Assessment & Plan Note (Signed)
Most recent hemoglobin A1c remains stable, was still appropriate given patient age and A1c target.  Patient has primarily managed blood sugars without any medications in the past, primarily focusing on lifestyle modifications. Has been having recent issues with polydipsia and polyuria as above. This however has not been that new of an issue for patient. We will recheck hemoglobin A1c today for monitoring.  Did discuss considerations related to recent hemoglobin A1c elevation and if A1c continues to be elevated or further elevated.  We did discuss medication considerations such as metformin or possible GLP-1 receptor agonist.  Would be amenable to starting metformin to assist with controlling blood sugars.  We did discuss potential risks and side effects related to medication.

## 2023-10-31 NOTE — Progress Notes (Unsigned)
    Procedures performed today:    None.  Independent interpretation of notes and tests performed by another provider:   None.  Brief History, Exam, Impression, and Recommendations:    BP 138/72 (BP Location: Right Arm, Patient Position: Sitting, Cuff Size: Normal)   Pulse 71   Ht 5' 7.5" (1.715 m)   Wt 215 lb 1.6 oz (97.6 kg)   SpO2 97%   BMI 33.19 kg/m   There are no diagnoses linked to this encounter.No follow-ups on file.   ___________________________________________ Aiman Sonn [name], MD, ABFM, [facility] Primary Care and Sports Medicine [facility]

## 2023-10-31 NOTE — Progress Notes (Addendum)
TITLE: A Phase 2, Randomized, Double-Blind, Placebo-Controlled Study to Evaluate the Safety and Efficacy of [id number] in Patients With Idiopathic Pulmonary Fibrosis  Protocol #: [id number] NCT: Sponsor:[employer]:  This is a randomized, double-blinded, placebo-controlled multicenter study to evaluate the safety and efficacy of [id number] in patients with IPF with or without standard-of-care. 2:1 randomization ratio to [id number] 150 mg BID or the matching placebo for  24 weeks.    Mechanism of Action Proline is one of the largest constituents of collagen. In IPF, there is excessive deposition of collagen. Prolyl-tRNA Synthetase (PRS) is an enzyme that conjugates proline. [id number] (Bersiporocin) is the world's first selective PRS inhibitor that decreases collagen formation and subsequent pro-fibrotic markers.  Administration  A dose of [id number] 150 mg BID will be administered orally in the fasted state or at least 2 hours after the last meal, for 24 weeks.   Key Inclusion Criteria age = 40 years Documented diagnosis of IPF per the 2018 ATS/ERS/JRS/ALAT Clinical Practice Meeting all of the following criteria during the screening period:             FVC >= 40% predicted            DLCOcor >=25% to <= 80%              (FEV1)/FVC ratio >= 0.7             Able to walk at least 150 m in , resting SpO2 should be >= 88% with a maximum of 6L O2/min  On a stable dose of pirfenidone OR nintedanib for at least 3 months OR on neither pirfenidone nor nintedanib.  Key Exclusion Criteria Currently taking medication known as a strong CYP2D6 inhibitor OR taking medication known to be CYP2D6 inducers OR CYP2D6 substrate with narrow therapeutic index. GFR  < 30 mL/min/1.67m2 moderate to severe hepatic impairment (Child-Pugh B and C). Patients with =3upper limit of normal of alanine aminotransferase, aspartate aminotransferase or gamma-glutamyl  transpeptidase. Abnormal ECG findings including but not limited to QTc >500 ms.  Pharmacokinetics Urine PK data indicate that renal elimination is not the major clearance pathway for [id number].   Adverse effects and risk Overall, when [id number] was administered concomitantly with Pirfenidone and Nintedanib, [id number] 150 mg enteric-coated tablet was generally well tolerated and safe.   Safety data from edition number [id number], abstracted in March 2023.   Gastrointestinal adverse reactions (e.g Diarrhea, abdominal pain, nausea, vomiting) followed by CNS (headache and dizziness) as below were the most commonly observed adverse reactions.  Most adverse reactions were mild or moderate and reversible.  An improved enteric-coated 150 mg tablet (new) was reformulated to lower the initial dissolution rate in pH 6.8. Enteric-coated tablets (new) demonstrated that nausea and vomiting appear to have decreased. While the incidence rate of diarrhea was slightly increased than old formulation administration, the severity was all mild.  Below table comprises predominantly of enteric coated tablet.  Overall adverse event % n=229 Average of 4 different studies [id number] n=24(part 1) Esbriet combination [id number] n=24(part 2) Ofev combination   Nausea 54 out of 229 23.58% 6(25%) 2(8.33%)  Vomiting 37 out of 205 18.04% 1(4.17%) 1(4.17%)  Diarrhea 37 out of 229 16.15% 2(8.33%) 7(29.17%)  Abdominal pain 7 out of 229 3.05% 0 1(4.17%)  Constipation 4 out of 54  7.4% - -  Abdominal discomfort 4 out of 205  1.95% 0 1(4.17%)  Headache 25 out of 205  12.19% 4(16.67%) 1(4.17%)  Dizziness 8 out  of 175 (4.57%) 3(12.50%) 1(4.17%)   Severity of TEAE Table  Overall adverse event % n=229 Average of 4 different studies [id number] n=24(part 1) Esbriet combination [id number] n=24(part 2) Ofev combination   TEAE - occurrences  382 19 18  Severity- mild 351 (91.88%) 18 (94.73%) 17 (94.44%)    Moderate 31 (8.11%) 1 (5.26%) 1 (5.55%)  Severe 0 (0%) 0 (0%)  0 (0%)     91% TEAEs were mild  (96 of 106 TEAEs, including all 16 TEAEs in the placebo group). 9% (n=10) were moderate.   TEAEs occurred in a dose dependent manner with 7 of the 10 occurring in daily doses >= 600mg   All moderate TEAEs were classed as Gastrointestinal Disorders (nausea, vomiting, diarrhea, and epigastric discomfort), were considered recovered/resolved within 24 hours, and had no sequelae. There were no severe, life-threatening or fatal TEAEs across the study. There were no subjects with serious TEAEs, and there were no subjects with TEAEs leading to IP discontinuation during Part 1 (SAD) of the study.  EKG concerns There were no effects on ECG with dose up to 80 mg/kg in cynomolgus monkeys. In humans no EKG abnormalities reported  except in Part 2 ([id number] administered concomitantly with Nintedanib), one case of PR prolongation LFT concerns In rats, reversible, minimal DWN12088HCl-related centrilobular hepatocyte hypertrophy was noted in the liver administered >=50 mg/kg/day and was considered consistent with DWN12088HCl-related induction of hepatocellular enzymes.  In humans, on investigation in the MAD study of six subjects, hepatic enzyme was increased in one participant (16.7%).   Rare concerns based on animal data - not seen in humans Excessive salivation in rats  at doses >50 mg/kg/day and mildly diminished appetite in 1 monkey was observed on one occasion.  With extremely high doses of 1200 mg/kg decreased activity, increased/labored/shallow respiration, gasping, vocalizing and piloerection was observed in male (but not male) rats 6h post dose.  Xxxxxxxxxxxxxxxxxxxxxxxxxxxxx    This visit for Subject Vincent Meyers with DOB: 1946/04/30 on 10/31/2023 for the above protocol is Visit/Encounter # consent  and is for purpose of screening . Subject/LAR expressed continued interest and consent in  continuing as a study subject. Subject thanked for participation in research and contribution to science.    No new neissues  Consent signed and then exam done       Latest Ref Rng & Units 12/09.24 research post consent 09/01/2023    1:52 PM 02/01/2023    8:57 AM 11/26/2021    2:58 PM 11/17/2020    2:54 PM  PFT Results  FVC-Pre L 2.644 2.64  2.84  3.27  P 3.28   FVC-Predicted Pre %  68  74  83  P 82   FVC-Post L   2.87  3.31  P 3.16   FVC-Predicted Post %   75  83  P 79   Pre FEV1/FVC % %  85  72  68  P 83   Post FEV1/FCV % %   69  87  P 85   FEV1-Pre L  2.26  2.04  2.22  P 2.73   FEV1-Predicted Pre %  81  74  77  P 94   FEV1-Post L   1.97  2.89  P 2.69   DLCO uncorrected ml/min/mmHg 9.4 13.35  14.61  15.89  P 16.02   DLCO UNC% %  56  62  66  P 67   DLCO corrected ml/min/mmHg  13.35  14.61  15.89  P 16.02   DLCO COR %Predicted %  56  62  66  P 67   DLVA Predicted %  81  89  94  P 88   TLC L  4.21  4.50  4.78  P 4.36   TLC % Predicted %  63  68  72  P 65   RV % Predicted %  61  64  62  P 47     P Preliminary result       SIGNATURE    Dr. Kalman Shan, M.D., F.C.C.P, ACRP-CPI Pulmonary and Critical Care Medicine Research Investigator, [facility] @ [facility] Health Staff Physician, [facility] Center Director - Interstitial Lung Disease  Program  Pulmonary Fibrosis [facility] [place] [facility] and [facility] @ [facility] [place], [place], [address]   Pager: [phone], If no answer  OR between  19:00-7:00h: page [phone] Telephone (research): [phone]  1:27 PM 10/31/2023   1:27 PM 10/31/2023

## 2023-11-01 ENCOUNTER — Encounter (HOSPITAL_COMMUNITY)
Admission: RE | Admit: 2023-11-01 | Discharge: 2023-11-01 | Disposition: A | Discharge status: Home / Self Care | Payer: Medicare HMO | Source: Ambulatory Visit | Attending: Internal Medicine

## 2023-11-01 VITALS — Wt 216.1 lb

## 2023-11-01 DIAGNOSIS — J84112 Idiopathic pulmonary fibrosis: Secondary | ICD-10-CM

## 2023-11-01 LAB — BASIC METABOLIC PANEL
BUN/Creatinine Ratio: 18 (ref 10–24)
BUN: 10 mg/dL (ref 8–27)
CO2: 26 mmol/L (ref 20–29)
Calcium: 9.8 mg/dL (ref 8.6–10.2)
Chloride: 93 mmol/L — ABNORMAL LOW (ref 96–106)
Creatinine, Ser: 0.56 mg/dL — ABNORMAL LOW (ref 0.76–1.27)
Glucose: 107 mg/dL — ABNORMAL HIGH (ref 70–99)
Potassium: 4.2 mmol/L (ref 3.5–5.2)
Sodium: 134 mmol/L (ref 134–144)
eGFR: 102 mL/min/{1.73_m2} (ref >59)

## 2023-11-01 LAB — OSMOLALITY, URINE: Osmolality, Ur: 259 mosm/kg

## 2023-11-01 LAB — HEMOGLOBIN A1C
Est. average glucose Bld gHb Est-mCnc: 157 mg/dL
Hgb A1c MFr Bld: 7.1 % — ABNORMAL HIGH (ref 4.8–5.6)

## 2023-11-01 LAB — OSMOLALITY: Osmolality Meas: 275 mosm/kg — ABNORMAL LOW (ref 280–301)

## 2023-11-01 LAB — SODIUM, URINE, RANDOM: Sodium, Ur: 62 mmol/L

## 2023-11-01 NOTE — Progress Notes (Deleted)
Daily Session Note  Patient Details  Name: Vincent Meyers MRN: [id number] Date of Birth: [birth year] Referring Provider:   Doristine Devoid Pulmonary Rehab Walk Test from 10/07/2023 in [facility] for [facility], Vascular, & Lung Health  Referring Provider Ramaswamy       Encounter Date: 10/27/2023  Check In:   Capillary Blood Glucose: No results found for this or any previous visit (from the past 24 hour(s)).   Exercise Prescription Changes - 11/01/23 1100       Response to Exercise   Blood Pressure (Admit) 156/58    Blood Pressure (Exercise) 132/66    Blood Pressure (Exit) 128/60    Heart Rate (Admit) 86 bpm    Heart Rate (Exercise) 87 bpm    Heart Rate (Exit) 74 bpm    Oxygen Saturation (Admit) 95 %    Oxygen Saturation (Exercise) 92 %    Oxygen Saturation (Exit) 92 %    Rating of Perceived Exertion (Exercise) 13    Perceived Dyspnea (Exercise) 1    Duration Continue with 30 min of aerobic exercise without signs/symptoms of physical distress.    Intensity THRR unchanged      Progression   Progression Continue to progress workloads to maintain intensity without signs/symptoms of physical distress.      Resistance Training   Training Prescription Yes    Weight blue bands    Reps 10-15    Time 10 Minutes      Oxygen   Oxygen Continuous    Liters 2      Recumbant Elliptical   Level 3    Minutes 15    METs 1.9      Track   Laps 7    Minutes 15    METs 2.08      Oxygen   Maintain Oxygen Saturation 88% or higher             Social History   Tobacco Use  Smoking Status Former   Current packs/day: 0.00   Average packs/day: 2.0 packs/day for 23.0 years (46.0 ttl pk-yrs)   Types: Cigarettes   Start date: 19   Quit date: 1989   Years since quitting: 35.9   Passive exposure: Past  Smokeless Tobacco Never    Goals Met:  Proper associated with RPD/PD & O2 Sat Independence with exercise equipment Exercise tolerated well No  report of concerns or symptoms today Strength training completed today  Goals Unmet:  Not Applicable  Comments: Service time is from 1020 to 1145.    Dr. Mechele Collin is Medical Director for Pulmonary Rehab at [facility].

## 2023-11-01 NOTE — Progress Notes (Signed)
Daily Session Note  Patient Details  Name: NIKAI SCHREYER MRN: [id number] Date of Birth: Sep 04, 1946 Referring Provider:   Doristine Devoid Pulmonary Rehab Walk Test from 10/07/2023 in [facility] for Heart, Vascular, & Lung Health  Referring Provider Ramaswamy       Encounter Date: 11/01/2023  Check In:  Session Check In - 11/01/23 1033       Check-In   Supervising physician immediately available to respond to emergencies [facility] MD immediately available    Physician(s) Carlos Levering, NP    Location [facility]    Staff Present Raford Pitcher, MS, ACSM-CEP, Exercise Physiologist;Mary Gerre Scull, RN, BSN;Apryl Brymer Katrinka Blazing, RT;Randi Reeve BS, ACSM-CEP, Exercise Physiologist    Virtual Visit No    Medication changes reported     No    Fall or balance concerns reported    No    Tobacco Cessation No Change    Warm-up and Cool-down Performed as group-led instruction    Resistance Training Performed Yes    VAD Patient? No    PAD/SET Patient? No      Pain Assessment   Currently in Pain? No/denies    Pain Score 0-No pain    Multiple Pain Sites No             Capillary Blood Glucose: No results found for this or any previous visit (from the past 24 hour(s)).    Social History   Tobacco Use  Smoking Status Former   Current packs/day: 0.00   Average packs/day: 2.0 packs/day for 23.0 years (46.0 ttl pk-yrs)   Types: Cigarettes   Start date: 28   Quit date: 1989   Years since quitting: 35.9   Passive exposure: Past  Smokeless Tobacco Never    Goals Met:  Proper associated with RPD/PD & O2 Sat Independence with exercise equipment Exercise tolerated well No report of concerns or symptoms today Strength training completed today  Goals Unmet:  Not Applicable  Comments: Service time is from 1020 to 1145.    Dr. Mechele Collin is Medical Director for Pulmonary Rehab at [facility].

## 2023-11-02 ENCOUNTER — Ambulatory Visit (INDEPENDENT_AMBULATORY_CARE_PROVIDER_SITE_OTHER): Payer: Medicare HMO | Admitting: Otolaryngology

## 2023-11-02 ENCOUNTER — Encounter (INDEPENDENT_AMBULATORY_CARE_PROVIDER_SITE_OTHER): Payer: Self-pay

## 2023-11-02 ENCOUNTER — Encounter (HOSPITAL_BASED_OUTPATIENT_CLINIC_OR_DEPARTMENT_OTHER): Payer: Self-pay | Admitting: *Deleted

## 2023-11-02 VITALS — Ht 67.0 in | Wt 215.0 lb

## 2023-11-02 DIAGNOSIS — R0981 Nasal congestion: Secondary | ICD-10-CM | POA: Diagnosis not present

## 2023-11-02 DIAGNOSIS — H903 Sensorineural hearing loss, bilateral: Secondary | ICD-10-CM

## 2023-11-02 DIAGNOSIS — H6981 Other specified disorders of Eustachian tube, right ear: Secondary | ICD-10-CM | POA: Diagnosis not present

## 2023-11-02 DIAGNOSIS — H6123 Impacted cerumen, bilateral: Secondary | ICD-10-CM | POA: Insufficient documentation

## 2023-11-02 DIAGNOSIS — J343 Hypertrophy of nasal turbinates: Secondary | ICD-10-CM | POA: Diagnosis not present

## 2023-11-02 DIAGNOSIS — H6121 Impacted cerumen, right ear: Secondary | ICD-10-CM

## 2023-11-02 DIAGNOSIS — J310 Chronic rhinitis: Secondary | ICD-10-CM

## 2023-11-02 DIAGNOSIS — J342 Deviated nasal septum: Secondary | ICD-10-CM | POA: Diagnosis not present

## 2023-11-02 NOTE — Assessment & Plan Note (Signed)
Patient had labs with his urologist and was found to have slightly low sodium level.  Due to this, he was advised by his urologist to follow-up with me to further review this laboratory finding.  It was noted during his visit to the urologist that he was taking in significant amounts of fluids which likely resulted in observed urinary frequency throughout the day and night.  He does not specifically endorse polydipsia, however does note that he frequently drink water throughout the day. On labs with urologist, glucose was fairly elevated, and in correcting sodium level for observed elevation in blood glucose, corrected sodium was found to be at lower end of normal range. Discussed prior lab findings as well as considerations pertaining to management of low sodium.  Today, we can proceed with recheck of BMP.  Will also complete urine studies in order to assess for potential causes of low sodium.  Further evaluation of and recommendations pending results of these labs

## 2023-11-02 NOTE — Progress Notes (Signed)
Patient ID: Vincent Meyers, male   DOB: [birth year], 76 y.o.   MRN: [id number]  Follow-up: Right ear eustachian tube dysfunction, hearing loss, tinnitus, chronic nasal congestion   HPI: The patient is a 77 year old male who returns today for his follow-up evaluation.  The patient was previously seen for multiple medical issues, including chronic right ear eustachian tube dysfunction, right middle ear effusion, hearing loss, tinnitus, chronic nasal congestion, and postnasal drainage.  The patient was treated with Flonase nasal spray and Valsalva exercise.  The patient returns today reporting improvement in his right eustachian tube dysfunction.  He has not noted any change in his hearing.  He is able to perform the Valsalva exercise to auto insufflate his middle ear spaces.  He is still chronically congested nasally.  He also has frequent postnasal drainage.  Currently he is on oxygen via nasal cannula to treat his pulmonary fibrosis.  He denies any otalgia, otorrhea, facial pain, fever, or visual change.  Exam: General: Communicates without difficulty, well nourished, no acute distress. Head: Normocephalic, no evidence injury, no tenderness, facial buttresses intact without stepoff. Face/sinus: No tenderness to palpation and percussion. Facial movement is normal and symmetric. Eyes: PERRL, EOMI. No scleral icterus, conjunctivae clear. Neuro: CN II exam reveals vision grossly intact.  No nystagmus at any point of gaze. Ears: Auricles well formed without lesions.  Right ear cerumen impaction.  The left ear canal and tympanic membrane are normal.  Nose: External evaluation reveals normal support and skin without lesions.  Dorsum is intact.  Anterior rhinoscopy reveals congested mucosa over anterior aspect of inferior turbinates and intact septum.  No purulence noted. Oral:  Oral cavity and oropharynx are intact, symmetric, without erythema or edema.  Mucosa is moist without lesions. Neck: Full range of motion  without pain.  There is no significant lymphadenopathy.  No masses palpable.  Thyroid bed within normal limits to palpation.  Parotid glands and submandibular glands equal bilaterally without mass.  Trachea is midline. Neuro:  CN 2-12 grossly intact.   Procedure:  Flexible Nasal Endoscopy: Description: Risks, benefits, and alternatives of flexible endoscopy were explained to the patient.  Specific mention was made of the risk of throat numbness with difficulty swallowing, possible bleeding from the nose and mouth, and pain from the procedure.  The patient gave oral consent to proceed.  The flexible scope was inserted into the right nasal cavity.  Endoscopy of the interior nasal cavity, superior, inferior, and middle meatus was performed. The sphenoid-ethmoid recess was examined. Edematous mucosa was noted.  No polyp, mass, or lesion was appreciated. Nasal septal deviation noted. Olfactory cleft was clear.  Nasopharynx was clear.  Turbinates were hypertrophied but without mass.  The procedure was repeated on the contralateral side with similar findings.  The patient tolerated the procedure well.    Procedure: Right ear cerumen disimpaction Anesthesia: None Description: Under the operating microscope, the cerumen is carefully removed with a combination of cerumen currette, alligator forceps, and suction catheters.  After the cerumen is removed, the TMs are noted to be normal.  No mass, erythema, or lesions. The patient tolerated the procedure well.    Assessment: 1.  Subjectively improved right ear eustachian tube dysfunction. 2.  Right ear cerumen impaction.  After the disimpaction procedure, both tympanic membranes and middle ear spaces are noted to be normal. 3.  Subjectively stable bilateral high-frequency sensorineural hearing loss. 4.  Chronic rhinitis with nasal mucosal congestion, nasal septal deviation, and bilateral inferior turbinate hypertrophy.  The patient  also has frequent postnasal  drainage.  Plan: 1.  Otomicroscopy with right ear cerumen disimpaction. 2.  The physical exam and nasal endoscopy findings are reviewed with the patient. 3.  Continue Flonase nasal spray 2 sprays each nostril daily.  A refill was given to the patient. 4.  Valsalva exercise as needed. 5.  The patient will return for reevaluation in 1 year, sooner if needed.

## 2023-11-03 ENCOUNTER — Encounter (HOSPITAL_COMMUNITY)
Admission: RE | Admit: 2023-11-03 | Discharge: 2023-11-03 | Disposition: A | Discharge status: Home / Self Care | Payer: Medicare HMO | Source: Ambulatory Visit | Attending: Internal Medicine | Admitting: Internal Medicine

## 2023-11-03 DIAGNOSIS — J84112 Idiopathic pulmonary fibrosis: Secondary | ICD-10-CM

## 2023-11-03 NOTE — Progress Notes (Signed)
Daily Session Note  Patient Details  Name: Vincent Meyers MRN: [id number] Date of Birth: [birth year] Referring Provider:   Doristine Devoid Pulmonary Rehab Walk Test from 10/07/2023 in [facility] for Heart, Vascular, & Lung Health  Referring Provider Ramaswamy       Encounter Date: 11/03/2023  Check In:  Session Check In - 11/03/23 1022       Check-In   Supervising physician immediately available to respond to emergencies [facility] MD immediately available    Physician(s) Joni Reining, NP    [facility]    Staff Present Raford Pitcher, MS, ACSM-CEP, Exercise Physiologist;Mary Gerre Scull, RN, BSN;Casey Katrinka Blazing, RT;Randi Reeve BS, ACSM-CEP, Exercise Physiologist    Virtual Visit No    Medication changes reported     No    Fall or balance concerns reported    No    Tobacco Cessation No Change    Warm-up and Cool-down Performed as group-led instruction    Resistance Training Performed Yes    VAD Patient? No    PAD/SET Patient? No      Pain Assessment   Currently in Pain? No/denies    Multiple Pain Sites No             Capillary Blood Glucose: No results found for this or any previous visit (from the past 24 hours).    Social History   Tobacco Use  Smoking Status Former   Current packs/day: 0.00   Average packs/day: 2.0 packs/day for 23.0 years (46.0 ttl pk-yrs)   Types: Cigarettes   Start date: 25   Quit date: 1989   Years since quitting: 35.9   Passive exposure: Past  Smokeless Tobacco Never    Goals Met:  Proper associated with RPD/PD & O2 Sat Exercise tolerated well No report of concerns or symptoms today Strength training completed today  Goals Unmet:  Not Applicable  Comments: Service time is from 1016 to 1131.    Dr. Mechele Collin is Medical Director for Pulmonary Rehab at [facility].

## 2023-11-03 NOTE — Progress Notes (Signed)
Home Exercise Prescription I have reviewed a Home Exercise Prescription with Vincent Meyers. Vincent Meyers is not currently exercising at home. He used to exercise at [facility] 3 days/wk for about 50 min/day until he started rehab. I encouraged Vincent Meyers to stay active and to return to exercise once he has graduated. Vincent Meyers agreed with my recommendations. I am unsure how likely he is to exercise while in rehab. Vincent Meyers seems more confident in exercising post rehab. The patient stated that their goals were to improve his lung function. We reviewed exercise guidelines, target heart rate during exercise, RPE Scale, weather conditions, endpoints for exercise, warmup and cool down. The patient is encouraged to come to me with any questions. I will continue to follow up with the patient to assist them with progression and safety. Spent 15 min with patient discussing home exercise plan and goals  Joya San, MS, ACSM-CEP 11/03/2023 3:13 PM

## 2023-11-08 ENCOUNTER — Encounter (HOSPITAL_COMMUNITY)
Admission: RE | Admit: 2023-11-08 | Discharge: 2023-11-08 | Disposition: A | Discharge status: Home / Self Care | Payer: Medicare HMO | Source: Ambulatory Visit | Attending: Internal Medicine | Admitting: Internal Medicine

## 2023-11-08 DIAGNOSIS — J84112 Idiopathic pulmonary fibrosis: Secondary | ICD-10-CM

## 2023-11-08 NOTE — Progress Notes (Signed)
Daily Session Note  Patient Details  Name: Vincent Meyers MRN: [id number] Date of Birth: 12-Sep-1946 Referring Provider:   Doristine Devoid Pulmonary Rehab Walk Test from 10/07/2023 in [facility] for Heart, Vascular, & Lung Health  Referring Provider Ramaswamy       Encounter Date: 11/08/2023  Check In:  Session Check In - 11/08/23 1114       Check-In   Supervising physician immediately available to respond to emergencies [facility] MD immediately available    Physician(s) Bernadene Person, NP    [facility]    Staff Present Raford Pitcher, MS, ACSM-CEP, Exercise Physiologist;Mary Gerre Scull, RN, BSN;Bernadett Milian Katrinka Blazing, RT;Randi Reeve BS, ACSM-CEP, Exercise Physiologist    Virtual Visit No    Medication changes reported     No    Fall or balance concerns reported    No    Tobacco Cessation No Change    Warm-up and Cool-down Performed as group-led instruction    Resistance Training Performed Yes    VAD Patient? No    PAD/SET Patient? No      Pain Assessment   Currently in Pain? No/denies    Multiple Pain Sites No             Capillary Blood Glucose: No results found for this or any previous visit (from the past 24 hours).    Social History   Tobacco Use  Smoking Status Former   Current packs/day: 0.00   Average packs/day: 2.0 packs/day for 23.0 years (46.0 ttl pk-yrs)   Types: Cigarettes   Start date: 29   Quit date: 1989   Years since quitting: 35.9   Passive exposure: Past  Smokeless Tobacco Never    Goals Met:  Proper associated with RPD/PD & O2 Sat Independence with exercise equipment Exercise tolerated well No report of concerns or symptoms today Strength training completed today  Goals Unmet:  Not Applicable  Comments: Service time is from 1010 to 1140.    Dr. Mechele Collin is Medical Director for Pulmonary Rehab at [facility].

## 2023-11-09 ENCOUNTER — Other Ambulatory Visit (HOSPITAL_COMMUNITY): Payer: Self-pay

## 2023-11-09 ENCOUNTER — Other Ambulatory Visit (HOSPITAL_BASED_OUTPATIENT_CLINIC_OR_DEPARTMENT_OTHER): Payer: Self-pay | Admitting: Family Medicine

## 2023-11-09 ENCOUNTER — Other Ambulatory Visit: Payer: Self-pay

## 2023-11-09 ENCOUNTER — Other Ambulatory Visit (HOSPITAL_COMMUNITY): Payer: Self-pay | Admitting: Pharmacy Technician

## 2023-11-09 DIAGNOSIS — I10 Essential (primary) hypertension: Secondary | ICD-10-CM

## 2023-11-09 NOTE — Progress Notes (Signed)
Specialty Pharmacy Refill Coordination Note  Vincent Meyers is a 77 y.o. male contacted today regarding refills of specialty medication(s) Pirfenidone   Patient requested Delivery   Delivery date: 11/11/23   Verified address: [address] [place], [place]   Medication will be filled on 11/10/23.

## 2023-11-10 ENCOUNTER — Other Ambulatory Visit: Payer: Self-pay

## 2023-11-10 ENCOUNTER — Encounter (HOSPITAL_COMMUNITY)
Admission: RE | Admit: 2023-11-10 | Discharge: 2023-11-10 | Disposition: A | Discharge status: Home / Self Care | Payer: Medicare HMO | Source: Ambulatory Visit | Attending: Internal Medicine | Admitting: Internal Medicine

## 2023-11-10 ENCOUNTER — Other Ambulatory Visit (HOSPITAL_COMMUNITY): Payer: Self-pay

## 2023-11-10 DIAGNOSIS — J84112 Idiopathic pulmonary fibrosis: Secondary | ICD-10-CM

## 2023-11-10 NOTE — Progress Notes (Signed)
Daily Session Note  Patient Details  Name: Vincent Meyers MRN: [id number] Date of Birth: Dec 08, 1945 Referring Provider:   Doristine Devoid Pulmonary Rehab Walk Test from 10/07/2023 in [facility] for Heart, Vascular, & Lung Health  Referring Provider Ramaswamy       Encounter Date: 11/10/2023  Check In:  Session Check In - 11/10/23 1051       Check-In   Supervising physician immediately available to respond to emergencies [facility] MD immediately available    Physician(s) Robin Searing, NP    [facility]    Staff Present Raford Pitcher, MS, ACSM-CEP, Exercise Physiologist;Mary Gerre Scull, RN, BSN;Hameed Kolar Katrinka Blazing, RT;Randi Reeve BS, ACSM-CEP, Exercise Physiologist    Virtual Visit No    Medication changes reported     No    Fall or balance concerns reported    No    Tobacco Cessation No Change    Warm-up and Cool-down Performed as group-led instruction    Resistance Training Performed Yes    VAD Patient? No    PAD/SET Patient? No             Capillary Blood Glucose: No results found for this or any previous visit (from the past 24 hours).    Social History   Tobacco Use  Smoking Status Former   Current packs/day: 0.00   Average packs/day: 2.0 packs/day for 23.0 years (46.0 ttl pk-yrs)   Types: Cigarettes   Start date: 76   Quit date: 1989   Years since quitting: 35.9   Passive exposure: Past  Smokeless Tobacco Never    Goals Met:  Proper associated with RPD/PD & O2 Sat Independence with exercise equipment Exercise tolerated well No report of concerns or symptoms today Strength training completed today  Goals Unmet:  Not Applicable  Comments: Service time is from 1011 to 1140.    Dr. Mechele Collin is Medical Director for Pulmonary Rehab at [facility].

## 2023-11-14 IMAGING — DX DG CHEST 2V
2 series · 2 of 2 positions shown · non-contrast
Comparison: 10/29/2019

CLINICAL DATA: Adventitious lung sounds, productive cough for 1
week that has worsened since yesterday, fever to 102 degrees
yesterday, negative home COVID test

EXAM:
CHEST - 2 VIEW

[chest pa]
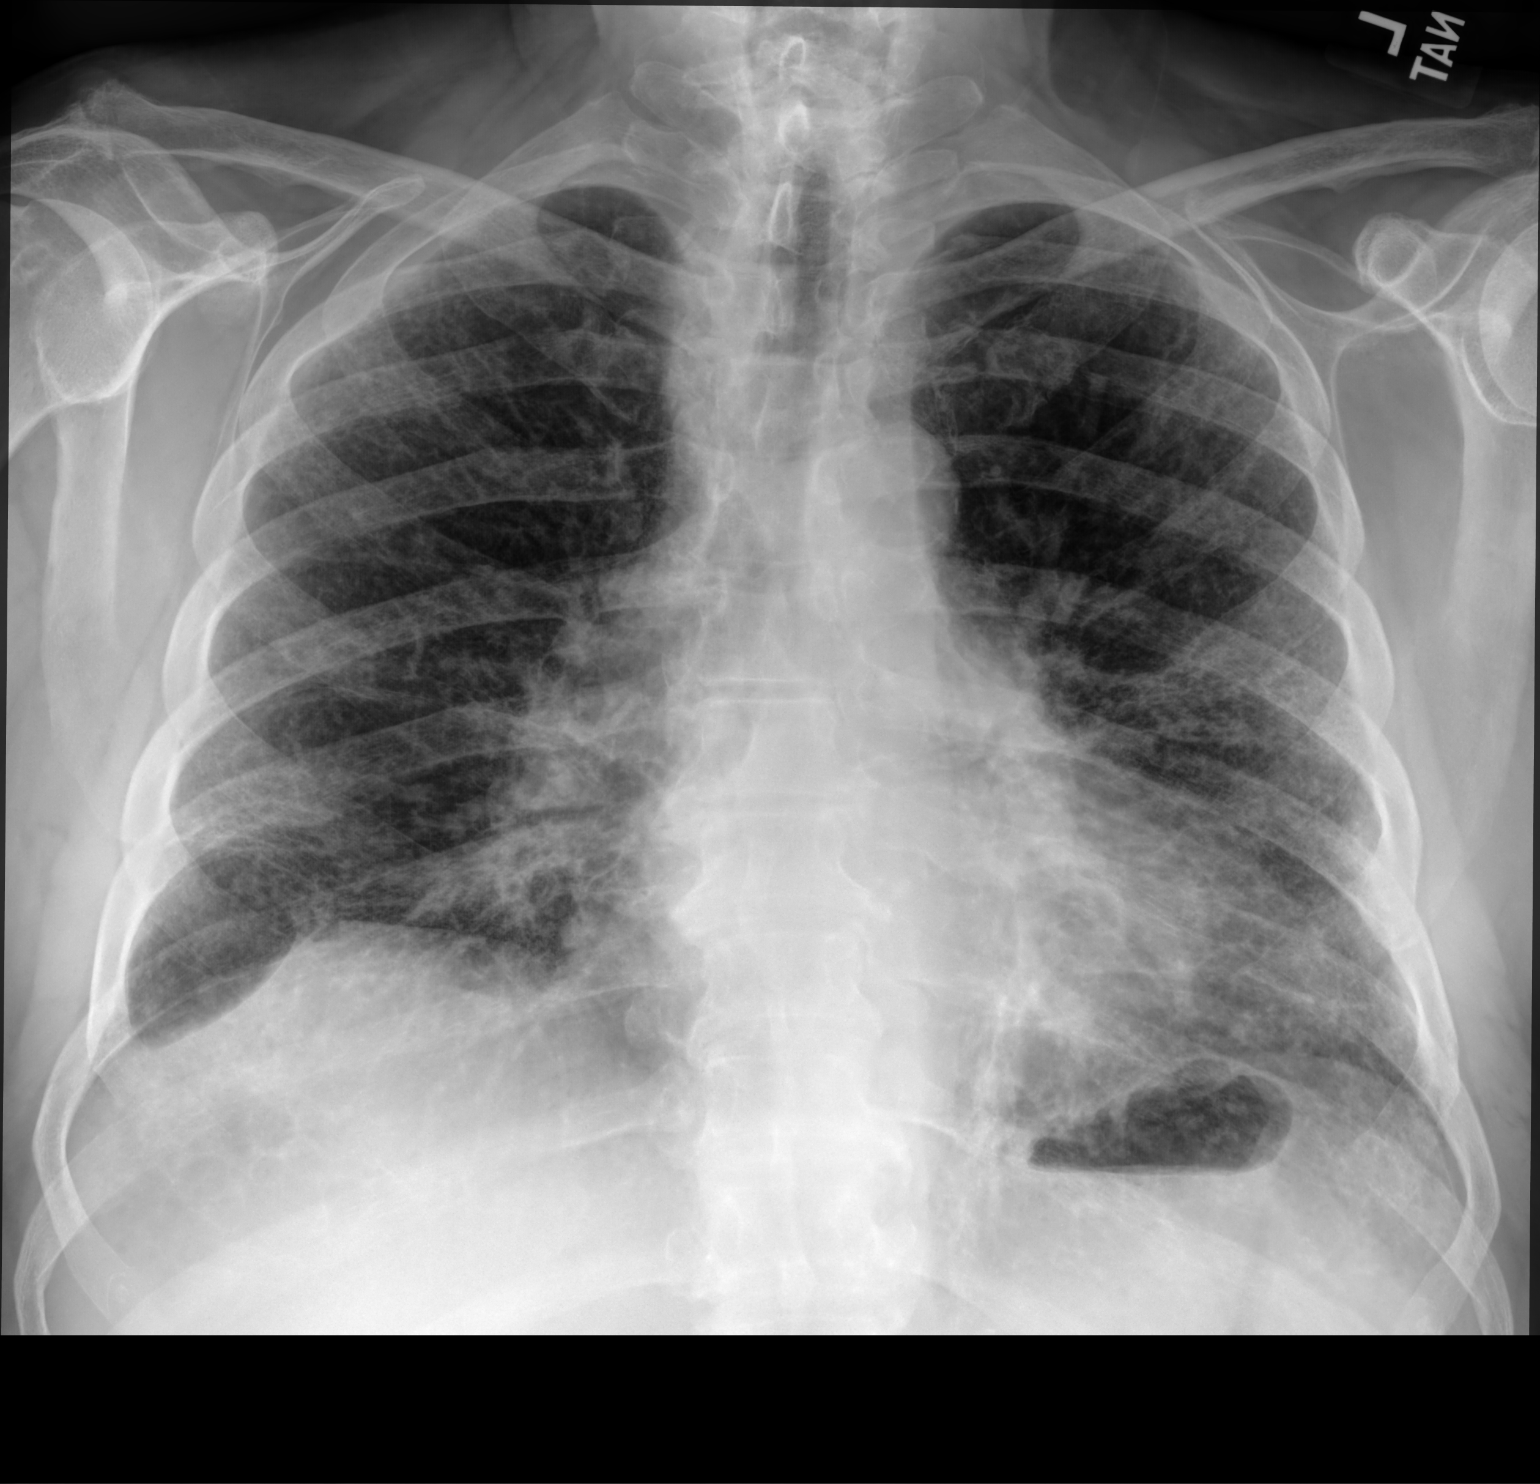

[chest lat]
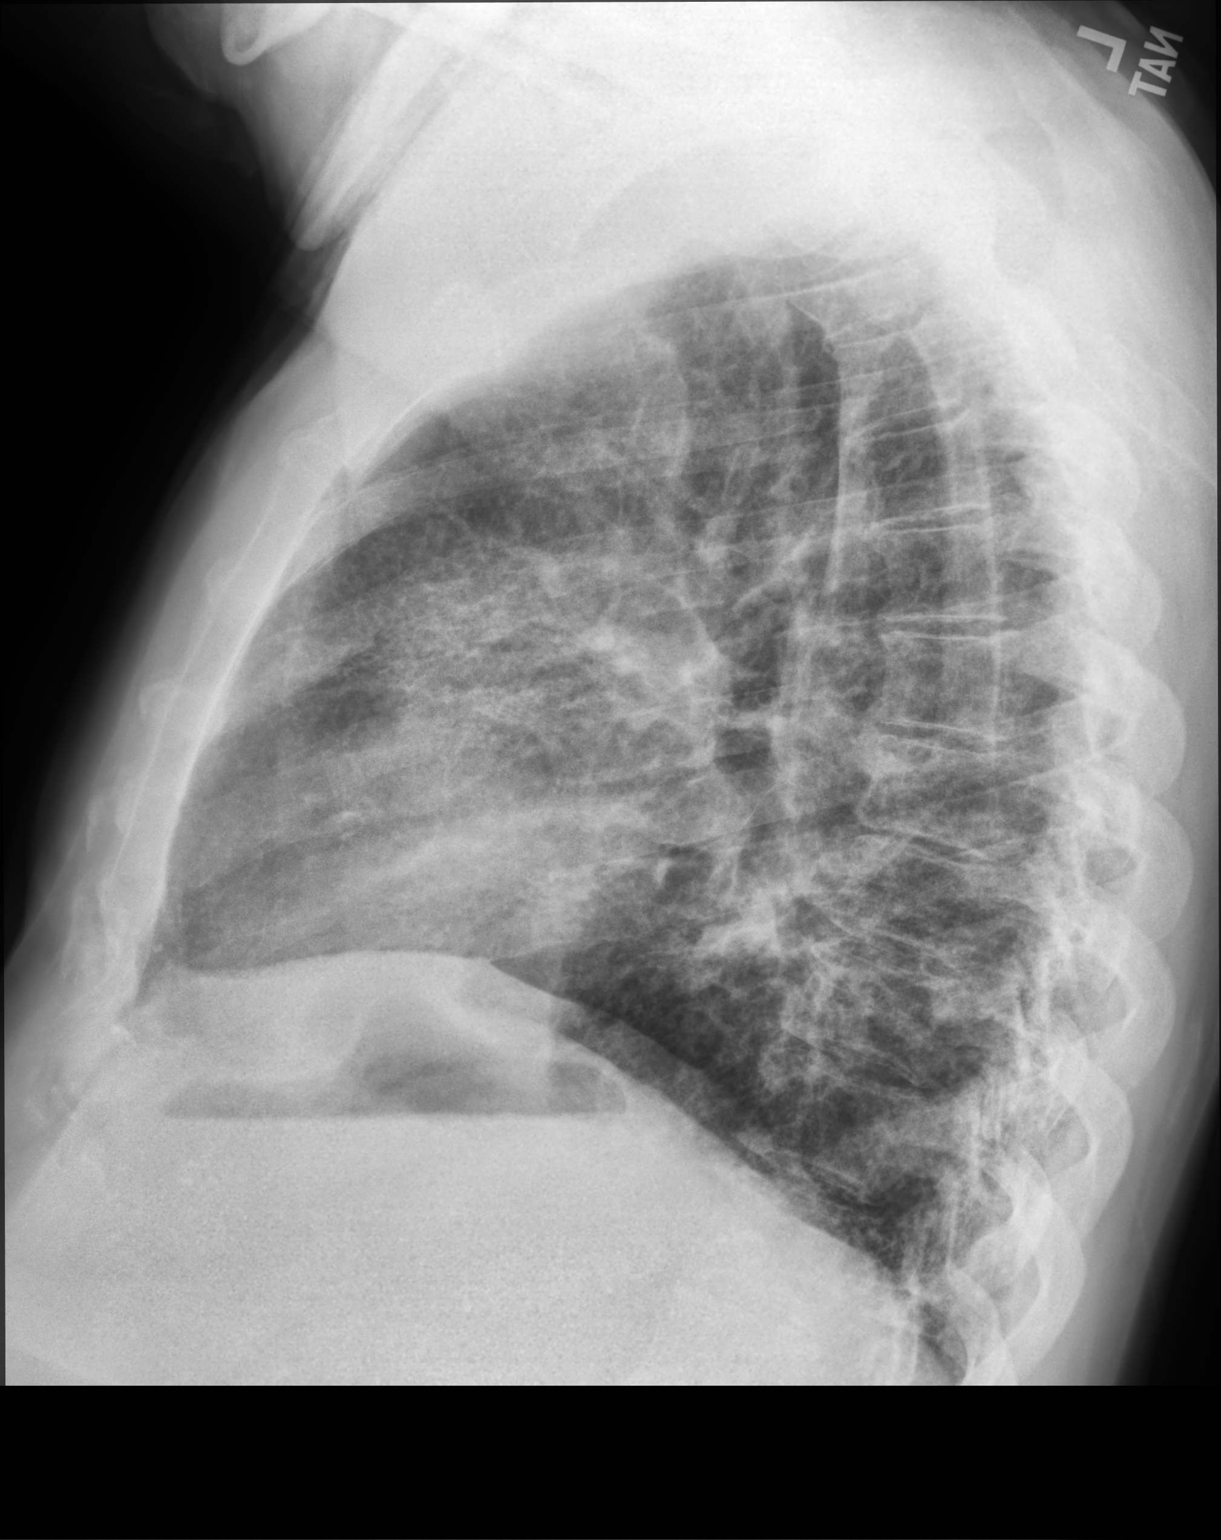

[2 of 2 positions shown; findings below may reference images not displayed]

FINDINGS: Upper normal heart size.

Prominent RIGHT hilum unchanged.

Mediastinal contours otherwise normal with atherosclerotic
calcification aorta.

Chronic interstitial lung disease changes at the mid to lower lungs
bilaterally, progressive versus prior exam. No acute infiltrate,
pleural effusion, or pneumothorax.

Osseous structures unremarkable.
IMPRESSION: Chronic enlargement of RIGHT hilum, may be due to vascular
structures or adenopathy, assessment on prior CTs suboptimal due to
lack of contrast.

Progression of chronic interstitial lung disease changes/pulmonary
fibrosis, greater at bases.

No definite acute infiltrate.

Aortic Atherosclerosis ([id number].M).

## 2023-11-15 ENCOUNTER — Encounter (HOSPITAL_COMMUNITY)
Admission: RE | Admit: 2023-11-15 | Discharge: 2023-11-15 | Disposition: A | Discharge status: Home / Self Care | Payer: Medicare HMO | Source: Ambulatory Visit | Attending: Internal Medicine

## 2023-11-15 VITALS — Wt 214.9 lb

## 2023-11-15 DIAGNOSIS — J84112 Idiopathic pulmonary fibrosis: Secondary | ICD-10-CM | POA: Diagnosis not present

## 2023-11-15 NOTE — Progress Notes (Signed)
Daily Session Note  Patient Details  Name: Vincent Meyers MRN: [id number] Date of Birth: Jun 18, 1946 Referring Provider:   Doristine Devoid Pulmonary Rehab Walk Test from 10/07/2023 in [facility]  Referring Provider Ramaswamy       Encounter Date: 11/15/2023  Check In:  Session Check In - 11/15/23 1025       Check-In   Supervising physician immediately available to respond to emergencies [facility] MD immediately available    Physician(s) Edd Fabian, NP    Location [facility]    Staff Present Essie Hart, RN, BSN;Eriana Suliman, RT;Randi Monmouth BS, ACSM-CEP, Exercise Physiologist    Virtual Visit No    Medication changes reported     No    Fall or balance concerns reported    No    Tobacco Cessation No Change    Warm-up and Cool-down Performed as group-led instruction    Resistance Training Performed Yes    VAD Patient? No    PAD/SET Patient? No      Pain Assessment   Currently in Pain? No/denies    Multiple Pain Sites No             Capillary Blood Glucose: No results found for this or any previous visit (from the past 24 hours).   Exercise Prescription Changes - 11/15/23 1200       Response to Exercise   Blood Pressure (Admit) 142/62    Blood Pressure (Exercise) 168/70    Blood Pressure (Exit) 116/60    Heart Rate (Admit) 71 bpm    Heart Rate (Exercise) 82 bpm    Heart Rate (Exit) 74 bpm    Oxygen Saturation (Admit) 97 %    Oxygen Saturation (Exercise) 89 %    Oxygen Saturation (Exit) 96 %    Rating of Perceived Exertion (Exercise) 13    Perceived Dyspnea (Exercise) 1    Duration Continue with 30 min of aerobic exercise without signs/symptoms of physical distress.    Intensity THRR unchanged      Progression   Progression Continue to progress workloads to maintain intensity without signs/symptoms of physical distress.      Resistance Training   Training Prescription Yes    Weight  blue bands    Reps 10-15    Time 10 Minutes      Oxygen   Oxygen Continuous    Liters 2      Recumbant Elliptical   Level 3    Minutes 15    METs 2.9      Track   Laps 9    Minutes 15    METs 2.38      Oxygen   Maintain Oxygen Saturation 88% or higher             Social History   Tobacco Use  Smoking Status Former   Current packs/day: 0.00   Average packs/day: 2.0 packs/day for 23.0 years (46.0 ttl pk-yrs)   Types: Cigarettes   Start date: 58   Quit date: 1989   Years since quitting: 36.0   Passive exposure: Past  Smokeless Tobacco Never    Goals Met:  Proper associated with RPD/PD & O2 Sat Independence with exercise equipment Exercise tolerated well No report of concerns or symptoms today Strength training completed today  Goals Unmet:  Not Applicable  Comments: Service time is from 1018 to 1140.    Dr. Mechele Collin is Medical Director for Pulmonary Rehab  at [facility].

## 2023-11-15 NOTE — Progress Notes (Signed)
Pulmonary Individual Treatment Plan  Patient Details  Name: Vincent Meyers MRN: [id number] Date of Birth: Oct 20, 1946 Referring Provider:   Doristine Devoid Pulmonary Rehab Walk Test from 10/07/2023 in [facility]  Referring Provider Ramaswamy       Initial Encounter Date:  Flowsheet Row Pulmonary Rehab Walk Test from 10/07/2023 in [facility] for [facility], [facility], [facility] Lung [facility]  Date 10/07/23       Visit Diagnosis: IPF (idiopathic pulmonary fibrosis) (HCC)  Patient's Home Medications on Admission:   Current Outpatient Medications:    alfuzosin (UROXATRAL) 10 MG 24 hr tablet, Take 1 tablet (10 mg total) by mouth daily., Disp: 30 tablet, Rfl: 11   amLODipine (NORVASC) 5 MG tablet, TAKE 1 TABLET (5 MG TOTAL) BY MOUTH DAILY., Disp: 90 tablet, Rfl: 2   aspirin EC 81 MG tablet, Take 81 mg by mouth daily., Disp: , Rfl:    atenolol (TENORMIN) 50 MG tablet, TAKE 1 AND 1/2 TABLETS BY MOUTH DAILY, Disp: 135 tablet, Rfl: 1   FIBER PO, Take 4 capsules by mouth 2 (two) times daily., Disp: , Rfl:    Flaxseed, Linseed, (FLAXSEED OIL PO), Take 1 capsule by mouth daily., Disp: , Rfl:    fluticasone (FLONASE) 50 MCG/ACT nasal spray, Place 2 sprays into both nostrils daily., Disp: , Rfl:    hydrochlorothiazide (HYDRODIURIL) 25 MG tablet, TAKE 1 TABLET BY MOUTH EVERY DAY, Disp: 90 tablet, Rfl: 0   lisinopril (ZESTRIL) 40 MG tablet, TAKE 1 TABLET BY MOUTH EVERY DAY, Disp: 90 tablet, Rfl: 1   loperamide (IMODIUM A-D) 2 MG tablet, Take 2-4 mg by mouth 4 (four) times daily as needed for diarrhea or loose stools., Disp: , Rfl:    MAGNESIUM-POTASSIUM PO, Take 1 tablet by mouth daily. With zinc, Disp: , Rfl:    Misc Natural Products (TURMERIC CURCUMIN) CAPS, Take 1 capsule by mouth daily., Disp: , Rfl:    omeprazole (PRILOSEC) 20 MG capsule, TAKE 1 CAPSULE BY MOUTH EVERY DAY, Disp: 90 capsule, Rfl: 1   Pirfenidone 801 MG TABS, Take  1 tablet (801 mg total) by mouth 3 (three) times daily with meals., Disp: 270 tablet, Rfl: 5   simvastatin (ZOCOR) 40 MG tablet, TAKE 1 TABLET(40 MG) BY MOUTH EVERY NIGHT, Disp: 90 tablet, Rfl: 1   tadalafil (CIALIS) 20 MG tablet, Take 1 tablet (20 mg total) by mouth daily as needed., Disp: 10 tablet, Rfl: 11  Past Medical History: Past Medical History:  Diagnosis Date   Acid reflux    Angina pectoris (HCC)    Anxiety    Arthritis    Coronary artery disease    Erectile dysfunction    Hyperlipidemia    Hypertension    IBS (irritable bowel syndrome)    ILD (interstitial lung disease) (HCC)    OSA (obstructive sleep apnea)     Tobacco Use: Social History   Tobacco Use  Smoking Status Former   Current packs/day: 0.00   Average packs/day: 2.0 packs/day for 23.0 years (46.0 ttl pk-yrs)   Types: Cigarettes   Start date: 23   Quit date: 1989   Years since quitting: 36.0   Passive exposure: Past  Smokeless Tobacco Never    Labs: Review Flowsheet  More data exists      Latest Ref Rng & Units 10/19/2022 04/26/2023 07/29/2023 09/09/2023 10/31/2023  Labs for ITP Cardiac and Pulmonary Rehab  Hemoglobin A1c 4.8 - 5.6 % 7.0  6.9  7.0  - 7.1   Bicarbonate 20.0 - 28.0 mmol/L - - - 28.0  29.6  -  TCO2 22 - 32 mmol/L - - - 29  31  26   -  O2 Saturation % - - - 76  79  -    Details       Multiple values from one day are sorted in reverse-chronological order         Capillary Blood Glucose: No results found for: "GLUCAP"   Pulmonary Assessment Scores:  Pulmonary Assessment Scores     Row Name 10/07/23 1429         ADL UCSD   ADL Phase Entry     SOB Score total 44       CAT Score   CAT Score 23       mMRC Score   mMRC Score 2             UCSD: Self-administered rating of dyspnea associated with activities of daily living (ADLs) 6-point scale (0 = "not at all" to 5 = "maximal or unable to do because of breathlessness")  Scoring Scores range from 0 to 120.   Minimally important difference is 5 units  CAT: CAT can identify the health impairment of COPD patients and is better correlated with disease progression.  CAT has a scoring range of zero to 40. The CAT score is classified into four groups of low (less than 10), medium (10 - 20), high (21-30) and very high (31-40) based on the impact level of disease on health status. A CAT score over 10 suggests significant symptoms.  A worsening CAT score could be explained by an exacerbation, poor medication adherence, poor inhaler technique, or progression of COPD or comorbid conditions.  CAT MCID is 2 points  mMRC: mMRC (Modified Medical Research Council) Dyspnea Scale is used to assess the degree of baseline functional disability in patients of respiratory disease due to dyspnea. No minimal important difference is established. A decrease in score of 1 point or greater is considered a positive change.   Pulmonary Function Assessment:  Pulmonary Function Assessment - 10/07/23 1408       Breath   Bilateral Breath Sounds Rales;Basilar    Shortness of Breath Yes;Limiting activity             Exercise Target Goals: Exercise Program Goal: Individual exercise prescription set using results from initial 6 min walk test and THRR while considering  patient's activity barriers and safety.   Exercise Prescription Goal: Initial exercise prescription builds to 30-45 minutes a day of aerobic activity, 2-3 days per week.  Home exercise guidelines will be given to patient during program as part of exercise prescription that the participant will acknowledge.  Activity Barriers & Risk Stratification:  Activity Barriers & Cardiac Risk Stratification - 10/07/23 1359       Activity Barriers & Cardiac Risk Stratification   Activity Barriers Deconditioning;Muscular Weakness;Shortness of Breath;Arthritis;Balance Concerns;Joint Problems;Left Knee Replacement;Right Knee Replacement    Cardiac Risk Stratification  Moderate             6 Minute Walk:  6 Minute Walk     Row Name 10/07/23 1457         6 Minute Walk   Phase Initial     Distance 905 feet     Walk Time 6 minutes     # of Rest Breaks 0     MPH 1.71     METS 1.98  RPE 11     Perceived Dyspnea  2     VO2 Peak 6.94     Symptoms No     Resting HR 73 bpm     Resting BP 142/70     Resting Oxygen Saturation  96 %     Exercise Oxygen Saturation  during 6 min walk 89 %     Max Ex. HR 123 bpm     Max Ex. BP 152/68     2 Minute Post BP 138/68       Interval HR   1 Minute HR 80     2 Minute HR 109     3 Minute HR 104     4 Minute HR 123     5 Minute HR 105     6 Minute HR 102     2 Minute Post HR 71     Interval Heart Rate? Yes       Interval Oxygen   Interval Oxygen? Yes     Baseline Oxygen Saturation % 96 %     1 Minute Oxygen Saturation % 94 %     1 Minute Liters of Oxygen 2 L     2 Minute Oxygen Saturation % 90 %     2 Minute Liters of Oxygen 2 L     3 Minute Oxygen Saturation % 89 %     3 Minute Liters of Oxygen 2 L     4 Minute Oxygen Saturation % 89 %     4 Minute Liters of Oxygen 2 L     5 Minute Oxygen Saturation % 89 %     5 Minute Liters of Oxygen 2 L     6 Minute Oxygen Saturation % 91 %     6 Minute Liters of Oxygen 2 L     2 Minute Post Oxygen Saturation % 97 %     2 Minute Post Liters of Oxygen 2 L              Oxygen Initial Assessment:  Oxygen Initial Assessment - 10/07/23 1403       Home Oxygen   Home Oxygen Device Portable Concentrator;Home Concentrator    Sleep Oxygen Prescription CPAP    Liters per minute 2    Home Exercise Oxygen Prescription Pulsed    Liters per minute 4    Home Resting Oxygen Prescription Continuous    Liters per minute 2    Compliance with Home Oxygen Use Yes      Initial 6 min Walk   Oxygen Used Continuous    Liters per minute 2      Program Oxygen Prescription   Program Oxygen Prescription Continuous    Liters per minute 2      Intervention    Short Term Goals To learn and exhibit compliance with exercise, home and travel O2 prescription;To learn and understand importance of monitoring SPO2 with pulse oximeter and demonstrate accurate use of the pulse oximeter.;To learn and understand importance of maintaining oxygen saturations>88%;To learn and demonstrate proper pursed lip breathing techniques or other breathing techniques. ;To learn and demonstrate proper use of respiratory medications    Long  Term Goals Exhibits compliance with exercise, home  and travel O2 prescription;Maintenance of O2 saturations>88%;Compliance with respiratory medication;Verbalizes importance of monitoring SPO2 with pulse oximeter and return demonstration;Exhibits proper breathing techniques, such as pursed lip breathing or other method taught during program session;Demonstrates proper use of MDI's  Oxygen Re-Evaluation:  Oxygen Re-Evaluation     Row Name 10/13/23 1605 11/07/23 0726           Program Oxygen Prescription   Program Oxygen Prescription Continuous Continuous      Liters per minute 2 2        Home Oxygen   Home Oxygen Device Portable Concentrator;Home Concentrator Portable Concentrator;Home Concentrator      Sleep Oxygen Prescription CPAP CPAP      Liters per minute 2 2      Home Exercise Oxygen Prescription Pulsed Pulsed      Liters per minute 4 4      Home Resting Oxygen Prescription Continuous Continuous      Liters per minute 2 2      Compliance with Home Oxygen Use Yes Yes        Goals/Expected Outcomes   Short Term Goals To learn and exhibit compliance with exercise, home and travel O2 prescription;To learn and understand importance of monitoring SPO2 with pulse oximeter and demonstrate accurate use of the pulse oximeter.;To learn and understand importance of maintaining oxygen saturations>88%;To learn and demonstrate proper pursed lip breathing techniques or other breathing techniques. ;To learn and demonstrate proper  use of respiratory medications To learn and exhibit compliance with exercise, home and travel O2 prescription;To learn and understand importance of monitoring SPO2 with pulse oximeter and demonstrate accurate use of the pulse oximeter.;To learn and understand importance of maintaining oxygen saturations>88%;To learn and demonstrate proper pursed lip breathing techniques or other breathing techniques. ;To learn and demonstrate proper use of respiratory medications      Long  Term Goals Exhibits compliance with exercise, home  and travel O2 prescription;Maintenance of O2 saturations>88%;Compliance with respiratory medication;Verbalizes importance of monitoring SPO2 with pulse oximeter and return demonstration;Exhibits proper breathing techniques, such as pursed lip breathing or other method taught during program session;Demonstrates proper use of MDI's Exhibits compliance with exercise, home  and travel O2 prescription;Maintenance of O2 saturations>88%;Compliance with respiratory medication;Verbalizes importance of monitoring SPO2 with pulse oximeter and return demonstration;Exhibits proper breathing techniques, such as pursed lip breathing or other method taught during program session;Demonstrates proper use of MDI's      Goals/Expected Outcomes Compliance and understanding of oxygen saturation monitoring and breathing techniques to decrease shortness of breath. Compliance and understanding of oxygen saturation monitoring and breathing techniques to decrease shortness of breath.               Oxygen Discharge (Final Oxygen Re-Evaluation):  Oxygen Re-Evaluation - 11/07/23 0726       Program Oxygen Prescription   Program Oxygen Prescription Continuous    Liters per minute 2      Home Oxygen   Home Oxygen Device Portable Concentrator;Home Concentrator    Sleep Oxygen Prescription CPAP    Liters per minute 2    Home Exercise Oxygen Prescription Pulsed    Liters per minute 4    Home Resting Oxygen  Prescription Continuous    Liters per minute 2    Compliance with Home Oxygen Use Yes      Goals/Expected Outcomes   Short Term Goals To learn and exhibit compliance with exercise, home and travel O2 prescription;To learn and understand importance of monitoring SPO2 with pulse oximeter and demonstrate accurate use of the pulse oximeter.;To learn and understand importance of maintaining oxygen saturations>88%;To learn and demonstrate proper pursed lip breathing techniques or other breathing techniques. ;To learn and demonstrate proper use of respiratory medications    Long  Term Goals Exhibits compliance with exercise, home  and travel O2 prescription;Maintenance of O2 saturations>88%;Compliance with respiratory medication;Verbalizes importance of monitoring SPO2 with pulse oximeter and return demonstration;Exhibits proper breathing techniques, such as pursed lip breathing or other method taught during program session;Demonstrates proper use of MDI's    Goals/Expected Outcomes Compliance and understanding of oxygen saturation monitoring and breathing techniques to decrease shortness of breath.             Initial Exercise Prescription:  Initial Exercise Prescription - 10/07/23 1500       Date of Initial Exercise RX and Referring Provider   Date 10/07/23    Referring Provider Ramaswamy    Expected Discharge Date 12/29/23      Oxygen   Oxygen Continuous    Liters 2    Maintain Oxygen Saturation 88% or higher      Recumbant Elliptical   Level 2    RPM 40    Watts 20    Minutes 15    METs 2      Track   Minutes 15    METs 2      Prescription Details   Frequency (times per week) 2    Duration Progress to 30 minutes of continuous aerobic without signs/symptoms of physical distress      Intensity   THRR 40-80% of Max Heartrate 58-115    Ratings of Perceived Exertion 11-13    Perceived Dyspnea 0-4      Progression   Progression Continue to progress workloads to maintain  intensity without signs/symptoms of physical distress.      Resistance Training   Training Prescription Yes    Weight blue bands    Reps 10-15             Perform Capillary Blood Glucose checks as needed.  Exercise Prescription Changes:   Exercise Prescription Changes     Row Name 10/13/23 1034 11/01/23 1100 11/03/23 1500         Response to Exercise   Blood Pressure (Admit) 138/58 156/58 --     Blood Pressure (Exercise) -- 132/66 --     Blood Pressure (Exit) 122/60 128/60 --     Heart Rate (Admit) 81 bpm 86 bpm --     Heart Rate (Exercise) 87 bpm 87 bpm --     Heart Rate (Exit) 71 bpm 74 bpm --     Oxygen Saturation (Admit) 96 % 95 % --     Oxygen Saturation (Exercise) 91 % 92 % --     Oxygen Saturation (Exit) 96 % 92 % --     Rating of Perceived Exertion (Exercise) 13 13 --     Perceived Dyspnea (Exercise) 2 1 --     Duration Continue with 30 min of aerobic exercise without signs/symptoms of physical distress. Continue with 30 min of aerobic exercise without signs/symptoms of physical distress. --     Intensity THRR unchanged THRR unchanged --       Progression   Progression Continue to progress workloads to maintain intensity without signs/symptoms of physical distress. Continue to progress workloads to maintain intensity without signs/symptoms of physical distress. --       Paramedic Prescription Yes Yes --     Weight blue bands blue bands --     Reps 10-15 10-15 --     Time 10 Minutes 10 Minutes --       Oxygen   Oxygen Continuous Continuous --  Liters 2 2 --       Recumbant Elliptical   Level 1 3 --     Minutes 15 15 --     METs 1.9 1.9 --       Track   Laps 6 7 --     Minutes 15 15 --     METs 1.92 2.08 --       Home Exercise Plan   Plans to continue exercise at -- -- [employer] (comment)  [facility]     Frequency -- -- Add 1 additional day to program exercise sessions.     Initial Home Exercises Provided -- --  11/03/23       Oxygen   Maintain Oxygen Saturation 88% or higher 88% or higher --              Exercise Comments:   Exercise Comments     Row Name 10/11/23 1555 11/03/23 1511         Exercise Comments Sam has completed first day of exercise. He exercised for 15 min on the track and recumbent elliptical. He averaged 2.23 METs on the track and 1.9 METs at level 1 on the recumbent elliptical. He performed the warmup and cooldown standing without limitations. Discussed METs. Completed home exercise plan. Sam is not currently exercising at home. He used to exercise at [facility] 3 days/wk for about 50 min/day until he started rehab. I encouraged Sam to stay active and to return to exercise once he has graduated. Sam agreed with my recommendations. I am unsure how likely he is to exercise while in rehab. Sam seems more confident in exercising post rehab.               Exercise Goals and Review:   Exercise Goals     Row Name 10/07/23 1403             Exercise Goals   Increase Physical Activity Yes       Intervention Provide advice, education, support and counseling about physical activity/exercise needs.;Develop an individualized exercise prescription for aerobic and resistive training based on initial evaluation findings, risk stratification, comorbidities and participant's personal goals.       Expected Outcomes Short Term: Attend rehab on a regular basis to increase amount of physical activity.;Long Term: Add in home exercise to make exercise part of routine and to increase amount of physical activity.;Long Term: Exercising regularly at least 3-5 days a week.       Increase Strength and Stamina Yes       Intervention Provide advice, education, support and counseling about physical activity/exercise needs.;Develop an individualized exercise prescription for aerobic and resistive training based on initial evaluation findings, risk stratification, comorbidities and participant's  personal goals.       Expected Outcomes Short Term: Increase workloads from initial exercise prescription for resistance, speed, and METs.;Short Term: Perform resistance training exercises routinely during rehab and add in resistance training at home;Long Term: Improve cardiorespiratory fitness, muscular endurance and strength as measured by increased METs and functional capacity ( )       Able to understand and use rate of perceived exertion (RPE) scale Yes       Intervention Provide education and explanation on how to use RPE scale       Expected Outcomes Short Term: Able to use RPE daily in rehab to express subjective intensity level;Long Term:  Able to use RPE to guide intensity level when exercising independently  Able to understand and use Dyspnea scale Yes       Intervention Provide education and explanation on how to use Dyspnea scale       Expected Outcomes Short Term: Able to use Dyspnea scale daily in rehab to express subjective sense of shortness of breath during exertion;Long Term: Able to use Dyspnea scale to guide intensity level when exercising independently       Knowledge and understanding of Target Heart Rate Range (THRR) Yes       Intervention Provide education and explanation of THRR including how the numbers were predicted and where they are located for reference       Expected Outcomes Long Term: Able to use THRR to govern intensity when exercising independently;Short Term: Able to state/look up THRR;Short Term: Able to use daily as guideline for intensity in rehab       Understanding of Exercise Prescription Yes       Intervention Provide education, explanation, and written materials on patient's individual exercise prescription       Expected Outcomes Short Term: Able to explain program exercise prescription;Long Term: Able to explain home exercise prescription to exercise independently                Exercise Goals Re-Evaluation :  Exercise Goals Re-Evaluation      Row Name 10/13/23 1559 11/07/23 0724           Exercise Goal Re-Evaluation   Exercise Goals Review Increase Physical Activity;Able to understand and use Dyspnea scale;Understanding of Exercise Prescription;Increase Strength and Stamina;Knowledge and understanding of Target Heart Rate Range (THRR);Able to understand and use rate of perceived exertion (RPE) scale Increase Physical Activity;Able to understand and use Dyspnea scale;Understanding of Exercise Prescription;Increase Strength and Stamina;Knowledge and understanding of Target Heart Rate Range (THRR);Able to understand and use rate of perceived exertion (RPE) scale      Comments Sam has completed 2 exercise sessions. He exercises for 15 min on the track and recumbent elliptical. Sam averages 2.08 METs on the track and 1.9 METs at level 1 on the recumbent elliptical. He performs the warmup and cooldown standing wihtout limitations. It is too soon to notate any discernable progressions. Will continue to monitor and progress as able. Sam has completed 5 exercise sessions. He exercises for 15 min on the track and recumbent elliptical. Sam averages 2.23 METs on the track and 2.2 METs at level 3 on the recumbent elliptical. He performs the warmup and cooldown standing wihtout limitations. Sam has slightly increased his laps on the track and level on the recumbent elliptical. He tolerates progressions well. We have recently discussed home exercise as Sam has stopped exercise at [facility] for rehab. I encouraged him to start exercising at [facility] again. Will continue to monitor and progress as able.      Expected Outcomes Through exercise at rehab and home, the patient will decrease shortness of breath with daily activities and feel confident in carrying out an exercise regimen at home. Through exercise at rehab and home, the patient will decrease shortness of breath with daily activities and feel confident in carrying out an exercise regimen at home.                Discharge Exercise Prescription (Final Exercise Prescription Changes):  Exercise Prescription Changes - 11/03/23 1500       Home Exercise Plan   Plans to continue exercise at [facility] (comment)   [facility]   Frequency Add 1 additional day to program exercise  sessions.    Initial Home Exercises Provided 11/03/23             Nutrition:  Target Goals: Understanding of nutrition guidelines, daily intake of sodium 1500mg , cholesterol 200mg , calories 30% from fat and 7% or less from saturated fats, daily to have 5 or more servings of fruits and vegetables.  Biometrics:  Pre Biometrics - 10/07/23 1319       Pre Biometrics   Grip Strength 30 kg              Nutrition Therapy Plan and Nutrition Goals:  Nutrition Therapy & Goals - 10/11/23 1130       Nutrition Therapy   Diet Heart Healthy/ Carbohydrate Consistent diet    Drug/Food Interactions Statins/Certain Fruits      Personal Nutrition Goals   Nutrition Goal Patient to improve diet quality by using the plate method as a guide for meal planning to include lean protein/plant protein, fruits, vegetables, whole grains, nonfat dairy as part of a well-balanced diet.    Personal Goal #2 Patient to continue to follow a low sodium diet 2300mg  per day    Comments Sam has medical history of COPD, PAD, Hyperlipidemia, pulmonary fibrosis, DM2, HTN, CAD, OSA. He does not check his blood sugar regularly. Lipids remain well controlled. He has good knowledge of  low sodium diet, reading food labels for sodium, benefits of small/frequent meals. He enjoys a wide variety of foods including lean protein, fruits, and vegetables. Patient will benefit from participation in pulmonary rehab for nutrition, exercise, and lifestyle modification.      Intervention Plan   Intervention Prescribe, educate and counsel regarding individualized specific dietary modifications aiming towards targeted core components such as weight,  hypertension, lipid management, diabetes, heart failure and other comorbidities.;Nutrition handout(s) given to patient.    Expected Outcomes Short Term Goal: Understand basic principles of dietary content, such as calories, fat, sodium, cholesterol and nutrients.;Long Term Goal: Adherence to prescribed nutrition plan.             Nutrition Assessments:  Nutrition Assessments - 10/11/23 1145       Rate Your Plate Scores   Pre Score 75            MEDIFICTS Score Key: >=70 Need to make dietary changes  40-70 Heart Healthy Diet <= 40 Therapeutic Level Cholesterol Diet  Flowsheet Row PULMONARY REHAB OTHER RESPIRATORY from 10/11/2023 in [facility] for Heart, Vascular, & Lung Health  Picture Your Plate Total Score on Admission 75      Picture Your Plate Scores: <43 Unhealthy dietary pattern with much room for improvement. 41-50 Dietary pattern unlikely to meet recommendations for good health and room for improvement. 51-60 More healthful dietary pattern, with some room for improvement.  >60 Healthy dietary pattern, although there may be some specific behaviors that could be improved.    Nutrition Goals Re-Evaluation:  Nutrition Goals Re-Evaluation     Row Name 10/11/23 1130             Goals   Current Weight 217 lb 6 oz (98.6 kg)       Comment A1c 7.0, lipids from 04/08/2022- HDL 31, LDL 56       Expected Outcome Sam has medical history of COPD, PAD, Hyperlipidemia, pulmonary fibrosis, DM2, HTN, CAD, OSA. He does not check his blood sugar regularly. Lipids remain well controlled. He has good knowledge of low sodium diet, reading food labels for sodium, benefits of small/frequent meals.  He enjoys a wide variety of foods including lean protein, fruits, and vegetables. Patient will benefit from participation in pulmonary rehab for nutrition, exercise, and lifestyle modification.                Nutrition Goals Discharge (Final Nutrition Goals  Re-Evaluation):  Nutrition Goals Re-Evaluation - 10/11/23 1130       Goals   Current Weight 217 lb 6 oz (98.6 kg)    Comment A1c 7.0, lipids from 04/08/2022- HDL 31, LDL 56    Expected Outcome Sam has medical history of COPD, PAD, Hyperlipidemia, pulmonary fibrosis, DM2, HTN, CAD, OSA. He does not check his blood sugar regularly. Lipids remain well controlled. He has good knowledge of low sodium diet, reading food labels for sodium, benefits of small/frequent meals. He enjoys a wide variety of foods including lean protein, fruits, and vegetables. Patient will benefit from participation in pulmonary rehab for nutrition, exercise, and lifestyle modification.             Psychosocial: Target Goals: Acknowledge presence or absence of significant depression and/or stress, maximize coping skills, provide positive support system. Participant is able to verbalize types and ability to use techniques and skills needed for reducing stress and depression.  Initial Review & Psychosocial Screening:  Initial Psych Review & Screening - 10/07/23 1346       Initial Review   Current issues with Current Depression;Current Stress Concerns    Source of Stress Concerns Chronic Illness;Unable to participate in former interests or hobbies    Comments Pts initial PHQ9 was 2, but he states he sometimes feels depressed when he thinks about his future.      Family Dynamics   Good Support System? Yes    Comments Pts significant other and family provides support      Barriers   Psychosocial barriers to participate in program The patient should benefit from training in stress management and relaxation.      Screening Interventions   Interventions Encouraged to exercise;To provide support and resources with identified psychosocial needs    Expected Outcomes Short Term goal: Utilizing psychosocial counselor, staff and physician to assist with identification of specific Stressors or current issues interfering with  healing process. Setting desired goal for each stressor or current issue identified.;Long Term Goal: Stressors or current issues are controlled or eliminated.;Short Term goal: Identification and review with participant of any Quality of Life or Depression concerns found by scoring the questionnaire.;Long Term goal: The participant improves quality of Life and PHQ9 Scores as seen by post scores and/or verbalization of changes             Quality of Life Scores:  Scores of 19 and below usually indicate a poorer quality of life in these areas.  A difference of  2-3 points is a clinically meaningful difference.  A difference of 2-3 points in the total score of the Quality of Life Index has been associated with significant improvement in overall quality of life, self-image, physical symptoms, and general health in studies assessing change in quality of life.  PHQ-9: Review Flowsheet  More data exists      10/31/2023 10/07/2023 07/29/2023 04/26/2023 01/04/2023  Depression screen PHQ 2/9  Decreased Interest 0 0 0 2 0  Down, Depressed, Hopeless 0 0 1 1 0  PHQ - 2 Score 0 0 1 3 0  Altered sleeping 0 1 3 3  -  Tired, decreased energy 0 0 3 3 -  Change in appetite 0 0 0  2 -  Feeling bad or failure about yourself  0 1 0 0 -  Trouble concentrating 0 0 0 0 -  Moving slowly or fidgety/restless 0 0 0 0 -  Suicidal thoughts 0 0 0 0 -  PHQ-9 Score 0 2 7 11  -  Difficult doing work/chores Not difficult at all Somewhat difficult - Somewhat difficult -   Interpretation of Total Score  Total Score Depression Severity:  1-4 = Minimal depression, 5-9 = Mild depression, 10-14 = Moderate depression, 15-19 = Moderately severe depression, 20-27 = Severe depression   Psychosocial Evaluation and Intervention:  Psychosocial Evaluation - 10/07/23 1352       Psychosocial Evaluation & Interventions   Interventions Stress management education;Relaxation education;Encouraged to exercise with the program and follow  exercise prescription    Comments Sam states he feels depressed sometimes when he thinks about his future. 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 He denies needing psychotropic meds and a referral to a therapist at this time.    Expected Outcomes For Sam to participate in PR free of any psychosocial barriers or concerns.    Continue Psychosocial Services  Follow up required by staff             Psychosocial Re-Evaluation:  Psychosocial Re-Evaluation     Row Name 10/14/23 1342 11/07/23 1112           Psychosocial Re-Evaluation   Current issues with Current Depression;Current Stress Concerns Current Stress Concerns;Current Depression      Comments Sam has only attended two sessions so far. He denies any new psychosocial barriers or concerns at this time. Sam feels his depression is stable at this time. He states he is not depressed every day. Sometimes he has moments of sadness and hoplessness when he thinks about his future. He denies any new psychosocial barriers or concerns at this time.      Expected Outcomes For Sam to participate in PR with no depression or stress. For Sam to participate in PR with no depression or stress.      Interventions Encouraged to attend Pulmonary Rehabilitation for the exercise Encouraged to attend Pulmonary Rehabilitation for the exercise      Continue Psychosocial Services  Follow up required by staff Follow up required by staff               Psychosocial Discharge (Final Psychosocial Re-Evaluation):  Psychosocial Re-Evaluation - 11/07/23 1112       Psychosocial Re-Evaluation   Current issues with Current Stress Concerns;Current Depression    Comments Sam feels his depression is stable at this time. He states he is not depressed every day. Sometimes he has moments of sadness and hoplessness when he thinks about his future. He denies any new psychosocial barriers or concerns at this time.    Expected Outcomes For  Sam to participate in PR with no depression or stress.    Interventions Encouraged to attend Pulmonary Rehabilitation for the exercise    Continue Psychosocial Services  Follow up required by staff             Education: Education Goals: Education classes will be provided on a weekly basis, covering required topics. Participant will state understanding/return demonstration of topics presented.  Learning Barriers/Preferences:  Learning Barriers/Preferences - 10/07/23 1352       Learning Barriers/Preferences   Learning Barriers Sight;Hearing    Learning Preferences Written Material;Group Instruction;Individual Instruction  Education Topics: Know Your Numbers Group instruction that is supported by a PowerPoint presentation. Instructor discusses importance of knowing and understanding resting, exercise, and post-exercise oxygen saturation, heart rate, and blood pressure. Oxygen saturation, heart rate, blood pressure, rating of perceived exertion, and dyspnea are reviewed along with a normal range for these values.    Exercise for the Pulmonary Patient Group instruction that is supported by a PowerPoint presentation. Instructor discusses benefits of exercise, core components of exercise, frequency, duration, and intensity of an exercise routine, importance of utilizing pulse oximetry during exercise, safety while exercising, and options of places to exercise outside of rehab.    MET Level  Group instruction provided by PowerPoint, verbal discussion, and written material to support subject matter. Instructor reviews what METs are and how to increase METs.    Pulmonary Medications Verbally interactive group education provided by instructor with focus on inhaled medications and proper administration. Flowsheet Row PULMONARY REHAB OTHER RESPIRATORY from 11/10/2023 in [facility] for Heart, Vascular, & Lung Health  Date 11/10/23  Educator RT   Instruction Review Code 1- Verbalizes Understanding       Anatomy and Physiology of the Respiratory System Group instruction provided by PowerPoint, verbal discussion, and written material to support subject matter. Instructor reviews respiratory cycle and anatomical components of the respiratory system and their functions. Instructor also reviews differences in obstructive and restrictive respiratory diseases with examples of each.  Flowsheet Row PULMONARY REHAB OTHER RESPIRATORY from 11/03/2023 in [facility] for Heart, Vascular, & Lung Health  Date 11/03/23  Educator RT  Instruction Review Code 1- Verbalizes Understanding       Oxygen Safety Group instruction provided by PowerPoint, verbal discussion, and written material to support subject matter. There is an overview of "What is Oxygen" and "Why do we need it".  Instructor also reviews how to create a safe environment for oxygen use, the importance of using oxygen as prescribed, and the risks of noncompliance. There is a brief discussion on traveling with oxygen and resources the patient may utilize.   Oxygen Use Group instruction provided by PowerPoint, verbal discussion, and written material to discuss how supplemental oxygen is prescribed and different types of oxygen supply systems. Resources for more information are provided.    Breathing Techniques Group instruction that is supported by demonstration and informational handouts. Instructor discusses the benefits of pursed lip and diaphragmatic breathing and detailed demonstration on how to perform both.     Risk Factor Reduction Group instruction that is supported by a PowerPoint presentation. Instructor discusses the definition of a risk factor, different risk factors for pulmonary disease, and how the heart and lungs work together.   Pulmonary Diseases Group instruction provided by PowerPoint, verbal discussion, and written material to support  subject matter. Instructor gives an overview of the different type of pulmonary diseases. There is also a discussion on risk factors and symptoms as well as ways to manage the diseases. Flowsheet Row PULMONARY REHAB OTHER RESPIRATORY from 10/27/2023 in [facility] for Heart, Vascular, & Lung Health  Date 10/27/23  Educator RT  Instruction Review Code 1- Verbalizes Understanding       Stress and Energy Conservation Group instruction provided by PowerPoint, verbal discussion, and written material to support subject matter. Instructor gives an overview of stress and the impact it can have on the body. Instructor also reviews ways to reduce stress. There is also a discussion on energy conservation and ways to conserve  energy throughout the day.   Warning Signs and Symptoms Group instruction provided by PowerPoint, verbal discussion, and written material to support subject matter. Instructor reviews warning signs and symptoms of stroke, heart attack, cold and flu. Instructor also reviews ways to prevent the spread of infection.   Other Education Group or individual verbal, written, or video instructions that support the educational goals of the pulmonary rehab program.    Knowledge Questionnaire Score:  Knowledge Questionnaire Score - 10/07/23 1431       Knowledge Questionnaire Score   Pre Score 15/18             Core Components/Risk Factors/Patient Goals at Admission:  Personal Goals and Risk Factors at Admission - 10/07/23 1354       Core Components/Risk Factors/Patient Goals on Admission    Weight Management Yes;Weight Loss    Intervention Weight Management: Develop a combined nutrition and exercise program designed to reach desired caloric intake, while maintaining appropriate intake of nutrient and fiber, sodium and fats, and appropriate energy expenditure required for the weight goal.;Weight Management: Provide education and appropriate resources to  help participant work on and attain dietary goals.;Weight Management/Obesity: Establish reasonable short term and long term weight goals.;Obesity: Provide education and appropriate resources to help participant work on and attain dietary goals.    Expected Outcomes Short Term: Continue to assess and modify interventions until short term weight is achieved;Long Term: Adherence to nutrition and physical activity/exercise program aimed toward attainment of established weight goal;Weight Loss: Understanding of general recommendations for a balanced deficit meal plan, which promotes 1-2 lb weight loss per week and includes a negative energy balance of [phone] kcal/d;Understanding recommendations for meals to include 15-35% energy as protein, 25-35% energy from fat, 35-60% energy from carbohydrates, less than 200mg  of dietary cholesterol, 20-35 gm of total fiber daily;Understanding of distribution of calorie intake throughout the day with the consumption of 4-5 meals/snacks    Improve shortness of breath with ADL's Yes    Intervention Provide education, individualized exercise plan and daily activity instruction to help decrease symptoms of SOB with activities of daily living.    Expected Outcomes Short Term: Improve cardiorespiratory fitness to achieve a reduction of symptoms when performing ADLs;Long Term: Be able to perform more ADLs without symptoms or delay the onset of symptoms             Core Components/Risk Factors/Patient Goals Review:   Goals and Risk Factor Review     Row Name 10/14/23 1345 11/07/23 1114           Core Components/Risk Factors/Patient Goals Review   Personal Goals Review Weight Management/Obesity;Improve shortness of breath with ADL's;Develop more efficient breathing techniques such as purse lipped breathing and diaphragmatic breathing and practicing self-pacing with activity. Weight Management/Obesity;Improve shortness of breath with ADL's;Develop more efficient breathing  techniques such as purse lipped breathing and diaphragmatic breathing and practicing self-pacing with activity.      Review Sam has only attended two sessions so far. Goal progressing for weight loss. Goal progressing for improving shortness of breath with ADL's. Sam is requiring 2L of O2 to maintain sats >88% while exercising. Goal progressing for developing more efficient breathing techniques such as purse lipped breathing and diaphragmatic breathing; and practicing self-pacing with activity. We will continue to monitor Sam's progress throughout the program. Goal progressing for weight loss. Goal progressing for improving shortness of breath with ADL's. Sam is requiring 2L of O2 to maintain sats >88% while exercising. Goal met for developing  more efficient breathing techniques such as purse lipped breathing and diaphragmatic breathing; and practicing self-pacing with activity. Sam is able to demonstrate purse lip breathing when he gets short of breath. He also knows how to pace himself when he is walking the track.  We will continue to monitor Sam's progress throughout the program.      Expected Outcomes To lose weight, improve shortness of breath with ADL's and develop more efficient breathing techniques such as purse lipped breathing and diaphragmatic breathing; and practicing self-pacing with activity. To lose weight and improve shortness of breath with ADL's.               Core Components/Risk Factors/Patient Goals at Discharge (Final Review):   Goals and Risk Factor Review - 11/07/23 1114       Core Components/Risk Factors/Patient Goals Review   Personal Goals Review Weight Management/Obesity;Improve shortness of breath with ADL's;Develop more efficient breathing techniques such as purse lipped breathing and diaphragmatic breathing and practicing self-pacing with activity.    Review Goal progressing for weight loss. Goal progressing for improving shortness of breath with ADL's. Sam is  requiring 2L of O2 to maintain sats >88% while exercising. Goal met for developing more efficient breathing techniques such as purse lipped breathing and diaphragmatic breathing; and practicing self-pacing with activity. Sam is able to demonstrate purse lip breathing when he gets short of breath. He also knows how to pace himself when he is walking the track.  We will continue to monitor Sam's progress throughout the program.    Expected Outcomes To lose weight and improve shortness of breath with ADL's.             ITP Comments: Pt is making expected progress toward Pulmonary Rehab goals after completing 7 session(s). Recommend continued exercise, life style modification, education, and utilization of breathing techniques to increase stamina and strength, while also decreasing shortness of breath with exertion.    Comments: Dr. Mechele Collin is Medical Director for Pulmonary Rehab at [facility]

## 2023-11-17 ENCOUNTER — Encounter (HOSPITAL_COMMUNITY)
Admission: RE | Admit: 2023-11-17 | Discharge: 2023-11-17 | Disposition: A | Discharge status: Home / Self Care | Payer: Medicare HMO | Source: Ambulatory Visit | Attending: Internal Medicine | Admitting: Internal Medicine

## 2023-11-17 DIAGNOSIS — J84112 Idiopathic pulmonary fibrosis: Secondary | ICD-10-CM

## 2023-11-17 NOTE — Progress Notes (Signed)
Daily Session Note  Patient Details  Name: Vincent Meyers MRN: [id number] Date of Birth: 1945-12-13 Referring Provider:   Doristine Devoid Pulmonary Rehab Walk Test from 10/07/2023 in [facility] for Heart, Vascular, & Lung Health  Referring Provider Ramaswamy       Encounter Date: 11/17/2023  Check In:  Session Check In - 11/17/23 1030       Check-In   Supervising physician immediately available to respond to emergencies [facility] MD immediately available    Physician(s) Reather Littler, NP    Location [facility]    Staff Present Essie Hart, RN, BSN;Phillis Thackeray Katrinka Blazing, RT;Randi [place] BS, ACSM-CEP, Exercise Physiologist;Maria Whitaker, RN, BSN    Virtual Visit No    Medication changes reported     No    Fall or balance concerns reported    No    Tobacco Cessation No Change    Warm-up and Cool-down Performed as group-led Writer Performed Yes    VAD Patient? No    PAD/SET Patient? No      Pain Assessment   Currently in Pain? No/denies    Pain Score 0-No pain    Multiple Pain Sites No             Capillary Blood Glucose: No results found for this or any previous visit (from the past 24 hours).    Social History   Tobacco Use  Smoking Status Former   Current packs/day: 0.00   Average packs/day: 2.0 packs/day for 23.0 years (46.0 ttl pk-yrs)   Types: Cigarettes   Start date: 57   Quit date: 1989   Years since quitting: 36.0   Passive exposure: Past  Smokeless Tobacco Never    Goals Met:  Proper associated with RPD/PD & O2 Sat Independence with exercise equipment Exercise tolerated well No report of concerns or symptoms today Strength training completed today  Goals Unmet:  Not Applicable  Comments: Service time is from 1012 to 1148.    Dr. Mechele Collin is Medical Director for Pulmonary Rehab at [facility]

## 2023-11-22 ENCOUNTER — Encounter (HOSPITAL_COMMUNITY)
Admission: RE | Admit: 2023-11-22 | Discharge: 2023-11-22 | Disposition: A | Discharge status: Home / Self Care | Payer: Medicare HMO | Source: Ambulatory Visit | Attending: Internal Medicine

## 2023-11-22 DIAGNOSIS — J84112 Idiopathic pulmonary fibrosis: Secondary | ICD-10-CM | POA: Diagnosis not present

## 2023-11-22 NOTE — Progress Notes (Signed)
 Daily Session Note  Patient Details  Name: Vincent Meyers MRN: [id number] Date of Birth: Mar 23, 1946 Referring Provider:   Conrad Ports Pulmonary Rehab Walk Test from 10/07/2023 in [facility] for Heart, Vascular, & Lung Health  Referring Provider Ramaswamy       Encounter Date: 11/22/2023  Check In:  Session Check In - 11/22/23 1025       Check-In   Supervising physician immediately available to respond to emergencies [facility] MD immediately available    Physician(s) Orren Fabry, NP    Location [facility]    Staff Present Ronal Levin, RN, BSN;Casey Smith, RT;Randi [place] BS, ACSM-CEP, Exercise Physiologist    Virtual Visit No    Medication changes reported     No    Fall or balance concerns reported    No    Tobacco Cessation No Change    Warm-up and Cool-down Performed as group-led instruction    Resistance Training Performed Yes    VAD Patient? No    PAD/SET Patient? No      Pain Assessment   Currently in Pain? No/denies    Multiple Pain Sites No             Capillary Blood Glucose: No results found for this or any previous visit (from the past 24 hours).    Social History   Tobacco Use  Smoking Status Former   Current packs/day: 0.00   Average packs/day: 2.0 packs/day for 23.0 years (46.0 ttl pk-yrs)   Types: Cigarettes   Start date: 27   Quit date: 1989   Years since quitting: 36.0   Passive exposure: Past  Smokeless Tobacco Never    Goals Met:  Independence with exercise equipment Exercise tolerated well No report of concerns or symptoms today Strength training completed today  Goals Unmet:  Not Applicable  Comments: Service time is from 1015 to 1141    Dr. Slater Staff is Medical Director for Pulmonary Rehab at [facility].

## 2023-11-24 ENCOUNTER — Encounter (HOSPITAL_COMMUNITY)
Admission: RE | Admit: 2023-11-24 | Discharge: 2023-11-24 | Disposition: A | Discharge status: Home / Self Care | Payer: Medicare HMO | Source: Ambulatory Visit | Attending: Internal Medicine | Admitting: Internal Medicine

## 2023-11-24 DIAGNOSIS — J84112 Idiopathic pulmonary fibrosis: Secondary | ICD-10-CM | POA: Diagnosis not present

## 2023-11-24 NOTE — Progress Notes (Signed)
 Daily Session Note  Patient Details  Name: Vincent Meyers MRN: [id number] Date of Birth: [birth year] Referring Provider:   Conrad Ports Pulmonary Rehab Walk Test from 10/07/2023 in [facility] for Heart, Vascular, & Lung Health  Referring Provider Ramaswamy       Encounter Date: 11/24/2023  Check In:  Session Check In - 11/24/23 1109       Check-In   Supervising physician immediately available to respond to emergencies [facility] MD immediately available    Physician(s) Rosaline Skains, NP    Location [facility]    Staff Present Ronal Levin, RN, BSN;Casey Smith, RT;Randi [place] BS, ACSM-CEP, Exercise Physiologist    Virtual Visit No    Medication changes reported     No    Fall or balance concerns reported    No    Tobacco Cessation No Change    Warm-up and Cool-down Performed as group-led instruction    Resistance Training Performed Yes    VAD Patient? No    PAD/SET Patient? No      Pain Assessment   Currently in Pain? No/denies    Multiple Pain Sites No             Capillary Blood Glucose: No results found for this or any previous visit (from the past 24 hours).    Social History   Tobacco Use  Smoking Status Former   Current packs/day: 0.00   Average packs/day: 2.0 packs/day for 23.0 years (46.0 ttl pk-yrs)   Types: Cigarettes   Start date: 63   Quit date: 1989   Years since quitting: 36.0   Passive exposure: Past  Smokeless Tobacco Never    Goals Met:  Independence with exercise equipment Exercise tolerated well No report of concerns or symptoms today Strength training completed today  Goals Unmet:  Not Applicable  Comments: Service time is from 1015 to 1149    Dr. Slater Staff is Medical Director for Pulmonary Rehab at [facility].

## 2023-11-25 ENCOUNTER — Ambulatory Visit: Payer: Medicare HMO | Admitting: Urology

## 2023-11-28 ENCOUNTER — Ambulatory Visit: Payer: Medicare HMO | Admitting: Internal Medicine

## 2023-11-28 DIAGNOSIS — J84112 Idiopathic pulmonary fibrosis: Secondary | ICD-10-CM

## 2023-11-28 LAB — PULMONARY FUNCTION TEST
FEF 25-75 Pre: 2.42 L/s
FEF2575-%Pred-Pre: 121 %
FEV1-%Pred-Pre: 83 %
FEV1-Pre: 2.33 L
FEV1FVC-%Pred-Pre: 120 %
FEV6-%Pred-Pre: 73 %
FEV6-Pre: 2.67 L
FEV6FVC-%Pred-Pre: 107 %
FVC-%Pred-Pre: 68 %
FVC-Pre: 2.67 L
Pre FEV1/FVC ratio: 87 %
Pre FEV6/FVC Ratio: 100 %

## 2023-11-28 NOTE — Progress Notes (Signed)
Spirometry only performed today.

## 2023-11-28 NOTE — Patient Instructions (Signed)
Spirometry only performed today.

## 2023-11-29 ENCOUNTER — Encounter (HOSPITAL_COMMUNITY)
Admission: RE | Admit: 2023-11-29 | Discharge: 2023-11-29 | Disposition: A | Discharge status: Home / Self Care | Payer: Medicare HMO | Source: Ambulatory Visit | Attending: Internal Medicine

## 2023-11-29 VITALS — Wt 215.2 lb

## 2023-11-29 DIAGNOSIS — J84112 Idiopathic pulmonary fibrosis: Secondary | ICD-10-CM | POA: Diagnosis not present

## 2023-11-29 NOTE — Progress Notes (Signed)
 Daily Session Note  Patient Details  Name: Vincent Meyers MRN: [id number] Date of Birth: Mar 02, 1946 Referring Provider:   Conrad Ports Pulmonary Rehab Walk Test from 10/07/2023 in [facility] for Heart, Vascular, & Lung Health  Referring Provider Ramaswamy       Encounter Date: 11/29/2023  Check In:  Session Check In - 11/29/23 1016       Check-In   Supervising physician immediately available to respond to emergencies [facility] MD immediately available    Physician(s) Rosaline Skains, NP    Location [facility]    Staff Present Ronal Levin, RN, BSN;Casey Claudene, RT;Ashey [facility]ana [facility] BS, ACSM-CEP, Exercise Physiologist;Kaylee Nicholaus, MS, ACSM-CEP, Exercise Physiologist    Virtual Visit No    Medication changes reported     No    Fall or balance concerns reported    No    Tobacco Cessation No Change    Warm-up and Cool-down Performed as group-led instruction    Resistance Training Performed Yes    VAD Patient? No    PAD/SET Patient? No      Pain Assessment   Currently in Pain? No/denies    Multiple Pain Sites No             Capillary Blood Glucose: No results found for this or any previous visit (from the past 24 hours).   Exercise Prescription Changes - 11/29/23 1100       Response to Exercise   Blood Pressure (Admit) 126/62    Blood Pressure (Exercise) 142/64    Blood Pressure (Exit) 120/64    Heart Rate (Admit) 73 bpm    Heart Rate (Exercise) 86 bpm    Heart Rate (Exit) 69 bpm    Oxygen Saturation (Admit) 96 %    Oxygen Saturation (Exercise) 88 %    Oxygen Saturation (Exit) 97 %    Rating of Perceived Exertion (Exercise) 13    Perceived Dyspnea (Exercise) 1    Duration Continue with 30 min of aerobic exercise without signs/symptoms of physical distress.    Intensity THRR unchanged      Progression   Progression Continue to progress workloads to maintain intensity without signs/symptoms of physical distress.      Resistance  Training   Training Prescription Yes    Weight blue bands    Reps 10-15    Time 10 Minutes      Oxygen   Oxygen Continuous    Liters 2-3      Recumbant Elliptical   Level 5    Minutes 15    METs 3      Track   Laps 8.5    Minutes 15    METs 2.3             Social History   Tobacco Use  Smoking Status Former   Current packs/day: 0.00   Average packs/day: 2.0 packs/day for 23.0 years (46.0 ttl pk-yrs)   Types: Cigarettes   Start date: 73   Quit date: 1989   Years since quitting: 36.0   Passive exposure: Past  Smokeless Tobacco Never    Goals Met:  Independence with exercise equipment Exercise tolerated well No report of concerns or symptoms today Strength training completed today  Goals Unmet:  Not Applicable  Comments: Service time is from 1009 to 1137.    Dr. Slater Staff is Medical Director for Pulmonary Rehab at [facility].

## 2023-12-01 ENCOUNTER — Encounter (HOSPITAL_COMMUNITY)
Admission: RE | Admit: 2023-12-01 | Discharge: 2023-12-01 | Disposition: A | Discharge status: Home / Self Care | Payer: Medicare HMO | Source: Ambulatory Visit | Attending: Internal Medicine | Admitting: Internal Medicine

## 2023-12-01 ENCOUNTER — Other Ambulatory Visit: Payer: Self-pay | Admitting: Internal Medicine

## 2023-12-01 DIAGNOSIS — Z006 Encounter for examination for normal comparison and control in clinical research program: Secondary | ICD-10-CM

## 2023-12-01 DIAGNOSIS — J84112 Idiopathic pulmonary fibrosis: Secondary | ICD-10-CM

## 2023-12-01 NOTE — Progress Notes (Signed)
 Daily Session Note  Patient Details  Name: Vincent Meyers MRN: [id number] Date of Birth: [birth year] Referring Provider:   Conrad Ports Pulmonary Rehab Walk Test from 10/07/2023 in [facility] for Heart, Vascular, & Lung Health  Referring Provider Ramaswamy       Encounter Date: 12/01/2023  Check In:  Session Check In - 12/01/23 1022       Check-In   Supervising physician immediately available to respond to emergencies [facility] MD immediately available    Physician(s) Barnie Press, NP    Location [facility]    Staff Present Ronal Levin, RN, BSN;Oskar Cretella Claudene, RT;Randi [facility] BS, ACSM-CEP, Exercise Physiologist;Kaylee Nicholaus, MS, ACSM-CEP, Exercise Physiologist    Virtual Visit No    Medication changes reported     No    Fall or balance concerns reported    No    Tobacco Cessation No Change    Warm-up and Cool-down Performed as group-led instruction    Resistance Training Performed Yes    VAD Patient? No    PAD/SET Patient? No      Pain Assessment   Currently in Pain? No/denies    Multiple Pain Sites No             Capillary Blood Glucose: No results found for this or any previous visit (from the past 24 hours).    Social History   Tobacco Use  Smoking Status Former   Current packs/day: 0.00   Average packs/day: 2.0 packs/day for 23.0 years (46.0 ttl pk-yrs)   Types: Cigarettes   Start date: 98   Quit date: 1989   Years since quitting: 36.0   Passive exposure: Past  Smokeless Tobacco Never    Goals Met:  Proper associated with RPD/PD & O2 Sat Independence with exercise equipment Exercise tolerated well No report of concerns or symptoms today Strength training completed today  Goals Unmet:  Not Applicable  Comments: Service time is from 1012 to 1145.    Dr. Slater Staff is Medical Director for Pulmonary Rehab at [facility].

## 2023-12-02 ENCOUNTER — Other Ambulatory Visit: Payer: Self-pay

## 2023-12-02 ENCOUNTER — Ambulatory Visit: Payer: Medicare HMO | Admitting: Internal Medicine

## 2023-12-02 NOTE — Progress Notes (Signed)
 Specialty Pharmacy Refill Coordination Note  Vincent Meyers is a 78 y.o. male contacted today regarding refills of specialty medication(s) Pirfenidone    Patient requested Delivery   Delivery date: 12/08/23   Verified address: [address]   Medication will be filled on 12/07/23.

## 2023-12-02 NOTE — Progress Notes (Signed)
 Specialty Pharmacy Ongoing Clinical Assessment Note  Vincent Meyers is a 78 y.o. male who is being followed by the specialty pharmacy service for RxSp Interstitial Lung Disease   Patient's specialty medication(s) reviewed today: Pirfenidone    Missed doses in the last 4 weeks: 0   Patient/Caregiver did not have any additional questions or concerns.   Therapeutic benefit summary: Unable to assess   Adverse events/side effects summary: No adverse events/side effects   Patient's therapy is appropriate to: Continue    Goals Addressed             This Visit's Progress    Stabilization of disease       Patient is on track. Patient will maintain adherence         Follow up:  6 months  Mitzie GORMAN [facility] Specialty Pharmacist

## 2023-12-05 ENCOUNTER — Ambulatory Visit (HOSPITAL_COMMUNITY)
Admission: RE | Admit: 2023-12-05 | Discharge: 2023-12-05 | Disposition: A | Discharge status: Home / Self Care | Payer: Medicare HMO | Source: Ambulatory Visit | Attending: Internal Medicine | Admitting: Internal Medicine

## 2023-12-05 DIAGNOSIS — R911 Solitary pulmonary nodule: Secondary | ICD-10-CM | POA: Diagnosis not present

## 2023-12-05 LAB — GLUCOSE, CAPILLARY: Glucose-Capillary: 169 mg/dL — ABNORMAL HIGH (ref 70–99)

## 2023-12-05 MED ORDER — FLUDEOXYGLUCOSE F - 18 (FDG) INJECTION
10.7000 | Freq: Once | INTRAVENOUS | Status: AC
  Administered 2023-12-05: 10.7 via INTRAVENOUS

## 2023-12-06 ENCOUNTER — Ambulatory Visit (HOSPITAL_BASED_OUTPATIENT_CLINIC_OR_DEPARTMENT_OTHER)
Admission: RE | Admit: 2023-12-06 | Discharge: 2023-12-06 | Disposition: A | Discharge status: Home / Self Care | Payer: Self-pay | Source: Ambulatory Visit | Attending: Internal Medicine | Admitting: Internal Medicine

## 2023-12-06 ENCOUNTER — Encounter (HOSPITAL_COMMUNITY)
Admission: RE | Admit: 2023-12-06 | Discharge: 2023-12-06 | Disposition: A | Discharge status: Home / Self Care | Payer: Medicare HMO | Source: Ambulatory Visit | Attending: Internal Medicine

## 2023-12-06 DIAGNOSIS — J8410 Pulmonary fibrosis, unspecified: Secondary | ICD-10-CM | POA: Diagnosis not present

## 2023-12-06 DIAGNOSIS — Z006 Encounter for examination for normal comparison and control in clinical research program: Secondary | ICD-10-CM

## 2023-12-06 DIAGNOSIS — J84112 Idiopathic pulmonary fibrosis: Secondary | ICD-10-CM

## 2023-12-06 DIAGNOSIS — I700 Atherosclerosis of aorta: Secondary | ICD-10-CM | POA: Diagnosis not present

## 2023-12-06 NOTE — Progress Notes (Signed)
 Daily Session Note  Patient Details  Name: Vincent Meyers MRN: [id number] Date of Birth: [birth year] Referring Provider:   Conrad Ports Pulmonary Rehab Walk Test from 10/07/2023 in [facility] for Heart, Vascular, & Lung Health  Referring Provider Ramaswamy       Encounter Date: 12/06/2023  Check In:  Session Check In - 12/06/23 1026       Check-In   Supervising physician immediately available to respond to emergencies [facility] MD immediately available    Physician(s) Lamarr Satterfield, NP    [facility]    Staff Present Ronal Levin, RN, BSN;Andric Kerce Claudene, RT;Randi [facility] BS, ACSM-CEP, Exercise Physiologist;Kaylee Nicholaus, MS, ACSM-CEP, Exercise Physiologist    Virtual Visit No    Medication changes reported     No    Fall or balance concerns reported    No    Tobacco Cessation No Change    Warm-up and Cool-down Performed as group-led instruction    Resistance Training Performed Yes    VAD Patient? No    PAD/SET Patient? No      Pain Assessment   Currently in Pain? No/denies    Multiple Pain Sites No             Capillary Blood Glucose: No results found for this or any previous visit (from the past 24 hours).    Social History   Tobacco Use  Smoking Status Former   Current packs/day: 0.00   Average packs/day: 2.0 packs/day for 23.0 years (46.0 ttl pk-yrs)   Types: Cigarettes   Start date: 14   Quit date: 1989   Years since quitting: 36.0   Passive exposure: Past  Smokeless Tobacco Never    Goals Met:  Proper associated with RPD/PD & O2 Sat Independence with exercise equipment Exercise tolerated well No report of concerns or symptoms today Strength training completed today  Goals Unmet:  Not Applicable  Comments: Service time is from 1013 to 1135.    Dr. Slater Staff is Medical Director for Pulmonary Rehab at [facility].

## 2023-12-07 ENCOUNTER — Encounter (INDEPENDENT_AMBULATORY_CARE_PROVIDER_SITE_OTHER): Payer: Medicare HMO | Admitting: Internal Medicine

## 2023-12-07 ENCOUNTER — Other Ambulatory Visit: Payer: Self-pay

## 2023-12-07 ENCOUNTER — Encounter: Payer: Medicare HMO | Admitting: Internal Medicine

## 2023-12-07 DIAGNOSIS — Z006 Encounter for examination for normal comparison and control in clinical research program: Secondary | ICD-10-CM

## 2023-12-07 DIAGNOSIS — J84112 Idiopathic pulmonary fibrosis: Secondary | ICD-10-CM

## 2023-12-08 ENCOUNTER — Ambulatory Visit: Payer: Medicare HMO | Admitting: Urology

## 2023-12-08 ENCOUNTER — Encounter (HOSPITAL_COMMUNITY)
Admission: RE | Admit: 2023-12-08 | Discharge: 2023-12-08 | Disposition: A | Discharge status: Home / Self Care | Payer: Medicare HMO | Source: Ambulatory Visit | Attending: Internal Medicine | Admitting: Internal Medicine

## 2023-12-08 ENCOUNTER — Encounter: Payer: Self-pay | Admitting: Urology

## 2023-12-08 VITALS — BP 159/85 | HR 71 | Ht 67.5 in | Wt 215.0 lb

## 2023-12-08 DIAGNOSIS — J84112 Idiopathic pulmonary fibrosis: Secondary | ICD-10-CM | POA: Diagnosis not present

## 2023-12-08 DIAGNOSIS — N529 Male erectile dysfunction, unspecified: Secondary | ICD-10-CM | POA: Diagnosis not present

## 2023-12-08 DIAGNOSIS — N138 Other obstructive and reflux uropathy: Secondary | ICD-10-CM

## 2023-12-08 DIAGNOSIS — R3589 Other polyuria: Secondary | ICD-10-CM | POA: Diagnosis not present

## 2023-12-08 DIAGNOSIS — N401 Enlarged prostate with lower urinary tract symptoms: Secondary | ICD-10-CM

## 2023-12-08 LAB — URINALYSIS, ROUTINE W REFLEX MICROSCOPIC
Bilirubin, UA: NEGATIVE
Glucose, UA: NEGATIVE
Ketones, UA: NEGATIVE
Leukocytes,UA: NEGATIVE
Nitrite, UA: NEGATIVE
Protein,UA: NEGATIVE
RBC, UA: NEGATIVE
Specific Gravity, UA: 1.015 (ref 1.005–1.030)
Urobilinogen, Ur: 0.2 mg/dL (ref 0.2–1.0)
pH, UA: 6 (ref 5.0–7.5)

## 2023-12-08 LAB — BLADDER SCAN AMB NON-IMAGING

## 2023-12-08 NOTE — Progress Notes (Signed)
Daily Session Note  Patient Details  Name: Vincent Meyers MRN: [id number] Date of Birth: [birth year] Referring Provider:   Doristine Devoid Pulmonary Rehab Walk Test from 10/07/2023 in [facility] for Heart, Vascular, & Lung Health  Referring Provider Ramaswamy       Encounter Date: 12/08/2023  Check In:  Session Check In - 12/08/23 1026       Check-In   Supervising physician immediately available to respond to emergencies [facility] MD immediately available    Physician(s) Joni Reining, NP    [facility]    Staff Present Essie Hart, RN, BSN;Casey Katrinka Blazing, RT;Randi [facility] BS, ACSM-CEP, Exercise Physiologist;Kaylee Earlene Plater, MS, ACSM-CEP, Exercise Physiologist    Virtual Visit No    Medication changes reported     No    Fall or balance concerns reported    No    Tobacco Cessation No Change    Warm-up and Cool-down Performed as group-led instruction    Resistance Training Performed Yes    VAD Patient? No    PAD/SET Patient? No      Pain Assessment   Currently in Pain? No/denies    Pain Score 0-No pain    Multiple Pain Sites No             Capillary Blood Glucose: No results found for this or any previous visit (from the past 24 hours).    Social History   Tobacco Use  Smoking Status Former   Current packs/day: 0.00   Average packs/day: 2.0 packs/day for 23.0 years (46.0 ttl pk-yrs)   Types: Cigarettes   Start date: 36   Quit date: 1989   Years since quitting: 36.0   Passive exposure: Past  Smokeless Tobacco Never    Goals Met:  Exercise tolerated well No report of concerns or symptoms today Strength training completed today  Goals Unmet:  Not Applicable  Comments: Service time is from 1012 to 1140    Dr. Mechele Collin is Medical Director for Pulmonary Rehab at [facility].

## 2023-12-08 NOTE — Progress Notes (Signed)
Assessment: 1. BPH with obstruction/lower urinary tract symptoms   2. Organic impotence   3. Polyuria     Plan: Repeat voiding diary Continue tadalafil 10-20 mg prn intercourse.  I discussed a trial of an alternative medication.  He does not wish to pursue this at this time.  I also discussed further evaluation with cystoscopy.  He also does not wish to pursue any additional evaluation. I recommended that we monitor his symptoms at this time. Return to office prn  Chief Complaint:  Chief Complaint  Patient presents with   Benign Prostatic Hypertrophy    History of Present Illness:  Vincent Meyers is a 79 y.o. male who is seen for further evaluation of lower urinary tract symptoms and erectile dysfunction.  He has a 10-year history of lower urinary tract symptoms with some gradual worsening.  He reported frequency voiding every 2 hours, nocturia x 4, hesitancy, intermittent stream, weak stream, and postvoid dribbling.  No dysuria or gross hematuria. IPSS = 23 QOL = 4. He was previously on tadalafil 5 mg daily but did not see any significant improvement in his urinary symptoms.  No other medical therapy. PSA from 7/21: 1.52 PSA from 9/24:  3.5 PVR = 187 ml. He was given a trial of alfuzosin 10 mg daily in 9/24.  He also reported a several year history of erectile dysfunction.  He is able to achieve a partial erection but has rapid detumescence.  No pain or curvature with erections.  No decrease in his libido.  He did not see any significant improvement in his erectile dysfunction while on the daily tadalafil.  He was using sildenafil 20 mg as needed which improved his erections but he had significant flushing after taking the medication.  He was given a trial of tadalafil 20 mg as needed intercourse.  At his visit in 10/24, he had only taken the alfuzosin for approximately 1 week.  He initially started taking medication and had a severe case of diarrhea so stopped it due to  concerns of possible side effect.  After resuming the medication, he had not had any problems with diarrhea.  He was unsure if there was any improvement in his symptoms.  He felt some improvement in his urine stream and bladder emptying.  He continued to have nocturia x 4 and daytime frequency.  No dysuria or gross hematuria. IPSS = 21 QOL = 3.  At his visit in 11/24, he had resumed the alfuzosin.  He continued to report significant urinary symptoms including frequency, intermittency, decreased stream, and nocturia x 5.  He performed a voiding diary but did not bring the forms with him.  He states that he is putting out over 6 L of urine per day and was also taking in 6 L of fluid per day.  He stated that he is constantly thirsty. No dysuria or gross hematuria. IPSS = 25.  PVR = 10 ml. BMP showed a low sodium and elevated glucose.  Further evaluation by his PCP was recommended.  He returns today for follow-up.  He reports that he has decreased his fluid intake during the day.  He has also discontinued the alfuzosin as he not feel like this was improving his symptoms.  He continues with frequency, weak stream, intermittent stream, nocturia x 4, and incontinence.  No dysuria or gross hematuria. IPSS = 22 today.  Portions of the above documentation were copied from a prior visit for review purposes only.   Past Medical History:  Past Medical History:  Diagnosis Date   Acid reflux    Angina pectoris (HCC)    Anxiety    Arthritis    Coronary artery disease    Erectile dysfunction    Hyperlipidemia    Hypertension    IBS (irritable bowel syndrome)    ILD (interstitial lung disease) (HCC)    OSA (obstructive sleep apnea)     Past Surgical History:  Past Surgical History:  Procedure Laterality Date   APPENDECTOMY     COLON SURGERY     perforated bowel and hernia repair   ILIAC ARTERY ANEURYSM REPAIR Left 2006   Dr. Arbie Cookey (redo surgery)   REPLACEMENT TOTAL KNEE BILATERAL     RIGHT HEART  CATH N/A 09/09/2023   Procedure: RIGHT HEART CATH;  Surgeon: Laurey Morale, MD;  Location: [facility] INVASIVE CV LAB;  Service: Cardiovascular;  Laterality: N/A;   TONSILLECTOMY     age 33    Allergies:  No Known Allergies  Family History:  Family History  Problem Relation Age of Onset   Parkinson's disease Mother    Heart attack Father    Stroke Father    Alzheimer's disease Father    Alzheimer's disease Sister    Other Sister        degenerative muscle disease   Hypertension Daughter    Obesity Daughter    Heart murmur Daughter    Colon cancer Neg Hx    Esophageal cancer Neg Hx    Rectal cancer Neg Hx    Stomach cancer Neg Hx     Social History:  Social History   Tobacco Use   Smoking status: Former    Current packs/day: 0.00    Average packs/day: 2.0 packs/day for 23.0 years (46.0 ttl pk-yrs)    Types: Cigarettes    Start date: 1966    Quit date: 1989    Years since quitting: 36.0    Passive exposure: Past   Smokeless tobacco: Never  Vaping Use   Vaping status: Never Used  Substance Use Topics   Alcohol use: Not Currently    Alcohol/week: 2.0 standard drinks of alcohol    Types: 2 Standard drinks or equivalent per week    Comment: quit 7 months ago stated 12/23/22   Drug use: Never    ROS: Constitutional:  Negative for fever, chills, weight loss CV: Negative for chest pain, previous MI, hypertension Respiratory:  Negative for shortness of breath, wheezing, sleep apnea, frequent cough GI:  Negative for nausea, vomiting, bloody stool, GERD  Physical exam: BP (!) 159/85   Pulse 71   Ht 5' 7.5" (1.715 m)   Wt 215 lb (97.5 kg)   BMI 33.18 kg/m  GENERAL APPEARANCE:  Well appearing, well developed, well nourished, NAD HEENT:  Atraumatic, normocephalic, oropharynx clear NECK:  Supple without lymphadenopathy or thyromegaly ABDOMEN:  Soft, non-tender, no masses EXTREMITIES:  Moves all extremities well, without clubbing, cyanosis, or edema NEUROLOGIC:  Alert and  oriented x 3, normal gait, CN II-XII grossly intact MENTAL STATUS:  appropriate BACK:  Non-tender to palpation, No CVAT SKIN:  Warm, dry, and intact   Results: U/A: SG 1.105, negative  PVR = 93 ml

## 2023-12-08 NOTE — Progress Notes (Signed)
TITLE: A Phase 2, Randomized, Double-Blind, Placebo-Controlled Study to Evaluate the Safety and Efficacy of [id number] in Patients With Idiopathic Pulmonary Fibrosis  Protocol #: [id number] NCT: Sponsor:[employer]:  This is a randomized, double-blinded, placebo-controlled multicenter study to evaluate the safety and efficacy of [id number] in patients with IPF with or without standard-of-care. 2:1 randomization ratio to [id number] 150 mg BID or the matching placebo for  24 weeks.    Mechanism of Action Proline is one of the largest constituents of collagen. In IPF, there is excessive deposition of collagen. Prolyl-tRNA Synthetase (PRS) is an enzyme that conjugates proline. [id number] (Bersiporocin) is the world's first selective PRS inhibitor that decreases collagen formation and subsequent pro-fibrotic markers.  Administration  A dose of [id number] 150 mg BID will be administered orally in the fasted state or at least 2 hours after the last meal, for 24 weeks.    Adverse effects and risk Overall, when [id number] was administered concomitantly with Pirfenidone and Nintedanib, [id number] 150 mg enteric-coated tablet was generally well tolerated and safe.   Safety data from edition number [id number], abstracted in March 2023.   Gastrointestinal adverse reactions (e.g Diarrhea, abdominal pain, nausea, vomiting) followed by CNS (headache and dizziness) as below were the most commonly observed adverse reactions.  Most adverse reactions were mild or moderate and reversible.  An improved enteric-coated 150 mg tablet (new) was reformulated to lower the initial dissolution rate in pH 6.8. Enteric-coated tablets (new) demonstrated that nausea and vomiting appear to have decreased. While the incidence rate of diarrhea was slightly increased than old formulation administration, the severity was all mild.  Below table comprises predominantly of enteric coated tablet.  Overall  adverse event % n=229 Average of 4 different studies [id number] n=24(part 1) Esbriet combination [id number] n=24(part 2) Ofev combination   Nausea 54 out of 229 23.58% 6(25%) 2(8.33%)  Vomiting 37 out of 205 18.04% 1(4.17%) 1(4.17%)  Diarrhea 37 out of 229 16.15% 2(8.33%) 7(29.17%)  Abdominal pain 7 out of 229 3.05% 0 1(4.17%)  Constipation 4 out of 54  7.4% - -  Abdominal discomfort 4 out of 205  1.95% 0 1(4.17%)  Headache 25 out of 205  12.19% 4(16.67%) 1(4.17%)  Dizziness 8 out of 175 (4.57%) 3(12.50%) 1(4.17%)   Severity of TEAE Table  Overall adverse event % n=229 Average of 4 different studies [id number] n=24(part 1) Esbriet combination [id number] n=24(part 2) Ofev combination   TEAE - occurrences  382 19 18  Severity- mild 351 (91.88%) 18 (94.73%) 17 (94.44%)   Moderate 31 (8.11%) 1 (5.26%) 1 (5.55%)  Severe 0 (0%) 0 (0%)  0 (0%)     91% TEAEs were mild  (96 of 106 TEAEs, including all 16 TEAEs in the placebo group). 9% (n=10) were moderate.   TEAEs occurred in a dose dependent manner with 7 of the 10 occurring in daily doses >= 600mg   All moderate TEAEs were classed as Gastrointestinal Disorders (nausea, vomiting, diarrhea, and epigastric discomfort), were considered recovered/resolved within 24 hours, and had no sequelae. There were no severe, life-threatening or fatal TEAEs across the study. There were no subjects with serious TEAEs, and there were no subjects with TEAEs leading to IP discontinuation during Part 1 (SAD) of the study.  EKG concerns There were no effects on ECG with dose up to 80 mg/kg in cynomolgus monkeys. In humans no EKG abnormalities reported  except in Part 2 ([id number] administered concomitantly with Nintedanib), one case of PR prolongation LFT concerns In  rats, reversible, minimal DWN12088HCl-related centrilobular hepatocyte hypertrophy was noted in the liver administered >=50 mg/kg/day and was considered consistent with  DWN12088HCl-related induction of hepatocellular enzymes.  In humans, on investigation in the MAD study of six subjects, hepatic enzyme was increased in one participant (16.7%).   Rare concerns based on animal data - not seen in humans Excessive salivation in rats  at doses >50 mg/kg/day and mildly diminished appetite in 1 monkey was observed on one occasion.  With extremely high doses of 1200 mg/kg decreased activity, increased/labored/shallow respiration, gasping, vocalizing and piloerection was observed in male (but not male) rats 6h post dose.  Zzzzzzzzzzzzzzzzz   This visit for Subject Vincent Meyers with DOB: 1946/08/05 on 1/156/2025 for the above protocol is Visit/Encounter # research  and is for purpose of RANDOMIZATION . Subject/LAR expressed continued interest and consent in continuing as a study subject. Subject thanked for participation in research and contribution to science.   S: he had FLUIDDA scan 12/06/23. On 12/05/23 he had PET scan for nodule - officeial report is no activity. He has no interim complaints. There are some incidential findings on PET scan that we will consider AE   Exam done  I/E reviewed including con meds, pregnant partnet and scrrening lab   Meets criteria for randomization   A IPF Research  P Randomizsation per protocol     SIGNATURE    Dr. Kalman Shan, M.D., F.C.C.P, ACRP-CPI Pulmonary and Critical Care Medicine Research Investigator, [facility] @ [facility] Staff Physician, [facility] Center Director - Interstitial Lung Disease  Program  Pulmonary Fibrosis [facility] [place] Pulmonary and [facility] @ [facility] [place], [place], [address]   Pager: [phone], If no answer  OR between  19:00-7:00h: page [phone] Telephone (research): [phone]  8:42 AM 12/08/2023   8:42 AM 12/08/2023

## 2023-12-09 NOTE — Research (Signed)
 TITLE: A Phase 2, Randomized, Double-Blind, Placebo-Controlled Study to Evaluate the Safety and Efficacy of [id number] in Patients With Idiopathic Pulmonary Fibrosis  Protocol #: [id number] NCT: Sponsor:[employer]  Protocol Version as of 12/07/2023 is 6.0 and dated  19Apr2024  Consent Version as of 12/07/2023 is v5  and dated 09Jan2024  Investigator Brochure as of 12/07/2023 is 8.0  and dated 20Jul2022  Study Design:  This is a randomized, double-blinded, placebo-controlled multicenter study to evaluate the safety and efficacy of [id number] in patients with IPF with or without standard-of-care. 2:1 randomization ratio to [id number] 150 mg BID or the matching placebo for  24 weeks.    Mechanism of Action Proline is one of the largest constituents of collagen. In IPF, there is excessive deposition of collagen. Prolyl-tRNA Synthetase (PRS) is an enzyme that conjugates proline. [id number] (Bersiporocin) is the world's first selective PRS inhibitor that decreases collagen formation and subsequent pro-fibrotic markers.  Administration  A dose of [id number] 150 mg BID will be administered orally in the fasted state or at least 2 hours after the last meal, for 24 weeks.   Key Inclusion Criteria age = 40 years Documented diagnosis of IPF per the 2018 ATS/ERS/JRS/ALAT Clinical Practice Meeting all of the following criteria during the screening period:             FVC >= 40% predicted            DLCOcor >=25% to <= 80%              (FEV1)/FVC ratio >= 0.7             Able to walk at least 150 m in , resting SpO2 should be >= 88% with a maximum of 6L O2/min  On a stable dose of pirfenidone OR nintedanib for at least 3 months OR on neither pirfenidone nor nintedanib.  Key Exclusion Criteria Currently taking medication known as a strong CYP2D6 inhibitor OR taking medication known to be CYP2D6 inducers OR CYP2D6 substrate with narrow therapeutic index. GFR  < 30  mL/min/1.38m2 moderate to severe hepatic impairment (Child-Pugh B and C). Patients with =3upper limit of normal of alanine aminotransferase, aspartate aminotransferase or gamma-glutamyl transpeptidase. Abnormal ECG findings including but not limited to QTc >500 ms.  Pharmacokinetics Urine PK data indicate that renal elimination is not the major clearance pathway for [id number].   Adverse effects and risk Overall, when [id number] was administered concomitantly with Pirfenidone and Nintedanib, [id number] 150 mg enteric-coated tablet was generally well tolerated and safe.   Safety data from edition number [id number], abstracted in March 2023.   Gastrointestinal adverse reactions (e.g Diarrhea, abdominal pain, nausea, vomiting) followed by CNS (headache and dizziness) as below were the most commonly observed adverse reactions.  Most adverse reactions were mild or moderate and reversible.  An improved enteric-coated 150 mg tablet (new) was reformulated to lower the initial dissolution rate in pH 6.8. Enteric-coated tablets (new) demonstrated that nausea and vomiting appear to have decreased. While the incidence rate of diarrhea was slightly increased than old formulation administration, the severity was all mild.  Below table comprises predominantly of enteric coated tablet.  Overall adverse event % n=229 Average of 4 different studies [id number] n=24(part 1) Esbriet combination [id number] n=24(part 2) Ofev combination   Nausea 54 out of 229 23.58% 6(25%) 2(8.33%)  Vomiting 37 out of 205 18.04% 1(4.17%) 1(4.17%)  Diarrhea 37 out of 229 16.15% 2(8.33%) 7(29.17%)  Abdominal pain 7 out of 229 3.05% 0 1(4.17%)  Constipation  4 out of 54  7.4% - -  Abdominal discomfort 4 out of 205  1.95% 0 1(4.17%)  Headache 25 out of 205  12.19% 4(16.67%) 1(4.17%)  Dizziness 8 out of 175 (4.57%) 3(12.50%) 1(4.17%)   Severity of TEAE Table  Overall adverse event % n=229 Average of 4 different studies  [id number] n=24(part 1) Esbriet combination [id number] n=24(part 2) Ofev combination   TEAE - occurrences  382 19 18  Severity- mild 351 (91.88%) 18 (94.73%) 17 (94.44%)   Moderate 31 (8.11%) 1 (5.26%) 1 (5.55%)  Severe 0 (0%) 0 (0%)  0 (0%)     91% TEAEs were mild  (96 of 106 TEAEs, including all 16 TEAEs in the placebo group). 9% (n=10) were moderate.   TEAEs occurred in a dose dependent manner with 7 of the 10 occurring in daily doses >= 600mg   All moderate TEAEs were classed as Gastrointestinal Disorders (nausea, vomiting, diarrhea, and epigastric discomfort), were considered recovered/resolved within 24 hours, and had no sequelae. There were no severe, life-threatening or fatal TEAEs across the study. There were no subjects with serious TEAEs, and there were no subjects with TEAEs leading to IP discontinuation during Part 1 (SAD) of the study.  EKG concerns There were no effects on ECG with dose up to 80 mg/kg in cynomolgus monkeys. In humans no EKG abnormalities reported  except in Part 2 ([id number] administered concomitantly with Nintedanib), one case of PR prolongation LFT concerns In rats, reversible, minimal DWN12088HCl-related centrilobular hepatocyte hypertrophy was noted in the liver administered >=50 mg/kg/day and was considered consistent with DWN12088HCl-related induction of hepatocellular enzymes.  In humans, on investigation in the MAD study of six subjects, hepatic enzyme was increased in one participant (16.7%).   Rare concerns based on animal data - not seen in humans Excessive salivation in rats  at doses >50 mg/kg/day and mildly diminished appetite in 1 monkey was observed on one occasion.  With extremely high doses of 1200 mg/kg decreased activity, increased/labored/shallow respiration, gasping, vocalizing and piloerection was observed in male (but not male) rats 6h post dose.  [facility] Clinical Research Coordinator note:   This visit for  Subject Vincent Meyers with DOB: Aug 06, 1946 on 12/07/2023 for the above protocol is Visit 2/Randomization  and is for purpose of research.   The consent for this encounter is under Protocol Version 6.0, Investigator Brochure Version 8.0, Consent Version 5 and  is currently IRB approved.   Subject expressed continued interest and consent in continuing as a study subject. Subject confirmed that there was no change in contact information (e.g. address, telephone, email). Subject thanked for participation in research and contribution to science. In this visit 12/07/2023 the subject will be evaluated by Principal Investigator  named Kalman Shan MD. This research coordinator has verified that the above investigator is up to date with his/her training logs.   The Subject was  informed that the PI  continues to have oversight of the subject's visits and course through relevant discussions, reviews, and also specifically of this visit by routing of this note to the PI.   1. This visit is a key visit of  randomization. The PI is available for this visit.    2.  In addition, ahead of the key visit of  randomization the visit and subject were discussed with the PI on multiple dates to review eligibility.  All visit assessments were completed and subject was dispensed medication kit and first dose taken in office without difficulty. Patient  to return for next research visit in 4 weeks.   Signed by  Christell Constant MD  Clinical Research Coordinator [facility]  [place], [place] 5:47 PM 12/09/2023

## 2023-12-13 ENCOUNTER — Encounter (HOSPITAL_COMMUNITY)
Admission: RE | Admit: 2023-12-13 | Discharge: 2023-12-13 | Disposition: A | Discharge status: Home / Self Care | Payer: Medicare HMO | Source: Ambulatory Visit | Attending: Internal Medicine | Admitting: Internal Medicine

## 2023-12-13 VITALS — Wt 216.3 lb

## 2023-12-13 DIAGNOSIS — J84112 Idiopathic pulmonary fibrosis: Secondary | ICD-10-CM

## 2023-12-13 NOTE — Progress Notes (Signed)
Daily Session Note  Patient Details  Name: Vincent Meyers MRN: [id number] Date of Birth: 08-12-1946 Referring Provider:   Doristine Devoid Pulmonary Rehab Walk Test from 10/07/2023 in [facility]  Referring Provider Ramaswamy       Encounter Date: 12/13/2023  Check In:  Session Check In - 12/13/23 1130       Check-In   Supervising physician immediately available to respond to emergencies [facility] MD immediately available    Physician(s) Bernadene Person, NP    Location [facility]    Staff Present Essie Hart, RN, BSN;Casey Katrinka Blazing, Zella Richer, MS, ACSM-CEP, Exercise Physiologist;Johnny Hale Bogus, MS, Exercise Physiologist    Virtual Visit No    Medication changes reported     No    Fall or balance concerns reported    No    Tobacco Cessation No Change    Warm-up and Cool-down Performed as group-led instruction    Resistance Training Performed Yes    VAD Patient? No    PAD/SET Patient? No      Pain Assessment   Currently in Pain? No/denies    Multiple Pain Sites No             Capillary Blood Glucose: No results found for this or any previous visit (from the past 24 hours).   Exercise Prescription Changes - 12/13/23 1200       Response to Exercise   Blood Pressure (Admit) 110/62    Blood Pressure (Exercise) 121/58    Blood Pressure (Exit) 108/62    Heart Rate (Admit) 72 bpm    Heart Rate (Exercise) 85 bpm    Heart Rate (Exit) 70 bpm    Oxygen Saturation (Admit) 97 %    Oxygen Saturation (Exercise) 91 %    Oxygen Saturation (Exit) 97 %    Rating of Perceived Exertion (Exercise) 13    Perceived Dyspnea (Exercise) 1    Duration Continue with 30 min of aerobic exercise without signs/symptoms of physical distress.    Intensity THRR unchanged      Progression   Progression Continue to progress workloads to maintain intensity without signs/symptoms of physical distress.      Resistance Training    Training Prescription Yes    Weight blue bands    Reps 10-15    Time 10 Minutes      Oxygen   Oxygen Continuous    Liters 2-3      Recumbant Elliptical   Level 5    Watts 81    Minutes 15    METs 3.2      Track   Laps 9    Minutes 15    METs 2.38             Social History   Tobacco Use  Smoking Status Former   Current packs/day: 0.00   Average packs/day: 2.0 packs/day for 23.0 years (46.0 ttl pk-yrs)   Types: Cigarettes   Start date: 33   Quit date: 1989   Years since quitting: 36.0   Passive exposure: Past  Smokeless Tobacco Never    Goals Met:  Independence with exercise equipment Exercise tolerated well No report of concerns or symptoms today Strength training completed today  Goals Unmet:  Not Applicable  Comments: Service time is from 1017 to 1141    Dr. Mechele Collin is Medical Director for Pulmonary Rehab at [facility].

## 2023-12-14 NOTE — Progress Notes (Signed)
Pulmonary Individual Treatment Plan  Patient Details  Name: Vincent Meyers MRN: [id number] Date of Birth: 29-Jun-1946 Referring Provider:   Doristine Devoid Pulmonary Rehab Walk Test from 10/07/2023 in [facility]  Referring Provider Ramaswamy       Initial Encounter Date:  Flowsheet Row Pulmonary Rehab Walk Test from 10/07/2023 in [facility]  Date 10/07/23       Visit Diagnosis: IPF (idiopathic pulmonary fibrosis) (HCC)  Patient's Home Medications on Admission:   Current Outpatient Medications:    alfuzosin (UROXATRAL) 10 MG 24 hr tablet, Take 1 tablet (10 mg total) by mouth daily. (Patient not taking: Reported on 12/08/2023), Disp: 30 tablet, Rfl: 11   amLODipine (NORVASC) 5 MG tablet, TAKE 1 TABLET (5 MG TOTAL) BY MOUTH DAILY., Disp: 90 tablet, Rfl: 2   aspirin EC 81 MG tablet, Take 81 mg by mouth daily., Disp: , Rfl:    atenolol (TENORMIN) 50 MG tablet, TAKE 1 AND 1/2 TABLETS BY MOUTH DAILY, Disp: 135 tablet, Rfl: 1   FIBER PO, Take 4 capsules by mouth 2 (two) times daily., Disp: , Rfl:    Flaxseed, Linseed, (FLAXSEED OIL PO), Take 1 capsule by mouth daily., Disp: , Rfl:    fluticasone (FLONASE) 50 MCG/ACT nasal spray, Place 2 sprays into both nostrils daily., Disp: , Rfl:    hydrochlorothiazide (HYDRODIURIL) 25 MG tablet, TAKE 1 TABLET BY MOUTH EVERY DAY, Disp: 90 tablet, Rfl: 0   lisinopril (ZESTRIL) 40 MG tablet, TAKE 1 TABLET BY MOUTH EVERY DAY, Disp: 90 tablet, Rfl: 1   loperamide (IMODIUM A-D) 2 MG tablet, Take 2-4 mg by mouth 4 (four) times daily as needed for diarrhea or loose stools., Disp: , Rfl:    MAGNESIUM-POTASSIUM PO, Take 1 tablet by mouth daily. With zinc, Disp: , Rfl:    Misc Natural Products (TURMERIC CURCUMIN) CAPS, Take 1 capsule by mouth daily., Disp: , Rfl:    omeprazole (PRILOSEC) 20 MG capsule, TAKE 1 CAPSULE BY MOUTH EVERY DAY, Disp: 90  capsule, Rfl: 1   Pirfenidone 801 MG TABS, Take 1 tablet (801 mg total) by mouth 3 (three) times daily with meals., Disp: 270 tablet, Rfl: 5   simvastatin (ZOCOR) 40 MG tablet, TAKE 1 TABLET(40 MG) BY MOUTH EVERY NIGHT, Disp: 90 tablet, Rfl: 1   tadalafil (CIALIS) 20 MG tablet, Take 1 tablet (20 mg total) by mouth daily as needed., Disp: 10 tablet, Rfl: 11  Past Medical History: Past Medical History:  Diagnosis Date   Acid reflux    Angina pectoris (HCC)    Anxiety    Arthritis    Coronary artery disease    Erectile dysfunction    Hyperlipidemia    Hypertension    IBS (irritable bowel syndrome)    ILD (interstitial lung disease) (HCC)    OSA (obstructive sleep apnea)     Tobacco Use: Social History   Tobacco Use  Smoking Status Former   Current packs/day: 0.00   Average packs/day: 2.0 packs/day for 23.0 years (46.0 ttl pk-yrs)   Types: Cigarettes   Start date: 45   Quit date: 1989   Years since quitting: 36.0   Passive exposure: Past  Smokeless Tobacco Never    Labs: Review Flowsheet  More data exists      Latest Ref Rng & Units 10/19/2022 04/26/2023 07/29/2023 09/09/2023 10/31/2023  Labs for ITP Cardiac and Pulmonary Rehab  Hemoglobin A1c 4.8 -  5.6 % 7.0  6.9  7.0  - 7.1   Bicarbonate 20.0 - 28.0 mmol/L - - - 28.0  29.6  -  TCO2 22 - 32 mmol/L - - - 29  31  26   -  O2 Saturation % - - - 76  79  -    Details       Multiple values from one day are sorted in reverse-chronological order         Capillary Blood Glucose: Lab Results  Component Value Date   GLUCAP 169 (H) 12/05/2023     Pulmonary Assessment Scores:  Pulmonary Assessment Scores     Row Name 10/07/23 1429         ADL UCSD   ADL Phase Entry     SOB Score total 44       CAT Score   CAT Score 23       mMRC Score   mMRC Score 2             UCSD: Self-administered rating of dyspnea associated with activities of daily living (ADLs) 6-point scale (0 = "not at all" to 5 = "maximal or  unable to do because of breathlessness")  Scoring Scores range from 0 to 120.  Minimally important difference is 5 units  CAT: CAT can identify the health impairment of COPD patients and is better correlated with disease progression.  CAT has a scoring range of zero to 40. The CAT score is classified into four groups of low (less than 10), medium (10 - 20), high (21-30) and very high (31-40) based on the impact level of disease on health status. A CAT score over 10 suggests significant symptoms.  A worsening CAT score could be explained by an exacerbation, poor medication adherence, poor inhaler technique, or progression of COPD or comorbid conditions.  CAT MCID is 2 points  mMRC: mMRC (Modified Medical Research Council) Dyspnea Scale is used to assess the degree of baseline functional disability in patients of respiratory disease due to dyspnea. No minimal important difference is established. A decrease in score of 1 point or greater is considered a positive change.   Pulmonary Function Assessment:  Pulmonary Function Assessment - 10/07/23 1408       Breath   Bilateral Breath Sounds Rales;Basilar    Shortness of Breath Yes;Limiting activity             Exercise Target Goals: Exercise Program Goal: Individual exercise prescription set using results from initial 6 min walk test and THRR while considering  patient's activity barriers and safety.   Exercise Prescription Goal: Initial exercise prescription builds to 30-45 minutes a day of aerobic activity, 2-3 days per week.  Home exercise guidelines will be given to patient during program as part of exercise prescription that the participant will acknowledge.  Activity Barriers & Risk Stratification:  Activity Barriers & Cardiac Risk Stratification - 10/07/23 1359       Activity Barriers & Cardiac Risk Stratification   Activity Barriers Deconditioning;Muscular Weakness;Shortness of Breath;Arthritis;Balance Concerns;Joint  Problems;Left Knee Replacement;Right Knee Replacement    Cardiac Risk Stratification Moderate             6 Minute Walk:  6 Minute Walk     Row Name 10/07/23 1457         6 Minute Walk   Phase Initial     Distance 905 feet     Walk Time 6 minutes     # of Rest Breaks 0  MPH 1.71     METS 1.98     RPE 11     Perceived Dyspnea  2     VO2 Peak 6.94     Symptoms No     Resting HR 73 bpm     Resting BP 142/70     Resting Oxygen Saturation  96 %     Exercise Oxygen Saturation  during 6 min walk 89 %     Max Ex. HR 123 bpm     Max Ex. BP 152/68     2 Minute Post BP 138/68       Interval HR   1 Minute HR 80     2 Minute HR 109     3 Minute HR 104     4 Minute HR 123     5 Minute HR 105     6 Minute HR 102     2 Minute Post HR 71     Interval Heart Rate? Yes       Interval Oxygen   Interval Oxygen? Yes     Baseline Oxygen Saturation % 96 %     1 Minute Oxygen Saturation % 94 %     1 Minute Liters of Oxygen 2 L     2 Minute Oxygen Saturation % 90 %     2 Minute Liters of Oxygen 2 L     3 Minute Oxygen Saturation % 89 %     3 Minute Liters of Oxygen 2 L     4 Minute Oxygen Saturation % 89 %     4 Minute Liters of Oxygen 2 L     5 Minute Oxygen Saturation % 89 %     5 Minute Liters of Oxygen 2 L     6 Minute Oxygen Saturation % 91 %     6 Minute Liters of Oxygen 2 L     2 Minute Post Oxygen Saturation % 97 %     2 Minute Post Liters of Oxygen 2 L              Oxygen Initial Assessment:  Oxygen Initial Assessment - 10/07/23 1403       Home Oxygen   Home Oxygen Device Portable Concentrator;Home Concentrator    Sleep Oxygen Prescription CPAP    Liters per minute 2    Home Exercise Oxygen Prescription Pulsed    Liters per minute 4    Home Resting Oxygen Prescription Continuous    Liters per minute 2    Compliance with Home Oxygen Use Yes      Initial 6 min Walk   Oxygen Used Continuous    Liters per minute 2      Program Oxygen Prescription    Program Oxygen Prescription Continuous    Liters per minute 2      Intervention   Short Term Goals To learn and exhibit compliance with exercise, home and travel O2 prescription;To learn and understand importance of monitoring SPO2 with pulse oximeter and demonstrate accurate use of the pulse oximeter.;To learn and understand importance of maintaining oxygen saturations>88%;To learn and demonstrate proper pursed lip breathing techniques or other breathing techniques. ;To learn and demonstrate proper use of respiratory medications    Long  Term Goals Exhibits compliance with exercise, home  and travel O2 prescription;Maintenance of O2 saturations>88%;Compliance with respiratory medication;Verbalizes importance of monitoring SPO2 with pulse oximeter and return demonstration;Exhibits proper breathing techniques, such as pursed lip breathing or other method taught  during program session;Demonstrates proper use of MDI's             Oxygen Re-Evaluation:  Oxygen Re-Evaluation     Row Name 10/13/23 1605 11/07/23 0726 12/05/23 0922         Program Oxygen Prescription   Program Oxygen Prescription Continuous Continuous Continuous     Liters per minute 2 2 2        Home Oxygen   Home Oxygen Device Portable Concentrator;Home Concentrator Portable Concentrator;Home Concentrator Portable Concentrator;Home Concentrator     Sleep Oxygen Prescription CPAP CPAP CPAP     Liters per minute 2 2 2      Home Exercise Oxygen Prescription Pulsed Pulsed Pulsed     Liters per minute 4 4 4      Home Resting Oxygen Prescription Continuous Continuous Continuous     Liters per minute 2 2 2      Compliance with Home Oxygen Use Yes Yes Yes       Goals/Expected Outcomes   Short Term Goals To learn and exhibit compliance with exercise, home and travel O2 prescription;To learn and understand importance of monitoring SPO2 with pulse oximeter and demonstrate accurate use of the pulse oximeter.;To learn and understand  importance of maintaining oxygen saturations>88%;To learn and demonstrate proper pursed lip breathing techniques or other breathing techniques. ;To learn and demonstrate proper use of respiratory medications To learn and exhibit compliance with exercise, home and travel O2 prescription;To learn and understand importance of monitoring SPO2 with pulse oximeter and demonstrate accurate use of the pulse oximeter.;To learn and understand importance of maintaining oxygen saturations>88%;To learn and demonstrate proper pursed lip breathing techniques or other breathing techniques. ;To learn and demonstrate proper use of respiratory medications To learn and exhibit compliance with exercise, home and travel O2 prescription;To learn and understand importance of monitoring SPO2 with pulse oximeter and demonstrate accurate use of the pulse oximeter.;To learn and understand importance of maintaining oxygen saturations>88%;To learn and demonstrate proper pursed lip breathing techniques or other breathing techniques. ;To learn and demonstrate proper use of respiratory medications     Long  Term Goals Exhibits compliance with exercise, home  and travel O2 prescription;Maintenance of O2 saturations>88%;Compliance with respiratory medication;Verbalizes importance of monitoring SPO2 with pulse oximeter and return demonstration;Exhibits proper breathing techniques, such as pursed lip breathing or other method taught during program session;Demonstrates proper use of MDI's Exhibits compliance with exercise, home  and travel O2 prescription;Maintenance of O2 saturations>88%;Compliance with respiratory medication;Verbalizes importance of monitoring SPO2 with pulse oximeter and return demonstration;Exhibits proper breathing techniques, such as pursed lip breathing or other method taught during program session;Demonstrates proper use of MDI's Exhibits compliance with exercise, home  and travel O2 prescription;Maintenance of O2  saturations>88%;Compliance with respiratory medication;Verbalizes importance of monitoring SPO2 with pulse oximeter and return demonstration;Exhibits proper breathing techniques, such as pursed lip breathing or other method taught during program session;Demonstrates proper use of MDI's     Goals/Expected Outcomes Compliance and understanding of oxygen saturation monitoring and breathing techniques to decrease shortness of breath. Compliance and understanding of oxygen saturation monitoring and breathing techniques to decrease shortness of breath. Compliance and understanding of oxygen saturation monitoring and breathing techniques to decrease shortness of breath.              Oxygen Discharge (Final Oxygen Re-Evaluation):  Oxygen Re-Evaluation - 12/05/23 0922       Program Oxygen Prescription   Program Oxygen Prescription Continuous    Liters per minute 2      Home Oxygen  Home Oxygen Device Portable Concentrator;Home Concentrator    Sleep Oxygen Prescription CPAP    Liters per minute 2    Home Exercise Oxygen Prescription Pulsed    Liters per minute 4    Home Resting Oxygen Prescription Continuous    Liters per minute 2    Compliance with Home Oxygen Use Yes      Goals/Expected Outcomes   Short Term Goals To learn and exhibit compliance with exercise, home and travel O2 prescription;To learn and understand importance of monitoring SPO2 with pulse oximeter and demonstrate accurate use of the pulse oximeter.;To learn and understand importance of maintaining oxygen saturations>88%;To learn and demonstrate proper pursed lip breathing techniques or other breathing techniques. ;To learn and demonstrate proper use of respiratory medications    Long  Term Goals Exhibits compliance with exercise, home  and travel O2 prescription;Maintenance of O2 saturations>88%;Compliance with respiratory medication;Verbalizes importance of monitoring SPO2 with pulse oximeter and return demonstration;Exhibits  proper breathing techniques, such as pursed lip breathing or other method taught during program session;Demonstrates proper use of MDI's    Goals/Expected Outcomes Compliance and understanding of oxygen saturation monitoring and breathing techniques to decrease shortness of breath.             Initial Exercise Prescription:  Initial Exercise Prescription - 10/07/23 1500       Date of Initial Exercise RX and Referring Provider   Date 10/07/23    Referring Provider Ramaswamy    Expected Discharge Date 12/29/23      Oxygen   Oxygen Continuous    Liters 2    Maintain Oxygen Saturation 88% or higher      Recumbant Elliptical   Level 2    RPM 40    Watts 20    Minutes 15    METs 2      Track   Minutes 15    METs 2      Prescription Details   Frequency (times per week) 2    Duration Progress to 30 minutes of continuous aerobic without signs/symptoms of physical distress      Intensity   THRR 40-80% of Max Heartrate 58-115    Ratings of Perceived Exertion 11-13    Perceived Dyspnea 0-4      Progression   Progression Continue to progress workloads to maintain intensity without signs/symptoms of physical distress.      Resistance Training   Training Prescription Yes    Weight blue bands    Reps 10-15             Perform Capillary Blood Glucose checks as needed.  Exercise Prescription Changes:   Exercise Prescription Changes     Row Name 10/13/23 1034 11/01/23 1100 11/03/23 1500 11/15/23 1200 11/29/23 1100     Response to Exercise   Blood Pressure (Admit) 138/58 156/58 -- 142/62 126/62   Blood Pressure (Exercise) -- 132/66 -- 168/70 142/64   Blood Pressure (Exit) 122/60 128/60 -- 116/60 120/64   Heart Rate (Admit) 81 bpm 86 bpm -- 71 bpm 73 bpm   Heart Rate (Exercise) 87 bpm 87 bpm -- 82 bpm 86 bpm   Heart Rate (Exit) 71 bpm 74 bpm -- 74 bpm 69 bpm   Oxygen Saturation (Admit) 96 % 95 % -- 97 % 96 %   Oxygen Saturation (Exercise) 91 % 92 % -- 89 % 88 %    Oxygen Saturation (Exit) 96 % 92 % -- 96 % 97 %   Rating of Perceived Exertion (  Exercise) 13 13 -- 13 13   Perceived Dyspnea (Exercise) 2 1 -- 1 1   Duration Continue with 30 min of aerobic exercise without signs/symptoms of physical distress. Continue with 30 min of aerobic exercise without signs/symptoms of physical distress. -- Continue with 30 min of aerobic exercise without signs/symptoms of physical distress. Continue with 30 min of aerobic exercise without signs/symptoms of physical distress.   Intensity THRR unchanged THRR unchanged -- THRR unchanged THRR unchanged     Progression   Progression Continue to progress workloads to maintain intensity without signs/symptoms of physical distress. Continue to progress workloads to maintain intensity without signs/symptoms of physical distress. -- Continue to progress workloads to maintain intensity without signs/symptoms of physical distress. Continue to progress workloads to maintain intensity without signs/symptoms of physical distress.     Resistance Training   Training Prescription Yes Yes -- Yes Yes   Weight blue bands blue bands -- blue bands blue bands   Reps 10-15 10-15 -- 10-15 10-15   Time 10 Minutes 10 Minutes -- 10 Minutes 10 Minutes     Oxygen   Oxygen Continuous Continuous -- Continuous Continuous   Liters 2 2 -- 2 2-3     Recumbant Elliptical   Level 1 3 -- 3 5   Minutes 15 15 -- 15 15   METs 1.9 1.9 -- 2.9 3     Track   Laps 6 7 -- 9 8.5   Minutes 15 15 -- 15 15   METs 1.92 2.08 -- 2.38 2.3     Home Exercise Plan   Plans to continue exercise at -- -- [employer] (comment)  [facility] -- --   Frequency -- -- Add 1 additional day to program exercise sessions. -- --   Initial Home Exercises Provided -- -- 11/03/23 -- --     Oxygen   Maintain Oxygen Saturation 88% or higher 88% or higher -- 88% or higher --    Row Name 12/13/23 1200             Response to Exercise   Blood Pressure (Admit) 110/62        Blood Pressure (Exercise) 121/58       Blood Pressure (Exit) 108/62       Heart Rate (Admit) 72 bpm       Heart Rate (Exercise) 85 bpm       Heart Rate (Exit) 70 bpm       Oxygen Saturation (Admit) 97 %       Oxygen Saturation (Exercise) 91 %       Oxygen Saturation (Exit) 97 %       Rating of Perceived Exertion (Exercise) 13       Perceived Dyspnea (Exercise) 1       Duration Continue with 30 min of aerobic exercise without signs/symptoms of physical distress.       Intensity THRR unchanged         Progression   Progression Continue to progress workloads to maintain intensity without signs/symptoms of physical distress.         Resistance Training   Training Prescription Yes       Weight blue bands       Reps 10-15       Time 10 Minutes         Oxygen   Oxygen Continuous       Liters 2-3         Recumbant Elliptical   Level 5  Watts 81       Minutes 15       METs 3.2         Track   Laps 9       Minutes 15       METs 2.38                Exercise Comments:   Exercise Comments     Row Name 10/11/23 1555 11/03/23 1511         Exercise Comments Vincent Meyers has completed first day of exercise. He exercised for 15 min on the track and recumbent elliptical. He averaged 2.23 METs on the track and 1.9 METs at level 1 on the recumbent elliptical. He performed the warmup and cooldown standing without limitations. Discussed METs. Completed home exercise plan. Vincent Meyers is not currently exercising at home. He used to exercise at [facility] 3 days/wk for about 50 min/day until he started rehab. I encouraged Vincent Meyers to stay active and to return to exercise once he has graduated. Vincent Meyers agreed with my recommendations. I am unsure how likely he is to exercise while in rehab. Vincent Meyers seems more confident in exercising post rehab.               Exercise Goals and Review:   Exercise Goals     Row Name 10/07/23 1403             Exercise Goals   Increase Physical Activity Yes        Intervention Provide advice, education, support and counseling about physical activity/exercise needs.;Develop an individualized exercise prescription for aerobic and resistive training based on initial evaluation findings, risk stratification, comorbidities and participant's personal goals.       Expected Outcomes Short Term: Attend rehab on a regular basis to increase amount of physical activity.;Long Term: Add in home exercise to make exercise part of routine and to increase amount of physical activity.;Long Term: Exercising regularly at least 3-5 days a week.       Increase Strength and Stamina Yes       Intervention Provide advice, education, support and counseling about physical activity/exercise needs.;Develop an individualized exercise prescription for aerobic and resistive training based on initial evaluation findings, risk stratification, comorbidities and participant's personal goals.       Expected Outcomes Short Term: Increase workloads from initial exercise prescription for resistance, speed, and METs.;Short Term: Perform resistance training exercises routinely during rehab and add in resistance training at home;Long Term: Improve cardiorespiratory fitness, muscular endurance and strength as measured by increased METs and functional capacity ( )       Able to understand and use rate of perceived exertion (RPE) scale Yes       Intervention Provide education and explanation on how to use RPE scale       Expected Outcomes Short Term: Able to use RPE daily in rehab to express subjective intensity level;Long Term:  Able to use RPE to guide intensity level when exercising independently       Able to understand and use Dyspnea scale Yes       Intervention Provide education and explanation on how to use Dyspnea scale       Expected Outcomes Short Term: Able to use Dyspnea scale daily in rehab to express subjective sense of shortness of breath during exertion;Long Term: Able to use Dyspnea scale to  guide intensity level when exercising independently       Knowledge and understanding of Target Heart Rate Range (THRR) Yes  Intervention Provide education and explanation of THRR including how the numbers were predicted and where they are located for reference       Expected Outcomes Long Term: Able to use THRR to govern intensity when exercising independently;Short Term: Able to state/look up THRR;Short Term: Able to use daily as guideline for intensity in rehab       Understanding of Exercise Prescription Yes       Intervention Provide education, explanation, and written materials on patient's individual exercise prescription       Expected Outcomes Short Term: Able to explain program exercise prescription;Long Term: Able to explain home exercise prescription to exercise independently                Exercise Goals Re-Evaluation :  Exercise Goals Re-Evaluation     Row Name 10/13/23 1559 11/07/23 0724 12/05/23 0919         Exercise Goal Re-Evaluation   Exercise Goals Review Increase Physical Activity;Able to understand and use Dyspnea scale;Understanding of Exercise Prescription;Increase Strength and Stamina;Knowledge and understanding of Target Heart Rate Range (THRR);Able to understand and use rate of perceived exertion (RPE) scale Increase Physical Activity;Able to understand and use Dyspnea scale;Understanding of Exercise Prescription;Increase Strength and Stamina;Knowledge and understanding of Target Heart Rate Range (THRR);Able to understand and use rate of perceived exertion (RPE) scale Increase Physical Activity;Able to understand and use Dyspnea scale;Understanding of Exercise Prescription;Increase Strength and Stamina;Knowledge and understanding of Target Heart Rate Range (THRR);Able to understand and use rate of perceived exertion (RPE) scale     Comments Vincent Meyers has completed 2 exercise sessions. He exercises for 15 min on the track and recumbent elliptical. Vincent Meyers averages 2.08  METs on the track and 1.9 METs at level 1 on the recumbent elliptical. He performs the warmup and cooldown standing wihtout limitations. It is too soon to notate any discernable progressions. Will continue to monitor and progress as able. Vincent Meyers has completed 5 exercise sessions. He exercises for 15 min on the track and recumbent elliptical. Vincent Meyers averages 2.23 METs on the track and 2.2 METs at level 3 on the recumbent elliptical. He performs the warmup and cooldown standing wihtout limitations. Vincent Meyers has slightly increased his laps on the track and level on the recumbent elliptical. He tolerates progressions well. We have recently discussed home exercise as Vincent Meyers has stopped exercise at [facility] for rehab. I encouraged him to start exercising at [facility] again. Will continue to monitor and progress as able. Vincent Meyers has completed 13 exercise sessions. He exercises for 15 min on the track and recumbent elliptical. Vincent Meyers averages 2.38 METs on the track and 3.2 METs at level 5 on the recumbent elliptical. He performs the warmup and cooldown standing wihtout limitations. Vincent Meyers has increased his workload for the recumbent elliptical several times as METs have significantly increased. His track laps have somehwat increased. Vincent Meyers has not started exercising at [facility] yet as he is waiting until he finished rehab. 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 Will continue to monitor and progress as able.     Expected Outcomes Through exercise at rehab and home, the patient will decrease shortness of breath with daily activities and feel confident in carrying out an exercise regimen at home. Through exercise at rehab and home, the patient will decrease shortness of breath with daily activities and feel confident in carrying out an exercise regimen at home. Through exercise at rehab and home, the patient will decrease shortness of breath with daily activities  and feel confident in carrying out an exercise regimen at home.               Discharge Exercise Prescription (Final Exercise Prescription Changes):  Exercise Prescription Changes - 12/13/23 1200       Response to Exercise   Blood Pressure (Admit) 110/62    Blood Pressure (Exercise) 121/58    Blood Pressure (Exit) 108/62    Heart Rate (Admit) 72 bpm    Heart Rate (Exercise) 85 bpm    Heart Rate (Exit) 70 bpm    Oxygen Saturation (Admit) 97 %    Oxygen Saturation (Exercise) 91 %    Oxygen Saturation (Exit) 97 %    Rating of Perceived Exertion (Exercise) 13    Perceived Dyspnea (Exercise) 1    Duration Continue with 30 min of aerobic exercise without signs/symptoms of physical distress.    Intensity THRR unchanged      Progression   Progression Continue to progress workloads to maintain intensity without signs/symptoms of physical distress.      Resistance Training   Training Prescription Yes    Weight blue bands    Reps 10-15    Time 10 Minutes      Oxygen   Oxygen Continuous    Liters 2-3      Recumbant Elliptical   Level 5    Watts 81    Minutes 15    METs 3.2      Track   Laps 9    Minutes 15    METs 2.38             Nutrition:  Target Goals: Understanding of nutrition guidelines, daily intake of sodium 1500mg , cholesterol 200mg , calories 30% from fat and 7% or less from saturated fats, daily to have 5 or more servings of fruits and vegetables.  Biometrics:  Pre Biometrics - 10/07/23 1319       Pre Biometrics   Grip Strength 30 kg              Nutrition Therapy Plan and Nutrition Goals:  Nutrition Therapy & Goals - 10/11/23 1130       Nutrition Therapy   Diet Heart Healthy/ Carbohydrate Consistent diet    Drug/Food Interactions Statins/Certain Fruits      Personal Nutrition Goals   Nutrition Goal Patient to improve diet quality by using the plate method as a guide for meal planning to include lean protein/plant protein, fruits, vegetables, whole grains, nonfat dairy as part of a well-balanced  diet.    Personal Goal #2 Patient to continue to follow a low sodium diet 2300mg  per day    Comments Vincent Meyers has medical history of COPD, PAD, Hyperlipidemia, pulmonary fibrosis, DM2, HTN, CAD, OSA. He does not check his blood sugar regularly. Lipids remain well controlled. He has good knowledge of  low sodium diet, reading food labels for sodium, benefits of small/frequent meals. He enjoys a wide variety of foods including lean protein, fruits, and vegetables. Patient will benefit from participation in pulmonary rehab for nutrition, exercise, and lifestyle modification.      Intervention Plan   Intervention Prescribe, educate and counsel regarding individualized specific dietary modifications aiming towards targeted core components such as weight, hypertension, lipid management, diabetes, heart failure and other comorbidities.;Nutrition handout(s) given to patient.    Expected Outcomes Short Term Goal: Understand basic principles of dietary content, such as calories, fat, sodium, cholesterol and nutrients.;Long Term Goal: Adherence to prescribed nutrition plan.  Nutrition Assessments:  Nutrition Assessments - 10/11/23 1145       Rate Your Plate Scores   Pre Score 75            MEDIFICTS Score Key: >=70 Need to make dietary changes  40-70 Heart Healthy Diet <= 40 Therapeutic Level Cholesterol Diet  Flowsheet Row PULMONARY REHAB OTHER RESPIRATORY from 10/11/2023 in [facility] for Heart, Vascular, & Lung Health  Picture Your Plate Total Score on Admission 75      Picture Your Plate Scores: <16 Unhealthy dietary pattern with much room for improvement. 41-50 Dietary pattern unlikely to meet recommendations for good health and room for improvement. 51-60 More healthful dietary pattern, with some room for improvement.  >60 Healthy dietary pattern, although there may be some specific behaviors that could be improved.    Nutrition Goals  Re-Evaluation:  Nutrition Goals Re-Evaluation     Row Name 10/11/23 1130             Goals   Current Weight 217 lb 6 oz (98.6 kg)       Comment A1c 7.0, lipids from 04/08/2022- HDL 31, LDL 56       Expected Outcome Vincent Meyers has medical history of COPD, PAD, Hyperlipidemia, pulmonary fibrosis, DM2, HTN, CAD, OSA. He does not check his blood sugar regularly. Lipids remain well controlled. He has good knowledge of low sodium diet, reading food labels for sodium, benefits of small/frequent meals. He enjoys a wide variety of foods including lean protein, fruits, and vegetables. Patient will benefit from participation in pulmonary rehab for nutrition, exercise, and lifestyle modification.                Nutrition Goals Discharge (Final Nutrition Goals Re-Evaluation):  Nutrition Goals Re-Evaluation - 10/11/23 1130       Goals   Current Weight 217 lb 6 oz (98.6 kg)    Comment A1c 7.0, lipids from 04/08/2022- HDL 31, LDL 56    Expected Outcome Vincent Meyers has medical history of COPD, PAD, Hyperlipidemia, pulmonary fibrosis, DM2, HTN, CAD, OSA. He does not check his blood sugar regularly. Lipids remain well controlled. He has good knowledge of low sodium diet, reading food labels for sodium, benefits of small/frequent meals. He enjoys a wide variety of foods including lean protein, fruits, and vegetables. Patient will benefit from participation in pulmonary rehab for nutrition, exercise, and lifestyle modification.             Psychosocial: Target Goals: Acknowledge presence or absence of significant depression and/or stress, maximize coping skills, provide positive support system. Participant is able to verbalize types and ability to use techniques and skills needed for reducing stress and depression.  Initial Review & Psychosocial Screening:  Initial Psych Review & Screening - 10/07/23 1346       Initial Review   Current issues with Current Depression;Current Stress Concerns    Source of Stress  Concerns Chronic Illness;Unable to participate in former interests or hobbies    Comments Pts initial PHQ9 was 2, but he states he sometimes feels depressed when he thinks about his future.      Family Dynamics   Good Support System? Yes    Comments Pts significant other and family provides support      Barriers   Psychosocial barriers to participate in program The patient should benefit from training in stress management and relaxation.      Screening Interventions   Interventions Encouraged to exercise;To provide support and resources with  identified psychosocial needs    Expected Outcomes Short Term goal: Utilizing psychosocial counselor, staff and physician to assist with identification of specific Stressors or current issues interfering with healing process. Setting desired goal for each stressor or current issue identified.;Long Term Goal: Stressors or current issues are controlled or eliminated.;Short Term goal: Identification and review with participant of any Quality of Life or Depression concerns found by scoring the questionnaire.;Long Term goal: The participant improves quality of Life and PHQ9 Scores as seen by post scores and/or verbalization of changes             Quality of Life Scores:  Scores of 19 and below usually indicate a poorer quality of life in these areas.  A difference of  2-3 points is a clinically meaningful difference.  A difference of 2-3 points in the total score of the Quality of Life Index has been associated with significant improvement in overall quality of life, self-image, physical symptoms, and general health in studies assessing change in quality of life.  PHQ-9: Review Flowsheet  More data exists      10/31/2023 10/07/2023 07/29/2023 04/26/2023 01/04/2023  Depression screen PHQ 2/9  Decreased Interest 0 0 0 2 0  Down, Depressed, Hopeless 0 0 1 1 0  PHQ - 2 Score 0 0 1 3 0  Altered sleeping 0 1 3 3  -  Tired, decreased energy 0 0 3 3 -  Change in  appetite 0 0 0 2 -  Feeling bad or failure about yourself  0 1 0 0 -  Trouble concentrating 0 0 0 0 -  Moving slowly or fidgety/restless 0 0 0 0 -  Suicidal thoughts 0 0 0 0 -  PHQ-9 Score 0 2 7 11  -  Difficult doing work/chores Not difficult at all Somewhat difficult - Somewhat difficult -   Interpretation of Total Score  Total Score Depression Severity:  1-4 = Minimal depression, 5-9 = Mild depression, 10-14 = Moderate depression, 15-19 = Moderately severe depression, 20-27 = Severe depression   Psychosocial Evaluation and Intervention:  Psychosocial Evaluation - 10/07/23 1352       Psychosocial Evaluation & Interventions   Interventions Stress management education;Relaxation education;Encouraged to exercise with the program and follow exercise prescription    Comments Vincent Meyers states he feels depressed sometimes when he thinks about his future. 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 He denies needing psychotropic meds and a referral to a therapist at this time.    Expected Outcomes For Vincent Meyers to participate in PR free of any psychosocial barriers or concerns.    Continue Psychosocial Services  Follow up required by staff             Psychosocial Re-Evaluation:  Psychosocial Re-Evaluation     Row Name 10/14/23 1342 11/07/23 1112 12/09/23 0956         Psychosocial Re-Evaluation   Current issues with Current Depression;Current Stress Concerns Current Stress Concerns;Current Depression Current Stress Concerns;Current Depression     Comments Vincent Meyers has only attended two sessions so far. He denies any new psychosocial barriers or concerns at this time. Vincent Meyers feels his depression is stable at this time. He states he is not depressed every day. Sometimes he has moments of sadness and hoplessness when he thinks about his future. He denies any new psychosocial barriers or concerns at this time. Vincent Meyers is doing well in PR. He states his mental health is stable at this  time. 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 He likes coming to class and feels that exercise is also helping with his mental health. He denies any new psychosocial barriers or concerns at this time.     Expected Outcomes For Vincent Meyers to participate in PR with no depression or stress. For Vincent Meyers to participate in PR with no depression or stress. For Vincent Meyers to participate in PR with no depression or stress.     Interventions Encouraged to attend Pulmonary Rehabilitation for the exercise Encouraged to attend Pulmonary Rehabilitation for the exercise Encouraged to attend Pulmonary Rehabilitation for the exercise     Continue Psychosocial Services  Follow up required by staff Follow up required by staff No Follow up required              Psychosocial Discharge (Final Psychosocial Re-Evaluation):  Psychosocial Re-Evaluation - 12/09/23 0956       Psychosocial Re-Evaluation   Current issues with Current Stress Concerns;Current Depression    Comments Vincent Meyers is doing well in PR. He states his mental health is stable at this time. 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 He likes coming to class and feels that exercise is also helping with his mental health. He denies any new psychosocial barriers or concerns at this time.    Expected Outcomes For Vincent Meyers to participate in PR with no depression or stress.    Interventions Encouraged to attend Pulmonary Rehabilitation for the exercise    Continue Psychosocial Services  No Follow up required             Education: Education Goals: Education classes will be provided on a weekly basis, covering required topics. Participant will state understanding/return demonstration of topics presented.  Learning Barriers/Preferences:  Learning Barriers/Preferences - 10/07/23 1352       Learning Barriers/Preferences   Learning Barriers Sight;Hearing     Learning Preferences Written Material;Group Instruction;Individual Instruction             Education Topics: Know Your Numbers Group instruction that is supported by a PowerPoint presentation. Instructor discusses importance of knowing and understanding resting, exercise, and post-exercise oxygen saturation, heart rate, and blood pressure. Oxygen saturation, heart rate, blood pressure, rating of perceived exertion, and dyspnea are reviewed along with a normal range for these values.  Flowsheet Row PULMONARY REHAB OTHER RESPIRATORY from 11/24/2023 in [facility] for Heart, Vascular, & Lung Health  Date 11/24/23  Educator EP  Instruction Review Code 1- Verbalizes Understanding       Exercise for the Pulmonary Patient Group instruction that is supported by a PowerPoint presentation. Instructor discusses benefits of exercise, core components of exercise, frequency, duration, and intensity of an exercise routine, importance of utilizing pulse oximetry during exercise, safety while exercising, and options of places to exercise outside of rehab.  Flowsheet Row PULMONARY REHAB OTHER RESPIRATORY from 11/17/2023 in [facility] for Heart, Vascular, & Lung Health  Date 11/17/23  Educator EP  Instruction Review Code 1- Verbalizes Understanding       MET Level  Group instruction provided by PowerPoint, verbal discussion, and written material to support subject matter. Instructor reviews what METs are and how to increase METs.    Pulmonary Medications Verbally interactive group education provided by instructor with focus on inhaled medications and proper administration. Flowsheet Row PULMONARY REHAB OTHER RESPIRATORY from 11/10/2023 in [place]  [facility] for Heart, Vascular, & Lung Health  Date 11/10/23  Educator RT  Instruction Review Code 1- Verbalizes Understanding       Anatomy and Physiology of the Respiratory  System Group instruction provided by PowerPoint, verbal discussion, and written material to support subject matter. Instructor reviews respiratory cycle and anatomical components of the respiratory system and their functions. Instructor also reviews differences in obstructive and restrictive respiratory diseases with examples of each.  Flowsheet Row PULMONARY REHAB OTHER RESPIRATORY from 11/03/2023 in [facility] for Heart, Vascular, & Lung Health  Date 11/03/23  Educator RT  Instruction Review Code 1- Verbalizes Understanding       Oxygen Safety Group instruction provided by PowerPoint, verbal discussion, and written material to support subject matter. There is an overview of "What is Oxygen" and "Why do we need it".  Instructor also reviews how to create a safe environment for oxygen use, the importance of using oxygen as prescribed, and the risks of noncompliance. There is a brief discussion on traveling with oxygen and resources the patient may utilize. Flowsheet Row PULMONARY REHAB OTHER RESPIRATORY from 12/01/2023 in [facility] for Heart, Vascular, & Lung Health  Date 12/01/23  Educator RN  Instruction Review Code 1- Verbalizes Understanding       Oxygen Use Group instruction provided by PowerPoint, verbal discussion, and written material to discuss how supplemental oxygen is prescribed and different types of oxygen supply systems. Resources for more information are provided.  Flowsheet Row PULMONARY REHAB OTHER RESPIRATORY from 12/08/2023 in [facility] for Heart, Vascular, & Lung Health  Date 12/08/23  Educator RT  Instruction Review Code 1- Verbalizes Understanding       Breathing Techniques Group instruction that is supported by demonstration and informational handouts. Instructor discusses the benefits of pursed lip and diaphragmatic breathing and detailed demonstration on how to perform both.     Risk  Factor Reduction Group instruction that is supported by a PowerPoint presentation. Instructor discusses the definition of a risk factor, different risk factors for pulmonary disease, and how the heart and lungs work together.   Pulmonary Diseases Group instruction provided by PowerPoint, verbal discussion, and written material to support subject matter. Instructor gives an overview of the different type of pulmonary diseases. There is also a discussion on risk factors and symptoms as well as ways to manage the diseases. Flowsheet Row PULMONARY REHAB OTHER RESPIRATORY from 10/27/2023 in [facility] for Heart, Vascular, & Lung Health  Date 10/27/23  Educator RT  Instruction Review Code 1- Verbalizes Understanding       Stress and Energy Conservation Group instruction provided by PowerPoint, verbal discussion, and written material to support subject matter. Instructor gives an overview of stress and the impact it can have on the body. Instructor also reviews ways to reduce stress. There is also a discussion on energy conservation and ways to conserve energy throughout the day.   Warning Signs and Symptoms Group instruction provided by PowerPoint, verbal discussion, and written material to support subject matter. Instructor reviews warning signs and symptoms of stroke, heart attack, cold and flu. Instructor also reviews ways to prevent the spread of infection.   Other Education Group or individual verbal, written, or video instructions that support the educational goals of the pulmonary rehab program.    Knowledge Questionnaire Score:  Knowledge Questionnaire Score - 10/07/23 1431       Knowledge Questionnaire Score  Pre Score 15/18             Core Components/Risk Factors/Patient Goals at Admission:  Personal Goals and Risk Factors at Admission - 10/07/23 1354       Core Components/Risk Factors/Patient Goals on Admission    Weight Management Yes;Weight  Loss    Intervention Weight Management: Develop a combined nutrition and exercise program designed to reach desired caloric intake, while maintaining appropriate intake of nutrient and fiber, sodium and fats, and appropriate energy expenditure required for the weight goal.;Weight Management: Provide education and appropriate resources to help participant work on and attain dietary goals.;Weight Management/Obesity: Establish reasonable short term and long term weight goals.;Obesity: Provide education and appropriate resources to help participant work on and attain dietary goals.    Expected Outcomes Short Term: Continue to assess and modify interventions until short term weight is achieved;Long Term: Adherence to nutrition and physical activity/exercise program aimed toward attainment of established weight goal;Weight Loss: Understanding of general recommendations for a balanced deficit meal plan, which promotes 1-2 lb weight loss per week and includes a negative energy balance of [id number] kcal/d;Understanding recommendations for meals to include 15-35% energy as protein, 25-35% energy from fat, 35-60% energy from carbohydrates, less than 200mg  of dietary cholesterol, 20-35 gm of total fiber daily;Understanding of distribution of calorie intake throughout the day with the consumption of 4-5 meals/snacks    Improve shortness of breath with ADL's Yes    Intervention Provide education, individualized exercise plan and daily activity instruction to help decrease symptoms of SOB with activities of daily living.    Expected Outcomes Short Term: Improve cardiorespiratory fitness to achieve a reduction of symptoms when performing ADLs;Long Term: Be able to perform more ADLs without symptoms or delay the onset of symptoms             Core Components/Risk Factors/Patient Goals Review:   Goals and Risk Factor Review     Row Name 10/14/23 1345 11/07/23 1114 12/09/23 1004         Core Components/Risk  Factors/Patient Goals Review   Personal Goals Review Weight Management/Obesity;Improve shortness of breath with ADL's;Develop more efficient breathing techniques such as purse lipped breathing and diaphragmatic breathing and practicing self-pacing with activity. Weight Management/Obesity;Improve shortness of breath with ADL's;Develop more efficient breathing techniques such as purse lipped breathing and diaphragmatic breathing and practicing self-pacing with activity. Weight Management/Obesity;Improve shortness of breath with ADL's     Review Vincent Meyers has only attended two sessions so far. Goal progressing for weight loss. Goal progressing for improving shortness of breath with ADL's. Vincent Meyers is requiring 2L of O2 to maintain sats >88% while exercising. Goal progressing for developing more efficient breathing techniques such as purse lipped breathing and diaphragmatic breathing; and practicing self-pacing with activity. We will continue to monitor Vincent Meyers's progress throughout the program. Goal progressing for weight loss. Goal progressing for improving shortness of breath with ADL's. Vincent Meyers is requiring 2L of O2 to maintain sats >88% while exercising. Goal met for developing more efficient breathing techniques such as purse lipped breathing and diaphragmatic breathing; and practicing self-pacing with activity. Vincent Meyers is able to demonstrate purse lip breathing when he gets short of breath. He also knows how to pace himself when he is walking the track.  We will continue to monitor Vincent Meyers's progress throughout the program. Goal progressing for weight loss. Goal progressing for improving shortness of breath with ADL's. Vincent Meyers is requiring 2-3L of O2 to maintain sats >88% while exercising. He has been able to increase  his workload and MET's while exercising. We will continue to monitor Vincent Meyers's progress throughout the program.     Expected Outcomes To lose weight, improve shortness of breath with ADL's and develop more efficient breathing  techniques such as purse lipped breathing and diaphragmatic breathing; and practicing self-pacing with activity. To lose weight and improve shortness of breath with ADL's. To lose weight and improve shortness of breath with ADL's.              Core Components/Risk Factors/Patient Goals at Discharge (Final Review):   Goals and Risk Factor Review - 12/09/23 1004       Core Components/Risk Factors/Patient Goals Review   Personal Goals Review Weight Management/Obesity;Improve shortness of breath with ADL's    Review Goal progressing for weight loss. Goal progressing for improving shortness of breath with ADL's. Vincent Meyers is requiring 2-3L of O2 to maintain sats >88% while exercising. He has been able to increase his workload and MET's while exercising. We will continue to monitor Vincent Meyers's progress throughout the program.    Expected Outcomes To lose weight and improve shortness of breath with ADL's.             ITP Comments: Pt is making expected progress toward Pulmonary Rehab goals after completing 16 session(s). Recommend continued exercise, life style modification, education, and utilization of breathing techniques to increase stamina and strength, while also decreasing shortness of breath with exertion.  Dr. Mechele Collin is Medical Director for Pulmonary Rehab at [facility].

## 2023-12-15 ENCOUNTER — Encounter (HOSPITAL_COMMUNITY)
Admission: RE | Admit: 2023-12-15 | Discharge: 2023-12-15 | Disposition: A | Discharge status: Home / Self Care | Payer: Medicare HMO | Source: Ambulatory Visit | Attending: Internal Medicine | Admitting: Internal Medicine

## 2023-12-15 DIAGNOSIS — J84112 Idiopathic pulmonary fibrosis: Secondary | ICD-10-CM | POA: Diagnosis not present

## 2023-12-15 NOTE — Progress Notes (Signed)
Daily Session Note  Patient Details  Name: Vincent Meyers MRN: [id number] Date of Birth: 01/22/1946 Referring Provider:   Doristine Devoid Pulmonary Rehab Walk Test from 10/07/2023 in [facility]  Referring Provider Ramaswamy       Encounter Date: 12/15/2023  Check In:  Session Check In - 12/15/23 1115       Check-In   Supervising physician immediately available to respond to emergencies [facility] MD immediately available    Physician(s) Neila Gear, NP    Location [facility]    Staff Present Essie Hart, RN, BSN;Thatiana Renbarger, Zella Richer, MS, ACSM-CEP, Exercise Physiologist;David Manus Gunning, MS, ACSM-CEP, CCRP, Exercise Physiologist;Bailey Wallace Cullens, MS, Exercise Physiologist    Virtual Visit No    Medication changes reported     No    Fall or balance concerns reported    No    Tobacco Cessation No Change    Warm-up and Cool-down Performed as group-led instruction    Resistance Training Performed Yes    VAD Patient? No    PAD/SET Patient? No      Pain Assessment   Currently in Pain? No/denies    Pain Score 0-No pain    Multiple Pain Sites No             Capillary Blood Glucose: No results found for this or any previous visit (from the past 24 hours).    Social History   Tobacco Use  Smoking Status Former   Current packs/day: 0.00   Average packs/day: 2.0 packs/day for 23.0 years (46.0 ttl pk-yrs)   Types: Cigarettes   Start date: 37   Quit date: 1989   Years since quitting: 36.0   Passive exposure: Past  Smokeless Tobacco Never    Goals Met:  Proper associated with RPD/PD & O2 Sat Independence with exercise equipment Exercise tolerated well No report of concerns or symptoms today Strength training completed today  Goals Unmet:  Not Applicable  Comments: Service time is from 1013 to 1145.    Dr. Mechele Collin is Medical Director for Pulmonary Rehab at [facility]

## 2023-12-19 ENCOUNTER — Telehealth (HOSPITAL_BASED_OUTPATIENT_CLINIC_OR_DEPARTMENT_OTHER): Payer: Self-pay | Admitting: Family Medicine

## 2023-12-19 NOTE — Telephone Encounter (Signed)
-----   Message from [place] sent at 12/15/2023  4:09 PM EST ----- Please reach out to pt to schedule an appt ----- Message ----- From: [name], Raymond J, MD Sent: 10/14/2023   1:26 PM EST To: Park Breed, CMA; Betsy A May  Please contact patient to schedule office visit ----- Message ----- From: Milderd Meager., MD Sent: 10/14/2023  12:57 PM EST To: Raymond J [name], MD  I saw Mr. Fleek today for follow-up of his urinary symptoms.  He is having significant frequency and nocturia.  I asked him to complete a voiding diary at his last visit and we discussed this today.  He reports that he is drinking 6 liters of fluid a day and his urinary output is around 6 liters.  When questioned about his fluid intake, he reported that he is constantly thirsty.  I advised him that this level of fluid intake is abnormal. I checked a BMP today. I recommended that he contact your office for further evaluation for possible causes of his polydipsia and polyuria.  Thanks,  [employer]

## 2023-12-19 NOTE — Telephone Encounter (Signed)
Called pt but pt stated does not need appt

## 2023-12-20 ENCOUNTER — Encounter (HOSPITAL_COMMUNITY)
Admission: RE | Admit: 2023-12-20 | Discharge: 2023-12-20 | Disposition: A | Discharge status: Home / Self Care | Payer: Medicare HMO | Source: Ambulatory Visit | Attending: Internal Medicine | Admitting: Internal Medicine

## 2023-12-20 DIAGNOSIS — J84112 Idiopathic pulmonary fibrosis: Secondary | ICD-10-CM | POA: Diagnosis not present

## 2023-12-20 NOTE — Progress Notes (Signed)
Daily Session Note  Patient Details  Name: Vincent Meyers MRN: [id number] Date of Birth: December 07, 1945 Referring Provider:   Doristine Devoid Pulmonary Rehab Walk Test from 10/07/2023 in [facility]  Referring Provider Ramaswamy       Encounter Date: 12/20/2023  Check In:  Session Check In - 12/20/23 1033       Check-In   Supervising physician immediately available to respond to emergencies [facility] MD immediately available    Physician(s) Tereso Newcomer, PA    [facility]    Staff Present Essie Hart, RN, BSN;Yoland Scherr Katrinka Blazing, Zella Richer, MS, ACSM-CEP, Exercise Physiologist;Olinty Peggye Pitt, MS, ACSM-CEP, Exercise Physiologist    Virtual Visit No    Medication changes reported     No    Fall or balance concerns reported    No    Tobacco Cessation No Change    Warm-up and Cool-down Performed as group-led instruction    Resistance Training Performed Yes    VAD Patient? No    PAD/SET Patient? No      Pain Assessment   Currently in Pain? No/denies    Pain Score 0-No pain    Multiple Pain Sites No             Capillary Blood Glucose: No results found for this or any previous visit (from the past 24 hours).    Social History   Tobacco Use  Smoking Status Former   Current packs/day: 0.00   Average packs/day: 2.0 packs/day for 23.0 years (46.0 ttl pk-yrs)   Types: Cigarettes   Start date: 60   Quit date: 1989   Years since quitting: 36.0   Passive exposure: Past  Smokeless Tobacco Never    Goals Met:  Proper associated with RPD/PD & O2 Sat Independence with exercise equipment Exercise tolerated well No report of concerns or symptoms today Strength training completed today  Goals Unmet:  Not Applicable  Comments: Service time is from 1010 to 1130.    Dr. Mechele Collin is Medical Director for Pulmonary Rehab at [facility].

## 2023-12-22 ENCOUNTER — Encounter (HOSPITAL_COMMUNITY)
Admission: RE | Admit: 2023-12-22 | Discharge: 2023-12-22 | Disposition: A | Discharge status: Home / Self Care | Payer: Medicare HMO | Source: Ambulatory Visit | Attending: Internal Medicine | Admitting: Internal Medicine

## 2023-12-22 DIAGNOSIS — J84112 Idiopathic pulmonary fibrosis: Secondary | ICD-10-CM | POA: Diagnosis not present

## 2023-12-22 NOTE — Progress Notes (Signed)
Daily Session Note  Patient Details  Name: Vincent Meyers MRN: [id number] Date of Birth: 1946-11-19 Referring Provider:   Doristine Devoid Pulmonary Rehab Walk Test from 10/07/2023 in [facility]  Referring Provider Ramaswamy       Encounter Date: 12/22/2023  Check In:  Session Check In - 12/22/23 1030       Check-In   Supervising physician immediately available to respond to emergencies [facility] immediately available    Physician(s) Edd Fabian, NP    [facility]    Staff Present Essie Hart, RN, BSN;Kaeleigh Westendorf Katrinka Blazing, Zella Richer, MS, ACSM-CEP, Exercise Physiologist;Jetta Walker BS, ACSM-CEP, Exercise Physiologist    Virtual Visit No    Medication changes reported     No    Fall or balance concerns reported    No    Tobacco Cessation No Change    Warm-up and Cool-down Performed as group-led instruction    Resistance Training Performed Yes    VAD Patient? No    PAD/SET Patient? No      Pain Assessment   Currently in Pain? No/denies    Multiple Pain Sites No             Capillary Blood Glucose: No results found for this or any previous visit (from the past 24 hours).    Social History   Tobacco Use  Smoking Status Former   Current packs/day: 0.00   Average packs/day: 2.0 packs/day for 23.0 years (46.0 ttl pk-yrs)   Types: Cigarettes   Start date: 76   Quit date: 1989   Years since quitting: 36.1   Passive exposure: Past  Smokeless Tobacco Never    Goals Met:  Proper associated with RPD/PD & O2 Sat Independence with exercise equipment Exercise tolerated well No report of concerns or symptoms today Strength training completed today  Goals Unmet:  Not Applicable  Comments: Service time is from 1021 to 1147.    Dr. Mechele Collin is Medical Director for Pulmonary Rehab at [facility].

## 2023-12-27 ENCOUNTER — Encounter (HOSPITAL_COMMUNITY)
Admission: RE | Admit: 2023-12-27 | Discharge: 2023-12-27 | Disposition: A | Discharge status: Home / Self Care | Payer: Medicare HMO | Source: Ambulatory Visit | Attending: Internal Medicine | Admitting: Internal Medicine

## 2023-12-27 VITALS — Wt 213.2 lb

## 2023-12-27 DIAGNOSIS — J84112 Idiopathic pulmonary fibrosis: Secondary | ICD-10-CM | POA: Insufficient documentation

## 2023-12-27 NOTE — Progress Notes (Signed)
Daily Session Note  Patient Details  Name: Vincent Meyers MRN: [id number] Date of Birth: [birth year] Referring Provider:   Doristine Devoid Pulmonary Rehab Walk Test from 10/07/2023 in [facility], V[facility], & [facility]  Referring Provider Ramaswamy       Encounter Date: 12/27/2023  Check In:  Session Check In - 12/27/23 1025       Check-In   Supervising physician immediately available to respond to emergencies [facility] MD immediately available    Physician(s) Edd Fabian, NP    [facility]    Staff Present Essie Hart, RN, BSN;Casey Katrinka Blazing, Zella Richer, MS, ACSM-CEP, Exercise Physiologist;Abiola Behring Idelle Crouch BS, ACSM-CEP, Exercise Physiologist    Virtual Visit No    Medication changes reported     No    Fall or balance concerns reported    No    Tobacco Cessation No Change    Warm-up and Cool-down Performed as group-led instruction    Resistance Training Performed Yes    VAD Patient? No    PAD/SET Patient? No      Pain Assessment   Currently in Pain? No/denies    Multiple Pain Sites No             Capillary Blood Glucose: No results found for this or any previous visit (from the past 24 hours).   Exercise Prescription Changes - 12/27/23 1200       Response to Exercise   Blood Pressure (Admit) 156/60    Blood Pressure (Exit) 138/64    Heart Rate (Admit) 83 bpm    Heart Rate (Exercise) 94 bpm    Heart Rate (Exit) 74 bpm    Oxygen Saturation (Admit) 96 %    Oxygen Saturation (Exercise) 88 %    Oxygen Saturation (Exit) 96 %    Rating of Perceived Exertion (Exercise) 13    Perceived Dyspnea (Exercise) 2    Duration Continue with 30 min of aerobic exercise without signs/symptoms of physical distress.    Intensity THRR unchanged      Progression   Progression Continue to progress workloads to maintain intensity without signs/symptoms of physical distress.      Resistance Training   Training Prescription Yes     Weight blue bands    Reps 10-15    Time 10 Minutes      Oxygen   Oxygen Continuous    Liters 2-3      Recumbant Elliptical   Level 5    Minutes 15    METs 3.3      Track   Laps 9.5    Minutes 15    METs 2.45             Social History   Tobacco Use  Smoking Status Former   Current packs/day: 0.00   Average packs/day: 2.0 packs/day for 23.0 years (46.0 ttl pk-yrs)   Types: Cigarettes   Start date: 41   Quit date: 1989   Years since quitting: 36.1   Passive exposure: Past  Smokeless Tobacco Never    Goals Met:  Independence with exercise equipment Exercise tolerated well No report of concerns or symptoms today Strength training completed today  Goals Unmet:  Not Applicable  Comments: Service time is from 1011 to 1139.    Dr. Mechele Collin is Medical Director for Pulmonary Rehab at [facility].

## 2023-12-28 ENCOUNTER — Other Ambulatory Visit: Payer: Self-pay

## 2023-12-29 ENCOUNTER — Encounter (HOSPITAL_COMMUNITY)
Admission: RE | Admit: 2023-12-29 | Discharge: 2023-12-29 | Disposition: A | Discharge status: Home / Self Care | Payer: Medicare HMO | Source: Ambulatory Visit | Attending: Internal Medicine | Admitting: Internal Medicine

## 2023-12-29 ENCOUNTER — Other Ambulatory Visit: Payer: Self-pay | Admitting: Family Medicine

## 2023-12-29 DIAGNOSIS — J84112 Idiopathic pulmonary fibrosis: Secondary | ICD-10-CM

## 2023-12-29 NOTE — Progress Notes (Signed)
 Daily Session Note  Patient Details  Name: DEONDRICK SEARLS MRN: [id number] Date of Birth: 1946/11/05 Referring Provider:   Conrad Ports Pulmonary Rehab Walk Test from 10/07/2023 in [facility], [facility]  Referring Provider Ramaswamy       Encounter Date: 12/29/2023  Check In:  Session Check In - 12/29/23 1023       Check-In   Supervising physician immediately available to respond to emergencies [facility] MD immediately available    Physician(s) Rosabel Mose, NP    [facility]    Staff Present Ronal Levin, RN, BSN;Casey Claudene, Neita Moats, MS, ACSM-CEP, Exercise Physiologist;Randi Midge BS, ACSM-CEP, Exercise Physiologist    Virtual Visit No    Medication changes reported     No    Fall or balance concerns reported    No    Tobacco Cessation No Change    Warm-up and Cool-down Performed as group-led instruction    Resistance Training Performed Yes    VAD Patient? No    PAD/SET Patient? No      Pain Assessment   Currently in Pain? No/denies    Multiple Pain Sites No             Capillary Blood Glucose: No results found for this or any previous visit (from the past 24 hours).    Social History   Tobacco Use  Smoking Status Former   Current packs/day: 0.00   Average packs/day: 2.0 packs/day for 23.0 years (46.0 ttl pk-yrs)   Types: Cigarettes   Start date: 34   Quit date: 1989   Years since quitting: 36.1   Passive exposure: Past  Smokeless Tobacco Never    Goals Met:  Proper associated with RPD/PD & O2 Sat Exercise tolerated well No report of concerns or symptoms today Strength training completed today  Goals Unmet:  Not Applicable  Comments: Service time is from 1012 to 1130.    Dr. Slater Staff is Medical Director for Pulmonary Rehab at [facility].

## 2023-12-29 NOTE — Progress Notes (Signed)
 Discharge Progress Report  Patient Details  Name: Vincent Meyers MRN: [id number] Date of Birth: 08-16-1946 Referring Provider:   [facility] Walk Test from 10/07/2023 in [facility]  Referring Provider Ramaswamy        Number of Visits: 21  Reason for Discharge:  Patient has met program and personal goals.  Smoking History:  Social History   Tobacco Use  Smoking Status Former   Current packs/day: 0.00   Average packs/day: 2.0 packs/day for 23.0 years (46.0 ttl pk-yrs)   Types: Cigarettes   Start date: 8   Quit date: 1989   Years since quitting: 36.1   Passive exposure: Past  Smokeless Tobacco Never    Diagnosis:  IPF (idiopathic pulmonary fibrosis) (HCC)  ADL [facility]:  Pulmonary Assessment Scores     Row Name 10/07/23 1429 12/22/23 1523 12/29/23 1534     ADL [facility]   ADL Phase Entry Exit --   SOB Score total 44 59 --     CAT Score   CAT Score 23 18 --     mMRC Score   mMRC Score 2 -- 2            Initial Exercise Prescription:  Initial Exercise Prescription - 10/07/23 1500       Date of Initial Exercise RX and Referring Provider   Date 10/07/23    Referring Provider Ramaswamy    Expected Discharge Date 12/29/23      Oxygen   Oxygen Continuous    Liters 2    Maintain Oxygen Saturation 88% or higher      Recumbant Elliptical   Level 2    RPM 40    Watts 20    Minutes 15    METs 2      Track   Minutes 15    METs 2      Prescription Details   Frequency (times per week) 2    Duration Progress to 30 minutes of continuous aerobic without signs/symptoms of physical distress      Intensity   THRR 40-80% of Max Heartrate 58-115    Ratings of Perceived Exertion 11-13    Perceived Dyspnea 0-4      Progression   Progression Continue to progress workloads to maintain intensity without signs/symptoms of physical distress.      Resistance Training   Training  Prescription Yes    Weight blue bands    Reps 10-15             Discharge Exercise Prescription (Final Exercise Prescription Changes):  Exercise Prescription Changes - 12/27/23 1200       Response to Exercise   Blood Pressure (Admit) 156/60    Blood Pressure (Exit) 138/64    Heart Rate (Admit) 83 bpm    Heart Rate (Exercise) 94 bpm    Heart Rate (Exit) 74 bpm    Oxygen Saturation (Admit) 96 %    Oxygen Saturation (Exercise) 88 %    Oxygen Saturation (Exit) 96 %    Rating of Perceived Exertion (Exercise) 13    Perceived Dyspnea (Exercise) 2    Duration Continue with 30 min of aerobic exercise without signs/symptoms of physical distress.    Intensity THRR unchanged      Progression   Progression Continue to progress workloads to maintain intensity without signs/symptoms of physical distress.      Resistance Training   Training Prescription Yes    Weight  blue bands    Reps 10-15    Time 10 Minutes      Oxygen   Oxygen Continuous    Liters 2-3      Recumbant Elliptical   Level 5    Minutes 15    METs 3.3      Track   Laps 9.5    Minutes 15    METs 2.45             Functional Capacity:  6 Minute Walk     Row Name 10/07/23 1457 12/29/23 1528       6 Minute Walk   Phase Initial Discharge    Distance 905 feet 1340 feet    Distance % Change -- 48.07 %    Distance Feet Change -- 435 ft    Walk Time 6 minutes 6 minutes    # of Rest Breaks 0 0    MPH 1.71 2.54    METS 1.98 2.77    RPE 11 15    Perceived Dyspnea  2 2    VO2 Peak 6.94 9.7    Symptoms No No    Resting HR 73 bpm 70 bpm    Resting BP 142/70 138/60    Resting Oxygen Saturation  96 % 98 %    Exercise Oxygen Saturation  during 6 min walk 89 % 89 %    Max Ex. HR 123 bpm 132 bpm    Max Ex. BP 152/68 142/60    2 Minute Post BP 138/68 128/62      Interval HR   1 Minute HR 80 97    2 Minute HR 109 80    3 Minute HR 104 116    4 Minute HR 123 107    5 Minute HR 105 132    6 Minute HR  102 97    2 Minute Post HR 71 76    Interval Heart Rate? Yes Yes      Interval Oxygen   Interval Oxygen? Yes Yes    Baseline Oxygen Saturation % 96 % 98 %    1 Minute Oxygen Saturation % 94 % 96 %    1 Minute Liters of Oxygen 2 L 3 L    2 Minute Oxygen Saturation % 90 % 90 %    2 Minute Liters of Oxygen 2 L 3 L    3 Minute Oxygen Saturation % 89 % 91 %    3 Minute Liters of Oxygen 2 L 3 L    4 Minute Oxygen Saturation % 89 % 90 %    4 Minute Liters of Oxygen 2 L 3 L    5 Minute Oxygen Saturation % 89 % 89 %    5 Minute Liters of Oxygen 2 L 3 L    6 Minute Oxygen Saturation % 91 % 89 %    6 Minute Liters of Oxygen 2 L 3 L    2 Minute Post Oxygen Saturation % 97 % 96 %    2 Minute Post Liters of Oxygen 2 L 3 L             Psychological, QOL, Others - Outcomes: PHQ 2/9:    12/22/2023    3:21 PM 10/31/2023    2:32 PM 10/07/2023    1:24 PM 07/29/2023   11:12 AM 04/26/2023   10:58 AM  Depression screen PHQ 2/9  Decreased Interest 0 0 0 0 2  Down, Depressed, Hopeless 1 0 0 1  1  PHQ - 2 Score 1 0 0 1 3  Altered sleeping 3 0 1 3 3   Tired, decreased energy 2 0 0 3 3  Change in appetite 0 0 0 0 2  Feeling bad or failure about yourself  1 0 1 0 0  Trouble concentrating 0 0 0 0 0  Moving slowly or fidgety/restless 1 0 0 0 0  Suicidal thoughts 0 0 0 0 0  PHQ-9 Score 8 0 2 7 11   Difficult doing work/chores Somewhat difficult Not difficult at all Somewhat difficult  Somewhat difficult    Quality of Life:   Personal Goals: Goals established at orientation with interventions provided to work toward goal.  Personal Goals and Risk Factors at Admission - 10/07/23 1354       Core Components/Risk Factors/Patient Goals on Admission    Weight Management Yes;Weight Loss    Intervention Weight Management: Develop a combined nutrition and exercise program designed to reach desired caloric intake, while maintaining appropriate intake of nutrient and fiber, sodium and fats, and appropriate  energy expenditure required for the weight goal.;Weight Management: Provide education and appropriate resources to help participant work on and attain dietary goals.;Weight Management/Obesity: Establish reasonable short term and long term weight goals.;Obesity: Provide education and appropriate resources to help participant work on and attain dietary goals.    Expected Outcomes Short Term: Continue to assess and modify interventions until short term weight is achieved;Long Term: Adherence to nutrition and physical activity/exercise program aimed toward attainment of established weight goal;Weight Loss: Understanding of general recommendations for a balanced deficit meal plan, which promotes 1-2 lb weight loss per week and includes a negative energy balance of [phone] kcal/d;Understanding recommendations for meals to include 15-35% energy as protein, 25-35% energy from fat, 35-60% energy from carbohydrates, less than 200mg  of dietary cholesterol, 20-35 gm of total fiber daily;Understanding of distribution of calorie intake throughout the day with the consumption of 4-5 meals/snacks    Improve shortness of breath with ADL's Yes    Intervention Provide education, individualized exercise plan and daily activity instruction to help decrease symptoms of SOB with activities of daily living.    Expected Outcomes Short Term: Improve cardiorespiratory fitness to achieve a reduction of symptoms when performing ADLs;Long Term: Be able to perform more ADLs without symptoms or delay the onset of symptoms              Personal Goals Discharge:  Goals and Risk Factor Review     Row Name 10/14/23 1345 11/07/23 1114 12/09/23 1004 12/29/23 1544       Core Components/Risk Factors/Patient Goals Review   Personal Goals Review Weight Management/Obesity;Improve shortness of breath with ADL's;Develop more efficient breathing techniques such as purse lipped breathing and diaphragmatic breathing and practicing self-pacing  with activity. Weight Management/Obesity;Improve shortness of breath with ADL's;Develop more efficient breathing techniques such as purse lipped breathing and diaphragmatic breathing and practicing self-pacing with activity. Weight Management/Obesity;Improve shortness of breath with ADL's Weight Management/Obesity;Improve shortness of breath with ADL's    Review Sam has only attended two sessions so far. Goal progressing for weight loss. Goal progressing for improving shortness of breath with ADL's. Sam is requiring 2L of O2 to maintain sats >88% while exercising. Goal progressing for developing more efficient breathing techniques such as purse lipped breathing and diaphragmatic breathing; and practicing self-pacing with activity. We will continue to monitor Sam's progress throughout the program. Goal progressing for weight loss. Goal progressing for improving shortness of breath with ADL's.  Sam is requiring 2L of O2 to maintain sats >88% while exercising. Goal met for developing more efficient breathing techniques such as purse lipped breathing and diaphragmatic breathing; and practicing self-pacing with activity. Sam is able to demonstrate purse lip breathing when he gets short of breath. He also knows how to pace himself when he is walking the track.  We will continue to monitor Sam's progress throughout the program. Goal progressing for weight loss. Goal progressing for improving shortness of breath with ADL's. Sam is requiring 2-3L of O2 to maintain sats >88% while exercising. He has been able to increase his workload and MET's while exercising. We will continue to monitor Sam's progress throughout the program. Sam graduated from the PR program on 12/29/23. He did not meet his goal on losing weight. He did not meet his goal on improving shortness of breath with ADLs. His SOB scale increased from 44 to 59. Sam worked hard during the program and we wish him the best.    Expected Outcomes To lose weight, improve  shortness of breath with ADL's and develop more efficient breathing techniques such as purse lipped breathing and diaphragmatic breathing; and practicing self-pacing with activity. To lose weight and improve shortness of breath with ADL's. To lose weight and improve shortness of breath with ADL's. --             Exercise Goals and Review:  Exercise Goals     Row Name 10/07/23 1403             Exercise Goals   Increase Physical Activity Yes       Intervention Provide advice, education, support and counseling about physical activity/exercise needs.;Develop an individualized exercise prescription for aerobic and resistive training based on initial evaluation findings, risk stratification, comorbidities and participant's personal goals.       Expected Outcomes Short Term: Attend rehab on a regular basis to increase amount of physical activity.;Long Term: Add in home exercise to make exercise part of routine and to increase amount of physical activity.;Long Term: Exercising regularly at least 3-5 days a week.       Increase Strength and Stamina Yes       Intervention Provide advice, education, support and counseling about physical activity/exercise needs.;Develop an individualized exercise prescription for aerobic and resistive training based on initial evaluation findings, risk stratification, comorbidities and participant's personal goals.       Expected Outcomes Short Term: Increase workloads from initial exercise prescription for resistance, speed, and METs.;Short Term: Perform resistance training exercises routinely during rehab and add in resistance training at home;Long Term: Improve cardiorespiratory fitness, muscular endurance and strength as measured by increased METs and functional capacity ( )       Able to understand and use rate of perceived exertion (RPE) scale Yes       Intervention Provide education and explanation on how to use RPE scale       Expected Outcomes Short Term:  Able to use RPE daily in rehab to express subjective intensity level;Long Term:  Able to use RPE to guide intensity level when exercising independently       Able to understand and use Dyspnea scale Yes       Intervention Provide education and explanation on how to use Dyspnea scale       Expected Outcomes Short Term: Able to use Dyspnea scale daily in rehab to express subjective sense of shortness of breath during exertion;Long Term: Able to use Dyspnea scale to guide intensity level  when exercising independently       Knowledge and understanding of Target Heart Rate Range (THRR) Yes       Intervention Provide education and explanation of THRR including how the numbers were predicted and where they are located for reference       Expected Outcomes Long Term: Able to use THRR to govern intensity when exercising independently;Short Term: Able to state/look up THRR;Short Term: Able to use daily as guideline for intensity in rehab       Understanding of Exercise Prescription Yes       Intervention Provide education, explanation, and written materials on patient's individual exercise prescription       Expected Outcomes Short Term: Able to explain program exercise prescription;Long Term: Able to explain home exercise prescription to exercise independently                Exercise Goals Re-Evaluation:  Exercise Goals Re-Evaluation     Row Name 10/13/23 1559 11/07/23 0724 12/05/23 0919 12/29/23 1530       Exercise Goal Re-Evaluation   Exercise Goals Review Increase Physical Activity;Able to understand and use Dyspnea scale;Understanding of Exercise Prescription;Increase Strength and Stamina;Knowledge and understanding of Target Heart Rate Range (THRR);Able to understand and use rate of perceived exertion (RPE) scale Increase Physical Activity;Able to understand and use Dyspnea scale;Understanding of Exercise Prescription;Increase Strength and Stamina;Knowledge and understanding of Target Heart Rate  Range (THRR);Able to understand and use rate of perceived exertion (RPE) scale Increase Physical Activity;Able to understand and use Dyspnea scale;Understanding of Exercise Prescription;Increase Strength and Stamina;Knowledge and understanding of Target Heart Rate Range (THRR);Able to understand and use rate of perceived exertion (RPE) scale Increase Physical Activity;Able to understand and use Dyspnea scale;Understanding of Exercise Prescription;Increase Strength and Stamina;Knowledge and understanding of Target Heart Rate Range (THRR);Able to understand and use rate of perceived exertion (RPE) scale    Comments Sam has completed 2 exercise sessions. He exercises for 15 min on the track and recumbent elliptical. Sam averages 2.08 METs on the track and 1.9 METs at level 1 on the recumbent elliptical. He performs the warmup and cooldown standing wihtout limitations. It is too soon to notate any discernable progressions. Will continue to monitor and progress as able. Sam has completed 5 exercise sessions. He exercises for 15 min on the track and recumbent elliptical. Sam averages 2.23 METs on the track and 2.2 METs at level 3 on the recumbent elliptical. He performs the warmup and cooldown standing wihtout limitations. Sam has slightly increased his laps on the track and level on the recumbent elliptical. He tolerates progressions well. We have recently discussed home exercise as Sam has stopped exercise at [facility] for rehab. I encouraged him to start exercising at [facility] again. Will continue to monitor and progress as able. Sam has completed 13 exercise sessions. He exercises for 15 min on the track and recumbent elliptical. Sam averages 2.38 METs on the track and 3.2 METs at level 5 on the recumbent elliptical. He performs the warmup and cooldown standing wihtout limitations. Sam has increased his workload for the recumbent elliptical several times as METs have significantly increased. His track laps have  somehwat increased. Sam has not started exercising at [facility] yet as he is waiting until he finished rehab. 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 Will continue to monitor and progress as able. Sam has completed 21 exercise sessions. His peak METs were 2.38 on the track and 3.4 on the recumbent  elliptical. Sam plans to continue [facility] at [facility]. I am confident in him carrying out an exercise regimen outside of rehab.    Expected Outcomes Through exercise at rehab and home, the patient will decrease shortness of breath with daily activities and feel confident in carrying out an exercise regimen at home. Through exercise at rehab and home, the patient will decrease shortness of breath with daily activities and feel confident in carrying out an exercise regimen at home. Through exercise at rehab and home, the patient will decrease shortness of breath with daily activities and feel confident in carrying out an exercise regimen at home. Through exercise at rehab and home, the patient will decrease shortness of breath with daily activities and feel confident in carrying out an exercise regimen at home.             Nutrition & Weight - Outcomes:  Pre Biometrics - 10/07/23 1319       Pre Biometrics   Grip Strength 30 kg              Nutrition:  Nutrition Therapy & Goals - 10/11/23 1130       Nutrition Therapy   Diet Heart Healthy/ Carbohydrate Consistent diet    Drug/Food Interactions Statins/Certain Fruits      Personal Nutrition Goals   Nutrition Goal Patient to improve diet quality by using the plate method as a guide for meal planning to include lean protein/plant protein, fruits, vegetables, whole grains, nonfat dairy as part of a well-balanced diet.    Personal Goal #2 Patient to continue to follow a low sodium diet 2300mg  per day    Comments Sam has medical history of COPD, PAD, Hyperlipidemia, pulmonary fibrosis, DM2, HTN, CAD,  OSA. He does not check his blood sugar regularly. Lipids remain well controlled. He has good knowledge of  low sodium diet, reading food labels for sodium, benefits of small/frequent meals. He enjoys a wide variety of foods including lean protein, fruits, and vegetables. Patient will benefit from participation in pulmonary rehab for nutrition, exercise, and lifestyle modification.      Intervention Plan   Intervention Prescribe, educate and counsel regarding individualized specific dietary modifications aiming towards targeted core components such as weight, hypertension, lipid management, diabetes, heart failure and other comorbidities.;Nutrition handout(s) given to patient.    Expected Outcomes Short Term Goal: Understand basic principles of dietary content, such as calories, fat, sodium, cholesterol and nutrients.;Long Term Goal: Adherence to prescribed nutrition plan.             Nutrition Discharge:  Nutrition Assessments - 10/11/23 1145       Rate Your Plate Scores   Pre Score 75             Education Questionnaire Score:  Knowledge Questionnaire Score - 12/22/23 1523       Knowledge Questionnaire Score   Post Score 18/18             Goals reviewed with patient; copy given to patient.

## 2023-12-30 ENCOUNTER — Other Ambulatory Visit (HOSPITAL_COMMUNITY): Payer: Self-pay

## 2024-01-02 ENCOUNTER — Other Ambulatory Visit: Payer: Self-pay

## 2024-01-02 ENCOUNTER — Ambulatory Visit (HOSPITAL_BASED_OUTPATIENT_CLINIC_OR_DEPARTMENT_OTHER): Payer: Medicare HMO

## 2024-01-02 ENCOUNTER — Ambulatory Visit (HOSPITAL_BASED_OUTPATIENT_CLINIC_OR_DEPARTMENT_OTHER): Payer: Medicare HMO | Admitting: Family Medicine

## 2024-01-02 ENCOUNTER — Encounter (HOSPITAL_BASED_OUTPATIENT_CLINIC_OR_DEPARTMENT_OTHER): Payer: Self-pay | Admitting: Family Medicine

## 2024-01-02 VITALS — BP 137/83 | HR 71 | Ht 67.0 in | Wt 211.2 lb

## 2024-01-02 DIAGNOSIS — E1159 Type 2 diabetes mellitus with other circulatory complications: Secondary | ICD-10-CM | POA: Diagnosis not present

## 2024-01-02 DIAGNOSIS — M25512 Pain in left shoulder: Secondary | ICD-10-CM

## 2024-01-02 DIAGNOSIS — M19012 Primary osteoarthritis, left shoulder: Secondary | ICD-10-CM | POA: Diagnosis not present

## 2024-01-02 DIAGNOSIS — G8929 Other chronic pain: Secondary | ICD-10-CM

## 2024-01-02 NOTE — Progress Notes (Signed)
    Procedures performed today:    None.  Independent interpretation of notes and tests performed by another provider:   None.  Brief History, Exam, Impression, and Recommendations:    BP 137/83 (BP Location: Left Arm, Patient Position: Sitting, Cuff Size: Normal)   Pulse 71   Ht 5\' 7"  (1.702 m)   Wt 211 lb 3.2 oz (95.8 kg)   SpO2 98%   BMI 33.08 kg/m   Type 2 diabetes mellitus with other circulatory complication, without long-term current use of insulin (HCC) Assessment & Plan: Most recent hemoglobin A1c remains stable, was still appropriate given patient age and A1c target.  Patient has primarily managed blood sugars without any medications in the past, primarily focusing on lifestyle modifications. Has been having recent issues with polydipsia and polyuria as above. This however has not been that new of an issue for patient.  On recent evaluation with his urologist, he did admit to having more than 6 L of input and output recorded.  He has greatly reduced this, however.  Still continues to have some urinary symptoms, currently working with urology regarding this. We will recheck hemoglobin A1c before next appointment, most recent hemoglobin A1c only 2 months ago.  May consider medications such as metformin or possible GLP-1 receptor agonist should A1c show worsening.  Would be amenable to starting metformin to assist with controlling blood sugars.  We did discuss potential risks and side effects related to medication.  Orders: -     Ambulatory referral to Ophthalmology -     Hemoglobin A1c; Future -     Basic metabolic panel; Future  Chronic left shoulder pain Assessment & Plan: Reports that he has been having some left shoulder pain for the better part of the year.  Pain is primarily over lateral aspect of shoulder, does not feel that it is within the shoulder joint itself.  Has noted some pain while completing pulmonary rehab recently.  Reports that he was generally able to  perform all rehab activities, however has noted some pain in shoulder at night when laying on the shoulder.  Has not had prior evaluation or imaging.  No specific injury recalled. Left shoulder: Obvious swelling, bruising or erythema: absent Deformity of the shoulder: absent Active ROM: diminished range with pain Passive ROM: diminished range with pain Strength: normal/normal Empty can: Positive Hawkins: Positive Neer's: Negative Neurovascular exam: intact  Suspect possible subacromial impingement, possible degree of adhesive capsulitis given that there is some decreased range of motion on active and passive ROM in office today. Will proceed with x-rays today and plan for referral to physical therapy if x-ray is normal.  Plan follow-up in about 2 to 3 months to monitor progress  Orders: -     DG Shoulder Left; Future  Return in about 2 months (around 03/01/2024) for diabetes, shoulder pain.   ___________________________________________ Arnelle Nale [name], MD, ABFM, [facility]

## 2024-01-02 NOTE — Assessment & Plan Note (Signed)
 Most recent hemoglobin A1c remains stable, was still appropriate given patient age and A1c target.  Patient has primarily managed blood sugars without any medications in the past, primarily focusing on lifestyle modifications. Has been having recent issues with polydipsia and polyuria as above. This however has not been that new of an issue for patient.  On recent evaluation with his urologist, he did admit to having more than 6 L of input and output recorded.  He has greatly reduced this, however.  Still continues to have some urinary symptoms, currently working with urology regarding this. We will recheck hemoglobin A1c before next appointment, most recent hemoglobin A1c only 2 months ago.  May consider medications such as metformin or possible GLP-1 receptor agonist should A1c show worsening.  Would be amenable to starting metformin to assist with controlling blood sugars.  We did discuss potential risks and side effects related to medication.

## 2024-01-02 NOTE — Assessment & Plan Note (Signed)
 Reports that he has been having some left shoulder pain for the better part of the year.  Pain is primarily over lateral aspect of shoulder, does not feel that it is within the shoulder joint itself.  Has noted some pain while completing pulmonary rehab recently.  Reports that he was generally able to perform all rehab activities, however has noted some pain in shoulder at night when laying on the shoulder.  Has not had prior evaluation or imaging.  No specific injury recalled. Left shoulder: Obvious swelling, bruising or erythema: absent Deformity of the shoulder: absent Active ROM: diminished range with pain Passive ROM: diminished range with pain Strength: normal/normal Empty can: Positive Hawkins: Positive Neer's: Negative Neurovascular exam: intact  Suspect possible subacromial impingement, possible degree of adhesive capsulitis given that there is some decreased range of motion on active and passive ROM in office today. Will proceed with x-rays today and plan for referral to physical therapy if x-ray is normal.  Plan follow-up in about 2 to 3 months to monitor progress

## 2024-01-02 NOTE — Patient Instructions (Signed)
  Medication Instructions:  Your physician recommends that you continue on your current medications as directed. Please refer to the Current Medication list given to you today. --If you need a refill on any your medications before your next appointment, please call your pharmacy first. If no refills are authorized on file call the office.-- Lab Work: Your physician has recommended that you have lab work today: before next visit  If you have labs (blood work) drawn today and your tests are completely normal, you will receive your results via MyChart message OR a phone call from our staff.  Please ensure you check your voicemail in the event that you authorized detailed messages to be left on a delegated number. If you have any lab test that is abnormal or we need to change your treatment, we will call you to review the results.  Referrals/Procedures/Imaging: Xrays today   Follow-Up: Your next appointment:   Your physician recommends that you schedule a follow-up appointment in: 2-3 months follow up with Dr. [name]  You will receive a text message or e-mail with a link to a survey about your care and experience with us  today! We would greatly appreciate your feedback!   Thanks for letting us  be apart of your health journey!!  Primary Care and Sports Medicine   Dr. Court Distance Peru   We encourage you to activate your patient portal called "MyChart".  Sign up information is provided on this After Visit Summary.  MyChart is used to connect with patients for Virtual Visits (Telemedicine).  Patients are able to view lab/test results, encounter notes, upcoming appointments, etc.  Non-urgent messages can be sent to your provider as well. To learn more about what you can do with MyChart, please visit --  [email or web address].

## 2024-01-02 NOTE — Progress Notes (Signed)
 Specialty Pharmacy Refill Coordination Note  Vincent Meyers is a 78 y.o. male contacted today regarding refills of specialty medication(s) Pirfenidone    Patient requested Delivery   Delivery date: 01/12/24   Verified address: [address]   Medication will be filled on 02.19.25.

## 2024-01-03 ENCOUNTER — Encounter: Payer: Self-pay | Admitting: Urology

## 2024-01-03 NOTE — Telephone Encounter (Signed)
Voiding diary results:  Day 1:   Intake  3330 ml Output 2100 ml Night time output  750/2100 (35%)  8 voids in daytime, 3 voids over night  Day 2: Intake 2390 ml Output 2510 ml Night time output   650/2510 (26%) 10 voids in daytime, 3 voids over night  Day 3: Intake 2500 ml Output 3150 ml Nightime output     1000/3150 (32%) 12 voids in daytime, 4 over night   Voided volume:  50-325 ml

## 2024-01-04 ENCOUNTER — Encounter: Payer: Medicare HMO | Admitting: Internal Medicine

## 2024-01-04 DIAGNOSIS — M25512 Pain in left shoulder: Secondary | ICD-10-CM

## 2024-01-04 DIAGNOSIS — G8929 Other chronic pain: Secondary | ICD-10-CM

## 2024-01-04 DIAGNOSIS — J84112 Idiopathic pulmonary fibrosis: Secondary | ICD-10-CM

## 2024-01-04 DIAGNOSIS — Z006 Encounter for examination for normal comparison and control in clinical research program: Secondary | ICD-10-CM

## 2024-01-04 NOTE — Progress Notes (Signed)
TITLE: A Phase 2, Randomized, Double-Blind, Placebo-Controlled Study to Evaluate the Safety and Efficacy of [id number] in Patients With Idiopathic Pulmonary Fibrosis  Protocol #: [id number] NCT: Sponsor:[employer]:  This is a randomized, double-blinded, placebo-controlled multicenter study to evaluate the safety and efficacy of [id number] in patients with IPF with or without standard-of-care. 2:1 randomization ratio to [id number] 150 mg BID or the matching placebo for  24 weeks.    Mechanism of Action Proline is one of the largest constituents of collagen. In IPF, there is excessive deposition of collagen. Prolyl-tRNA Synthetase (PRS) is an enzyme that conjugates proline. [id number] (Bersiporocin) is the world's first selective PRS inhibitor that decreases collagen formation and subsequent pro-fibrotic markers.  Administration  A dose of [id number] 150 mg BID will be administered orally in the fasted state or at least 2 hours after the last meal, for 24 weeks.   Key Inclusion Criteria age = 40 years Documented diagnosis of IPF per the 2018 ATS/ERS/JRS/ALAT Clinical Practice Meeting all of the following criteria during the screening period:             FVC >= 40% predicted            DLCOcor >=25% to <= 80%              (FEV1)/FVC ratio >= 0.7             Able to walk at least 150 m in , resting SpO2 should be >= 88% with a maximum of 6L O2/min  On a stable dose of pirfenidone OR nintedanib for at least 3 months OR on neither pirfenidone nor nintedanib.  Key Exclusion Criteria Currently taking medication known as a strong CYP2D6 inhibitor OR taking medication known to be CYP2D6 inducers OR CYP2D6 substrate with narrow therapeutic index. GFR  < 30 mL/min/1.42m2 moderate to severe hepatic impairment (Child-Pugh B and C). Patients with =3upper limit of normal of alanine aminotransferase, aspartate aminotransferase or gamma-glutamyl transpeptidase. Abnormal  ECG findings including but not limited to QTc >500 ms.  Pharmacokinetics Urine PK data indicate that renal elimination is not the major clearance pathway for [id number].   Adverse effects and risk Overall, when [id number] was administered concomitantly with Pirfenidone and Nintedanib, [id number] 150 mg enteric-coated tablet was generally well tolerated and safe.   Safety data from edition number [id number], abstracted in March 2023.   Gastrointestinal adverse reactions (e.g Diarrhea, abdominal pain, nausea, vomiting) followed by CNS (headache and dizziness) as below were the most commonly observed adverse reactions.  Most adverse reactions were mild or moderate and reversible.  An improved enteric-coated 150 mg tablet (new) was reformulated to lower the initial dissolution rate in pH 6.8. Enteric-coated tablets (new) demonstrated that nausea and vomiting appear to have decreased. While the incidence rate of diarrhea was slightly increased than old formulation administration, the severity was all mild.  Below table comprises predominantly of enteric coated tablet.  Overall adverse event % n=229 Average of 4 different studies [id number] n=24(part 1) Esbriet combination [id number] n=24(part 2) Ofev combination   Nausea 54 out of 229 23.58% 6(25%) 2(8.33%)  Vomiting 37 out of 205 18.04% 1(4.17%) 1(4.17%)  Diarrhea 37 out of 229 16.15% 2(8.33%) 7(29.17%)  Abdominal pain 7 out of 229 3.05% 0 1(4.17%)  Constipation 4 out of 54  7.4% - -  Abdominal discomfort 4 out of 205  1.95% 0 1(4.17%)  Headache 25 out of 205  12.19% 4(16.67%) 1(4.17%)  Dizziness 8 out of 175 (4.57%)  3(12.50%) 1(4.17%)   Severity of TEAE Table  Overall adverse event % n=229 Average of 4 different studies [id number] n=24(part 1) Esbriet combination [id number] n=24(part 2) Ofev combination   TEAE - occurrences  382 19 18  Severity- mild 351 (91.88%) 18 (94.73%) 17 (94.44%)   Moderate 31 (8.11%) 1 (5.26%) 1  (5.55%)  Severe 0 (0%) 0 (0%)  0 (0%)     91% TEAEs were mild  (96 of 106 TEAEs, including all 16 TEAEs in the placebo group). 9% (n=10) were moderate.   TEAEs occurred in a dose dependent manner with 7 of the 10 occurring in daily doses >= 600mg   All moderate TEAEs were classed as Gastrointestinal Disorders (nausea, vomiting, diarrhea, and epigastric discomfort), were considered recovered/resolved within 24 hours, and had no sequelae. There were no severe, life-threatening or fatal TEAEs across the study. There were no subjects with serious TEAEs, and there were no subjects with TEAEs leading to IP discontinuation during Part 1 (SAD) of the study.  EKG concerns There were no effects on ECG with dose up to 80 mg/kg in cynomolgus monkeys. In humans no EKG abnormalities reported  except in Part 2 ([id number] administered concomitantly with Nintedanib), one case of PR prolongation LFT concerns In rats, reversible, minimal DWN12088HCl-related centrilobular hepatocyte hypertrophy was noted in the liver administered >=50 mg/kg/day and was considered consistent with DWN12088HCl-related induction of hepatocellular enzymes.  In humans, on investigation in the MAD study of six subjects, hepatic enzyme was increased in one participant (16.7%).   Rare concerns based on animal data - not seen in humans Excessive salivation in rats  at doses >50 mg/kg/day and mildly diminished appetite in 1 monkey was observed on one occasion.  With extremely high doses of 1200 mg/kg decreased activity, increased/labored/shallow respiration, gasping, vocalizing and piloerection was observed in male (but not male) rats 6h post dose.  Xxxxxx  This visit for Subject Vincent Meyers with DOB: 08-21-1946 on 01/04/2024 for the above protocol is Visit/Encounter # followup  and is for purpose of reserch . Subject/LAR expressed continued interest and consent in continuing as a study subject. Subject thanked for participation in  research and contribution to science.    S: At this visit he had a coronary to tell me that he continues to be compliant with the study drug and also standard of care drug.  He has ongoing waxing and waning diarrhea and constipation.  This is no change since starting the study.  Therefore no adverse event.  He is completed pulmonary rehabilitation and he actually feels better.  External record review shows he saw primary care doctor 01/02/2024 and reported shoulder pain on the left side.  He tells me that it has been going on for 2 years in total and is slowly worse in the last 1 year.  Definitely not worse since starting the study drug.  He had a shoulder x-ray results are pending.    Physical exam done and documented in the source of but overall no change   A/P Assessment    ICD-10-CM   1. Research study patient  Z00.6     2. 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 Chronic left shoulder pain  M25.512    G89.29        Patient Instructions  Research study patient IPF (idiopathic pulmonary fibrosis) (HCC)  -Clinically stable.  Tolerating study drug and standard of care drug well  Plan - Continue with rest of research visit including  spirometry and labs  Chronic left shoulder pain x 2 years but worse in the last 1 year   -Noted after you entered the recent study but you have actually had this going on for from even before you entered the recent study  Plan - Research coordinator to document this in the medical history - We will track shoulder x-ray reports and decide if there is an adverse event are not [if so this will be unrelated to study drug   Follow-up per study protocol     SIGNATURE    Dr. Kalman Shan, M.D., F.C.C.P, ACRP-CPI Pulmonary and Critical Care Medicine Research Investigator, [facility] Health Staff Physician, [facility] Center Director - Interstitial Lung Disease  Program  Pulmonary Fibrosis [facility] and [facility] @ [facility] [place], [place], [address]   Pager: [phone], If no answer  OR between  19:00-7:00h: page [phone] Telephone (research): [phone]  10:35 AM 01/04/2024   10:35 AM 01/04/2024

## 2024-01-04 NOTE — Research (Signed)
 TITLE: A Phase 2, Randomized, Double-Blind, Placebo-Controlled Study to Evaluate the Safety and Efficacy of [id number] in Patients With Idiopathic Pulmonary Fibrosis  Protocol #: [id number] NCT: Sponsor:[employer]  Protocol Version as of 01/04/2024 is 6.0 and dated  19Apr2024  Consent Version as of 01/04/2024 is v5  and dated 31Aug2023 Investigator Brochure as of 01/04/2024 is 8.0  and dated 20Jul2022  Study Design:  This is a randomized, double-blinded, placebo-controlled multicenter study to evaluate the safety and efficacy of [id number] in patients with IPF with or without standard-of-care. 2:1 randomization ratio to [id number] 150 mg BID or the matching placebo for  24 weeks.    Mechanism of Action Proline is one of the largest constituents of collagen. In IPF, there is excessive deposition of collagen. Prolyl-tRNA Synthetase (PRS) is an enzyme that conjugates proline. [id number] (Bersiporocin) is the world's first selective PRS inhibitor that decreases collagen formation and subsequent pro-fibrotic markers.  Administration  A dose of [id number] 150 mg BID will be administered orally in the fasted state or at least 2 hours after the last meal, for 24 weeks.   Key Inclusion Criteria age = 40 years Documented diagnosis of IPF per the 2018 ATS/ERS/JRS/ALAT Clinical Practice Meeting all of the following criteria during the screening period:             FVC >= 40% predicted            DLCOcor >=25% to <= 80%              (FEV1)/FVC ratio >= 0.7             Able to walk at least 150 m in , resting SpO2 should be >= 88% with a maximum of 6L O2/min  On a stable dose of pirfenidone OR nintedanib for at least 3 months OR on neither pirfenidone nor nintedanib.  Key Exclusion Criteria Currently taking medication known as a strong CYP2D6 inhibitor OR taking medication known to be CYP2D6 inducers OR CYP2D6 substrate with narrow therapeutic index. GFR  < 30 mL/min/1.78m2 moderate  to severe hepatic impairment (Child-Pugh B and C). Patients with =3upper limit of normal of alanine aminotransferase, aspartate aminotransferase or gamma-glutamyl transpeptidase. Abnormal ECG findings including but not limited to QTc >500 ms.  Pharmacokinetics Urine PK data indicate that renal elimination is not the major clearance pathway for OZD66440.   Adverse effects and risk Overall, when [id number] was administered concomitantly with Pirfenidone and Nintedanib, [id number] 150 mg enteric-coated tablet was generally well tolerated and safe.   Safety data from edition number [id number], abstracted in March 2023.   Gastrointestinal adverse reactions (e.g Diarrhea, abdominal pain, nausea, vomiting) followed by CNS (headache and dizziness) as below were the most commonly observed adverse reactions.  Most adverse reactions were mild or moderate and reversible.  An improved enteric-coated 150 mg tablet (new) was reformulated to lower the initial dissolution rate in pH 6.8. Enteric-coated tablets (new) demonstrated that nausea and vomiting appear to have decreased. While the incidence rate of diarrhea was slightly increased than old formulation administration, the severity was all mild.  Below table comprises predominantly of enteric coated tablet.  Overall adverse event % n=229 Average of 4 different studies [id number] n=24(part 1) Esbriet combination [id number] n=24(part 2) Ofev combination   Nausea 54 out of 229 23.58% 6(25%) 2(8.33%)  Vomiting 37 out of 205 18.04% 1(4.17%) 1(4.17%)  Diarrhea 37 out of 229 16.15% 2(8.33%) 7(29.17%)  Abdominal pain 7 out of 229 3.05% 0 1(4.17%)  Constipation 4  out of 54  7.4% - -  Abdominal discomfort 4 out of 205  1.95% 0 1(4.17%)  Headache 25 out of 205  12.19% 4(16.67%) 1(4.17%)  Dizziness 8 out of 175 (4.57%) 3(12.50%) 1(4.17%)   Severity of TEAE Table  Overall adverse event % n=229 Average of 4 different studies [id number] n=24(part  1) Esbriet combination [id number] n=24(part 2) Ofev combination   TEAE - occurrences  382 19 18  Severity- mild 351 (91.88%) 18 (94.73%) 17 (94.44%)   Moderate 31 (8.11%) 1 (5.26%) 1 (5.55%)  Severe 0 (0%) 0 (0%)  0 (0%)     91% TEAEs were mild  (96 of 106 TEAEs, including all 16 TEAEs in the placebo group). 9% (n=10) were moderate.   TEAEs occurred in a dose dependent manner with 7 of the 10 occurring in daily doses >= 600mg   All moderate TEAEs were classed as Gastrointestinal Disorders (nausea, vomiting, diarrhea, and epigastric discomfort), were considered recovered/resolved within 24 hours, and had no sequelae. There were no severe, life-threatening or fatal TEAEs across the study. There were no subjects with serious TEAEs, and there were no subjects with TEAEs leading to IP discontinuation during Part 1 (SAD) of the study.  EKG concerns There were no effects on ECG with dose up to 80 mg/kg in cynomolgus monkeys. In humans no EKG abnormalities reported  except in Part 2 ([id number] administered concomitantly with Nintedanib), one case of PR prolongation LFT concerns In rats, reversible, minimal DWN12088HCl-related centrilobular hepatocyte hypertrophy was noted in the liver administered >=50 mg/kg/day and was considered consistent with DWN12088HCl-related induction of hepatocellular enzymes.  In humans, on investigation in the MAD study of six subjects, hepatic enzyme was increased in one participant (16.7%).   Rare concerns based on animal data - not seen in humans Excessive salivation in rats  at doses >50 mg/kg/day and mildly diminished appetite in 1 monkey was observed on one occasion.  With extremely high doses of 1200 mg/kg decreased activity, increased/labored/shallow respiration, gasping, vocalizing and piloerection was observed in male (but not male) rats 6h post dose.  [facility] [place] Clinical Research Coordinator note:   This visit for Subject Vincent Meyers  with DOB: 02/25/1946 on 01/04/2024 for the above protocol is Visit/Encounter # 3  and is for purpose of research.   The consent for this encounter is under Protocol Version 6.0, Investigator Brochure Version 8.0, Consent Version v5 and  is currently IRB approved.   Subject expressed continued interest and consent in continuing as a study subject. Subject confirmed that there was    no change in contact information (e.g. address, telephone, email). Subject thanked for participation in research and contribution to science. In this visit 01/04/2024 the subject will be evaluated by Principal Investigator named Vincent Shan MD. This research coordinator has verified that the above investigator is  up to date with his/her training logs.   The Subject was informed that the PI continues to have oversight of the subject's visits and course through relevant discussions, reviews, and also specifically of this visit by routing of this note to the PI.  The patient returns for his first visit since starting IP at randomization visit 2.  He reports doing well, no side effects. His GI symptoms of occasional diarrhea or constipation remain at his baseline.  He recently had left shoulder xrays to evaluate 1 year plus left shoulder pain; results pending and will be followed up.  He completed all visit assessments.  Please see subject binder for  further details.   Signed by  Christell Constant MD  Clinical Research Coordinator [facility]  [place], [place] 12:12 PM 01/04/2024

## 2024-01-04 NOTE — Patient Instructions (Signed)
Research study patient IPF (idiopathic pulmonary fibrosis) (HCC)  -Clinically stable.  Tolerating study drug and standard of care drug well  Plan - Continue with rest of research visit including spirometry and labs  Chronic left shoulder pain x 2 years but worse in the last 1 year   -Noted after you entered the recent study but you have actually had this going on for from even before you entered the recent study  Plan - Research coordinator to document this in the medical history - We will track shoulder x-ray reports and decide if there is an adverse event are not [if so this will be unrelated to study drug   Follow-up per study protocol

## 2024-01-09 ENCOUNTER — Encounter (HOSPITAL_BASED_OUTPATIENT_CLINIC_OR_DEPARTMENT_OTHER): Payer: Self-pay | Admitting: Family Medicine

## 2024-01-09 ENCOUNTER — Other Ambulatory Visit: Payer: Self-pay
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
## 2024-01-10 ENCOUNTER — Ambulatory Visit (HOSPITAL_BASED_OUTPATIENT_CLINIC_OR_DEPARTMENT_OTHER): Payer: Medicare HMO | Admitting: *Deleted

## 2024-01-10 ENCOUNTER — Encounter (HOSPITAL_BASED_OUTPATIENT_CLINIC_OR_DEPARTMENT_OTHER): Payer: Self-pay

## 2024-01-10 DIAGNOSIS — Z0000 Encounter for general adult medical examination without abnormal findings: Secondary | ICD-10-CM | POA: Diagnosis not present

## 2024-01-10 NOTE — Progress Notes (Signed)
 Subjective:   Vincent Meyers is a 78 y.o. male who presents for Medicare Annual/Subsequent preventive examination.  Visit Complete: Virtual I connected with  Vincent Meyers on 01/10/24 by a audio enabled telemedicine application and verified that I am speaking with the correct person using two identifiers.  Patient Location: Home  Provider Location: Home Office  I discussed the limitations of evaluation and management by telemedicine. The patient expressed understanding and agreed to proceed.  Vital Signs: Because this visit was a virtual/telehealth visit, some criteria may be missing or patient reported. Any vitals not documented were not able to be obtained and vitals that have been documented are patient reported.  Patient Medicare AWV questionnaire was completed by the patient on 01-02-2024; I have confirmed that all information answered by patient is correct and no changes since this date.  Cardiac Risk Factors include: advanced age (>3men, >54 women);male gender;hypertension     Objective:    Today's Vitals   01/10/24 0839  PainSc: 3    There is no height or weight on file to calculate BMI.     01/10/2024    8:45 AM 10/07/2023    1:44 PM 09/09/2023   11:50 AM 01/04/2023    9:26 AM  Advanced Directives  Does Patient Have a Medical Advance Directive? No No No No  Would patient like information on creating a medical advance directive? No - Patient declined No - Patient declined No - Patient declined No - Patient declined    Current Medications (verified) Outpatient Encounter Medications as of 01/10/2024  Medication Sig   alfuzosin (UROXATRAL) 10 MG 24 hr tablet Take 1 tablet (10 mg total) by mouth daily.   amLODipine (NORVASC) 5 MG tablet TAKE 1 TABLET (5 MG TOTAL) BY MOUTH DAILY.   aspirin EC 81 MG tablet Take 81 mg by mouth daily.   atenolol (TENORMIN) 50 MG tablet TAKE 1 AND 1/2 TABLETS BY MOUTH DAILY   FIBER PO Take 4 capsules by mouth 2 (two) times daily.    Flaxseed, Linseed, (FLAXSEED OIL PO) Take 1 capsule by mouth daily.   fluticasone (FLONASE) 50 MCG/ACT nasal spray Place 2 sprays into both nostrils daily.   hydrochlorothiazide (HYDRODIURIL) 25 MG tablet TAKE 1 TABLET BY MOUTH EVERY DAY   lisinopril (ZESTRIL) 40 MG tablet TAKE 1 TABLET BY MOUTH EVERY DAY   loperamide (IMODIUM A-D) 2 MG tablet Take 2-4 mg by mouth 4 (four) times daily as needed for diarrhea or loose stools.   MAGNESIUM-POTASSIUM PO Take 1 tablet by mouth daily. With zinc   Misc Natural Products (TURMERIC CURCUMIN) CAPS Take 1 capsule by mouth daily.   omeprazole (PRILOSEC) 20 MG capsule TAKE 1 CAPSULE BY MOUTH EVERY DAY   Pirfenidone 801 MG TABS Take 1 tablet (801 mg total) by mouth 3 (three) times daily with meals.   simvastatin (ZOCOR) 40 MG tablet TAKE 1 TABLET(40 MG) BY MOUTH EVERY NIGHT   tadalafil (CIALIS) 20 MG tablet Take 1 tablet (20 mg total) by mouth daily as needed.   No facility-administered encounter medications on file as of 01/10/2024.    Allergies (verified) Patient has no known allergies.   History: Past Medical History:  Diagnosis Date   Acid reflux    Angina pectoris (HCC)    Anxiety    Arthritis    Coronary artery disease    Erectile dysfunction    Hyperlipidemia    Hypertension    IBS (irritable bowel syndrome)    ILD (interstitial lung  disease) (HCC)    OSA (obstructive sleep apnea)    Past Surgical History:  Procedure Laterality Date   APPENDECTOMY     COLON SURGERY     perforated bowel and hernia repair   ILIAC ARTERY ANEURYSM REPAIR Left 2006   Dr. Arbie Cookey (redo surgery)   REPLACEMENT TOTAL KNEE BILATERAL     RIGHT HEART CATH N/A 09/09/2023   Procedure: RIGHT HEART CATH;  Surgeon: Laurey Morale, MD;  Location: [facility] INVASIVE CV LAB;  Service: Cardiovascular;  Laterality: N/A;   TONSILLECTOMY     age 37   Family History  Problem Relation Age of Onset   Parkinson's disease Mother    Heart attack Father    Stroke Father     Alzheimer's disease Father    Alzheimer's disease Sister    Other Sister        degenerative muscle disease   Hypertension Daughter    Obesity Daughter    Heart murmur Daughter    Colon cancer Neg Hx    Esophageal cancer Neg Hx    Rectal cancer Neg Hx    Stomach cancer Neg Hx    Social History   Socioeconomic History   Marital status: Significant Other    Spouse name: Not on file   Number of children: 2   Years of education: Not on file   Highest education level: High school graduate  Occupational History   Occupation: retired  Tobacco Use   Smoking status: Former    Current packs/day: 0.00    Average packs/day: 2.0 packs/day for 23.0 years (46.0 ttl pk-yrs)    Types: Cigarettes    Start date: 1966    Quit date: 1989    Years since quitting: 36.1    Passive exposure: Past   Smokeless tobacco: Never  Vaping Use   Vaping status: Never Used  Substance and Sexual Activity   Alcohol use: Not Currently    Alcohol/week: 2.0 standard drinks of alcohol    Types: 2 Standard drinks or equivalent per week    Comment: quit 7 months ago stated 12/23/22   Drug use: Never   Sexual activity: Yes  Other Topics Concern   Not on file  Social History Narrative   Not on file   Social Drivers of Health   Financial Resource Strain: Low Risk  (01/10/2024)   Overall Financial Resource Strain (CARDIA)    Difficulty of Paying Living Expenses: Not hard at all  Food Insecurity: No Food Insecurity (01/10/2024)   Hunger Vital Sign    Worried About Running Out of Food in the Last Year: Never true    Ran Out of Food in the Last Year: Never true  Transportation Needs: No Transportation Needs (01/10/2024)   PRAPARE - Administrator, Civil Service (Medical): No    Lack of Transportation (Non-Medical): No  Physical Activity: Inactive (01/10/2024)   Exercise Vital Sign    Days of Exercise per Week: 0 days    Minutes of Exercise per Session: 0 min  Stress: No Stress Concern Present  (01/10/2024)   [employer] of Occupational Health - Occupational Stress Questionnaire    Feeling of Stress : Not at all  Social Connections: Socially Integrated (01/10/2024)   Social Connection and Isolation Panel [NHANES]    Frequency of Communication with Friends and Family: More than three times a week    Frequency of Social Gatherings with Friends and Family: More than three times a week    Attends  Religious Services: More than 4 times per year    Active Member of Clubs or Organizations: Yes    Attends Banker Meetings: More than 4 times per year    Marital Status: Married    Tobacco Counseling Counseling given: Not Answered   Clinical Intake:  Pre-visit preparation completed: Yes  Pain : No/denies pain Pain Score: 3  (only when he moves)     Diabetes: No  How often do you need to have someone help you when you read instructions, pamphlets, or other written materials from your doctor or pharmacy?: 1 - Never  Interpreter Needed?: No  Information entered by :: Remi Haggard LPN   Activities of Daily Living    01/10/2024    8:46 AM  In your present state of health, do you have any difficulty performing the following activities:  Hearing? 1  Difficulty concentrating or making decisions? 0  Walking or climbing stairs? 1  Dressing or bathing? 0  Doing errands, shopping? 0  Preparing Food and eating ? N  Using the Toilet? N  In the past six months, have you accidently leaked urine? N  Do you have problems with loss of bowel control? Y  Managing your Medications? N  Managing your Finances? N  Housekeeping or managing your Housekeeping? N    Patient Care Team: [name], [name], MD as PCP - General (Family Medicine) Croitoru, Rachelle Hora, MD as PCP - Cardiology (Cardiology)  Indicate any recent Medical Services you may have received from other than [facility] providers in the past year (date may be approximate).     Assessment:   This is a routine wellness  examination for [place].  Hearing/Vision screen Hearing Screening - Comments:: Some trouble hearing   ENT has helped  long term issues Does not hearing aids Vision Screening - Comments:: Not up to date [facility]   Goals Addressed               This Visit's Progress     Increase physical activity (pt-stated)   On track     Stay Healthy      Patient Stated        Work hard to keep lungs as healthy as possible      Stabilization of disease   On track     Patient is on track. Patient will maintain adherence        Depression Screen    01/10/2024    8:50 AM 01/02/2024    2:32 PM 12/22/2023    3:21 PM 10/31/2023    2:32 PM 10/07/2023    1:24 PM 07/29/2023   11:12 AM 04/26/2023   10:58 AM  PHQ 2/9 Scores  PHQ - 2 Score 0 0 1 0 0 1 3  PHQ- 9 Score 0 0 8 0 2 7 11   Exception Documentation       Medical reason    Fall Risk    01/10/2024    8:42 AM 01/02/2024    2:33 PM 12/29/2023   10:23 AM 12/27/2023   10:26 AM 12/22/2023   10:30 AM  Fall Risk   Falls in the past year? 0 0 0 0 0  Number falls in past yr: 0 0 0 0 0  Injury with Fall? 0 0 0 0 0  Risk for fall due to :  No Fall Risks Impaired balance/gait;Impaired vision;Other (Comment) Impaired balance/gait;Impaired vision;Other (Comment) Impaired balance/gait;Impaired vision;Other (Comment)  Risk for fall due to: Comment   high falls  risk high falls risk high falls risk  Follow up Falls evaluation completed;Education provided;Falls prevention discussed Falls evaluation completed Falls evaluation completed;Falls prevention discussed Falls evaluation completed;Falls prevention discussed Falls evaluation completed;Falls prevention discussed    MEDICARE RISK AT HOME: Medicare Risk at Home Any stairs in or around the home?: Yes If so, are there any without handrails?: No Home free of loose throw rugs in walkways, pet beds, electrical cords, etc?: Yes Adequate lighting in your home to reduce risk of falls?: No Life alert?: No Use  of a cane, walker or w/c?: No Grab bars in the bathroom?: Yes Shower chair or bench in shower?: Yes Elevated toilet seat or a handicapped toilet?: No  TIMED UP AND GO:  Was the test performed?  No    Cognitive Function:        01/10/2024    8:47 AM 01/04/2023    9:26 AM  6CIT Screen  What Year? 0 points 0 points  What month? 0 points 0 points  What time? 0 points 0 points  Count back from 20 0 points 0 points  Months in reverse 0 points 0 points  Repeat phrase 0 points 0 points  Total Score 0 points 0 points    Immunizations Immunization History  Administered Date(s) Administered   Fluad Quad(high Dose 65+) 08/23/2022   Fluad Trivalent(High Dose 65+) 07/29/2023   Influenza Split 09/19/2015   Influenza, High Dose Seasonal PF 10/24/2019   Influenza, Quadrivalent, Recombinant, Inj, Pf 08/22/2017   Influenza,inj,Quad PF,6+ Mos 10/11/2016, 10/24/2018   Influenza,inj,Quad PF,6-35 Mos 10/11/2016, 10/24/2018   Influenza,inj,quad, With Preservative 09/19/2015   Influenza,trivalent, recombinat, inj, PF 08/22/2017   PFIZER(Purple Top)SARS-COV-2 Vaccination 01/06/2020, 01/29/2020   Pneumococcal Conjugate-13 05/19/2016   Pneumococcal Polysaccharide-23 09/05/2012   Tdap 03/12/2015   Unspecified SARS-COV-2 Vaccination 08/16/2022   Zoster, Live 11/05/2013    TDAP status: Up to date  Flu Vaccine status: Up to date  Pneumococcal vaccine status: Up to date  Covid-19 vaccine status: Information provided on how to obtain vaccines.   Qualifies for Shingles Vaccine? Yes   Zostavax completed No   Shingrix Completed?: No.    Education has been provided regarding the importance of this vaccine. Patient has been advised to call insurance company to determine out of pocket expense if they have not yet received this vaccine. Advised may also receive vaccine at local pharmacy or Health Dept. Verbalized acceptance and understanding.  Screening Tests Health Maintenance  Topic Date Due    Zoster Vaccines- Shingrix (1 of 2) 07/24/1997   COVID-19 Vaccine (4 - 2024-25 season) 07/24/2023   OPHTHALMOLOGY EXAM  08/10/2023   Colonoscopy  01/21/2024   Diabetic kidney evaluation - Urine ACR  04/25/2024   FOOT EXAM  04/25/2024   HEMOGLOBIN A1C  04/30/2024   Diabetic kidney evaluation - eGFR measurement  10/30/2024   Medicare Annual Wellness (AWV)  01/09/2025   DTaP/Tdap/Td (2 - Td or Tdap) 03/11/2025   Pneumonia Vaccine 82+ Years old  Completed   INFLUENZA VACCINE  Completed   Hepatitis C Screening  Completed   HPV VACCINES  Aged Out    Health Maintenance  Health Maintenance Due  Topic Date Due   Zoster Vaccines- Shingrix (1 of 2) 07/24/1997   COVID-19 Vaccine (4 - 2024-25 season) 07/24/2023   OPHTHALMOLOGY EXAM  08/10/2023   Colonoscopy  01/21/2024    Colorectal cancer screening: Type of screening: Colonoscopy. Completed 2024. Repeat every 1 years  Lung Cancer Screening: (Low Dose CT Chest recommended if Age  50-80 years, 20 pack-year currently smoking OR have quit w/in 15years.) does not qualify.   Lung Cancer Screening Referral:   Additional Screening:  Hepatitis C Screening: does not qualify; Completed 2023  Vision Screening: Recommended annual ophthalmology exams for early detection of glaucoma and other disorders of the eye. Is the patient up to date with their annual eye exam?  No  Who is the provider or what is the name of the office in which the patient attends annual eye exams? [facility] If pt is not established with a provider, would they like to be referred to a provider to establish care? No .   Dental Screening: Recommended annual dental exams for proper oral hygiene   Community Resource Referral / Chronic Care Management: CRR required this visit?  No   CCM required this visit?  No     Plan:     I have personally reviewed and noted the following in the patient's chart:   Medical and social history Use of alcohol, tobacco or illicit drugs   Current medications and supplements including opioid prescriptions. Patient is not currently taking opioid prescriptions. Functional ability and status Nutritional status Physical activity Advanced directives List of other physicians Hospitalizations, surgeries, and ER visits in previous 12 months Vitals Screenings to include cognitive, depression, and falls Referrals and appointments  In addition, I have reviewed and discussed with patient certain preventive protocols, quality metrics, and best practice recommendations. A written personalized care plan for preventive services as well as general preventive health recommendations were provided to patient.     Remi Haggard, LPN   1/61/0960   After Visit Summary: (MyChart) Due to this being a telephonic visit, the after visit summary with patients personalized plan was offered to patient via MyChart   Nurse Notes:

## 2024-01-10 NOTE — Patient Instructions (Signed)
 Vincent Meyers , Thank you for taking time to come for your Medicare Wellness Visit. I appreciate your ongoing commitment to your health goals. Please review the following plan we discussed and let me know if I can assist you in the future.   Screening recommendations/referrals: Colonoscopy: up to date Recommended yearly ophthalmology/optometry visit for glaucoma screening and checkup Recommended yearly dental visit for hygiene and checkup  Vaccinations: Influenza vaccine: up to date Pneumococcal vaccine: up to date Tdap vaccine: up to date Shingles vaccine: Education provided    Advanced directives: Education provided   Preventive Care 65 Years and Older, Male Preventive care refers to lifestyle choices and visits with your health care provider that can promote health and wellness. What does preventive care include? A yearly physical exam. This is also called an annual well check. Dental exams once or twice a year. Routine eye exams. Ask your health care provider how often you should have your eyes checked. Personal lifestyle choices, including: Daily care of your teeth and gums. Regular physical activity. Eating a healthy diet. Avoiding tobacco and drug use. Limiting alcohol use. Practicing safe sex. Taking low doses of aspirin every day. Taking vitamin and mineral supplements as recommended by your health care provider. What happens during an annual well check? The services and screenings done by your health care provider during your annual well check will depend on your age, overall health, lifestyle risk factors, and family history of disease. Counseling  Your health care provider may ask you questions about your: Alcohol use. Tobacco use. Drug use. Emotional well-being. Home and relationship well-being. Sexual activity. Eating habits. History of falls. Memory and ability to understand (cognition). Work and work Astronomer. Screening  You may have the following tests  or measurements: Height, weight, and BMI. Blood pressure. Lipid and cholesterol levels. These may be checked every 5 years, or more frequently if you are over 70 years old. Skin check. Lung cancer screening. You may have this screening every year starting at age 24 if you have a 30-pack-year history of smoking and currently smoke or have quit within the past 15 years. Fecal occult blood test (FOBT) of the stool. You may have this test every year starting at age 38. Flexible sigmoidoscopy or colonoscopy. You may have a sigmoidoscopy every 5 years or a colonoscopy every 10 years starting at age 29. Prostate cancer screening. Recommendations will vary depending on your family history and other risks. Hepatitis C blood test. Hepatitis B blood test. Sexually transmitted disease (STD) testing. Diabetes screening. This is done by checking your blood sugar (glucose) after you have not eaten for a while (fasting). You may have this done every 1-3 years. Abdominal aortic aneurysm (AAA) screening. You may need this if you are a current or former smoker. Osteoporosis. You may be screened starting at age 51 if you are at high risk. Talk with your health care provider about your test results, treatment options, and if necessary, the need for more tests. Vaccines  Your health care provider may recommend certain vaccines, such as: Influenza vaccine. This is recommended every year. Tetanus, diphtheria, and acellular pertussis (Tdap, Td) vaccine. You may need a Td booster every 10 years. Zoster vaccine. You may need this after age 5. Pneumococcal 13-valent conjugate (PCV13) vaccine. One dose is recommended after age 67. Pneumococcal polysaccharide (PPSV23) vaccine. One dose is recommended after age 3. Talk to your health care provider about which screenings and vaccines you need and how often you need them. This information  is not intended to replace advice given to you by your health care provider. Make  sure you discuss any questions you have with your health care provider. Document Released: 12/05/2015 Document Revised: 07/28/2016 Document Reviewed: 09/09/2015 Elsevier Interactive Patient Education  2017 [employer].  Fall Prevention in the Home Falls can cause injuries. They can happen to people of all ages. There are many things you can do to make your home safe and to help prevent falls. What can I do on the outside of my home? Regularly fix the edges of walkways and driveways and fix any cracks. Remove anything that might make you trip as you walk through a door, such as a raised step or threshold. Trim any bushes or trees on the path to your home. Use bright outdoor lighting. Clear any walking paths of anything that might make someone trip, such as rocks or tools. Regularly check to see if handrails are loose or broken. Make sure that both sides of any steps have handrails. Any raised decks and porches should have guardrails on the edges. Have any leaves, snow, or ice cleared regularly. Use sand or salt on walking paths during winter. Clean up any spills in your garage right away. This includes oil or grease spills. What can I do in the bathroom? Use night lights. Install grab bars by the toilet and in the tub and shower. Do not use towel bars as grab bars. Use non-skid mats or decals in the tub or shower. If you need to sit down in the shower, use a plastic, non-slip stool. Keep the floor dry. Clean up any water that spills on the floor as soon as it happens. Remove soap buildup in the tub or shower regularly. Attach bath mats securely with double-sided non-slip rug tape. Do not have throw rugs and other things on the floor that can make you trip. What can I do in the bedroom? Use night lights. Make sure that you have a light by your bed that is easy to reach. Do not use any sheets or blankets that are too big for your bed. They should not hang down onto the floor. Have a firm  chair that has side arms. You can use this for support while you get dressed. Do not have throw rugs and other things on the floor that can make you trip. What can I do in the kitchen? Clean up any spills right away. Avoid walking on wet floors. Keep items that you use a lot in easy-to-reach places. If you need to reach something above you, use a strong step stool that has a grab bar. Keep electrical cords out of the way. Do not use floor polish or wax that makes floors slippery. If you must use wax, use non-skid floor wax. Do not have throw rugs and other things on the floor that can make you trip. What can I do with my stairs? Do not leave any items on the stairs. Make sure that there are handrails on both sides of the stairs and use them. Fix handrails that are broken or loose. Make sure that handrails are as long as the stairways. Check any carpeting to make sure that it is firmly attached to the stairs. Fix any carpet that is loose or worn. Avoid having throw rugs at the top or bottom of the stairs. If you do have throw rugs, attach them to the floor with carpet tape. Make sure that you have a light switch at the top of  the stairs and the bottom of the stairs. If you do not have them, ask someone to add them for you. What else can I do to help prevent falls? Wear shoes that: Do not have high heels. Have rubber bottoms. Are comfortable and fit you well. Are closed at the toe. Do not wear sandals. If you use a stepladder: Make sure that it is fully opened. Do not climb a closed stepladder. Make sure that both sides of the stepladder are locked into place. Ask someone to hold it for you, if possible. Clearly mark and make sure that you can see: Any grab bars or handrails. First and last steps. Where the edge of each step is. Use tools that help you move around (mobility aids) if they are needed. These include: Canes. Walkers. Scooters. Crutches. Turn on the lights when you go  into a dark area. Replace any light bulbs as soon as they burn out. Set up your furniture so you have a clear path. Avoid moving your furniture around. If any of your floors are uneven, fix them. If there are any pets around you, be aware of where they are. Review your medicines with your doctor. Some medicines can make you feel dizzy. This can increase your chance of falling. Ask your doctor what other things that you can do to help prevent falls. This information is not intended to replace advice given to you by your health care provider. Make sure you discuss any questions you have with your health care provider. Document Released: 09/04/2009 Document Revised: 04/15/2016 Document Reviewed: 12/13/2014 Elsevier Interactive Patient Education  2017 [employer].

## 2024-01-11 ENCOUNTER — Other Ambulatory Visit (HOSPITAL_BASED_OUTPATIENT_CLINIC_OR_DEPARTMENT_OTHER): Payer: Self-pay | Admitting: *Deleted

## 2024-01-11 DIAGNOSIS — G8929 Other chronic pain: Secondary | ICD-10-CM

## 2024-01-17 ENCOUNTER — Ambulatory Visit: Payer: Medicare HMO | Admitting: Cardiovascular Disease

## 2024-01-18 ENCOUNTER — Encounter: Payer: Self-pay | Admitting: Cardiovascular Disease

## 2024-01-18 ENCOUNTER — Ambulatory Visit: Payer: Medicare HMO | Attending: Cardiovascular Disease | Admitting: Cardiovascular Disease

## 2024-01-18 VITALS — BP 140/62 | HR 70 | Ht 67.5 in | Wt 212.4 lb

## 2024-01-18 DIAGNOSIS — E119 Type 2 diabetes mellitus without complications: Secondary | ICD-10-CM | POA: Diagnosis not present

## 2024-01-18 DIAGNOSIS — I739 Peripheral vascular disease, unspecified: Secondary | ICD-10-CM

## 2024-01-18 DIAGNOSIS — I25118 Atherosclerotic heart disease of native coronary artery with other forms of angina pectoris: Secondary | ICD-10-CM

## 2024-01-18 DIAGNOSIS — I10 Essential (primary) hypertension: Secondary | ICD-10-CM | POA: Diagnosis not present

## 2024-01-18 DIAGNOSIS — E785 Hyperlipidemia, unspecified: Secondary | ICD-10-CM | POA: Diagnosis not present

## 2024-01-18 DIAGNOSIS — E782 Mixed hyperlipidemia: Secondary | ICD-10-CM

## 2024-01-18 LAB — LIPID PANEL
Chol/HDL Ratio: 3.8 ratio (ref 0.0–5.0)
Cholesterol, Total: 123 mg/dL (ref 100–199)
HDL: 32 mg/dL — ABNORMAL LOW (ref >39)
LDL Chol Calc (NIH): 70 mg/dL (ref 0–99)
Triglycerides: 116 mg/dL (ref 0–149)
VLDL Cholesterol Cal: 21 mg/dL (ref 5–40)

## 2024-01-18 MED ORDER — ROSUVASTATIN CALCIUM 10 MG PO TABS: 10.0000 mg | ORAL_TABLET | Freq: Every day | ORAL | 3 refills | Status: DC

## 2024-01-18 NOTE — Patient Instructions (Signed)
 Medication Instructions:  - START taking rosuvastatin 10mg  (1 tablet) daily - STOP taking simvastatin *If you need a refill on your cardiac medications before your next appointment, please call your pharmacy*   Lab Work: - Lipid panel today  If you have labs (blood work) drawn today and your tests are completely normal, you will receive your results only by: MyChart Message (if you have MyChart) OR A paper copy in the mail If you have any lab test that is abnormal or we need to change your treatment, we will call you to review the results.   Testing/Procedures: - None ordered  Follow-Up: At [facility], you and your health needs are our priority.  As part of our continuing mission to provide you with exceptional heart care, we have created designated Provider Care Teams.  These Care Teams include your primary Cardiologist (physician) and Advanced Practice Providers (APPs -  Physician Assistants and Nurse Practitioners) who all work together to provide you with the care you need, when you need it.  We recommend signing up for the patient portal called "MyChart".  Sign up information is provided on this After Visit Summary.  MyChart is used to connect with patients for Virtual Visits (Telemedicine).  Patients are able to view lab/test results, encounter notes, upcoming appointments, etc.  Non-urgent messages can be sent to your provider as well.   To learn more about what you can do with MyChart, go to [email or web address].    Your next appointment:   1 year(s)  Provider:   Thurmon Fair, MD

## 2024-01-18 NOTE — Progress Notes (Signed)
 Cardiology Consultation Note:    Date:  01/22/2024   ID:  Vincent Meyers, DOB August 02, 1946, MRN [id number]  PCP:  [name], Raymond J, MD  Cardiologist:  Thurmon Fair, MD  Electrophysiologist:  None   Referring MD: [name], Raymond J, MD   Chief Complaint  Patient presents with   Coronary Artery Disease    History of Present Illness:    Vincent Meyers is a 78 y.o. male with a hx of CAD with exertional angina, PAD, DM, HLP, HTN, pulmonary fibrosis with chronic respiratory failure on home O2.  He is generally doing okay.  He uses his oxygen at 3 L/min during the day and increases it to 4 L/min with activity.  He has completed the pulmonary rehab program but he still goes to the [employer] 3 times a week.  He has leg cramps at night although he drinks a lot of water, approximately 3 L a day.  He denies angina at rest or with activity or claudication.  He has lost weight.  He does have symptoms of prostatism but little else in the way of complaints.  No focal neurological complaints, palpitations, dizziness or syncope.  Coronary CTA August 22, 2020 showed widespread atherosclerosis (Calcium score in the 96 percentile).  He was noted to have a 50-60% mid-distal RCA, greater than 70% PDA, 24-49% left main, 50-69% proximal-mid LAD and D1 with severe greater than 75% stenosis.  FFR showed significant stenosis in PDA and ostial D1.  On the current medical regimen he does not have any angina pectoris even when exercising at the gym.  He is on a moderate dose of amlodipine 5 mg daily and on a beta-blocker 75 mg daily.  He is not on long-acting nitrates since he still has taken tadalafil occasionally.  Lipid-lowering therapy with simvastatin is effective, with an LDL cholesterol 70, but he still has a low HDL at 32.  Glycemic control is good with a recent hemoglobin A1c of 7.1%.  He has normal renal function.  He is taking both amlodipine and simvastatin with possible drug interaction that could  increase the side effects of simvastatin.  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  He has a longstanding history of problems with PAD, although the history is not very clear.  He says he initially had some type of abdominal surgery that required removal of a segment of intestine and that an abnormality with 1 of his pelvic arteries was detected at that time.  He subsequently underwent a procedure by Dr. Arbie Cookey (stent?).  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  And had a repeat procedure, possibly a surgical bypass.  I cannot find any records other than a CT from 2006 that shows a thrombosed left external iliac artery.  "In comparison with [facility] pelvic CT 02/26/05 there is no interval change in aneurysmal dilatation of the left common iliac artery measuring 6.2cm AP x 5.2cm wide (image 65) with likely anterolateral extensive thrombosis and enhancing central patency posteriorly.  The left external iliac artery shows thrombosis.  The left common femoral artery is patent constituted by collateral vessels." [03/04/2005]  Past Medical History:  Diagnosis Date   Acid reflux    Angina pectoris (HCC)    Anxiety    Arthritis    Coronary artery disease    Erectile dysfunction  Hyperlipidemia    Hypertension    IBS (irritable bowel syndrome)    ILD (interstitial lung disease) (HCC)    OSA (obstructive sleep apnea)     Past Surgical History:  Procedure Laterality Date   APPENDECTOMY     COLON SURGERY     perforated bowel and hernia repair   ILIAC ARTERY ANEURYSM REPAIR Left 2006   Dr. Arbie Cookey (redo surgery)   REPLACEMENT TOTAL KNEE BILATERAL     RIGHT HEART CATH N/A 09/09/2023   Procedure: RIGHT HEART CATH;  Surgeon: Laurey Morale, MD;  Location: [facility] INVASIVE CV LAB;  Service: Cardiovascular;  Laterality: N/A;    TONSILLECTOMY     age 45    Current Medications: Current Meds  Medication Sig   amLODipine (NORVASC) 5 MG tablet TAKE 1 TABLET (5 MG TOTAL) BY MOUTH DAILY.   aspirin EC 81 MG tablet Take 81 mg by mouth daily.   atenolol (TENORMIN) 50 MG tablet TAKE 1 AND 1/2 TABLETS BY MOUTH DAILY   FIBER PO Take 4 capsules by mouth 2 (two) times daily.   Flaxseed, Linseed, (FLAXSEED OIL PO) Take 1 capsule by mouth daily.   fluticasone (FLONASE) 50 MCG/ACT nasal spray Place 2 sprays into both nostrils daily.   hydrochlorothiazide (HYDRODIURIL) 25 MG tablet TAKE 1 TABLET BY MOUTH EVERY DAY   lisinopril (ZESTRIL) 40 MG tablet TAKE 1 TABLET BY MOUTH EVERY DAY   MAGNESIUM-POTASSIUM PO Take 1 tablet by mouth daily. With zinc   Misc Natural Products (TURMERIC CURCUMIN) CAPS Take 1 capsule by mouth daily.   omeprazole (PRILOSEC) 20 MG capsule TAKE 1 CAPSULE BY MOUTH EVERY DAY   Pirfenidone 801 MG TABS Take 1 tablet (801 mg total) by mouth 3 (three) times daily with meals.   rosuvastatin (CRESTOR) 10 MG tablet Take 1 tablet (10 mg total) by mouth daily.   [DISCONTINUED] simvastatin (ZOCOR) 40 MG tablet TAKE 1 TABLET(40 MG) BY MOUTH EVERY NIGHT     Allergies:   Patient has no known allergies.   Social History   Socioeconomic History   Marital status: Significant Other    Spouse name: Not on file   Number of children: 2   Years of education: Not on file   Highest education level: High school graduate  Occupational History   Occupation: retired  Tobacco Use   Smoking status: Former    Current packs/day: 0.00    Average packs/day: 2.0 packs/day for 23.0 years (46.0 ttl pk-yrs)    Types: Cigarettes    Start date: 1966    Quit date: 1989    Years since quitting: 36.1    Passive exposure: Past   Smokeless tobacco: Never  Vaping Use   Vaping status: Never Used  Substance and Sexual Activity   Alcohol use: Not Currently    Alcohol/week: 2.0 standard drinks of alcohol    Types: 2 Standard drinks or  equivalent per week    Comment: quit 7 months ago stated 12/23/22   Drug use: Never   Sexual activity: Yes  Other Topics Concern   Not on file  Social History Narrative   Not on file   Social Drivers of Health   Financial Resource Strain: Low Risk  (01/10/2024)   Overall Financial Resource Strain (CARDIA)    Difficulty of Paying Living Expenses: Not hard at all  Food Insecurity: No Food Insecurity (01/10/2024)   Hunger Vital Sign    Worried About Running Out of Food in the Last Year: Never true  Ran Out of Food in the Last Year: Never true  Transportation Needs: No Transportation Needs (01/10/2024)   PRAPARE - Administrator, Civil Service (Medical): No    Lack of Transportation (Non-Medical): No  Physical Activity: Inactive (01/10/2024)   Exercise Vital Sign    Days of Exercise per Week: 0 days    Minutes of Exercise per Session: 0 min  Stress: No Stress Concern Present (01/10/2024)   [employer] of Occupational Health - Occupational Stress Questionnaire    Feeling of Stress : Not at all  Social Connections: Socially Integrated (01/10/2024)   Social Connection and Isolation Panel [NHANES]    Frequency of Communication with Friends and Family: More than three times a week    Frequency of Social Gatherings with Friends and Family: More than three times a week    Attends Religious Services: More than 4 times per year    Active Member of [employer] or Organizations: Yes    Attends Engineer, structural: More than 4 times per year    Marital Status: Married     Family History: The patient's family history includes Alzheimer's disease in his father and sister; Heart attack in his father; Heart murmur in his daughter; Hypertension in his daughter; Obesity in his daughter; Other in his sister; Parkinson's disease in his mother; Stroke in his father. There is no history of Colon cancer, Esophageal cancer, Rectal cancer, or Stomach cancer.  ROS:   Please see the  history of present illness.     All other systems reviewed and are negative.  EKGs/Labs/Other Studies Reviewed:    The following studies were reviewed today: Right heart catheterization 09/10/2023  1. Normal filling pressures.  2. Preserved cardiac output.  3. Borderline elevated PA pressure (MAP 20), PVR not elevated.    Echocardiogram 08/23/2020 [facility]) Normal LV function, G1 DD, and no valvular abnormalities.  Echocardiogram 08/24/2023 in [place]   1. Left ventricular ejection fraction, by estimation, is 65 to 70%. Left  ventricular ejection fraction by 2D MOD biplane is 69.3 %. The left  ventricle has normal function. The left ventricle has no regional wall  motion abnormalities. There is mild left  ventricular hypertrophy. Left ventricular diastolic parameters are  consistent with Grade I diastolic dysfunction (impaired relaxation).   2. Right ventricular systolic function is normal. The right ventricular  size is normal. Tricuspid regurgitation signal is inadequate for assessing  PA pressure.   3. The mitral valve is normal in structure. Trivial mitral valve  regurgitation. No evidence of mitral stenosis.   4. The aortic valve is grossly normal. Aortic valve regurgitation is  trivial. No aortic stenosis is present.   5. The inferior vena cava is normal in size with greater than 50%  respiratory variability, suggesting right atrial pressure of 3 mmHg.   Coronary CTA 07/24/2019 IMPRESSION: 1. Coronary calcium score of 2768. This was 26 percentile for age and sex matched control.   2. Normal coronary origin with right dominance.   3. Suspicion for a severe stenosis in the mid portion of a small PDA and ostial portion of a large 1. diagonal artery. CAD-RADS 4 Severe stenosis. Additional analysis with CT FFR will be submitted.   IMPRESSION: 1. CT FFR showed significant stenoses in the mid portion of a small PDA (2.1 mm) and in the ostial portion of a large 1.  diagonal artery (lumen > 3.5 mm). Consider symptom-guided anti-ischemic pharmacotherapy as well as risk factor modification per guideline directed care.  If this approach fails consider cardiac catheterization, however location of a stenosis in the ostial portion of D1 originating from the proximal LAD might preclude an intervention.   EKG: Personally reviewed the tracing from 09/09/2023 which shows normal sinus rhythm, normal tracing  EKG Interpretation Date/Time:    Ventricular Rate:    PR Interval:    QRS Duration:    QT Interval:    QTC Calculation:   R Axis:      Text Interpretation:         Lipid Panel     Component Value Date/Time   CHOL 123 01/18/2024 0948   TRIG 116 01/18/2024 0948   HDL 32 (L) 01/18/2024 0948   CHOLHDL 3.8 01/18/2024 0948   LDLCALC 70 01/18/2024 0948   LABVLDL 21 01/18/2024 0948     Recent Labs: May 31, 2019 Potassium 4.2, glucose 125, creatinine 0.6, normal liver function tests, hemoglobin 15.9, platelets 207K, hemoglobin A1c 6.2% 03/04/2021 Potassium 4.4, glucose 144, creatinine 0.64, normal liver function tests, hemoglobin 15.1, platelets 206K, hemoglobin A1c 7.0% Recent Lipid Panel May 31, 2019 Total cholesterol 118, triglycerides 117, HDL 31, direct LDL 78 March 04, 2021 Total cholesterol 99, triglycerides 87, HDL 29, direct LDL 65 04/08/2022 Cholesterol 106, HDL 31, LDL 56, triglycerides 101  10/31/2023  hemoglobin A1c 7.1%, creatinine 0.56, potassium 4.2, ALT 22  Physical Exam:    VS:  BP (!) 140/62 (BP Location: Left Arm, Patient Position: Sitting)   Pulse 70   Ht 5' 7.5" (1.715 m)   Wt 212 lb 6.4 oz (96.3 kg)   SpO2 96%   BMI 32.78 kg/m     Wt Readings from Last 3 Encounters:  01/18/24 212 lb 6.4 oz (96.3 kg)  01/02/24 211 lb 3.2 oz (95.8 kg)  12/27/23 213 lb 3 oz (96.7 kg)      General: Alert, oriented x3, no distress, mildly obese with central adiposity Head: no evidence of trauma, PERRL, EOMI, no exophtalmos  or lid lag, no myxedema, no xanthelasma; normal ears, nose and oropharynx Neck: normal jugular venous pulsations and no hepatojugular reflux; brisk carotid pulses without delay and no carotid bruits Chest: clear to auscultation, no signs of consolidation by percussion or palpation, normal fremitus, symmetrical and full respiratory excursions Cardiovascular: normal position and quality of the apical impulse, regular rhythm, normal first and second heart sounds, no murmurs, rubs or gallops Abdomen: no tenderness or distention, no masses by palpation, no abnormal pulsatility or arterial bruits, normal bowel sounds, no hepatosplenomegaly Extremities: no clubbing, cyanosis or edema; 2+ radial, ulnar and brachial pulses bilaterally; 2+ right femoral, posterior tibial and dorsalis pedis pulses; 2+ left femoral, posterior tibial and dorsalis pedis pulses; no subclavian or femoral bruits Neurological: grossly nonfocal Psych: Normal mood and affect   ASSESSMENT:    1. Coronary artery disease of native artery of native heart with stable angina pectoris (HCC)   2. Dyslipidemia (high LDL; low HDL)   3. Controlled type 2 diabetes mellitus without complication, without long-term current use of insulin (HCC)   4. Essential hypertension   5. PAD (peripheral artery disease) (HCC)      PLAN:    In order of problems listed above:  CAD: On the current antianginal regimen with amlodipine and beta-blocker he has stable angina CCS class 1.  His exertional dyspnea is due to lung problems.  On statin and aspirin.  HLP: LDL in target range.  HDL remains low.  Reviewed the fact that there is no good pharmaceutical to  increase HDL cholesterol and that he needs to continue physical exercise and try to lose some additional weight.  He remains mildly obese.  He is taking both amlodipine and simvastatin, which could increase the side effects of simvastatin.  He does complain of some muscle spasms.  Will switch to  rosuvastatin and recheck a lipid profile in a couple of months. DM: Glycemic control is fair, but not ideal.  He has a hemoglobin A1c of 7.1% without taking any medication.  If the decision is made to start hypoglycemic agents, I would favor SGLT2 inhibitors or GLP-1 agonist agents. HTN: Blood pressure is adequately controlled on current medications  PAD: His history of peripheral vascular disease is not entirely clear but he has not had a significant event or procedure since 2006.  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  He is currently asymptomatic.   Medication Adjustments/Labs and Tests Ordered: Current medicines are reviewed at length with the patient today.  Concerns regarding medicines are outlined above.  Orders Placed This Encounter  Procedures   Lipid Profile    Meds ordered this encounter  Medications   rosuvastatin (CRESTOR) 10 MG tablet    Sig: Take 1 tablet (10 mg total) by mouth daily.    Dispense:  90 tablet    Refill:  3     Patient Instructions  Medication Instructions:  - START taking rosuvastatin 10mg  (1 tablet) daily - STOP taking simvastatin *If you need a refill on your cardiac medications before your next appointment, please call your pharmacy*   Lab Work: - Lipid panel today  If you have labs (blood work) drawn today and your tests are completely normal, you will receive your results only by: MyChart Message (if you have MyChart) OR A paper copy in the mail If you have any lab test that is abnormal or we need to change your treatment, we will call you to review the results.   Testing/Procedures: - None ordered  Follow-Up: At [facility], you and your health needs are our priority.  As part of our continuing mission to provide you with exceptional heart care, we have created designated Provider Care  Teams.  These Care Teams include your primary Cardiologist (physician) and Advanced Practice Providers (APPs -  Physician Assistants and Nurse Practitioners) who all work together to provide you with the care you need, when you need it.  We recommend signing up for the patient portal called "MyChart".  Sign up information is provided on this After Visit Summary.  MyChart is used to connect with patients for Virtual Visits (Telemedicine).  Patients are able to view lab/test results, encounter notes, upcoming appointments, etc.  Non-urgent messages can be sent to your provider as well.   To learn more about what you can do with MyChart, go to [email or web address].    Your next appointment:   1 year(s)  Provider:   Thurmon Fair, MD      Signed, Thurmon Fair, MD  01/22/2024 6:20 PM    [facility]

## 2024-01-19 ENCOUNTER — Encounter: Payer: Self-pay | Admitting: Cardiovascular Disease

## 2024-01-20 ENCOUNTER — Ambulatory Visit (HOSPITAL_BASED_OUTPATIENT_CLINIC_OR_DEPARTMENT_OTHER): Payer: Medicare HMO | Attending: Family Medicine | Admitting: Physical Therapy

## 2024-01-20 ENCOUNTER — Encounter (HOSPITAL_BASED_OUTPATIENT_CLINIC_OR_DEPARTMENT_OTHER): Payer: Self-pay | Admitting: Physical Therapy

## 2024-01-20 DIAGNOSIS — G8929 Other chronic pain: Secondary | ICD-10-CM | POA: Diagnosis not present

## 2024-01-20 DIAGNOSIS — M25612 Stiffness of left shoulder, not elsewhere classified: Secondary | ICD-10-CM | POA: Insufficient documentation

## 2024-01-20 DIAGNOSIS — M25512 Pain in left shoulder: Secondary | ICD-10-CM | POA: Diagnosis not present

## 2024-01-20 NOTE — Therapy (Signed)
 OUTPATIENT PHYSICAL THERAPY UPPER EXTREMITY EVALUATION   Patient Name: Vincent Meyers MRN: [id number] DOB:07/04/1946, 78 y.o., male Today's Date: 01/20/2024  END OF SESSION:  PT End of Session - 01/20/24 1358     Visit Number 1    Number of Visits 12    Date for PT Re-Evaluation 03/02/24    PT Start Time 1348    PT Stop Time 1430    PT Time Calculation (min) 42 min    Activity Tolerance Patient tolerated treatment well    Behavior During Therapy WFL for tasks assessed/performed             Past Medical History:  Diagnosis Date   Acid reflux    Angina pectoris (HCC)    Anxiety    Arthritis    Coronary artery disease    Erectile dysfunction    Hyperlipidemia    Hypertension    IBS (irritable bowel syndrome)    ILD (interstitial lung disease) (HCC)    OSA (obstructive sleep apnea)    Past Surgical History:  Procedure Laterality Date   APPENDECTOMY     COLON SURGERY     perforated bowel and hernia repair   ILIAC ARTERY ANEURYSM REPAIR Left 2006   Dr. Arbie Cookey (redo surgery)   REPLACEMENT TOTAL KNEE BILATERAL     RIGHT HEART CATH N/A 09/09/2023   Procedure: RIGHT HEART CATH;  Surgeon: Laurey Morale, MD;  Location: [facility] INVASIVE CV LAB;  Service: Cardiovascular;  Laterality: N/A;   TONSILLECTOMY     age 52   Patient Active Problem List   Diagnosis Date Noted   Chronic left shoulder pain 01/02/2024   Sensorineural hearing loss, bilateral 11/02/2023   Other specified disorders of eustachian tube, right ear 11/02/2023   Chronic rhinitis 11/02/2023   Deviated nasal septum 11/02/2023   Hypertrophy of nasal turbinates 11/02/2023   Impacted cerumen of right ear 11/02/2023   Hyponatremia 10/31/2023   Conductive hearing loss 05/24/2022   Wellness examination 04/13/2022   Bilateral tinnitus 01/14/2022   Erectile dysfunction 01/14/2022   COPD (chronic obstructive pulmonary disease) (HCC) 12/07/2021   Atherosclerosis of coronary artery of native heart without angina  pectoris 04/30/2021   Hypercholesterolemia 04/30/2021   OSA (obstructive sleep apnea) 11/17/2020   ILD (interstitial lung disease) (HCC) 11/17/2020   PAD (peripheral artery disease) (HCC) 05/29/2018   Benign prostatic hyperplasia with weak urinary stream 10/17/2017   Status post left partial knee replacement 06/06/2017   Angular cheilitis 11/17/2016   Irritable bowel syndrome with diarrhea 11/17/2016   Diabetes mellitus (HCC) 05/12/2016   Hyperlipemia, mixed 05/12/2016   Essential hypertension 05/12/2016   Class 1 obesity due to excess calories with serious comorbidity and body mass index (BMI) of 33.0 to 33.9 in adult 05/12/2016   DDD (degenerative disc disease), cervical 05/12/2016   Diaphragmatic hernia 05/12/2016   GERD (gastroesophageal reflux disease) 05/12/2016   Hydrocele 05/12/2016   OA (osteoarthritis) of knee 05/12/2016    PCP: Dr Raymond [name]   REFERRING PROVIDER: Dr Ceasar Mons [place]   REFERRING DIAG:  Diagnosis  M25.512,G89.29 (ICD-10-CM) - Chronic left shoulder pain    THERAPY DIAG:  No diagnosis found.  Rationale for Evaluation and Treatment: Rehabilitation  ONSET DATE: about a year   SUBJECTIVE:  SUBJECTIVE STATEMENT: Approximately year ago patient had insidious onset of left shoulder pain.  The pain is worse when he is using it reaching and reaching in certain positions.  He also has increased pain at night.  He cannot find a comfortable position at night.  He is very active.  He fixes cars.  Patient is on 2 L [place] O2. Hand dominance: Right  PERTINENT HISTORY: Interstitial lung disease, CAD, IBS, shoulder OA, angina. Acid reflux   PAIN:  Are you having pain? Yes: NPRS scale: no pain without movement 4/10 at worst  Pain location: lateral shoulder Pain description: aching   Aggravating factors: sleeping and reaching Relieving factors: not putting it in the position that hurts   PRECAUTIONS: On NCo2 2   RED FLAGS: None   WEIGHT BEARING RESTRICTIONS: No  FALLS:  Has patient fallen in last 6 months? No  LIVING ENVIRONMENT: OCCUPATION:  Still works on Environmental education officer:  Comes to [employer]  PLOF: Independent  PATIENT GOALS:  To be able to use his shoulder   NEXT MD VISIT:  Nothing scheduled   OBJECTIVE:  Note: Objective measures were completed at Evaluation unless otherwise noted.  DIAGNOSTIC FINDINGS:    IMPRESSION: Acromioclavicular degenerative changes. No acute osseous abnormalities.  PATIENT SURVEYS :  22/55 quick DASH  COGNITION: Overall cognitive status: Within functional limits for tasks assessed     SENSATION: WFL  POSTURE: Good   UPPER EXTREMITY ROM:   Active ROM Right eval Left eval  Shoulder flexion  130 total but pain around 80 degrees  Shoulder extension    Shoulder abduction    Shoulder adduction    Shoulder internal rotation L1 with tightness  Can reach to L5 with pain   Shoulder external rotation  Mild pain reaching behind head  Elbow flexion    Elbow extension    Wrist flexion    Wrist extension    Wrist ulnar deviation    Wrist radial deviation    Wrist pronation    Wrist supination    (Blank rows = not tested)  PROM  ROM Right eval Left eval  Shoulder flexion    Shoulder extension    Shoulder abduction    Shoulder adduction    Shoulder internal rotation    Shoulder external rotation  65) pain at end range  Elbow flexion    Elbow extension    Wrist flexion    Wrist extension    Wrist ulnar deviation    Wrist radial deviation    Wrist pronation    Wrist supination    (Blank rows = not tested)   UPPER EXTREMITY MMT:  MMT Right eval Left eval  Shoulder flexion 5 4  Shoulder extension    Shoulder abduction    Shoulder adduction    Shoulder internal rotation 5 4   Shoulder external rotation 4+ 3+  Middle trapezius    Lower trapezius    Elbow flexion    Elbow extension    Wrist flexion    Wrist extension    Wrist ulnar deviation    Wrist radial deviation    Wrist pronation    Wrist supination    Grip strength (lbs)    (Blank rows = not tested)     PALPATION:  Tender to palpation in left upper trap and left AC joint  TREATMENT DATE:  Manual: Grade 1 and 2 PA mobilization.  Trigger point release to left upper trap.  Reviewed self soft tissue mobilization upper trap.   Exercises - Shoulder External Rotation and Scapular Retraction with Resistance  - 1 x daily - 7 x weekly - 3 sets - 10 reps - Scapular Retraction with Resistance  - 1 x daily - 7 x weekly - 3 sets - 10 reps - Shoulder extension with resistance - Neutral  - 1 x daily - 7 x weekly - 3 sets - 10 reps - Theracane Over Shoulder  - 1 x daily - 7 x weekly - 3 sets - 10 reps   PATIENT EDUCATION: Education details: HEP, symptom management, progression of activity  Person educated: Patient Education method: Explanation, Demonstration, Tactile cues, Verbal cues, and Handouts Education comprehension: verbalized understanding, returned demonstration, verbal cues required, tactile cues required, and needs further education  HOME EXERCISE PROGRAM: Access Code: [email or web address] URL: [email or web address] Date: 01/22/2024 Prepared by: Lorayne Bender  CLINICAL IMPRESSION: Patient is a 78 year old male w/ onset of left shoulder pain approx 1 year ago. His shoulder hurts with certain positions such as overhead reaching. His X-ray shows AC joint OA with bone spurring. His pain follows a supraspinatus referral pattern down his left arm. He would benefit from skilled therapy t improve his ability to use left arm for work and to sleep better at night   OBJECTIVE  IMPAIRMENTS: decreased ROM, decreased strength, impaired UE functional use, and pain.   ACTIVITY LIMITATIONS: lifting and self feeding  PARTICIPATION LIMITATIONS: meal prep, community activity, and yard work  PERSONAL FACTORS: 1-2 comorbidities: anxiety  are also affecting patient's functional outcome.   REHAB POTENTIAL: Good  CLINICAL DECISION MAKING: Stable/uncomplicated  EVALUATION COMPLEXITY: Low  GOALS: Goals reviewed with patient? Yes  SHORT TERM GOALS: Target date: 02/17/2024    Patient will increase left shoulder flexion to 130 degrees ( equal to right ) actively without pain Baseline: Goal status: INITIAL  2.  Patient will reach behind his back to L4 equal to right side without pain Baseline:  Goal status: INITIAL  3.  Patient will demonstrate 4+ gross left upper extremity strength Baseline:  Goal status: INITIAL  4.  Patient will be independent with base exercise program Baseline:  Goal status: INITIAL   LONG TERM GOALS: Target date: 03/18/2024    Patient will sleep through the night without pain Baseline:  Goal status: INITIAL  2.  Patient will use left shoulder to mechanical work without increased pain Baseline:  Goal status: INITIAL  3.  Patient will use left shoulder for ADLs without increased pain Baseline:  Goal status: INITIAL   PLAN: PT FREQUENCY: 1-2x/week  PT DURATION: 8 weeks  PLANNED INTERVENTIONS: 97110-Therapeutic exercises, 97530- Therapeutic activity, [id number]- Neuromuscular re-education, 97535- Self Care, 62952- Manual therapy, [id number]- Aquatic Therapy, 97014- Electrical stimulation (unattended), 97035- Ultrasound, Patient/Family education,  Taping, Dry Needling, DME instructions, Cryotherapy, and Moist heat   PLAN FOR NEXT SESSION: Begin with inferior and posterior glides.  Consider trigger point release to upper trap.  Advance scapular exercises using RPE.  Consider T band IR.  Consider supine ABC.  Consider wand stretch and pec  stretch if tolerated.   Dessie Coma, PT 01/20/2024, 1:59 PM

## 2024-01-22 ENCOUNTER — Encounter: Payer: Self-pay | Admitting: Cardiovascular Disease

## 2024-01-22 ENCOUNTER — Encounter (HOSPITAL_BASED_OUTPATIENT_CLINIC_OR_DEPARTMENT_OTHER): Payer: Self-pay | Admitting: Physical Therapy

## 2024-01-28 ENCOUNTER — Other Ambulatory Visit (HOSPITAL_BASED_OUTPATIENT_CLINIC_OR_DEPARTMENT_OTHER): Payer: Self-pay | Admitting: Family Medicine

## 2024-01-28 DIAGNOSIS — K219 Gastro-esophageal reflux disease without esophagitis: Secondary | ICD-10-CM

## 2024-02-02 ENCOUNTER — Other Ambulatory Visit: Payer: Self-pay

## 2024-02-02 ENCOUNTER — Other Ambulatory Visit (HOSPITAL_COMMUNITY): Payer: Self-pay

## 2024-02-02 ENCOUNTER — Ambulatory Visit (HOSPITAL_BASED_OUTPATIENT_CLINIC_OR_DEPARTMENT_OTHER): Payer: Medicare HMO | Admitting: Physical Therapy

## 2024-02-02 NOTE — Progress Notes (Signed)
 Specialty Pharmacy Refill Coordination Note  Vincent Meyers is a 78 y.o. male contacted today regarding refills of specialty medication(s) Pirfenidone   Patient requested (Patient-Rptd) Delivery   Delivery date: (Patient-Rptd) 02/13/24   Verified address: (Patient-Rptd) [address] [place] [address]   Medication will be filled on 02/10/24.

## 2024-02-03 ENCOUNTER — Encounter

## 2024-02-05 ENCOUNTER — Other Ambulatory Visit (HOSPITAL_BASED_OUTPATIENT_CLINIC_OR_DEPARTMENT_OTHER): Payer: Self-pay | Admitting: Family Medicine

## 2024-02-05 DIAGNOSIS — E782 Mixed hyperlipidemia: Secondary | ICD-10-CM

## 2024-02-07 ENCOUNTER — Encounter: Admitting: Internal Medicine

## 2024-02-07 DIAGNOSIS — Z006 Encounter for examination for normal comparison and control in clinical research program: Secondary | ICD-10-CM

## 2024-02-07 DIAGNOSIS — J84112 Idiopathic pulmonary fibrosis: Secondary | ICD-10-CM

## 2024-02-07 NOTE — Research (Addendum)
 TITLE: A Phase 2, Randomized, Double-Blind, Placebo-Controlled Study to Evaluate the Safety and Efficacy of [id number] in Patients With Idiopathic Pulmonary Fibrosis  Protocol #: [id number] NCT: Sponsor:[employer]  Protocol Version as of 02/07/2024 is 6.0 and dated  19Apr 2024   Consent Version as of 02/07/2024 is V5  and dated 31Aug2023 Investigator Brochure as of 02/07/2024 is 8.0  and dated 20Jul2022  Study Design:  This is a randomized, double-blinded, placebo-controlled multicenter study to evaluate the safety and efficacy of [id number] in patients with IPF with or without standard-of-care. 2:1 randomization ratio to [id number] 150 mg BID or the matching placebo for  24 weeks.    Mechanism of Action Proline is one of the largest constituents of collagen. In IPF, there is excessive deposition of collagen. Prolyl-tRNA Synthetase (PRS) is an enzyme that conjugates proline. [id number] (Bersiporocin) is the world's first selective PRS inhibitor that decreases collagen formation and subsequent pro-fibrotic markers.  Administration  A dose of [id number] 150 mg BID will be administered orally in the fasted state or at least 2 hours after the last meal, for 24 weeks.   Key Inclusion Criteria age = 40 years Documented diagnosis of IPF per the 2018 ATS/ERS/JRS/ALAT Clinical Practice Meeting all of the following criteria during the screening period:             FVC >= 40% predicted            DLCOcor >=25% to <= 80%              (FEV1)/FVC ratio >= 0.7             Able to walk at least 150 m in , resting SpO2 should be >= 88% with a maximum of 6L O2/min  On a stable dose of pirfenidone OR nintedanib for at least 3 months OR on neither pirfenidone nor nintedanib.  Key Exclusion Criteria Currently taking medication known as a strong CYP2D6 inhibitor OR taking medication known to be CYP2D6 inducers OR CYP2D6 substrate with narrow therapeutic index. GFR  < 30  mL/min/1.22m2 moderate to severe hepatic impairment (Child-Pugh B and C). Patients with =3upper limit of normal of alanine aminotransferase, aspartate aminotransferase or gamma-glutamyl transpeptidase. Abnormal ECG findings including but not limited to QTc >500 ms.  Pharmacokinetics Urine PK data indicate that renal elimination is not the major clearance pathway for [id number].   Adverse effects and risk Overall, when [id number] was administered concomitantly with Pirfenidone and Nintedanib, [id number] 150 mg enteric-coated tablet was generally well tolerated and safe.   Safety data from edition number [id number], abstracted in March 2023.   Gastrointestinal adverse reactions (e.g Diarrhea, abdominal pain, nausea, vomiting) followed by CNS (headache and dizziness) as below were the most commonly observed adverse reactions.  Most adverse reactions were mild or moderate and reversible.  An improved enteric-coated 150 mg tablet (new) was reformulated to lower the initial dissolution rate in pH 6.8. Enteric-coated tablets (new) demonstrated that nausea and vomiting appear to have decreased. While the incidence rate of diarrhea was slightly increased than old formulation administration, the severity was all mild.  Below table comprises predominantly of enteric coated tablet.  Overall adverse event % n=229 Average of 4 different studies [id number] n=24(part 1) Esbriet combination [id number] n=24(part 2) Ofev combination   Nausea 54 out of 229 23.58% 6(25%) 2(8.33%)  Vomiting 37 out of 205 18.04% 1(4.17%) 1(4.17%)  Diarrhea 37 out of 229 16.15% 2(8.33%) 7(29.17%)  Abdominal pain 7 out of 229 3.05% 0 1(4.17%)  Constipation 4 out of 54  7.4% - -  Abdominal discomfort 4 out of 205  1.95% 0 1(4.17%)  Headache 25 out of 205  12.19% 4(16.67%) 1(4.17%)  Dizziness 8 out of 175 (4.57%) 3(12.50%) 1(4.17%)   Severity of TEAE Table  Overall adverse event % n=229 Average of 4 different studies  [id number] n=24(part 1) Esbriet combination [id number] n=24(part 2) Ofev combination   TEAE - occurrences  382 19 18  Severity- mild 351 (91.88%) 18 (94.73%) 17 (94.44%)   Moderate 31 (8.11%) 1 (5.26%) 1 (5.55%)  Severe 0 (0%) 0 (0%)  0 (0%)     91% TEAEs were mild  (96 of 106 TEAEs, including all 16 TEAEs in the placebo group). 9% (n=10) were moderate.   TEAEs occurred in a dose dependent manner with 7 of the 10 occurring in daily doses >= 600mg   All moderate TEAEs were classed as Gastrointestinal Disorders (nausea, vomiting, diarrhea, and epigastric discomfort), were considered recovered/resolved within 24 hours, and had no sequelae. There were no severe, life-threatening or fatal TEAEs across the study. There were no subjects with serious TEAEs, and there were no subjects with TEAEs leading to IP discontinuation during Part 1 (SAD) of the study.  EKG concerns There were no effects on ECG with dose up to 80 mg/kg in cynomolgus monkeys. In humans no EKG abnormalities reported  except in Part 2 ([id number] administered concomitantly with Nintedanib), one case of PR prolongation LFT concerns In rats, reversible, minimal DWN12088HCl-related centrilobular hepatocyte hypertrophy was noted in the liver administered >=50 mg/kg/day and was considered consistent with DWN12088HCl-related induction of hepatocellular enzymes.  In humans, on investigation in the MAD study of six subjects, hepatic enzyme was increased in one participant (16.7%).   Rare concerns based on animal data - not seen in humans Excessive salivation in rats  at doses >50 mg/kg/day and mildly diminished appetite in 1 monkey was observed on one occasion.  With extremely high doses of 1200 mg/kg decreased activity, increased/labored/shallow respiration, gasping, vocalizing and piloerection was observed in male (but not male) rats 6h post dose.   [facility] Clinical Research Coordinator note:   This visit  for Subject Vincent Meyers with DOB: [birth year] on 02/07/2024 for the above protocol is Visit/Encounter # 4  and is for purpose of research.   The consent for this encounter is under Protocol Version 6.0, Investigator Brochure Version 8.0, Consent Version v5 and is currently IRB approved.   Subject expressed continued interest and consent in continuing as a study subject. Subject confirmed that there was no change in contact information (e.g. address, telephone, email). Subject thanked for participation in research and contribution to science. In this visit 02/07/2024 the subject will be evaluated by Sub Investigator Marga Melnick MD. This research coordinator has verified that the above investigator is  up to date with his/her training logs.   The Subject was informed that the PI  continues to have oversight of the subject's visits and course through relevant discussions, reviews, and also specifically of this visit by routing of this note to the PI.  Patient returns for week 8/ visit 4.  Visit is out of window due to patient not making scheduled visit 14Mar2024. Patient reports allergy symptoms last 2 weeks.  Doing well on IP and overall.  All study assessments completed.  Please see subject binder for further details.  Signed by  Christell Constant MD  Clinical Research Coordinator [facility]  [place], [place] 2:17 PM 02/07/2024

## 2024-02-08 NOTE — Progress Notes (Signed)
 Vincent Meyers, DOB 1946/02/28, was seen as subject in a clinical trial /Protocol # [id number]. New are extrinsic rhinitis symptoms.  This is manifested as head and chest congestion with nonproductive cough. This is also associated with postnasal drainage; itchy watery eyes; some sneezing; and wheezing.  He has been employing intranasal Flonase with some benefit.  There are no associated purulent secretions, fever, or sinus pain. Additionally for almost 2 years he describes polydipsia and polyuria.  It is his opinion that he is nondiabetic; A1c was 7.1% on 10/31/2023, in diabetic range.  He is on no diabetic medications. Pertinent physical findings include nasal oxygen.  There is no significant erythema of the intranasal mucosa.  He has fine rales over the lower one half of the posterior thorax, greater on the right than the left.  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

## 2024-02-09 ENCOUNTER — Encounter (HOSPITAL_BASED_OUTPATIENT_CLINIC_OR_DEPARTMENT_OTHER): Payer: Medicare HMO | Admitting: Physical Therapy

## 2024-02-10 ENCOUNTER — Other Ambulatory Visit: Payer: Self-pay

## 2024-02-13 ENCOUNTER — Encounter: Payer: Self-pay | Admitting: Gastroenterology

## 2024-02-13 ENCOUNTER — Telehealth: Payer: Self-pay | Admitting: Gastroenterology

## 2024-02-13 ENCOUNTER — Ambulatory Visit: Payer: Medicare HMO | Admitting: Gastroenterology

## 2024-02-13 VITALS — BP 132/72 | HR 92 | Ht 67.5 in | Wt 210.0 lb

## 2024-02-13 DIAGNOSIS — K529 Noninfective gastroenteritis and colitis, unspecified: Secondary | ICD-10-CM

## 2024-02-13 DIAGNOSIS — Z860100 Personal history of colon polyps, unspecified: Secondary | ICD-10-CM | POA: Insufficient documentation

## 2024-02-13 MED ORDER — NA SULFATE-K SULFATE-MG SULF 17.5-3.13-1.6 GM/177ML PO SOLN: 1.0000 | Freq: Once | ORAL | 0 refills | Status: AC

## 2024-02-13 NOTE — Patient Instructions (Addendum)
  You have been scheduled for a colonoscopy. Please follow written instructions given to you at your visit today.   If you use inhalers (even only as needed), please bring them with you on the day of your procedure.  DO NOT TAKE 7 DAYS PRIOR TO TEST- Trulicity (dulaglutide) Ozempic, Wegovy (semaglutide) Mounjaro (tirzepatide) Bydureon Bcise (exanatide extended release)  DO NOT TAKE 1 DAY PRIOR TO YOUR TEST Rybelsus (semaglutide) Adlyxin (lixisenatide) Victoza (liraglutide) Byetta (exanatide) __________________________________________________________________________ _______________________________________________________  If your blood pressure at your visit was 140/90 or greater, please contact your primary care physician to follow up on this.  _______________________________________________________  If you are age 8 or older, your body mass index should be between 23-30. Your Body mass index is 32.41 kg/m. If this is out of the aforementioned range listed, please consider follow up with your Primary Care Provider.  If you are age 58 or younger, your body mass index should be between 19-25. Your Body mass index is 32.41 kg/m. If this is out of the aformentioned range listed, please consider follow up with your Primary Care Provider.   ________________________________________________________  The [place] [facility] providers would like to encourage you to use [facility] to communicate with providers for non-urgent requests or questions.  Due to long hold times on the telephone, sending your provider a message by [facility] may be a faster and more efficient way to get a response.  Please allow 48 business hours for a response.  Please remember that this is for non-urgent requests.  _______________________________________________________ It was a pleasure to see you today!  Thank you for trusting me with your gastrointestinal care!

## 2024-02-13 NOTE — Progress Notes (Signed)
 [facility] Progress Note  Chief Complaint: Chronic diarrhea and history of colon polyp  Subjective  Prior history  GI clinic consult note 12/23/2022 outlined patient's previous history of colon polyps with Dr. [name].  He also has about a decade of intermittent bloating gas and diarrhea. Colonoscopy with Dr. [name] 01/21/2023: Complete exam with good prep, 15 mm ascending colon TVA removed with EMR.  1 year surveillance recall recommended.  Random colon biopsies negative for microscopic colitis. April 2024 office note with ongoing intermittent diarrhea bloating and gas as well as longstanding reflux requiring omeprazole every other day.  Treated with Imodium and recommended de-escalation of PPI to H2 blocker.  TTG antibody negative   Discussed the use of AI scribe software for clinical note transcription with the patient, who gave verbal consent to proceed.  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  He experiences intermittent diarrhea, with stools ranging from somewhat formed to almost watery. The frequency of diarrhea has decreased since he stopped taking turmeric, which he suspected was exacerbating his symptoms. He typically would have a normal bowel movement in the morning, followed by diarrhea after breakfast. A severe episode in the past left him unable to control it, but currently, he manages with two to three trips to the bathroom in the morning. No greasy or oily appearance in stools and no blood in bowel movements.  He has a history of an advanced polyp removal from the right side of his colon, which required a special technique to ensure complete removal. He is concerned about colon cancer, especially after losing a friend to the disease, and is considering further evaluation.  He is participating in a clinical trial at [facility] for his pulmonary fibrosis, taking an  experimental drug. He is unsure if he is receiving the active medication or a placebo, as he has not noticed any side effects or changes since starting the trial three months ago.  He also periodically needs supplemental oxygen, more so after exertion, which is new from when I saw him.  No unexpected weight loss and he mentions gaining some weight back after participating in pulmonary rehabilitation. He acknowledges the challenges of maintaining physical activity but is trying to stay active despite his lung condition.    ROS: Cardiovascular:  no chest pain Respiratory: Chronic dyspnea Remainder systems negative except as above  The patient's Past Medical, Family and Social History were reviewed and are on file in the EMR. Past Medical History:  Diagnosis Date   Acid reflux    Angina pectoris (HCC)    Anxiety    Arthritis    Coronary artery disease    Erectile dysfunction    Hyperlipidemia    Hypertension    IBS (irritable bowel syndrome)    ILD (interstitial lung disease) (HCC)    OSA (obstructive sleep apnea)     Past Surgical History:  Procedure Laterality Date   APPENDECTOMY     COLON SURGERY     perforated bowel and hernia repair   ILIAC ARTERY ANEURYSM REPAIR Left 2006   Dr. Arbie Cookey (redo surgery)   REPLACEMENT TOTAL KNEE BILATERAL     RIGHT HEART CATH N/A 09/09/2023   Procedure: RIGHT HEART CATH;  Surgeon: Laurey Morale, MD;  Location: [facility] INVASIVE CV LAB;  Service: Cardiovascular;  Laterality: N/A;   TONSILLECTOMY     age 25     Objective:  Med list reviewed  Current Outpatient Medications:    amLODipine (NORVASC) 5 MG tablet, TAKE 1 TABLET (5 MG TOTAL) BY MOUTH DAILY., Disp: 90 tablet, Rfl: 2   aspirin EC 81 MG tablet, Take 81 mg by mouth daily., Disp: , Rfl:    atenolol (TENORMIN) 50 MG tablet, TAKE 1 AND 1/2 TABLETS BY MOUTH DAILY, Disp: 135 tablet, Rfl: 1   FIBER PO, Take 4 capsules by mouth 2 (two) times daily., Disp: , Rfl:    Flaxseed, Linseed,  (FLAXSEED OIL PO), Take 1 capsule by mouth daily., Disp: , Rfl:    fluticasone (FLONASE) 50 MCG/ACT nasal spray, Place 2 sprays into both nostrils daily., Disp: , Rfl:    hydrochlorothiazide (HYDRODIURIL) 25 MG tablet, TAKE 1 TABLET BY MOUTH EVERY DAY, Disp: 90 tablet, Rfl: 0   lisinopril (ZESTRIL) 40 MG tablet, TAKE 1 TABLET BY MOUTH EVERY DAY, Disp: 90 tablet, Rfl: 1   loperamide (IMODIUM A-D) 2 MG tablet, Take 2-4 mg by mouth 4 (four) times daily as needed for diarrhea or loose stools. (Patient not taking: Reported on 01/18/2024), Disp: , Rfl:    MAGNESIUM-POTASSIUM PO, Take 1 tablet by mouth daily. With zinc, Disp: , Rfl:    Misc Natural Products (TURMERIC CURCUMIN) CAPS, Take 1 capsule by mouth daily., Disp: , Rfl:    omeprazole (PRILOSEC) 20 MG capsule, TAKE 1 CAPSULE BY MOUTH EVERY DAY, Disp: 90 capsule, Rfl: 1   Pirfenidone 801 MG TABS, Take 1 tablet (801 mg total) by mouth 3 (three) times daily with meals., Disp: 270 tablet, Rfl: 5   rosuvastatin (CRESTOR) 10 MG tablet, Take 1 tablet (10 mg total) by mouth daily., Disp: 90 tablet, Rfl: 3   tadalafil (CIALIS) 20 MG tablet, Take 1 tablet (20 mg total) by mouth daily as needed. (Patient not taking: Reported on 01/18/2024), Disp: 10 tablet, Rfl: 11   Vital signs in last 24 hrs: There were no vitals filed for this visit. Wt Readings from Last 3 Encounters:  01/18/24 212 lb 6.4 oz (96.3 kg)  01/02/24 211 lb 3.2 oz (95.8 kg)  12/27/23 213 lb 3 oz (96.7 kg)    Physical Exam  Well-appearing, pleasant and conversational.  Oxygen saturation 95% on room air.  He says that it would drop if he were to walk down the hall. HEENT: sclera anicteric, oral mucosa moist without lesions Neck: supple, no thyromegaly, JVD or lymphadenopathy Cardiac: Regular without appreciable murmur,  no peripheral edema Pulm: Faint inspiratory crackles bilaterally, good air entry, no wheezing.  Breathing comfortably. Abdomen: soft, no tenderness, with active bowel  sounds. No guarding or palpable hepatosplenomegaly. Skin; warm and dry, no jaundice or rash   Labs:   ___________________________________________ Radiologic studies:   ____________________________________________ Other:   _____________________________________________   No diagnosis found.  Assessment and Plan Assessment & Plan Colonic Polyp Surveillance Advanced polyp previously removed from right colon. Increased recurrence risk necessitates follow-up colonoscopy. Procedure planned in hospital due to pulmonary condition, with anesthesia support available. - Schedule colonoscopy in hospital endoscopy lab for polyp surveillance.  The benefits and risks of the planned procedure(s) were described in detail with the patient or (when appropriate) their health care proxy.  Risks were outlined as including, but not limited to, bleeding, infection, perforation, adverse medication reaction leading to cardiac or pulmonary decompensation, pancreatitis (if ERCP).  The limitation of incomplete mucosal visualization was also discussed.  No guarantees or warranties were given.  Intermittent Diarrhea Ongoing intermittent diarrhea improved after discontinuing turmeric. No blood in  stools or weight loss. Manages condition by completing bowel movements in the morning. This occurs intermittently with periods of normal character bowel habits. Sam feels that he can live with this as long as it is only BMs in a day that are typically before noon.  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  Colonoscopy is reasonable, albeit with some increased respiratory risks from sedation necessitating procedure in the hospital outpatient endoscopy.  We had a discussion about all that and he wished to proceed.   30 minutes were spent on this encounter (including chart review, history/exam, counseling/coordination of care, and documentation) > 50% of  that time was spent on counseling and coordination of care.   Charlie Pitter III

## 2024-02-13 NOTE — Telephone Encounter (Signed)
 PT is scheduled for an appointment at 10am. He was completely unaware. I did advise of our late policy and he said he would be here by 1020am

## 2024-02-16 ENCOUNTER — Encounter (HOSPITAL_BASED_OUTPATIENT_CLINIC_OR_DEPARTMENT_OTHER): Payer: Medicare HMO | Admitting: Physical Therapy

## 2024-02-17 ENCOUNTER — Encounter (HOSPITAL_BASED_OUTPATIENT_CLINIC_OR_DEPARTMENT_OTHER): Payer: Self-pay | Admitting: Family Medicine

## 2024-02-21 ENCOUNTER — Encounter (HOSPITAL_BASED_OUTPATIENT_CLINIC_OR_DEPARTMENT_OTHER): Payer: Medicare HMO | Admitting: Physical Therapy

## 2024-02-28 ENCOUNTER — Encounter (HOSPITAL_BASED_OUTPATIENT_CLINIC_OR_DEPARTMENT_OTHER): Payer: Medicare HMO | Admitting: Physical Therapy

## 2024-02-28 ENCOUNTER — Encounter: Admitting: Internal Medicine

## 2024-02-28 ENCOUNTER — Encounter: Payer: Self-pay | Admitting: Internal Medicine

## 2024-02-28 DIAGNOSIS — J84112 Idiopathic pulmonary fibrosis: Secondary | ICD-10-CM

## 2024-02-28 DIAGNOSIS — Z006 Encounter for examination for normal comparison and control in clinical research program: Secondary | ICD-10-CM

## 2024-02-29 NOTE — Progress Notes (Signed)
 Vincent Meyers, DOB  11/20/1946,was seen as subject in a clinical trial /Protocol # : [id number] Cardiopulmonary symptoms are stable.  He states that he will not use his oxygen at rest.  O2 sats have been at least 92% on room air at rest.  He states that he previously had severe cough in the context of chronic head congestion & some associated chest congestion to a lesser extent with isolated phlegm production. The symptoms had dramatically improved using loratadine.  He does have some residual sneezing but no other extrinsic symptoms.   It is noted that his A1c was 7.1% on 10/31/2023.He describes excess thirst but no other diabetes related symptoms.  Other or new symptoms denied. Pertinent physical findings include: Fine rales noted over the lower half of the posterior thorax, greater on the right than the left.  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

## 2024-02-29 NOTE — Research (Signed)
 TITLE: A Phase 2, Randomized, Double-Blind, Placebo-Controlled Study to Evaluate the Safety and Efficacy of [id number] in Patients With Idiopathic Pulmonary Fibrosis  Protocol #: [id number] NCT: Sponsor:[employer]  Protocol Version as of 02/28/2024 is 6.0 and dated  19Apr2024 Consent Version as of 02/28/2024 is v5  and dated 31Aug2024 Investigator Brochure as of 02/28/2024 is 8.0 and dated 20Jul2022    Study Design:  This is a randomized, double-blinded, placebo-controlled multicenter study to evaluate the safety and efficacy of [id number] in patients with IPF with or without standard-of-care. 2:1 randomization ratio to [id number] 150 mg BID or the matching placebo for  24 weeks.    Mechanism of Action Proline is one of the largest constituents of collagen. In IPF, there is excessive deposition of collagen. Prolyl-tRNA Synthetase (PRS) is an enzyme that conjugates proline. [id number] (Bersiporocin) is the world's first selective PRS inhibitor that decreases collagen formation and subsequent pro-fibrotic markers.  Administration  A dose of [id number] 150 mg BID will be administered orally in the fasted state or at least 2 hours after the last meal, for 24 weeks.   Key Inclusion Criteria age = 40 years Documented diagnosis of IPF per the 2018 ATS/ERS/JRS/ALAT Clinical Practice Meeting all of the following criteria during the screening period:             FVC >= 40% predicted            DLCOcor >=25% to <= 80%              (FEV1)/FVC ratio >= 0.7             Able to walk at least 150 m in , resting SpO2 should be >= 88% with a maximum of 6L O2/min  On a stable dose of pirfenidone OR nintedanib for at least 3 months OR on neither pirfenidone nor nintedanib.  Key Exclusion Criteria Currently taking medication known as a strong CYP2D6 inhibitor OR taking medication known to be CYP2D6 inducers OR CYP2D6 substrate with narrow therapeutic index. GFR  < 30 mL/min/1.75m2 moderate to  severe hepatic impairment (Child-Pugh B and C). Patients with =3upper limit of normal of alanine aminotransferase, aspartate aminotransferase or gamma-glutamyl transpeptidase. Abnormal ECG findings including but not limited to QTc >500 ms.  Pharmacokinetics Urine PK data indicate that renal elimination is not the major clearance pathway for [id number].   Adverse effects and risk Overall, when [id number] was administered concomitantly with Pirfenidone and Nintedanib, [id number] 150 mg enteric-coated tablet was generally well tolerated and safe.   Safety data from edition number [id number], abstracted in March 2023.   Gastrointestinal adverse reactions (e.g Diarrhea, abdominal pain, nausea, vomiting) followed by CNS (headache and dizziness) as below were the most commonly observed adverse reactions.  Most adverse reactions were mild or moderate and reversible.  An improved enteric-coated 150 mg tablet (new) was reformulated to lower the initial dissolution rate in pH 6.8. Enteric-coated tablets (new) demonstrated that nausea and vomiting appear to have decreased. While the incidence rate of diarrhea was slightly increased than old formulation administration, the severity was all mild.  Below table comprises predominantly of enteric coated tablet.  Overall adverse event % n=229 Average of 4 different studies [id number] n=24(part 1) Esbriet combination [id number] n=24(part 2) Ofev combination   Nausea 54 out of 229 23.58% 6(25%) 2(8.33%)  Vomiting 37 out of 205 18.04% 1(4.17%) 1(4.17%)  Diarrhea 37 out of 229 16.15% 2(8.33%) 7(29.17%)  Abdominal pain 7 out of 229 3.05% 0 1(4.17%)  Constipation 4  out of 54  7.4% - -  Abdominal discomfort 4 out of 205  1.95% 0 1(4.17%)  Headache 25 out of 205  12.19% 4(16.67%) 1(4.17%)  Dizziness 8 out of 175 (4.57%) 3(12.50%) 1(4.17%)   Severity of TEAE Table  Overall adverse event % n=229 Average of 4 different studies [id number] n=24(part 1)  Esbriet combination [id number] n=24(part 2) Ofev combination   TEAE - occurrences  382 19 18  Severity- mild 351 (91.88%) 18 (94.73%) 17 (94.44%)   Moderate 31 (8.11%) 1 (5.26%) 1 (5.55%)  Severe 0 (0%) 0 (0%)  0 (0%)     91% TEAEs were mild  (96 of 106 TEAEs, including all 16 TEAEs in the placebo group). 9% (n=10) were moderate.   TEAEs occurred in a dose dependent manner with 7 of the 10 occurring in daily doses >= 600mg   All moderate TEAEs were classed as Gastrointestinal Disorders (nausea, vomiting, diarrhea, and epigastric discomfort), were considered recovered/resolved within 24 hours, and had no sequelae. There were no severe, life-threatening or fatal TEAEs across the study. There were no subjects with serious TEAEs, and there were no subjects with TEAEs leading to IP discontinuation during Part 1 (SAD) of the study.  EKG concerns There were no effects on ECG with dose up to 80 mg/kg in cynomolgus monkeys. In humans no EKG abnormalities reported  except in Part 2 ([id number] administered concomitantly with Nintedanib), one case of PR prolongation LFT concerns In rats, reversible, minimal DWN12088HCl-related centrilobular hepatocyte hypertrophy was noted in the liver administered >=50 mg/kg/day and was considered consistent with DWN12088HCl-related induction of hepatocellular enzymes.  In humans, on investigation in the MAD study of six subjects, hepatic enzyme was increased in one participant (16.7%).   Rare concerns based on animal data - not seen in humans Excessive salivation in rats  at doses >50 mg/kg/day and mildly diminished appetite in 1 monkey was observed on one occasion.  With extremely high doses of 1200 mg/kg decreased activity, increased/labored/shallow respiration, gasping, vocalizing and piloerection was observed in male (but not male) rats 6h post dose.  [facility] Clinical Research Coordinator note:   This visit for Subject Vincent Meyers  with DOB: 1946-05-24 on 02/28/2024 for the above protocol is Visit/Encounter # 5  and is for purpose of research.   The consent for this encounter is under Protocol Version 6.0, Investigator Brochure Version 8.0, Consent Version 5 and  is currently IRB approved.   Subject expressed continued interest and consent in continuing as a study subject. Subject confirmed that there was    no change in contact information (e.g. address, telephone, email). Subject thanked for participation in research and contribution to science. In this visit 02/28/2024 the subject will be evaluated by Sub-Investigator) named Marga Melnick MD. This research coordinator has verified that the above investigator is up to date with his/her training logs.   The Subject was  informed that the PI  continues to have oversight of the subject's visits and course through relevant discussions, reviews, and also specifically of this visit by routing of this note to the PI.  Patient has had not new or worsening problems.  Visit 5 assessments were completed except for spirometry and IP dispense; Subject to return 03/01/2024. Please see subject binder for further details.  Signed by  Christell Constant MD  Clinical Research Coordinator  [facility]  [place], [place] 8:34 AM 02/29/2024

## 2024-03-01 ENCOUNTER — Ambulatory Visit (HOSPITAL_BASED_OUTPATIENT_CLINIC_OR_DEPARTMENT_OTHER): Payer: Medicare HMO | Admitting: Family Medicine

## 2024-03-01 ENCOUNTER — Encounter: Payer: Self-pay | Admitting: Internal Medicine

## 2024-03-01 DIAGNOSIS — J84112 Idiopathic pulmonary fibrosis: Secondary | ICD-10-CM

## 2024-03-02 ENCOUNTER — Encounter: Payer: Self-pay | Admitting: Nurse Practitioner

## 2024-03-02 ENCOUNTER — Ambulatory Visit (INDEPENDENT_AMBULATORY_CARE_PROVIDER_SITE_OTHER): Payer: Self-pay | Admitting: Nurse Practitioner

## 2024-03-02 VITALS — BP 138/78 | HR 71 | Temp 97.3°F | Ht 67.5 in | Wt 212.6 lb

## 2024-03-02 DIAGNOSIS — E1159 Type 2 diabetes mellitus with other circulatory complications: Secondary | ICD-10-CM

## 2024-03-02 DIAGNOSIS — I2510 Atherosclerotic heart disease of native coronary artery without angina pectoris: Secondary | ICD-10-CM

## 2024-03-02 DIAGNOSIS — K580 Irritable bowel syndrome with diarrhea: Secondary | ICD-10-CM

## 2024-03-02 DIAGNOSIS — R350 Frequency of micturition: Secondary | ICD-10-CM

## 2024-03-02 DIAGNOSIS — J84112 Idiopathic pulmonary fibrosis: Secondary | ICD-10-CM | POA: Diagnosis not present

## 2024-03-02 DIAGNOSIS — R0981 Nasal congestion: Secondary | ICD-10-CM

## 2024-03-02 DIAGNOSIS — G4733 Obstructive sleep apnea (adult) (pediatric): Secondary | ICD-10-CM

## 2024-03-02 DIAGNOSIS — K219 Gastro-esophageal reflux disease without esophagitis: Secondary | ICD-10-CM

## 2024-03-02 DIAGNOSIS — N401 Enlarged prostate with lower urinary tract symptoms: Secondary | ICD-10-CM

## 2024-03-02 DIAGNOSIS — E782 Mixed hyperlipidemia: Secondary | ICD-10-CM | POA: Diagnosis not present

## 2024-03-02 DIAGNOSIS — I10 Essential (primary) hypertension: Secondary | ICD-10-CM

## 2024-03-02 DIAGNOSIS — N529 Male erectile dysfunction, unspecified: Secondary | ICD-10-CM | POA: Diagnosis not present

## 2024-03-02 MED ORDER — OMEPRAZOLE 20 MG PO CPDR: 20.0000 mg | DELAYED_RELEASE_CAPSULE | Freq: Every day | ORAL | 1 refills | Status: DC

## 2024-03-02 NOTE — Progress Notes (Signed)
 Careteam: Patient Care Team: Sharon Seller, NP as PCP - General (Geriatric Medicine) Thurmon Fair, MD as PCP - Cardiology (Cardiology)  PLACE OF SERVICE:  [facility]  Advanced Directive information    No Known Allergies  Chief Complaint  Patient presents with   Establish Care    New patient to establish care. Discuss refill on acid reflux medication. Pill bottles present at initial appointment. Discuss A1c and diabetic management.     Discussed the use of AI scribe software for clinical note transcription with the patient, who gave verbal consent to proceed.  History of Present Illness   Vincent Meyers "Sam" is a 78 year old male here today to establish care.   He is concerned about his diabetes management, specifically his A1c levels, which have been increasing over the years. His last A1c was 7.1, and he is due for another test today. He is interested in exploring options to manage his blood sugar levels more effectively.  He has a history of hypertension, managed with multiple medications including amlodipine 5 mg daily, atenolol 1.5 tablets daily, hydrochlorothiazide, and lisinopril 40 mg daily. His blood pressure is typically well controlled.  He has idiopathic pulmonary fibrosis and is on pirfenidone for this condition. His oxygen levels remain in the 90s at rest, but any exertion causes a significant drop. He uses portable oxygen and attends pulmonary rehab to maintain his exercise routine. He is also in a research study.   He has a history of coronary artery disease and is on aspirin, atenolol, and rosuvastatin (recently switched from simvastatin) for management. He has had a heart catheterization as part of a previous study. No chest pain is reported.  He has obstructive sleep apnea and uses a CPAP machine. There are no new concerns or changes in management discussed.  He has benign prostatic hypertrophy, for which he was on medication but discontinued due to  lack of improvement.  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 [name]. He is scheduled for a colonoscopy next month. He uses fiber and flaxseed to manage his symptoms.  He takes omeprazole 20 mg daily for acid reflux and reports needing a refill as he is down to one pill.  He also takes Flonase and Claritin for allergies and congestion, which have been helpful.  He is a former smoker, having quit in 1989, and has a history of alcohol use but currently drinks minimally. He has two children, one living locally and the other in [place].      Review of Systems:  Review of Systems  Constitutional:  Negative for chills, fever and weight loss.  HENT:  Negative for tinnitus.   Respiratory:  Positive for shortness of breath. Negative for cough and sputum production.   Cardiovascular:  Negative for chest pain, palpitations and leg swelling.  Gastrointestinal:  Positive for heartburn. Negative for abdominal pain, constipation and diarrhea.  Genitourinary:  Negative for dysuria, frequency and urgency.  Musculoskeletal:  Negative for back pain, falls, joint pain and myalgias.  Skin: Negative.   Neurological:  Negative for dizziness and headaches.  Psychiatric/Behavioral:  Negative for depression and memory loss. The patient does not have insomnia.     Past Medical History:  Diagnosis Date   Acid reflux    Angina pectoris (HCC)    Anxiety    Arthritis    Coronary artery disease    Erectile dysfunction    Hyperlipidemia    Hypertension  IBS (irritable bowel syndrome)    ILD (interstitial lung disease) (HCC)    OSA (obstructive sleep apnea)    Past Surgical History:  Procedure Laterality Date   APPENDECTOMY     COLON SURGERY     perforated bowel and hernia repair   ILIAC ARTERY ANEURYSM REPAIR Left 2006   Dr. Arbie Cookey (redo surgery)   REPLACEMENT TOTAL KNEE BILATERAL     RIGHT HEART CATH N/A 09/09/2023   Procedure: RIGHT HEART  CATH;  Surgeon: Laurey Morale, MD;  Location: [facility] INVASIVE CV LAB;  Service: Cardiovascular;  Laterality: N/A;   TONSILLECTOMY     age 58   Social History:   reports that he quit smoking about 36 years ago. His smoking use included cigarettes. He started smoking about 59 years ago. He has a 46 pack-year smoking history. He has been exposed to tobacco smoke. He has never used smokeless tobacco. He reports that he does not currently use alcohol after a past usage of about 4.0 - 5.0 standard drinks of alcohol per week. He reports that he does not use drugs.  Family History  Problem Relation Age of Onset   Parkinson's disease Mother    Heart attack Father    Stroke Father    Alzheimer's disease Father    Alzheimer's disease Sister    Other Sister        degenerative muscle disease   Hypertension Daughter    Obesity Daughter    Heart murmur Daughter    Colon cancer Neg Hx    Esophageal cancer Neg Hx    Rectal cancer Neg Hx    Stomach cancer Neg Hx     Medications: Patient's Medications  New Prescriptions   No medications on file  Previous Medications   AMLODIPINE (NORVASC) 5 MG TABLET    TAKE 1 TABLET (5 MG TOTAL) BY MOUTH DAILY.   ASPIRIN EC 81 MG TABLET    Take 81 mg by mouth daily.   ATENOLOL (TENORMIN) 50 MG TABLET    TAKE 1 AND 1/2 TABLETS BY MOUTH DAILY   COENZYME Q10 (COQ10 PO)    Take 500 mg by mouth daily.   FIBER PO    Take 4 capsules by mouth 2 (two) times daily.   FLAXSEED, LINSEED, (FLAXSEED OIL PO)    Take 1 capsule by mouth daily.   FLUTICASONE (FLONASE) 50 MCG/ACT NASAL SPRAY    Place 2 sprays into both nostrils daily.   HYDROCHLOROTHIAZIDE (HYDRODIURIL) 25 MG TABLET    TAKE 1 TABLET BY MOUTH EVERY DAY   LISINOPRIL (ZESTRIL) 40 MG TABLET    TAKE 1 TABLET BY MOUTH EVERY DAY   LOPERAMIDE (IMODIUM A-D) 2 MG TABLET    Take 2-4 mg by mouth 4 (four) times daily as needed for diarrhea or loose stools.   LORATADINE (CLARITIN) 10 MG TABLET    Take 10 mg by mouth daily.    MAGNESIUM-POTASSIUM PO    Take 1 tablet by mouth daily. With zinc   OMEPRAZOLE (PRILOSEC) 20 MG CAPSULE    TAKE 1 CAPSULE BY MOUTH EVERY DAY   PIRFENIDONE 801 MG TABS    Take 1 tablet (801 mg total) by mouth 3 (three) times daily with meals.   ROSUVASTATIN (CRESTOR) 10 MG TABLET    Take 1 tablet (10 mg total) by mouth daily.   TADALAFIL (CIALIS) 20 MG TABLET    Take 1 tablet (20 mg total) by mouth daily as needed.   TURMERIC-GINGER PO  Take 2,600 mg by mouth daily.   UNABLE TO FIND    Med Name: Taking experimental drug or possible placeb for lungs, managed by [employer] with [place]  Modified Medications   No medications on file  Discontinued Medications   MISC NATURAL PRODUCTS (TURMERIC CURCUMIN) CAPS    Take 1 capsule by mouth daily.   UNABLE TO FIND    Take 450 mg by mouth daily. Med Name: Potassium    Physical Exam:  Vitals:   03/02/24 1444  BP: 138/78  Pulse: 71  Temp: (!) 97.3 F (36.3 C)  TempSrc: Temporal  SpO2: 97%  Weight: 212 lb 9.6 oz (96.4 kg)  Height: 5' 7.5" (1.715 m)   Body mass index is 32.81 kg/m. Wt Readings from Last 3 Encounters:  03/02/24 212 lb 9.6 oz (96.4 kg)  02/13/24 210 lb (95.3 kg)  01/18/24 212 lb 6.4 oz (96.3 kg)    Physical Exam Constitutional:      General: He is not in acute distress.    Appearance: He is well-developed. He is not diaphoretic.  HENT:     Head: Normocephalic and atraumatic.     Right Ear: External ear normal.     Left Ear: External ear normal.     Mouth/Throat:     Pharynx: No oropharyngeal exudate.  Eyes:     Conjunctiva/sclera: Conjunctivae normal.     Pupils: Pupils are equal, round, and reactive to light.  Cardiovascular:     Rate and Rhythm: Normal rate and regular rhythm.     Heart sounds: Normal heart sounds.  Pulmonary:     Effort: Pulmonary effort is normal.     Breath sounds: Normal breath sounds.  Abdominal:     General: Bowel sounds are normal.     Palpations: Abdomen is soft.   Musculoskeletal:        General: No tenderness.     Cervical back: Normal range of motion and neck supple.     Right lower leg: No edema.     Left lower leg: No edema.  Skin:    General: Skin is warm and dry.  Neurological:     Mental Status: He is alert and oriented to person, place, and time.     Labs reviewed: Basic Metabolic Panel: Recent Labs    09/09/23 1124 09/09/23 1144 09/09/23 1318 09/09/23 1319 10/14/23 1030 10/31/23 1504  NA 133* 135   < > 135 132* 134  K 4.4 4.2   < > 4.1 4.9 4.2  CL 97* 94*  --   --  92* 93*  CO2 27  --   --   --  23 26  GLUCOSE 179* 177*  --   --  225* 107*  BUN 9 10  --   --  10 10  CREATININE 0.55* 0.50*  --   --  0.60* 0.56*  CALCIUM 9.1  --   --   --  9.6 9.8   < > = values in this interval not displayed.   Liver Function Tests: Recent Labs    03/15/23 1043 05/18/23 1005  AST 20 25  ALT 17 22  ALKPHOS 57 59  BILITOT 0.4 0.6  PROT 7.0 7.1  ALBUMIN 4.3 4.2   No results for input(s): "LIPASE", "AMYLASE" in the last 8760 hours. No results for input(s): "AMMONIA" in the last 8760 hours. CBC: Recent Labs    04/01/23 1347 09/09/23 1124 09/09/23 1144 09/09/23 1318 09/09/23 1319  WBC 13.9* 10.5  --   --   --  NEUTROABS 10.8*  --   --   --   --   HGB 15.1 14.7 15.0 15.6 15.6  HCT 44.3 44.1 44.0 46.0 46.0  MCV 79.7 81.2  --   --   --   PLT 214.0 169  --   --   --    Lipid Panel: Recent Labs    01/18/24 0948  CHOL 123  HDL 32*  LDLCALC 70  TRIG 409  CHOLHDL 3.8   TSH: No results for input(s): "TSH" in the last 8760 hours. A1C: Lab Results  Component Value Date   HGBA1C 7.1 (H) 10/31/2023     Assessment/Plan 1. Essential hypertension (Primary) -Blood pressure well controlled, goal bp <140/90 Continue current medications and dietary modifications follow metabolic panel  2. Irritable bowel syndrome with diarrhea -ongoing, continues to follow up with GI, continues on fiber   3. Nasal  congestion -improvement noted with Flonase  5. Hyperlipemia, mixed -started on crestor, will follow up labs today - Lipid panel - COMPLETE METABOLIC PANEL WITHOUT GFR  6. Erectile dysfunction, unspecified erectile dysfunction type Managed by [facility]     7. IPF (idiopathic pulmonary fibrosis) (HCC) Followed by pulmonary, continues in research trail.   8. Benign prostatic hyperplasia with urinary frequency Ongoing, previously on medication that did not show improvement in symptoms  9. Coronary artery disease involving native coronary artery of native heart without angina pectoris -stable, no reports of chest pains Continues on ASA and atenolol  - Lipid panel - COMPLETE METABOLIC PANEL WITHOUT GFR - CBC with Differential/Platelet  10. Type 2 diabetes mellitus with other circulatory complication, without long-term current use of insulin (HCC) -Encouraged dietary compliance, routine foot care/monitoring and to keep up with diabetic eye exams through ophthalmology  - Hemoglobin A1c to recheck A1c, goal A1c <7.  11. Mild acid reflux Controlled on current regimen  - omeprazole (PRILOSEC) 20 MG capsule; Take 1 capsule (20 mg total) by mouth daily.  Dispense: 90 capsule; Refill: 1  12. OSA (obstructive sleep apnea) Continues on cpap   Return in about 3 months (around 06/01/2024) for routine follow up, labs at time of visit.:  Morganna Styles K. [facility] [phone]

## 2024-03-03 ENCOUNTER — Encounter: Payer: Self-pay | Admitting: Cardiovascular Disease

## 2024-03-03 LAB — LIPID PANEL
Cholesterol: 118 mg/dL (ref <200)
HDL: 36 mg/dL — ABNORMAL LOW (ref >=40)
LDL Cholesterol (Calc): 56 mg/dL
Non-HDL Cholesterol (Calc): 82 mg/dL (ref <130)
Total CHOL/HDL Ratio: 3.3 (calc) (ref <5.0)
Triglycerides: 191 mg/dL — ABNORMAL HIGH (ref <150)

## 2024-03-03 LAB — CBC WITH DIFFERENTIAL/PLATELET
Absolute Lymphocytes: 1391 {cells}/uL (ref 850–3900)
Absolute Monocytes: 856 {cells}/uL (ref 200–950)
Basophils Absolute: 75 {cells}/uL (ref 0–200)
Basophils Relative: 0.7 %
Eosinophils Absolute: 492 {cells}/uL (ref 15–500)
Eosinophils Relative: 4.6 %
HCT: 44.2 % (ref 38.5–50.0)
Hemoglobin: 14.7 g/dL (ref 13.2–17.1)
MCH: 27.6 pg (ref 27.0–33.0)
MCHC: 33.3 g/dL (ref 32.0–36.0)
MCV: 82.9 fL (ref 80.0–100.0)
MPV: 9.1 fL (ref 7.5–12.5)
Monocytes Relative: 8 %
Neutro Abs: 7886 {cells}/uL — ABNORMAL HIGH (ref 1500–7800)
Neutrophils Relative %: 73.7 %
Platelets: 196 10*3/uL (ref 140–400)
RBC: 5.33 10*6/uL (ref 4.20–5.80)
RDW: 12.4 % (ref 11.0–15.0)
Total Lymphocyte: 13 %
WBC: 10.7 10*3/uL (ref 3.8–10.8)

## 2024-03-03 LAB — COMPLETE METABOLIC PANEL WITHOUT GFR
AG Ratio: 1.9 (calc) (ref 1.0–2.5)
ALT: 22 U/L (ref 9–46)
AST: 23 U/L (ref 10–35)
Albumin: 4.5 g/dL (ref 3.6–5.1)
Alkaline phosphatase (APISO): 65 U/L (ref 35–144)
BUN/Creatinine Ratio: 23 (calc) — ABNORMAL HIGH (ref 6–22)
BUN: 11 mg/dL (ref 7–25)
CO2: 30 mmol/L (ref 20–32)
Calcium: 9.3 mg/dL (ref 8.6–10.3)
Chloride: 94 mmol/L — ABNORMAL LOW (ref 98–110)
Creat: 0.48 mg/dL — ABNORMAL LOW (ref 0.70–1.28)
Globulin: 2.4 g/dL (ref 1.9–3.7)
Glucose, Bld: 196 mg/dL — ABNORMAL HIGH (ref 65–139)
Potassium: 4.3 mmol/L (ref 3.5–5.3)
Sodium: 133 mmol/L — ABNORMAL LOW (ref 135–146)
Total Bilirubin: 0.5 mg/dL (ref 0.2–1.2)
Total Protein: 6.9 g/dL (ref 6.1–8.1)

## 2024-03-03 LAB — HEMOGLOBIN A1C
Hgb A1c MFr Bld: 7.1 %{Hb} — ABNORMAL HIGH (ref <5.7)
Mean Plasma Glucose: 157 mg/dL
eAG (mmol/L): 8.7 mmol/L

## 2024-03-05 ENCOUNTER — Encounter: Payer: Self-pay | Admitting: Nurse Practitioner

## 2024-03-05 MED ORDER — ATORVASTATIN CALCIUM 20 MG PO TABS: 20.0000 mg | ORAL_TABLET | Freq: Every day | ORAL | 3 refills | Status: DC

## 2024-03-05 NOTE — Telephone Encounter (Signed)
Message routed to PCP Eubanks, Jessica K, NP  

## 2024-03-05 NOTE — Telephone Encounter (Signed)
 Unfortunately, we had to switch from the simvastatin to rosuvastatin due to the interaction with amlodipine. If it is OK with him, let's try atorvastatin 20 mg daily, since it is closer chemically to simvastatin (compared to rosuvastatin), so hopefully he will tolerate it well.

## 2024-03-06 ENCOUNTER — Other Ambulatory Visit: Payer: Self-pay

## 2024-03-06 NOTE — Progress Notes (Signed)
 Specialty Pharmacy Refill Coordination Note  Vincent Meyers is a 78 y.o. male contacted today regarding refills of specialty medication(s) Pirfenidone   Patient requested Delivery   Delivery date: 03/09/24   Verified address: [address] [place] [address]   Medication will be filled on 04.17.25.

## 2024-03-08 ENCOUNTER — Other Ambulatory Visit: Payer: Self-pay

## 2024-03-08 ENCOUNTER — Encounter: Payer: Self-pay | Admitting: Internal Medicine

## 2024-03-08 DIAGNOSIS — J84112 Idiopathic pulmonary fibrosis: Secondary | ICD-10-CM

## 2024-03-08 NOTE — Research (Signed)
  [facility] Clinical Research Coordinator note:   This visit for Subject Vincent Meyers with DOB: 14-Dec-1945 on 03/01/2024 for the above protocol is Visit/Encounter # unscheduled spirometry  and is for purpose of research.   The consent for this encounter is under Protocol Version 6.0, Investigator Brochure Version 8.0, Consent Version modified [id number] and  is currently IRB approved.   Subject returned for spirometry DLCO assessments for [employer] research study visit 5 as staff was not available on date of visit 28 Feb 2024.  Assessments completed with out difficulty.  Subject reports no new or worsening problems or changes.   Signed by  Mauro Sox MD  Clinical Research Coordinator  [facility]  [place], [place] 12:00 PM 03/08/2024

## 2024-03-08 NOTE — Research (Signed)
 TITLE: A Phase 2, Randomized, Double-Blind, Placebo-Controlled Study to Evaluate the Safety and Efficacy of [id number] in Patients With Idiopathic Pulmonary Fibrosis  Protocol #: [id number] NCT: Sponsor:[employer]  Protocol Version as of 03/08/2024 is 6.0 and dated  11 Mar 2023 Consent Version as of 03/08/2024 is V5 Designer, fashion/clothing as of 03/08/2024 is 8.0  Study Design:  This is a randomized, double-blinded, placebo-controlled multicenter study to evaluate the safety and efficacy of [id number] in patients with IPF with or without standard-of-care. 2:1 randomization ratio to [id number] 150 mg BID or the matching placebo for  24 weeks.    Mechanism of Action Proline is one of the largest constituents of collagen. In IPF, there is excessive deposition of collagen. Prolyl-tRNA Synthetase (PRS) is an enzyme that conjugates proline. [id number] (Bersiporocin) is the world's first selective PRS inhibitor that decreases collagen formation and subsequent pro-fibrotic markers.  Administration  A dose of [id number] 150 mg BID will be administered orally in the fasted state or at least 2 hours after the last meal, for 24 weeks.   Key Inclusion Criteria age = 40 years Documented diagnosis of IPF per the 2018 ATS/ERS/JRS/ALAT Clinical Practice Meeting all of the following criteria during the screening period:             FVC >= 40% predicted            DLCOcor >=25% to <= 80%              (FEV1)/FVC ratio >= 0.7             Able to walk at least 150 m in , resting SpO2 should be >= 88% with a maximum of 6L O2/min  On a stable dose of pirfenidone OR nintedanib for at least 3 months OR on neither pirfenidone nor nintedanib.  Key Exclusion Criteria Currently taking medication known as a strong CYP2D6 inhibitor OR taking medication known to be CYP2D6 inducers OR CYP2D6 substrate with narrow therapeutic index. GFR  < 30 mL/min/1.83m2 moderate to severe hepatic impairment (Child-Pugh  B and C). Patients with =3upper limit of normal of alanine aminotransferase, aspartate aminotransferase or gamma-glutamyl transpeptidase. Abnormal ECG findings including but not limited to QTc >500 ms.  Pharmacokinetics Urine PK data indicate that renal elimination is not the major clearance pathway for [id number].   Adverse effects and risk Overall, when [id number] was administered concomitantly with Pirfenidone and Nintedanib, [id number] 150 mg enteric-coated tablet was generally well tolerated and safe.   Safety data from edition number [id number], abstracted in March 2023.   Gastrointestinal adverse reactions (e.g Diarrhea, abdominal pain, nausea, vomiting) followed by CNS (headache and dizziness) as below were the most commonly observed adverse reactions.  Most adverse reactions were mild or moderate and reversible.  An improved enteric-coated 150 mg tablet (new) was reformulated to lower the initial dissolution rate in pH 6.8. Enteric-coated tablets (new) demonstrated that nausea and vomiting appear to have decreased. While the incidence rate of diarrhea was slightly increased than old formulation administration, the severity was all mild.  Below table comprises predominantly of enteric coated tablet.  Overall adverse event % n=229 Average of 4 different studies [id number] n=24(part 1) Esbriet combination [id number] n=24(part 2) Ofev combination   Nausea 54 out of 229 23.58% 6(25%) 2(8.33%)  Vomiting 37 out of 205 18.04% 1(4.17%) 1(4.17%)  Diarrhea 37 out of 229 16.15% 2(8.33%) 7(29.17%)  Abdominal pain 7 out of 229 3.05% 0 1(4.17%)  Constipation 4 out of 54  7.4% - -  Abdominal discomfort 4 out of 205  1.95% 0 1(4.17%)  Headache 25 out of 205  12.19% 4(16.67%) 1(4.17%)  Dizziness 8 out of 175 (4.57%) 3(12.50%) 1(4.17%)   Severity of TEAE Table  Overall adverse event % n=229 Average of 4 different studies [id number] n=24(part 1) Esbriet combination  [id number] n=24(part 2) Ofev combination   TEAE - occurrences  382 19 18  Severity- mild 351 (91.88%) 18 (94.73%) 17 (94.44%)   Moderate 31 (8.11%) 1 (5.26%) 1 (5.55%)  Severe 0 (0%) 0 (0%)  0 (0%)     91% TEAEs were mild  (96 of 106 TEAEs, including all 16 TEAEs in the placebo group). 9% (n=10) were moderate.   TEAEs occurred in a dose dependent manner with 7 of the 10 occurring in daily doses >= 600mg   All moderate TEAEs were classed as Gastrointestinal Disorders (nausea, vomiting, diarrhea, and epigastric discomfort), were considered recovered/resolved within 24 hours, and had no sequelae. There were no severe, life-threatening or fatal TEAEs across the study. There were no subjects with serious TEAEs, and there were no subjects with TEAEs leading to IP discontinuation during Part 1 (SAD) of the study.  EKG concerns There were no effects on ECG with dose up to 80 mg/kg in cynomolgus monkeys. In humans no EKG abnormalities reported  except in Part 2 ([id number] administered concomitantly with Nintedanib), one case of PR prolongation LFT concerns In rats, reversible, minimal DWN12088HCl-related centrilobular hepatocyte hypertrophy was noted in the liver administered >=50 mg/kg/day and was considered consistent with DWN12088HCl-related induction of hepatocellular enzymes.  In humans, on investigation in the MAD study of six subjects, hepatic enzyme was increased in one participant (16.7%).   Rare concerns based on animal data - not seen in humans Excessive salivation in rats  at doses >50 mg/kg/day and mildly diminished appetite in 1 monkey was observed on one occasion.  With extremely high doses of 1200 mg/kg decreased activity, increased/labored/shallow respiration, gasping, vocalizing and piloerection was observed in male (but not male) rats 6h post dose.  Subject returns to day for IP dispense since IP kit unavailable on date of visit 5.  Previous IP kit and patient diary  returned and new IP kit dispensed.  Next research visit scheduled May 6 9:30 am  Signed by  Mauro Sox MD  Clinical Research Coordinator [facility]  [place], [place] 30:16 PM 03/08/2024

## 2024-03-26 ENCOUNTER — Other Ambulatory Visit: Payer: Self-pay | Admitting: Family Medicine

## 2024-03-27 ENCOUNTER — Encounter

## 2024-04-02 ENCOUNTER — Other Ambulatory Visit (HOSPITAL_COMMUNITY): Payer: Self-pay

## 2024-04-02 ENCOUNTER — Other Ambulatory Visit: Payer: Self-pay

## 2024-04-04 ENCOUNTER — Other Ambulatory Visit: Payer: Self-pay

## 2024-04-04 ENCOUNTER — Other Ambulatory Visit: Payer: Self-pay | Admitting: Nurse Practitioner

## 2024-04-04 NOTE — Progress Notes (Signed)
 Specialty Pharmacy Refill Coordination Note  Vincent Meyers is a 78 y.o. male contacted today regarding refills of specialty medication(s) Pirfenidone    Patient requested Delivery   Delivery date: 04/05/24   Verified address: [address] [place] [address]   Medication will be filled on 05.14.25.

## 2024-04-10 ENCOUNTER — Encounter

## 2024-04-11 ENCOUNTER — Encounter: Admitting: Internal Medicine

## 2024-04-11 ENCOUNTER — Encounter: Payer: Self-pay | Admitting: Internal Medicine

## 2024-04-11 DIAGNOSIS — R233 Spontaneous ecchymoses: Secondary | ICD-10-CM

## 2024-04-11 DIAGNOSIS — K580 Irritable bowel syndrome with diarrhea: Secondary | ICD-10-CM

## 2024-04-11 DIAGNOSIS — J849 Interstitial pulmonary disease, unspecified: Secondary | ICD-10-CM

## 2024-04-11 NOTE — Progress Notes (Signed)
 Vincent Meyers,DOB 09-05-1946, was seen as subject in a clinical trial /Protocol # [id number].  This is a randomized, double-blind, placebo controlled multicenter study to evaluate the safety and efficacy of DWN 12088 in patients with IPF with or without standard of care.  Cardiopulmonary symptoms are stable except for some increased mucus.  There is no purulence or associated infectious symptoms. Other or new symptoms include recent significant GI symptoms manifesting as nausea and vomiting over a 6 to 8-week period. He stated that this would progress to the point of dry heaves after nausea and vomiting occurring 2-3 times a day.  On a 10 scale this was rated as 10.  He associated this with eating snacks within 2 hours of taking his medicines at 10 AM.  He stopped snacking and symptoms have essentially resolved.  He states that he may have nausea occasionally without any vomiting.  This is in the context history of IBS associated with loose to frankly watery stools.  He states that the symptoms have been stable for years.  He states that he has GERD which is controlled by omeprazole .  The GERD is in the context of hiatal hernia which was diagnosed 3 or 4 decades ago.  He does have esophageal spasm with consumption of cold drinks..  Pertinent physical findings include: There is no oral pharyngeal erythema or dental erosions of significance.  First heart sound slightly increased.  Rales are noted over the lower posterior thorax ,greater on the right than the left.  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  There are 4 areas of resolving ecchymosis documented.  The largest was 17 cm vertically by 13 cm horizontally over the right posterior thorax superiorly.  On the left superior  thorax there was a small area  4 x 3.5 cm.  The right inferior thorax revealed an 11 x 4 cm area of ecchymosis.  There was also a 13 cm x 10 cm area of ecchymosis of the right hip.  There is no history of trauma  or known reason for the ecchymotic areas.  He denies any bleeding dyscrasias such as epistaxis, hemoptysis, hematuria, rectal bleeding, or melena.  He was unaware of the bruising and was informed of the one on his hip by his significant other.  Significant is the absence of any lymphadenopathy or splenomegaly on exam. All physical findings NCS except for the bruising which warrants further evaluation.  Med list was reviewed; there is no medication implicated.  He is on low-dose aspirin, 81 mg daily. He is not on a DOAC or other anticoagulant.  Labs will be reviewed with special focus on platelet count and presence of any anemia with associated indices change.                                                                                                                         Alinda Apley MD,SI  29/ May/ 2025 ; lab reports from 21/May  visit reviewed.  No anemia present, H/H 14.8/43.  Platelet count 219,000.  APTT 26.5 (21.9-29.4 sec); PT 11.0 (9.7-12.3 sec); INR 1.0 (0.8-1.2 ). No hematologic abnormalities found to explain bruising.  Patient was asked to monitor for any recurrence of bruising and assess for possible etiology.

## 2024-04-12 ENCOUNTER — Encounter: Payer: Self-pay | Admitting: Internal Medicine

## 2024-04-12 DIAGNOSIS — R233 Spontaneous ecchymoses: Secondary | ICD-10-CM | POA: Insufficient documentation

## 2024-04-12 NOTE — Assessment & Plan Note (Addendum)
 GI evaluation if symptoms recur or progress.  The original IBS symptoms of loose to frank watery stools are essentially stable.

## 2024-04-12 NOTE — Assessment & Plan Note (Signed)
 Labs will be reviewed to rule out etiologies such as thrombocytopenia.  Subject has been asked to monitor for any activities or events which might explain the ecchymoses.

## 2024-04-12 NOTE — Assessment & Plan Note (Signed)
 Cardiopulmonary symptoms are stable w/o progression or exacerbation.

## 2024-04-12 NOTE — Patient Instructions (Signed)
 As there is no clear etiology for the ecchymoses,:labs will be reviewed upon completion to rule out thrombocytopenia or other hematologic abnormal values.

## 2024-04-18 ENCOUNTER — Other Ambulatory Visit: Payer: Self-pay | Admitting: Cardiovascular Disease

## 2024-04-18 DIAGNOSIS — E785 Hyperlipidemia, unspecified: Secondary | ICD-10-CM

## 2024-04-19 ENCOUNTER — Encounter: Payer: Self-pay | Admitting: Cardiovascular Disease

## 2024-04-26 ENCOUNTER — Other Ambulatory Visit: Payer: Self-pay | Admitting: Cardiovascular Disease

## 2024-04-26 DIAGNOSIS — E785 Hyperlipidemia, unspecified: Secondary | ICD-10-CM

## 2024-04-27 ENCOUNTER — Telehealth: Payer: Self-pay | Admitting: Cardiovascular Disease

## 2024-04-27 NOTE — Telephone Encounter (Signed)
 Called pt advised that script for rosuvastatin  10 mg PO every day was sent to [facility] on [address]. Pt thanked me no further concerns at this time.

## 2024-04-27 NOTE — Telephone Encounter (Signed)
*  STAT* If patient is at the pharmacy, call can be transferred to refill team.   1. Which medications need to be refilled? (please list name of each medication and dose if known)  rosuvastatin  10 mg  2. Which pharmacy/location (including street and city if local pharmacy) is medication to be sent to? [id number] - [place], [place] - [address] Phone: [phone]  Fax: [phone]     3. Do they need a 30 day or 90 day supply? 90 (Per message on 5/29 pt is to restart this medication)

## 2024-05-09 ENCOUNTER — Telehealth: Payer: Self-pay | Admitting: Gastroenterology

## 2024-05-09 ENCOUNTER — Other Ambulatory Visit: Payer: Self-pay

## 2024-05-09 NOTE — Telephone Encounter (Signed)
 Procedure:Colonoscopy Procedure date: 05/20/24 Procedure location: [facility] Arrival Time: 6:00 am Spoke with the patient Y/N:   No, I left a detailed message 05/09/24 @ 9:03 am for the patient to return call    Any prep concerns? ___  Has the patient obtained the prep from the pharmacy ? ___ Do you have a care partner and transportation: ___ Any additional concerns? ___

## 2024-05-10 NOTE — Telephone Encounter (Signed)
 Patient returning call. Please advise

## 2024-05-11 ENCOUNTER — Other Ambulatory Visit: Payer: Self-pay

## 2024-05-11 ENCOUNTER — Other Ambulatory Visit: Payer: Self-pay | Admitting: Pharmacy Technician

## 2024-05-11 NOTE — Progress Notes (Signed)
 Attempted to reach pt. Over the phone 3 times.Message was left on voice message box.

## 2024-05-11 NOTE — Progress Notes (Signed)
 Specialty Pharmacy Refill Coordination Note  Vincent Meyers is a 78 y.o. male contacted today regarding refills of specialty medication(s) Pirfenidone    Patient requested Delivery   Delivery date: 05/16/24   Verified address: [address] [place] [address]   Medication will be filled on 05/15/24.

## 2024-05-15 ENCOUNTER — Other Ambulatory Visit: Payer: Self-pay

## 2024-05-16 NOTE — Telephone Encounter (Signed)
 Patient wanting to clarify if he needed to stop his low dose ASA prior to his procedure. Per the colonoscopy instructions he was given, he does not stop the ASA. Advised the patient this is correct. Thanks me for the call.

## 2024-05-16 NOTE — Anesthesia Preprocedure Evaluation (Signed)
 Anesthesia Evaluation  Patient identified by MRN, date of birth, ID band Patient awake    Reviewed: Allergy  & Precautions, NPO status , Patient's Chart, lab work & pertinent test results  History of Anesthesia Complications Negative for: history of anesthetic complications  Airway Mallampati: II  TM Distance: >3 FB Neck ROM: Full    Dental  (+) Dental Advisory Given   Pulmonary sleep apnea and Continuous Positive Airway Pressure Ventilation , COPD,  COPD inhaler and oxygen dependent, former smoker Pulm fibrosis   breath sounds clear to auscultation       Cardiovascular hypertension, Pt. on medications and Pt. on home beta blockers (-) angina + Peripheral Vascular Disease   Rhythm:Regular Rate:Normal  '24 ECHO: EF 65-70%, normal LVF, Grade 1 DD, normal RVF, no significant valvular abnormalities   Neuro/Psych   Anxiety     negative neurological ROS     GI/Hepatic Neg liver ROS,GERD  Medicated and Controlled,,  Endo/Other  BMI 33  Renal/GU negative Renal ROS     Musculoskeletal  (+) Arthritis ,    Abdominal   Peds  Hematology negative hematology ROS (+)   Anesthesia Other Findings   Reproductive/Obstetrics                             Anesthesia Physical Anesthesia Plan  ASA: 3  Anesthesia Plan: MAC   Post-op Pain Management: Minimal or no pain anticipated   Induction:   PONV Risk Score and Plan: 1 and Treatment may vary due to age or medical condition  Airway Management Planned: Natural Airway and Simple Face Mask  Additional Equipment: None  Intra-op Plan:   Post-operative Plan:   Informed Consent: I have reviewed the patients History and Physical, chart, labs and discussed the procedure including the risks, benefits and alternatives for the proposed anesthesia with the patient or authorized representative who has indicated his/her understanding and acceptance.     Dental  advisory given  Plan Discussed with: CRNA and Surgeon  Anesthesia Plan Comments:         Anesthesia Quick Evaluation

## 2024-05-16 NOTE — Telephone Encounter (Signed)
 Patient requesting f/u call in regards to hospital procedure. Please advise.   Thank you

## 2024-05-17 ENCOUNTER — Encounter (HOSPITAL_COMMUNITY)
Admission: RE | Disposition: A | Discharge status: Home / Self Care | Payer: Self-pay | Source: Home / Self Care | Attending: Gastroenterology

## 2024-05-17 ENCOUNTER — Ambulatory Visit (HOSPITAL_COMMUNITY)
Admission: RE | Admit: 2024-05-17 | Discharge: 2024-05-17 | Disposition: A | Discharge status: Home / Self Care | Attending: Gastroenterology | Admitting: Gastroenterology

## 2024-05-17 ENCOUNTER — Ambulatory Visit (HOSPITAL_COMMUNITY): Payer: Self-pay | Admitting: Registered Nurse

## 2024-05-17 ENCOUNTER — Encounter (HOSPITAL_COMMUNITY): Payer: Self-pay | Admitting: Gastroenterology

## 2024-05-17 ENCOUNTER — Other Ambulatory Visit: Payer: Self-pay | Admitting: Internal Medicine

## 2024-05-17 DIAGNOSIS — Z006 Encounter for examination for normal comparison and control in clinical research program: Secondary | ICD-10-CM

## 2024-05-17 DIAGNOSIS — I2510 Atherosclerotic heart disease of native coronary artery without angina pectoris: Secondary | ICD-10-CM | POA: Insufficient documentation

## 2024-05-17 DIAGNOSIS — Z9981 Dependence on supplemental oxygen: Secondary | ICD-10-CM | POA: Insufficient documentation

## 2024-05-17 DIAGNOSIS — Z860100 Personal history of colon polyps, unspecified: Secondary | ICD-10-CM

## 2024-05-17 DIAGNOSIS — Z1211 Encounter for screening for malignant neoplasm of colon: Secondary | ICD-10-CM

## 2024-05-17 DIAGNOSIS — K219 Gastro-esophageal reflux disease without esophagitis: Secondary | ICD-10-CM | POA: Diagnosis not present

## 2024-05-17 DIAGNOSIS — J449 Chronic obstructive pulmonary disease, unspecified: Secondary | ICD-10-CM | POA: Insufficient documentation

## 2024-05-17 DIAGNOSIS — Z980 Intestinal bypass and anastomosis status: Secondary | ICD-10-CM | POA: Insufficient documentation

## 2024-05-17 DIAGNOSIS — D122 Benign neoplasm of ascending colon: Secondary | ICD-10-CM

## 2024-05-17 DIAGNOSIS — G4733 Obstructive sleep apnea (adult) (pediatric): Secondary | ICD-10-CM | POA: Insufficient documentation

## 2024-05-17 DIAGNOSIS — K648 Other hemorrhoids: Secondary | ICD-10-CM | POA: Insufficient documentation

## 2024-05-17 DIAGNOSIS — K5730 Diverticulosis of large intestine without perforation or abscess without bleeding: Secondary | ICD-10-CM | POA: Insufficient documentation

## 2024-05-17 DIAGNOSIS — I10 Essential (primary) hypertension: Secondary | ICD-10-CM | POA: Diagnosis not present

## 2024-05-17 DIAGNOSIS — Z87891 Personal history of nicotine dependence: Secondary | ICD-10-CM | POA: Diagnosis not present

## 2024-05-17 DIAGNOSIS — Z09 Encounter for follow-up examination after completed treatment for conditions other than malignant neoplasm: Secondary | ICD-10-CM | POA: Insufficient documentation

## 2024-05-17 HISTORY — PX: COLONOSCOPY: SHX5424

## 2024-05-17 HISTORY — PX: HEMOSTASIS CLIP PLACEMENT: SHX6857

## 2024-05-17 HISTORY — PX: POLYPECTOMY: SHX149

## 2024-05-17 SURGERY — COLONOSCOPY
Anesthesia: Monitor Anesthesia Care

## 2024-05-17 MED ORDER — PROPOFOL 500 MG/50ML IV EMUL
INTRAVENOUS | Status: DC | PRN
  Administered 2024-05-17: 130 ug/kg/min via INTRAVENOUS
  Administered 2024-05-17: 10 mg via INTRAVENOUS
  Administered 2024-05-17: 20 mg via INTRAVENOUS

## 2024-05-17 MED ORDER — PROPOFOL 1000 MG/100ML IV EMUL
INTRAVENOUS | Status: AC
  Filled 2024-05-17: qty 100

## 2024-05-17 MED ORDER — PROPOFOL 500 MG/50ML IV EMUL
INTRAVENOUS | Status: AC
  Filled 2024-05-17: qty 50

## 2024-05-17 MED ORDER — EPHEDRINE SULFATE-NACL 50-0.9 MG/10ML-% IV SOSY
PREFILLED_SYRINGE | INTRAVENOUS | Status: DC | PRN
  Administered 2024-05-17 (×2): 5 mg via INTRAVENOUS

## 2024-05-17 MED ORDER — SODIUM CHLORIDE 0.9 % IV SOLN
INTRAVENOUS | Status: AC | PRN
  Administered 2024-05-17: 500 mL via INTRAMUSCULAR

## 2024-05-17 MED ORDER — PROPOFOL 500 MG/50ML IV EMUL: INTRAVENOUS | Status: DC | PRN

## 2024-05-17 MED ORDER — SODIUM CHLORIDE 0.9 % IV SOLN: INTRAVENOUS | Status: DC

## 2024-05-17 NOTE — Transfer of Care (Signed)
 Immediate Anesthesia Transfer of Care Note  Patient: Vincent Meyers  Procedure(s) Performed: COLONOSCOPY POLYPECTOMY, INTESTINE CONTROL OF HEMORRHAGE, GI TRACT, ENDOSCOPIC, BY CLIPPING OR OVERSEWING  Patient Location: PACU and Endoscopy Unit  Anesthesia Type:MAC  Level of Consciousness: awake, alert , oriented, and patient cooperative  Airway & Oxygen Therapy: Patient Spontanous Breathing and Patient connected to face mask oxygen  Post-op Assessment: Report given to RN, Post -op Vital signs reviewed and stable, and Patient moving all extremities  Post vital signs: Reviewed and stable  Last Vitals:  Vitals Value Taken Time  BP 107/51 05/17/24 08:12  Temp 36.3 C 05/17/24 08:12  Pulse 67 05/17/24 08:14  Resp 17 05/17/24 08:14  SpO2 99 % 05/17/24 08:14  Vitals shown include unfiled device data.  Last Pain:  Vitals:   05/17/24 0812  TempSrc: Temporal  PainSc:          Complications: No notable events documented.

## 2024-05-17 NOTE — Op Note (Signed)
 [facility] Patient Name: Vincent Meyers Procedure Date: 05/17/2024 MRN: [id number] Attending MD: Victory CROME. Legrand , MD, [id number] Date of Birth: 01/15/1946 CSN: [id number] Age: 78 Admit Type: Outpatient Procedure:                Colonoscopy Indications:              Surveillance: History of piecemeal removal adenoma                            on last colonoscopy (< 3 yrs)                           >15 mm ascending colon TVA removed by EMR March 2024 Providers:                Victory CROME. Legrand, MD, Willy Hummer, RN, Curtistine Bishop, Technician Referring MD:              Medicines:                Monitored Anesthesia Care Complications:            No immediate complications. Estimated Blood Loss:     Estimated blood loss: none. Procedure:                Pre-Anesthesia Assessment:                           - Prior to the procedure, a History and Physical                            was performed, and patient medications and                            allergies were reviewed. The patient's tolerance of                            previous anesthesia was also reviewed. The risks                            and benefits of the procedure and the sedation                            options and risks were discussed with the patient.                            All questions were answered, and informed consent                            was obtained. Prior Anticoagulants: The patient has                            taken no anticoagulant or antiplatelet agents. ASA  Grade Assessment: III - A patient with severe                            systemic disease. After reviewing the risks and                            benefits, the patient was deemed in satisfactory                            condition to undergo the procedure.                           After obtaining informed consent, the colonoscope                            was passed  under direct vision. Throughout the                            procedure, the patient's blood pressure, pulse, and                            oxygen saturations were monitored continuously. The                            CF-HQ190L ([id number]) Olympus colonoscope was                            introduced through the anus and advanced to the the                            cecum, identified by appendiceal orifice and                            ileocecal valve. The colonoscopy was performed                            without much difficulty (somewhat redundant). The                            patient tolerated the procedure well. The quality                            of the bowel preparation was good with additional                            lavage (some remaining scattered fibrous debris).                            The ileocecal valve, appendiceal orifice, and                            rectum were photographed. Scope In: 7:41:02 AM Scope Out: 8:03:18 AM Scope Withdrawal Time: 0 hours 19 minutes 56 seconds  Total Procedure Duration: 0 hours 22 minutes 16  seconds  Findings:      The perianal and digital rectal examinations were normal.      Repeat examination of right colon under NBI performed.      There was evidence of a prior end-to-end colo-rectal anastomosis in the       recto-sigmoid colon. This was patent and was characterized by healthy       appearing mucosa. Multiple diverticuli adjacent to this anastomosis.      A 12 mm polyp was found in the proximal ascending colon at the site of       prior polyp EMR. The polyp was sessile and on a mobile fold. The polyp       was removed with a hot snare. (Removed en bloc) resection and retrieval       were complete. Additional STSC applied to polypectomy edges. To prevent       bleeding post-intervention, one hemostatic clip was successfully placed       (MR conditional). Clip manufacturer: [employer].      Internal hemorrhoids were  found.      The exam was otherwise without abnormality on direct and retroflexion       views. Impression:               - Patent end-to-end colo-rectal anastomosis,                            characterized by healthy appearing mucosa.                           - One 12 mm polyp in the proximal ascending colon,                            removed with a hot snare. Resected and retrieved.                            Clip (MR conditional) was placed. Clip                            manufacturer: [employer].                           - Internal hemorrhoids.                           - The examination was otherwise normal on direct                            and retroflexion views. Moderate Sedation:      MAC sedation used Recommendation:           - Patient has a contact number available for                            emergencies. The signs and symptoms of potential                            delayed complications were discussed with the  patient. Return to normal activities tomorrow.                            Written discharge instructions were provided to the                            patient.                           - Resume previous diet.                           - Continue present medications.                           - Await pathology results.                           - Repeat colonoscopy in 1 year for surveillance. Procedure Code(s):        --- Professional ---                           [phone], Colonoscopy, flexible; with removal of                            tumor(s), polyp(s), or other lesion(s) by snare                            technique Diagnosis Code(s):        --- Professional ---                           Z86.010, Personal history of colonic polyps                           Z98.0, Intestinal bypass and anastomosis status                           D12.2, Benign neoplasm of ascending colon                           K64.8, Other  hemorrhoids CPT copyright 2022 American Medical Association. All rights reserved. The codes documented in this report are preliminary and upon coder review may  be revised to meet current compliance requirements. Rande Roylance L. Legrand, MD 05/17/2024 8:14:54 AM This report has been signed electronically. Number of Addenda: 0

## 2024-05-17 NOTE — Anesthesia Postprocedure Evaluation (Signed)
 Anesthesia Post Note  Patient: Vincent Meyers  Procedure(s) Performed: COLONOSCOPY POLYPECTOMY, INTESTINE CONTROL OF HEMORRHAGE, GI TRACT, ENDOSCOPIC, BY CLIPPING OR OVERSEWING     Patient location during evaluation: Endoscopy Anesthesia Type: MAC Level of consciousness: oriented, awake and alert, awake and patient cooperative Pain management: pain level controlled Vital Signs Assessment: post-procedure vital signs reviewed and stable Respiratory status: spontaneous breathing, patient connected to nasal cannula oxygen, nonlabored ventilation and respiratory function stable Cardiovascular status: blood pressure returned to baseline and stable Postop Assessment: no apparent nausea or vomiting and able to ambulate Anesthetic complications: no   No notable events documented.  Last Vitals:  Vitals:   05/17/24 0820 05/17/24 0830  BP: 108/62 134/63  Pulse: 66 63  Resp: (!) 22 17  Temp:    SpO2: 97% 97%    Last Pain:  Vitals:   05/17/24 0820  TempSrc:   PainSc: 0-No pain                 Julio Zappia,E. [place]

## 2024-05-17 NOTE — Discharge Instructions (Signed)

## 2024-05-17 NOTE — Anesthesia Procedure Notes (Signed)
 Procedure Name: MAC Date/Time: 05/17/2024 7:31 AM  Performed by: Memory Armida LABOR, CRNAPre-anesthesia Checklist: Patient identified, Emergency Drugs available, Suction available, Patient being monitored and Timeout performed Patient Re-evaluated:Patient Re-evaluated prior to induction Oxygen Delivery Method: Simple face mask Placement Confirmation: positive ETCO2 Dental Injury: Teeth and Oropharynx as per pre-operative assessment

## 2024-05-17 NOTE — Interval H&P Note (Signed)
 History and Physical Interval Note:  05/17/2024 7:28 AM  Vincent Meyers  has presented today for surgery, with the diagnosis of hx of colon polyps- Z86.0100.  The various methods of treatment have been discussed with the patient and family. After consideration of risks, benefits and other options for treatment, the patient has consented to  Procedure(s): COLONOSCOPY (N/A) as a surgical intervention.  The patient's history has been reviewed, patient examined, no change in status, stable for surgery.  I have reviewed the patient's chart and labs.  Questions were answered to the patient's satisfaction.     Victory LITTIE Brand III

## 2024-05-17 NOTE — H&P (Signed)
 History and Physical:  This patient presents for endoscopic testing for:   History of colon polyps   78 year old man known to me for history of colon polyps, most recently in March 2024 with 15 mm ascending colon TVA removed by EMR. He has chronic irregular bowel habits with intermittent diarrhea outlined in my most recent office consult note with no recent clinical changes in that.  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  Patient is otherwise without complaints or active issues today.   Past Medical History: Past Medical History:  Diagnosis Date   Acid reflux    Angina pectoris (HCC)    Anxiety    Arthritis    Coronary artery disease    Enlarged prostate    per [facility] new patient packet   Erectile dysfunction    High blood sugar    per [facility] new patient packet   Hyperlipidemia    Hypertension    IBS (irritable bowel syndrome)    ILD (interstitial lung disease) (HCC)    OSA (obstructive sleep apnea)    Pulmonary fibrosis (HCC)    per [facility] new patient packet     Past Surgical History: Past Surgical History:  Procedure Laterality Date   APPENDECTOMY     COLON SURGERY     perforated bowel and hernia repair   COLONOSCOPY  2024   per [facility] new patient packet   ILIAC ARTERY ANEURYSM REPAIR Left 2006   Dr. Oris (redo surgery)   REPLACEMENT TOTAL KNEE Left 2008   Dr.Yeats, per [facility] new patient packet   REPLACEMENT TOTAL KNEE Right 2018   Dr.J, per [facility] new patient packet   REPLACEMENT TOTAL KNEE BILATERAL     RIGHT HEART CATH N/A 09/09/2023   Procedure: RIGHT HEART CATH;  Surgeon: Rolan Ezra RAMAN, MD;  Location: [facility] INVASIVE CV LAB;  Service: Cardiovascular;  Laterality: N/A;   TONSILLECTOMY     age 66    Allergies: No Known Allergies  Outpatient Meds: Current Facility-Administered Medications  Medication Dose Route Frequency Provider Last Rate Last Admin   0.9 %  sodium chloride  infusion   Intravenous Continuous Danis, Victory CROME III,  MD       0.9 %  sodium chloride  infusion    Continuous PRN Legrand Victory CROME III, MD 10 mL/hr at 05/17/24 0717 Continued from Pre-op at 05/17/24 0717      ___________________________________________________________________ Objective   Exam:  BP (!) 164/66   Pulse 68   Temp 97.6 F (36.4 C) (Temporal)   Resp 16   Ht 5' 7.5 (1.715 m)   Wt 96.4 kg   SpO2 98%   BMI 32.79 kg/m   CV: regular , S1/S2 Resp: clear to auscultation bilaterally, normal RR and effort noted GI: soft, no tenderness, with active bowel sounds.   Assessment: History of colon polyps   Plan: Colonoscopy   The benefits and risks of the planned procedure(s) were described in detail with the patient or (when appropriate) their health care proxy.  Risks were outlined as including, but not limited to, bleeding, infection, perforation, adverse medication reaction leading to cardiac or pulmonary decompensation, pancreatitis (if ERCP).  The limitation of incomplete mucosal visualization was also discussed.  No guarantees or warranties were given.  The patient is appropriate for an endoscopic procedure in the ambulatory setting.   - Victory Legrand, MD

## 2024-05-18 LAB — SURGICAL PATHOLOGY

## 2024-05-19 ENCOUNTER — Encounter (HOSPITAL_COMMUNITY): Payer: Self-pay | Admitting: Gastroenterology

## 2024-05-21 ENCOUNTER — Other Ambulatory Visit: Payer: Self-pay

## 2024-05-23 ENCOUNTER — Encounter: Admitting: Internal Medicine

## 2024-05-23 ENCOUNTER — Encounter: Payer: Self-pay | Admitting: Internal Medicine

## 2024-05-23 DIAGNOSIS — Z006 Encounter for examination for normal comparison and control in clinical research program: Secondary | ICD-10-CM

## 2024-05-23 NOTE — Research (Signed)
 TITLE: A Phase 2, Randomized, Double-Blind, Placebo-Controlled Study to Evaluate the Safety and Efficacy of [id number] in Patients With Idiopathic Pulmonary Fibrosis  Protocol #: [id number] NCT: Sponsor:[employer]    [facility] [employer] Clinical Research Coordinator note:    This visit for Subject Vincent Meyers with DOB: [birth year] on 02/Jul/2025 for the above protocol is Visit/Encounter #  and is for purpose of research.    The consent for this encounter is under Protocol Version 6.0, Investigator Brochure Version 8.0, Consent Version modified [id number] and  is currently IRB approved.    Subject unable to complete spirometry and DLCO assessments for [facility] research study visit 5 as staff was not available on date of visit.  Assessments completed per protocol.  Subject reports no new or worsening problems or changes.        Signed by  Almarie Cress  Clinical Research Coordinator [facility]  [place], [place] 7:95 PM 06/15/2024

## 2024-05-24 ENCOUNTER — Encounter: Admitting: Internal Medicine

## 2024-05-24 NOTE — Telephone Encounter (Signed)
**Note De-identified  [place] Obfuscation** Please advise 

## 2024-05-24 NOTE — Telephone Encounter (Signed)
 I thought Almarie gave it to you but in any event  - here it iss   Take CAROB Flour for Diarrhea due to medication as follows Take 1 DESSERT spoon  size serving [approximately 7 g] before breakfast If still no response in 3 days then add another 7 g at dinner If still no response in 3 days then make it to spoon servings at breakfast and 2 spoon servings at dinner and hold NOTE: Always MIX the CAROB FLOUR with WATER or MILK or JUICE - MIGHT NEED A BLENDER to do it DO NOT EAT CAROB POWER DIRECTLY - it can choke or make you cough

## 2024-05-28 ENCOUNTER — Other Ambulatory Visit: Payer: Self-pay | Admitting: Nurse Practitioner

## 2024-05-30 ENCOUNTER — Ambulatory Visit: Payer: Self-pay | Admitting: Gastroenterology

## 2024-05-30 NOTE — Telephone Encounter (Signed)
 Letter mailed. Recall already placed by Daphne, RN

## 2024-06-03 ENCOUNTER — Other Ambulatory Visit (HOSPITAL_BASED_OUTPATIENT_CLINIC_OR_DEPARTMENT_OTHER): Payer: Self-pay | Admitting: Family Medicine

## 2024-06-03 DIAGNOSIS — I10 Essential (primary) hypertension: Secondary | ICD-10-CM

## 2024-06-05 ENCOUNTER — Ambulatory Visit (HOSPITAL_BASED_OUTPATIENT_CLINIC_OR_DEPARTMENT_OTHER)
Admission: RE | Admit: 2024-06-05 | Discharge: 2024-06-05 | Disposition: A | Discharge status: Home / Self Care | Payer: Self-pay | Source: Ambulatory Visit | Attending: Internal Medicine | Admitting: Internal Medicine

## 2024-06-05 DIAGNOSIS — R918 Other nonspecific abnormal finding of lung field: Secondary | ICD-10-CM | POA: Diagnosis not present

## 2024-06-05 DIAGNOSIS — J8489 Other specified interstitial pulmonary diseases: Secondary | ICD-10-CM | POA: Diagnosis not present

## 2024-06-05 DIAGNOSIS — Z006 Encounter for examination for normal comparison and control in clinical research program: Secondary | ICD-10-CM | POA: Insufficient documentation

## 2024-06-07 ENCOUNTER — Other Ambulatory Visit: Payer: Self-pay | Admitting: Nurse Practitioner

## 2024-06-07 DIAGNOSIS — I10 Essential (primary) hypertension: Secondary | ICD-10-CM

## 2024-06-07 MED ORDER — LISINOPRIL 40 MG PO TABS: 40.0000 mg | ORAL_TABLET | Freq: Every day | ORAL | 1 refills | Status: DC

## 2024-06-07 MED ORDER — ATENOLOL 50 MG PO TABS: 75.0000 mg | ORAL_TABLET | Freq: Every day | ORAL | 1 refills | Status: DC

## 2024-06-07 NOTE — Telephone Encounter (Signed)
 Copied from CRM [id number]. Topic: Clinical - Medication Refill >> Jun 07, 2024 10:19 AM Carmell R wrote: Medication:  lisinopril  (ZESTRIL ) 40 MG tablet atenolol  (TENORMIN ) 50 MG tablet  Has the patient contacted their pharmacy? Yes, Pharmacy says they sent a new request with current prescriber and still waiting.  This is the patient's preferred pharmacy:  [facility] [address] [place] - [address] [place] [address] Phone: [phone] Fax: [phone]  Is this the correct pharmacy for this prescription? Yes  Has the prescription been filled recently? Yes  Is the patient out of the medication? No, but has 2 days left of medication.  Has the patient been seen for an appointment in the last year OR does the patient have an upcoming appointment? Yes  Can we respond through MyChart? Yes  Agent: Please be advised that Rx refills may take up to 3 business days. We ask that you follow-up with your pharmacy.

## 2024-06-07 NOTE — Telephone Encounter (Signed)
 Medication has not been refilled by [place], Jessica K, NP / pts next appointment is 06/11/2024.

## 2024-06-08 ENCOUNTER — Other Ambulatory Visit: Payer: Self-pay

## 2024-06-08 ENCOUNTER — Other Ambulatory Visit: Payer: Self-pay | Admitting: Internal Medicine

## 2024-06-08 ENCOUNTER — Other Ambulatory Visit: Payer: Self-pay | Admitting: Pharmacy Technician

## 2024-06-08 DIAGNOSIS — J84112 Idiopathic pulmonary fibrosis: Secondary | ICD-10-CM

## 2024-06-08 MED ORDER — PIRFENIDONE 801 MG PO TABS
801.0000 mg | ORAL_TABLET | Freq: Three times a day (TID) | ORAL | 5 refills | Status: AC
  Filled 2024-06-08: qty 90, 30d supply, fill #0
  Filled 2024-07-04 – 2024-07-10 (×3): qty 90, 30d supply, fill #1
  Filled 2024-08-07: qty 90, 30d supply, fill #2
  Filled 2024-09-04: qty 90, 30d supply, fill #3
  Filled 2024-10-04 – 2024-10-08 (×2): qty 90, 30d supply, fill #4
  Filled 2024-11-06 – 2024-11-14 (×2): qty 90, 30d supply, fill #5
  Filled 2024-12-07: qty 90, 30d supply, fill #6

## 2024-06-08 NOTE — Progress Notes (Signed)
 Specialty Pharmacy Refill Coordination Note  Vincent Meyers is a 78 y.o. male contacted today regarding refills of specialty medication(s) Pirfenidone    Patient requested Delivery   Delivery date: 06/13/24   Verified address: [address] [place] [address]   Medication will be filled on 06/12/24.

## 2024-06-09 ENCOUNTER — Ambulatory Visit: Payer: Self-pay | Admitting: Internal Medicine

## 2024-06-09 NOTE — Progress Notes (Signed)
 IPF stable x 6 months

## 2024-06-11 ENCOUNTER — Other Ambulatory Visit: Payer: Self-pay

## 2024-06-11 ENCOUNTER — Encounter: Payer: Self-pay | Admitting: Nurse Practitioner

## 2024-06-11 ENCOUNTER — Ambulatory Visit (INDEPENDENT_AMBULATORY_CARE_PROVIDER_SITE_OTHER): Admitting: Nurse Practitioner

## 2024-06-11 VITALS — BP 132/78 | HR 76 | Temp 97.4°F | Resp 12 | Ht 67.0 in | Wt 205.8 lb

## 2024-06-11 DIAGNOSIS — I2510 Atherosclerotic heart disease of native coronary artery without angina pectoris: Secondary | ICD-10-CM | POA: Diagnosis not present

## 2024-06-11 DIAGNOSIS — R2689 Other abnormalities of gait and mobility: Secondary | ICD-10-CM

## 2024-06-11 DIAGNOSIS — I10 Essential (primary) hypertension: Secondary | ICD-10-CM | POA: Diagnosis not present

## 2024-06-11 DIAGNOSIS — J84112 Idiopathic pulmonary fibrosis: Secondary | ICD-10-CM

## 2024-06-11 DIAGNOSIS — R233 Spontaneous ecchymoses: Secondary | ICD-10-CM

## 2024-06-11 DIAGNOSIS — K580 Irritable bowel syndrome with diarrhea: Secondary | ICD-10-CM

## 2024-06-11 DIAGNOSIS — K219 Gastro-esophageal reflux disease without esophagitis: Secondary | ICD-10-CM

## 2024-06-11 DIAGNOSIS — E782 Mixed hyperlipidemia: Secondary | ICD-10-CM

## 2024-06-11 DIAGNOSIS — E1159 Type 2 diabetes mellitus with other circulatory complications: Secondary | ICD-10-CM | POA: Diagnosis not present

## 2024-06-11 NOTE — Patient Instructions (Signed)
 Please contact your local pharmacy to update Shingles Vaccine and Covid Vaccine.

## 2024-06-11 NOTE — Progress Notes (Signed)
 Careteam: Patient Care Team: [name], NP as PCP - General (Geriatric Medicine) Francyne Headland, MD as PCP - Cardiology (Cardiology) Legrand Victory LITTIE MOULD, MD as Consulting Physician (Gastroenterology) Roseann, Adine PARAS., MD as Referring Physician (Urology) Geronimo Amel, MD as Consulting Physician (Pulmonary Disease) Karis Daniel Motts, MD as Referring Physician (Otolaryngology)  PLACE OF SERVICE:  [facility]  Advanced Directive information    No Known Allergies  Chief Complaint  Patient presents with   Medical Management of Chronic Issues    3 month follow     HPI:  Discussed the use of AI scribe software for clinical note transcription with the patient, who gave verbal consent to proceed.  History of Present Illness Vincent Meyers is a 78 year old male with pulmonary fibrosis who presents for a three-month follow-up.  He is participating in a clinical trial for pulmonary fibrosis, with the last visit scheduled for this Friday. He has been seeing the pulmonologist monthly, alternating between two doctors. After adjusting the timing of his doses with meals, he experienced no issues with the trial medication, which initially caused nausea and vomiting. He stopped the medication a couple of weeks ago and will have one more follow-up visit.  He underwent a colonoscopy since the last visit, which revealed a polyp. He has a history of irritable bowel syndrome and reports ongoing diarrhea. He uses carob bean powder every other day, which he reports helps with the diarrhea.  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 His girlfriend monitors his bruising, and he reports that it has healed. He notes that he bruises easily, especially on his arms, due to minor bumps.  He uses a CPAP machine at night.  Reports he experienced a brief episode of  dizziness and a headache while standing at the kitchen counter, which lasted about 30 seconds, with the headache lingering for 15 minutes. He has a family history of strokes and is cautious about his caffeine and sodium intake. He drinks beet juice and hibiscus tea regularly.  He mentions occasional balance issues over the past couple of years, though he has not fallen. He remains active, working out at [employer] and maintaining his oxygen levels well during exercise. He has not pursued physical therapy but is aware of exercises to improve balance and strength.  He is concerned about his A1c levels, which were previously at 7.1, and has been trying to adjust his diet, including reducing honey intake. He is interested in rechecking his A1c to monitor his progress.    Review of Systems:  Review of Systems  Constitutional:  Negative for chills, fever and weight loss.  HENT:  Negative for tinnitus.   Respiratory:  Negative for cough, sputum production and shortness of breath.   Cardiovascular:  Negative for chest pain, palpitations and leg swelling.  Gastrointestinal:  Negative for abdominal pain, constipation, diarrhea and heartburn.  Genitourinary:  Negative for dysuria, frequency and urgency.  Musculoskeletal:  Negative for back pain, falls, joint pain and myalgias.  Skin: Negative.   Neurological:  Negative for dizziness and headaches.  Psychiatric/Behavioral:  Negative for depression and memory loss. The patient does not have insomnia.     Past Medical History:  Diagnosis Date   Acid reflux    Angina pectoris (HCC)    Anxiety    Arthritis  Coronary artery disease    Enlarged prostate    per [facility] new patient packet   Erectile dysfunction    High blood sugar    per [facility] new patient packet   Hyperlipidemia    Hypertension    IBS (irritable bowel syndrome)    ILD (interstitial lung disease) (HCC)    OSA (obstructive sleep apnea)    Pulmonary fibrosis (HCC)    per [facility] new patient  packet   Past Surgical History:  Procedure Laterality Date   APPENDECTOMY     COLON SURGERY     perforated bowel and hernia repair   COLONOSCOPY  2024   per [facility] new patient packet   COLONOSCOPY N/A 05/17/2024   Procedure: COLONOSCOPY;  Surgeon: Legrand Victory LITTIE DOUGLAS, MD;  Location: [facility];  Service: Gastroenterology;  Laterality: N/A;   HEMOSTASIS CLIP PLACEMENT  05/17/2024   Procedure: CONTROL OF HEMORRHAGE, GI TRACT, ENDOSCOPIC, BY CLIPPING OR OVERSEWING;  Surgeon: Legrand Victory LITTIE DOUGLAS, MD;  Location: [facility];  Service: Gastroenterology;;   ILIAC ARTERY ANEURYSM REPAIR Left 2006   Dr. Oris (redo surgery)   POLYPECTOMY  05/17/2024   Procedure: POLYPECTOMY, INTESTINE;  Surgeon: Legrand Victory LITTIE DOUGLAS, MD;  Location: [facility];  Service: Gastroenterology;;   REPLACEMENT TOTAL KNEE Left 2008   Dr.Yeats, per [facility] new patient packet   REPLACEMENT TOTAL KNEE Right 2018   Dr.J, per [facility] new patient packet   REPLACEMENT TOTAL KNEE BILATERAL     RIGHT HEART CATH N/A 09/09/2023   Procedure: RIGHT HEART CATH;  Surgeon: Rolan Ezra RAMAN, MD;  Location: [facility] INVASIVE CV LAB;  Service: Cardiovascular;  Laterality: N/A;   TONSILLECTOMY     age 60   Social History:   reports that he quit smoking about 36 years ago. His smoking use included cigarettes. He started smoking about 59 years ago. He has a 46 pack-year smoking history. He has been exposed to tobacco smoke. He has never used smokeless tobacco. He reports that he does not currently use alcohol after a past usage of about 4.0 - 5.0 standard drinks of alcohol per week. He reports that he does not use drugs.  Family History  Problem Relation Age of Onset   Parkinson's disease Mother    Heart attack Father    Stroke Father    Alzheimer's disease Father    Alzheimer's disease Sister    Other Sister        degenerative muscle disease   Hypertension Daughter    Obesity Daughter    Heart murmur Daughter    Colon cancer Neg Hx    Esophageal  cancer Neg Hx    Rectal cancer Neg Hx    Stomach cancer Neg Hx     Medications: Patient's Medications  New Prescriptions   No medications on file  Previous Medications   AMLODIPINE  (NORVASC ) 5 MG TABLET    TAKE 1 TABLET (5 MG TOTAL) BY MOUTH DAILY.   ASPIRIN EC 81 MG TABLET    Take 81 mg by mouth daily.   ATENOLOL  (TENORMIN ) 50 MG TABLET    Take 1.5 tablets (75 mg total) by mouth daily.   COENZYME Q10 (COQ10 PO)    Take 500 mg by mouth daily.   FIBER PO    Take 4 capsules by mouth 2 (two) times daily.   FLAXSEED, LINSEED, (FLAXSEED OIL PO)    Take 1 capsule by mouth daily.   FLUTICASONE  (FLONASE ) 50 MCG/ACT NASAL SPRAY    Place  2 sprays into both nostrils daily.   HYDROCHLOROTHIAZIDE  (HYDRODIURIL ) 25 MG TABLET    TAKE 1 TABLET BY MOUTH EVERY DAY   LISINOPRIL  (ZESTRIL ) 40 MG TABLET    Take 1 tablet (40 mg total) by mouth daily.   LORATADINE (CLARITIN) 10 MG TABLET    Take 10 mg by mouth daily.   MAGNESIUM-POTASSIUM PO    Take 1 tablet by mouth daily. With zinc   OMEPRAZOLE  (PRILOSEC) 20 MG CAPSULE    Take 1 capsule (20 mg total) by mouth daily.   PIRFENIDONE  801 MG TABS    Take 1 tablet (801 mg total) by mouth 3 (three) times daily with meals.   ROSUVASTATIN  (CRESTOR ) 10 MG TABLET    TAKE 1 TABLET BY MOUTH EVERY DAY   TADALAFIL  (CIALIS ) 20 MG TABLET    Take 1 tablet (20 mg total) by mouth daily as needed.   TURMERIC-GINGER PO    Take 2,600 mg by mouth daily.   UNABLE TO FIND    Med Name: Taking experimental drug or possible placeb for lungs, managed by [employer] with [place]  Modified Medications   No medications on file  Discontinued Medications   ATORVASTATIN  (LIPITOR) 20 MG TABLET    Take 1 tablet (20 mg total) by mouth daily.    Physical Exam:  Vitals:   06/11/24 1036  BP: 132/78  Pulse: 76  Resp: 12  Temp: (!) 97.4 F (36.3 C)  SpO2: 92%  Weight: 205 lb 12.8 oz (93.4 kg)  Height: 5' 7 (1.702 m)   Body mass index is 32.23 kg/m. Wt Readings from Last 3  Encounters:  06/11/24 205 lb 12.8 oz (93.4 kg)  05/17/24 212 lb 8.4 oz (96.4 kg)  03/02/24 212 lb 9.6 oz (96.4 kg)    Physical Exam Constitutional:      General: He is not in acute distress.    Appearance: He is well-developed. He is not diaphoretic.  HENT:     Head: Normocephalic and atraumatic.     Right Ear: External ear normal.     Left Ear: External ear normal.     Mouth/Throat:     Pharynx: No oropharyngeal exudate.  Eyes:     Conjunctiva/sclera: Conjunctivae normal.     Pupils: Pupils are equal, round, and reactive to light.  Cardiovascular:     Rate and Rhythm: Normal rate and regular rhythm.     Heart sounds: Normal heart sounds.  Pulmonary:     Effort: Pulmonary effort is normal.     Breath sounds: Normal breath sounds.  Abdominal:     General: Bowel sounds are normal.     Palpations: Abdomen is soft.  Musculoskeletal:        General: No tenderness.     Cervical back: Normal range of motion and neck supple.     Right lower leg: No edema.     Left lower leg: No edema.  Skin:    General: Skin is warm and dry.  Neurological:     Mental Status: He is alert and oriented to person, place, and time.     Labs reviewed: Basic Metabolic Panel: Recent Labs    10/31/23 1504 03/02/24 1519 06/11/24 1459  NA 134 133* 133*  K 4.2 4.3 4.1  CL 93* 94* 97*  CO2 26 30 30   GLUCOSE 107* 196* 142*  BUN 10 11 8   CREATININE 0.56* 0.48* 0.50*  CALCIUM  9.8 9.3 9.4   Liver Function Tests: Recent Labs  03/02/24 1519 06/11/24 1459  AST 23 23  ALT 22 19  BILITOT 0.5 0.5  PROT 6.9 7.0   No results for input(s): LIPASE, AMYLASE in the last 8760 hours. No results for input(s): AMMONIA in the last 8760 hours. CBC: Recent Labs    09/09/23 1124 09/09/23 1144 09/09/23 1318 09/09/23 1319 03/02/24 1519  WBC 10.5  --   --   --  10.7  NEUTROABS  --   --   --   --  7,886*  HGB 14.7   < > 15.6 15.6 14.7  HCT 44.1   < > 46.0 46.0 44.2  MCV 81.2  --   --   --  82.9   PLT 169  --   --   --  196   < > = values in this interval not displayed.   Lipid Panel: Recent Labs    01/18/24 0948 03/02/24 1519  CHOL 123 118  HDL 32* 36*  LDLCALC 70 56  TRIG 116 191*  CHOLHDL 3.8 3.3   TSH: No results for input(s): TSH in the last 8760 hours. A1C: Lab Results  Component Value Date   HGBA1C 6.6 (H) 06/11/2024     Assessment/Plan  Spontaneous ecchymoses Assessment & Plan: Has improved at this time without significant ecchymoses noted.    Essential hypertension Assessment & Plan: Blood pressure well controlled, goal bp <140/90 Continue current medications and dietary modifications follow metabolic panel    Irritable bowel syndrome with diarrhea Assessment & Plan: Symptoms are controlled at this time, continues fiber supplement.    Gastroesophageal reflux disease without esophagitis Assessment & Plan: Stable on omeprazole .    IPF (idiopathic pulmonary fibrosis) (HCC) Assessment & Plan: Ongoing and stable on current regimen, continues to be in clinical trail and monitored by pulmonary.    Type 2 diabetes mellitus with other circulatory complication, without long-term current use of insulin (HCC) Assessment & Plan: Dietary modifications only at this time Encouraged dietary compliance, routine foot care/monitoring and to keep up with diabetic eye exams through ophthalmology   Orders: -     Hemoglobin A1c -     Comprehensive metabolic panel with GFR  Coronary artery disease involving native coronary artery of native heart without angina pectoris Assessment & Plan: Without chest pains, continues on asa and crestor    Imbalance Assessment & Plan: Ongoing, continues exercises, does not wish to do PT at this time, discussed balance exercises/class such as tai-chi.    Hyperlipemia, mixed Assessment & Plan: Continues on crestor  10 mg daily, no noted side effects today      Return in about 4 months (around 10/12/2024) for  routine follow up.  Vincent Meyers K. Caro BODILY [facility] [phone]

## 2024-06-12 ENCOUNTER — Ambulatory Visit: Payer: Self-pay | Admitting: Nurse Practitioner

## 2024-06-12 DIAGNOSIS — R2689 Other abnormalities of gait and mobility: Secondary | ICD-10-CM | POA: Insufficient documentation

## 2024-06-12 DIAGNOSIS — J84112 Idiopathic pulmonary fibrosis: Secondary | ICD-10-CM | POA: Insufficient documentation

## 2024-06-12 LAB — COMPREHENSIVE METABOLIC PANEL WITH GFR
AG Ratio: 1.7 (calc) (ref 1.0–2.5)
ALT: 19 U/L (ref 9–46)
AST: 23 U/L (ref 10–35)
Albumin: 4.4 g/dL (ref 3.6–5.1)
Alkaline phosphatase (APISO): 62 U/L (ref 35–144)
BUN/Creatinine Ratio: 16 (calc) (ref 6–22)
BUN: 8 mg/dL (ref 7–25)
CO2: 30 mmol/L (ref 20–32)
Calcium: 9.4 mg/dL (ref 8.6–10.3)
Chloride: 97 mmol/L — ABNORMAL LOW (ref 98–110)
Creat: 0.5 mg/dL — ABNORMAL LOW (ref 0.70–1.28)
Globulin: 2.6 g/dL (ref 1.9–3.7)
Glucose, Bld: 142 mg/dL — ABNORMAL HIGH (ref 65–99)
Potassium: 4.1 mmol/L (ref 3.5–5.3)
Sodium: 133 mmol/L — ABNORMAL LOW (ref 135–146)
Total Bilirubin: 0.5 mg/dL (ref 0.2–1.2)
Total Protein: 7 g/dL (ref 6.1–8.1)
eGFR: 106 mL/min/1.73m2 (ref >=60)

## 2024-06-12 LAB — HEMOGLOBIN A1C
Hgb A1c MFr Bld: 6.6 % — ABNORMAL HIGH (ref <5.7)
Mean Plasma Glucose: 143 mg/dL
eAG (mmol/L): 7.9 mmol/L

## 2024-06-12 NOTE — Assessment & Plan Note (Signed)
 Dietary modifications only at this time Encouraged dietary compliance, routine foot care/monitoring and to keep up with diabetic eye exams through ophthalmology

## 2024-06-12 NOTE — Assessment & Plan Note (Signed)
 Ongoing and stable on current regimen, continues to be in clinical trail and monitored by pulmonary.

## 2024-06-12 NOTE — Assessment & Plan Note (Signed)
 Blood pressure well controlled, goal bp <140/90 Continue current medications and dietary modifications follow metabolic panel

## 2024-06-12 NOTE — Assessment & Plan Note (Signed)
 Ongoing, continues exercises, does not wish to do PT at this time, discussed balance exercises/class such as tai-chi.

## 2024-06-12 NOTE — Assessment & Plan Note (Signed)
 Without chest pains, continues on asa and crestor 

## 2024-06-12 NOTE — Assessment & Plan Note (Signed)
 Symptoms are controlled at this time, continues fiber supplement.

## 2024-06-12 NOTE — Assessment & Plan Note (Signed)
 Has improved at this time without significant ecchymoses noted.

## 2024-06-12 NOTE — Assessment & Plan Note (Signed)
 Stable on omeprazole.

## 2024-06-12 NOTE — Assessment & Plan Note (Signed)
 Continues on crestor  10 mg daily, no noted side effects today

## 2024-06-15 ENCOUNTER — Encounter

## 2024-06-15 ENCOUNTER — Encounter: Admitting: Internal Medicine

## 2024-06-15 DIAGNOSIS — Z006 Encounter for examination for normal comparison and control in clinical research program: Secondary | ICD-10-CM

## 2024-07-04 ENCOUNTER — Other Ambulatory Visit: Payer: Self-pay

## 2024-07-06 ENCOUNTER — Other Ambulatory Visit: Payer: Self-pay

## 2024-07-10 ENCOUNTER — Other Ambulatory Visit: Payer: Self-pay

## 2024-07-10 NOTE — Progress Notes (Signed)
 Specialty Pharmacy Refill Coordination Note  Vincent Meyers is a 78 y.o. male contacted today regarding refills of specialty medication(s) Pirfenidone    Patient requested Delivery   Delivery date: 07/16/24   Verified address: [address] [place] [address]   Medication will be filled on 07/13/24.

## 2024-07-12 ENCOUNTER — Other Ambulatory Visit: Payer: Self-pay

## 2024-07-13 ENCOUNTER — Other Ambulatory Visit: Payer: Self-pay

## 2024-08-07 ENCOUNTER — Other Ambulatory Visit (HOSPITAL_COMMUNITY): Payer: Self-pay

## 2024-08-07 ENCOUNTER — Other Ambulatory Visit: Payer: Self-pay

## 2024-08-08 ENCOUNTER — Other Ambulatory Visit (HOSPITAL_COMMUNITY): Payer: Self-pay

## 2024-08-09 ENCOUNTER — Other Ambulatory Visit: Payer: Self-pay | Admitting: Pharmacy Technician

## 2024-08-09 ENCOUNTER — Other Ambulatory Visit: Payer: Self-pay

## 2024-08-09 NOTE — Progress Notes (Signed)
 Specialty Pharmacy Refill Coordination Note  Vincent Meyers is a 78 y.o. male contacted today regarding refills of specialty medication(s) Pirfenidone    Patient requested Delivery   Delivery date: 08/10/24   Verified address: [address] [place]   Medication will be filled on 08/09/24.

## 2024-08-15 NOTE — Progress Notes (Signed)
 MAREON ROBINETTE                                          MRN: [id number]   08/15/2024   The [facility] Quality Team Specialist reviewed this patient medical record for the purposes of chart review for care gap closure. The following were reviewed: abstraction for care gap closure-controlling blood pressure.    [facility] Quality Team

## 2024-08-22 ENCOUNTER — Encounter (HOSPITAL_COMMUNITY): Payer: Self-pay | Admitting: Emergency Medicine

## 2024-08-22 ENCOUNTER — Emergency Department (HOSPITAL_COMMUNITY)

## 2024-08-22 ENCOUNTER — Emergency Department (HOSPITAL_COMMUNITY)
Admission: EM | Admit: 2024-08-22 | Discharge: 2024-08-22 | Disposition: A | Discharge status: Home / Self Care | Attending: Emergency Medicine | Admitting: Emergency Medicine

## 2024-08-22 ENCOUNTER — Other Ambulatory Visit: Payer: Self-pay | Admitting: Nurse Practitioner

## 2024-08-22 ENCOUNTER — Other Ambulatory Visit: Payer: Self-pay

## 2024-08-22 DIAGNOSIS — R0989 Other specified symptoms and signs involving the circulatory and respiratory systems: Secondary | ICD-10-CM | POA: Diagnosis not present

## 2024-08-22 DIAGNOSIS — I2510 Atherosclerotic heart disease of native coronary artery without angina pectoris: Secondary | ICD-10-CM | POA: Insufficient documentation

## 2024-08-22 DIAGNOSIS — Z79899 Other long term (current) drug therapy: Secondary | ICD-10-CM | POA: Insufficient documentation

## 2024-08-22 DIAGNOSIS — Z7982 Long term (current) use of aspirin: Secondary | ICD-10-CM | POA: Insufficient documentation

## 2024-08-22 DIAGNOSIS — R0602 Shortness of breath: Secondary | ICD-10-CM | POA: Diagnosis not present

## 2024-08-22 DIAGNOSIS — K219 Gastro-esophageal reflux disease without esophagitis: Secondary | ICD-10-CM

## 2024-08-22 DIAGNOSIS — R071 Chest pain on breathing: Secondary | ICD-10-CM | POA: Insufficient documentation

## 2024-08-22 DIAGNOSIS — R0789 Other chest pain: Secondary | ICD-10-CM | POA: Diagnosis not present

## 2024-08-22 DIAGNOSIS — I10 Essential (primary) hypertension: Secondary | ICD-10-CM | POA: Diagnosis not present

## 2024-08-22 DIAGNOSIS — E119 Type 2 diabetes mellitus without complications: Secondary | ICD-10-CM | POA: Insufficient documentation

## 2024-08-22 DIAGNOSIS — R079 Chest pain, unspecified: Secondary | ICD-10-CM | POA: Diagnosis not present

## 2024-08-22 DIAGNOSIS — J449 Chronic obstructive pulmonary disease, unspecified: Secondary | ICD-10-CM | POA: Diagnosis not present

## 2024-08-22 DIAGNOSIS — R590 Localized enlarged lymph nodes: Secondary | ICD-10-CM | POA: Diagnosis not present

## 2024-08-22 DIAGNOSIS — R918 Other nonspecific abnormal finding of lung field: Secondary | ICD-10-CM | POA: Diagnosis not present

## 2024-08-22 LAB — CBC
HCT: 45.8 % (ref 39.0–52.0)
Hemoglobin: 15 g/dL (ref 13.0–17.0)
MCH: 27.1 pg (ref 26.0–34.0)
MCHC: 32.8 g/dL (ref 30.0–36.0)
MCV: 82.8 fL (ref 80.0–100.0)
Platelets: 173 K/uL (ref 150–400)
RBC: 5.53 MIL/uL (ref 4.22–5.81)
RDW: 13.3 % (ref 11.5–15.5)
WBC: 12.8 K/uL — ABNORMAL HIGH (ref 4.0–10.5)
nRBC: 0 % (ref 0.0–0.2)

## 2024-08-22 LAB — BASIC METABOLIC PANEL WITH GFR
Anion gap: 9 (ref 5–15)
BUN: 7 mg/dL — ABNORMAL LOW (ref 8–23)
CO2: 27 mmol/L (ref 22–32)
Calcium: 8.8 mg/dL — ABNORMAL LOW (ref 8.9–10.3)
Chloride: 95 mmol/L — ABNORMAL LOW (ref 98–111)
Creatinine, Ser: 0.61 mg/dL (ref 0.61–1.24)
GFR, Estimated: >60 mL/min (ref >60)
Glucose, Bld: 180 mg/dL — ABNORMAL HIGH (ref 70–99)
Potassium: 4.1 mmol/L (ref 3.5–5.1)
Sodium: 131 mmol/L — ABNORMAL LOW (ref 135–145)

## 2024-08-22 LAB — TROPONIN I (HIGH SENSITIVITY)
Troponin I (High Sensitivity): 5 ng/L (ref <18)
Troponin I (High Sensitivity): 5 ng/L (ref <18)

## 2024-08-22 MED ORDER — IOHEXOL 350 MG/ML SOLN
75.0000 mL | Freq: Once | INTRAVENOUS | Status: AC | PRN
  Administered 2024-08-22: 75 mL via INTRAVENOUS

## 2024-08-22 MED ORDER — NAPROXEN 500 MG PO TABS
500.0000 mg | ORAL_TABLET | Freq: Two times a day (BID) | ORAL | 0 refills | Status: AC
Start: 2024-08-22 — End: ?

## 2024-08-22 NOTE — ED Triage Notes (Signed)
 Patient here with R sided pain located under the R axilla, right side chest down to the lower abdomen since yesterday. States it is sharp and feels as though he fell down some stairs, however denies any trauma. Reports increased SHOB, pain making it hard for patient to breath. Hx of pulmonary fibrosis wears 2 L of O2 chronically however on 3 L on arrival. Denies n.v.d.

## 2024-08-22 NOTE — Discharge Instructions (Addendum)
 You are seen today for right sided chest pain and shortness of breath.  Your lab work, imaging and physical exam were all reassuring that have low suspicion for any emergent cause your symptoms today.  Suspect this pain is likely musculoskeletal pain causing you to take shallower breaths which is leading to your shortness of breath.  Will recommend he continue to follow-up with your PCP as well as with your pulmonologist within the next week.  Recommend that you take Tylenol  and I am also sending in some anti-inflammatories for you to use as needed to help with the pain and thus helping you take fuller breaths. Take Tylenol  (acetominophen)  650mg  every 4-6 hours, as needed for pain or fever. Do not take more than 4,000 mg in a 24-hour period. As this may cause liver damage. While this is rare, if you begin to develop yellowing of the skin or eyes, stop taking and return to ER immediately.   Please take Naprosyn, 500mg  by mouth twice daily as needed for pain - this in an antiinflammatory medicine (NSAID) and is similar to ibuprofen - many people feel that it is stronger than ibuprofen and it is easier to take since it is a smaller pill.  Please use this only for 1 week - if your pain persists, you will need to follow up with your doctor in the office for ongoing guidance and pain control.     Return to the ED that if you have any new or worsening symptoms which would include worsening chest pain, fever, worsening shortness of breath, persistent vomiting, change in sputum color.

## 2024-08-22 NOTE — ED Provider Notes (Signed)
 [facility] AT [facility] Provider Note   CSN: [id number] Arrival date & time: 08/22/24  9567     Patient presents with: Chest Pain   Vincent Meyers is a 78 y.o. male.   Chest Pain Associated symptoms: shortness of breath   Patient is a 78 year old male seen in the ED today for acute right-sided chest pain that feels like a broken rib but states that he has not had any falls or trauma to the area.  Worse with inspiration, not tender to palpation.  Notes that this has been accompanied with shortness of breath needing increase in his baseline O2 from 2 L to 3 L.  Notes that he is having symptoms that are worse on exertion, worse from baseline.  Notes that he was lifting more yesterday and that this might be the cause of his symptoms today.  But was concerned about blood clot.  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  No previous history of blood clots.  Denies fever, headache, sputum change, odynophagia, dysphagia, unilateral weakness, abdominal pain, nausea, vomiting, diarrhea, dysuria, lower leg swelling.      Prior to Admission medications   Medication Sig Start Date End Date Taking? Authorizing Provider  naproxen (NAPROSYN) 500 MG tablet Take 1 tablet (500 mg total) by mouth 2 (two) times daily. 08/22/24  Yes Joshuajames Moehring S, PA-C  amLODipine  (NORVASC ) 5 MG tablet TAKE 1 TABLET (5 MG TOTAL) BY MOUTH DAILY. 05/28/24 05/23/25  [name], NP  aspirin EC 81 MG tablet Take 81 mg by mouth daily.    [provider]  atenolol  (TENORMIN ) 50 MG tablet Take 1.5 tablets (75 mg total) by mouth daily. 06/07/24   Fargo, Amy E, NP  Coenzyme Q10 (COQ10 PO) Take 500 mg by mouth daily.    [provider]  FIBER PO Take 4 capsules by mouth 2 (two) times daily.    [provider]  Flaxseed, Linseed, (FLAXSEED  OIL PO) Take 1 capsule by mouth daily.    [provider]  fluticasone  (FLONASE ) 50 MCG/ACT nasal spray Place 2 sprays into both nostrils daily. 12/27/22   [provider]  hydrochlorothiazide  (HYDRODIURIL ) 25 MG tablet TAKE 1 TABLET BY MOUTH EVERY DAY 04/04/24   Eubanks, Jessica K, NP  lisinopril  (ZESTRIL ) 40 MG tablet Take 1 tablet (40 mg total) by mouth daily. 06/07/24   Fargo, Amy E, NP  loratadine (CLARITIN) 10 MG tablet Take 10 mg by mouth daily.    [provider]  MAGNESIUM-POTASSIUM PO Take 1 tablet by mouth daily. With zinc Patient not taking: Reported on 06/11/2024    [provider]  omeprazole  (PRILOSEC) 20 MG capsule TAKE 1 CAPSULE BY MOUTH EVERY DAY 08/22/24   Eubanks, Jessica K, NP  Pirfenidone  801 MG TABS Take 1 tablet (801 mg total) by mouth 3 (three) times daily with meals. 06/08/24   Geronimo Amel, MD  rosuvastatin  (CRESTOR ) 10 MG tablet TAKE 1 TABLET BY MOUTH EVERY DAY 04/27/24   Croitoru, Mihai, MD  tadalafil  (CIALIS ) 20 MG tablet Take 1 tablet (20 mg total) by mouth daily as needed. 08/22/23   Stoneking, Adine PARAS., MD  TURMERIC-GINGER PO Take 2,600 mg by mouth daily.    [provider]  UNABLE TO FIND Med Name: Taking experimental drug or possible placeb for lungs, managed by [employer] [facility] with [place]  [provider]    Allergies: Patient has no known allergies.    Review of Systems  Respiratory:  Positive for shortness of breath.   Cardiovascular:  Positive for chest pain.  All other systems reviewed and are negative.   Updated Vital Signs BP (!) 158/68 (BP Location: Right Arm)   Pulse 79   Temp 98 F (36.7 C) (Oral)   Resp 18   Ht 5' 7 (1.702 m)   Wt 93 kg   SpO2 100%   BMI 32.11 kg/m   Physical Exam Vitals and nursing note reviewed.  Constitutional:      General: He is not in acute distress.    Appearance: Normal appearance. He is not ill-appearing or diaphoretic.  HENT:     Head: Normocephalic  and atraumatic.  Eyes:     General: No scleral icterus.       Right eye: No discharge.        Left eye: No discharge.     Extraocular Movements: Extraocular movements intact.     Conjunctiva/sclera: Conjunctivae normal.  Neck:     Vascular: No JVD.  Cardiovascular:     Rate and Rhythm: Normal rate and regular rhythm.     Pulses: Normal pulses.     Heart sounds: Normal heart sounds. No murmur heard.    No friction rub. No gallop.  Pulmonary:     Effort: Pulmonary effort is normal. No respiratory distress.     Breath sounds: No stridor. Examination of the right-upper field reveals decreased breath sounds. Examination of the left-upper field reveals decreased breath sounds. Examination of the right-middle field reveals decreased breath sounds. Examination of the left-middle field reveals decreased breath sounds. Examination of the right-lower field reveals decreased breath sounds. Examination of the left-lower field reveals decreased breath sounds. Decreased breath sounds present. No wheezing, rhonchi or rales.  Chest:     Chest wall: No tenderness.  Abdominal:     General: Abdomen is flat. A surgical scar is present. There is no distension.     Palpations: Abdomen is soft.     Tenderness: There is no abdominal tenderness. There is no right CVA tenderness, left CVA tenderness, guarding or rebound.  Musculoskeletal:        General: No swelling, deformity or signs of injury.     Cervical back: Normal range of motion. No rigidity.     Right lower leg: No edema.     Left lower leg: No edema.  Skin:    General: Skin is warm and dry.     Findings: No bruising, erythema or lesion.  Neurological:     General: No focal deficit present.     Mental Status: He is alert and oriented to person, place, and time. Mental status is at baseline.     Sensory: No sensory deficit.     Motor: No weakness.  Psychiatric:        Mood and Affect: Mood normal.     (all labs ordered are listed, but only  abnormal results are displayed) Labs Reviewed  BASIC METABOLIC PANEL WITH GFR - Abnormal; Notable for the following components:      Result Value   Sodium 131 (*)    Chloride 95 (*)    Glucose, Bld 180 (*)    BUN 7 (*)    Calcium  8.8 (*)    All other components within normal limits  CBC - Abnormal; Notable for the following components:   WBC 12.8 (*)  All other components within normal limits  TROPONIN I (HIGH SENSITIVITY)  TROPONIN I (HIGH SENSITIVITY)    EKG: None  Radiology: CT Angio Chest PE W and/or Wo Contrast Result Date: 08/22/2024 CLINICAL DATA:  Acute right-sided chest pain. EXAM: CT ANGIOGRAPHY CHEST WITH CONTRAST TECHNIQUE: Multidetector CT imaging of the chest was performed using the standard protocol during bolus administration of intravenous contrast. Multiplanar CT image reconstructions and MIPs were obtained to evaluate the vascular anatomy. RADIATION DOSE REDUCTION: This exam was performed according to the departmental dose-optimization program which includes automated exposure control, adjustment of the mA and/or kV according to patient size and/or use of iterative reconstruction technique. CONTRAST:  75mL OMNIPAQUE  IOHEXOL  350 MG/ML SOLN COMPARISON:  June 05, 2024. FINDINGS: Cardiovascular: Satisfactory opacification of the pulmonary arteries to the segmental level. No evidence of pulmonary embolism. Normal heart size. No pericardial effusion. Coronary artery calcifications are noted suggesting coronary artery disease. Atherosclerosis of thoracic aorta is noted without aneurysm or dissection. Mediastinum/Nodes: Stable mildly enlarged mediastinal adenopathy is noted which most likely is reactive in etiology. Thyroid gland is unremarkable. Esophagus is unremarkable. Lungs/Pleura: No pneumothorax or pleural effusion is noted. 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  This includes large bulla formation in right lower lobe. Upper Abdomen: No acute abnormality. Musculoskeletal: No chest wall abnormality. No acute or significant osseous findings. Review of the MIP images confirms the above findings. IMPRESSION: 1. No definite evidence of pulmonary embolus. 2. Coronary artery calcifications are noted suggesting coronary artery disease. 3. 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 4. Aortic atherosclerosis. Aortic Atherosclerosis (ICD10-I70.0). Electronically Signed   By: Lynwood Landy Raddle M.D.   On: 08/22/2024 08:21   DG Chest 2 View Result Date: 08/22/2024 CLINICAL DATA:  Chest pain EXAM: CHEST - 2 VIEW COMPARISON:  01/31/2022 FINDINGS: Low lung volumes. The cardio pericardial silhouette is enlarged. Vascular congestion noted with diffuse interstitial opacity, compatible with the patient's known underlying chronic interstitial lung disease. Component of superimposed pulmonary edema not excluded. Dominant air cyst in the lateral right lung similar to CT scan of 06/05/2024. small right pleural effusion suspected. No worrisome lytic or sclerotic osseous abnormality. IMPRESSION: Low volume film with vascular congestion and diffuse interstitial opacity, compatible with the patient's known underlying chronic interstitial lung disease. Component of superimposed pulmonary edema not excluded. Electronically Signed   By: Camellia Candle M.D.   On: 08/22/2024 05:08   Procedures   Medications Ordered in the ED  iohexol  (OMNIPAQUE ) 350 MG/ML injection 75 mL (75 mLs Intravenous Contrast Given 08/22/24 0809)     Medical Decision Making  This patient is a 79 year old male who presents to the ED for concern of worsening shortness of breath requiring increase in his baseline O2 from 2 L to 3 L accompanied with acute onset right-sided pleuritic chest pain.  Notes to have not had any  change in sputum color, no fever.  Had not felt this way before, with no clot history.  On physical exam, patient is in no acute distress, afebrile, alert and orient x 4, speaking in full sentences, nontachycardic.  Mildly tachypneic with respiratory rate 22 on examination satting at 100% on 3 L.  Noted to have diminished lung sounds bilaterally with no wheezes or rhonchi or rales noted.  RRR.  No lower leg edema.  [place]' sign negative bilaterally.  No chest wall tenderness to palpation.  No rashes noted.  Unremarkable exam otherwise.  Will CTA chest with current lab work showing a mildly elevated white count.  Suspect PE versus COPD exacerbation versus worsening pulmonary fibrosis.  Troponin is 5, CT angio is unremarkable for any new findings.  Suspect likely musculoskeletal pain as patient because of this that he noted that he had been taking shallow breaths secondary to the pain.  Suspecting hypoventilation as cause of the patient's shortness of breath.  Will send home with anti-inflammatory and follow-up with PCP for reevaluation of his pulmonary fibrosis, shortness of breath.  Patient vital signs have remained stable throughout the course of patient's time in the ED. Low suspicion for any other emergent pathology at this time. I believe this patient is safe to be discharged. Provided strict return to ER precautions. Patient expressed agreement and understanding of plan. All questions were answered.   Differential diagnoses prior to evaluation: The emergent differential diagnosis includes, but is not limited to, ACS, AAS, Pulmonary Embolism, Tension Pneumothorax, Esophageal Rupture, Cardiac Tamponade, Pericarditis, Myocarditis, Pneumothorax, Pneumonia, Aortic Stenosis, CHF Exacerbation, GERD,  Esophageal Spasm,  Mallory-Weiss, Costochondritis, Musculoskeletal Chest Wall Pain, Anxiety / Panic Attack. This is not an exhaustive differential.   Past Medical History / Co-morbidities / Social  History: Sensorineural hearing loss, PAD, diabetes, HLD, HTN, CAD, COPD, pulmonary fibrosis requiring supplemental O2, IBS, chronic left shoulder pain  Additional history: Chart reviewed. Pertinent results include:   Last seen by PCP on 06/11/2024  Lab Tests/Imaging studies: I personally interpreted labs/imaging and the pertinent results include:   CBC notes a elevated white count of 12.8 but otherwise unremarkable BMP notes an hyponatremia 131 otherwise unremarkable Troponin 5  Chest ray shows low volume with vascular congestion and diffuse interstitial opacity compatible with patient's chronic interstitial lung disease.  Possible superimposed pulmonary congestion  CT angio PE study did not show any acute findings, noting similar findings as x-ray with chronic active show lung disease.  I agree with the radiologist interpretation.  Cardiac monitoring: EKG obtained and interpreted by myself and attending physician which shows:   Sinus rhythm with PVC complexes with incomplete right bundle branch block   Medications:  I have reviewed the patients home medicines and have made adjustments as needed.  Critical Interventions: None  Social Determinants of Health: Has good follow-up with PCP  Disposition: After consideration of the diagnostic results and the patients response to treatment, I feel that the patient would benefit from discharge and treatment as above.   emergency department workup does not suggest an emergent condition requiring admission or immediate intervention beyond what has been performed at this time. The plan is: Follow-up with PCP and pulmonology, anti-inflammatories as needed to help with pain and having him continue to take full breaths, return to ED for any new or worsening symptoms. The patient is safe for discharge and has been instructed to return immediately for worsening symptoms, change in symptoms or any other concerns.   Final diagnoses:  Chest pain on  breathing  Shortness of breath    ED Discharge Orders          Ordered    naproxen (NAPROSYN) 500 MG tablet  2 times daily        08/22/24 0912               Beola Terrall RAMAN, PA-C 08/22/24 0915    Armenta Canning, MD 08/23/24 1651

## 2024-08-29 ENCOUNTER — Emergency Department (HOSPITAL_COMMUNITY)

## 2024-08-29 ENCOUNTER — Inpatient Hospital Stay (HOSPITAL_COMMUNITY)

## 2024-08-29 ENCOUNTER — Inpatient Hospital Stay (HOSPITAL_COMMUNITY)
Admission: EM | Admit: 2024-08-29 | Discharge: 2024-09-07 | DRG: 199 | Disposition: A | Discharge status: Home / Self Care | Attending: Internal Medicine | Admitting: Internal Medicine

## 2024-08-29 DIAGNOSIS — J9383 Other pneumothorax: Principal | ICD-10-CM | POA: Diagnosis present

## 2024-08-29 DIAGNOSIS — J449 Chronic obstructive pulmonary disease, unspecified: Secondary | ICD-10-CM | POA: Diagnosis not present

## 2024-08-29 DIAGNOSIS — H903 Sensorineural hearing loss, bilateral: Secondary | ICD-10-CM | POA: Diagnosis present

## 2024-08-29 DIAGNOSIS — E871 Hypo-osmolality and hyponatremia: Secondary | ICD-10-CM | POA: Diagnosis present

## 2024-08-29 DIAGNOSIS — E782 Mixed hyperlipidemia: Secondary | ICD-10-CM | POA: Diagnosis not present

## 2024-08-29 DIAGNOSIS — Z860100 Personal history of colon polyps, unspecified: Secondary | ICD-10-CM

## 2024-08-29 DIAGNOSIS — J9621 Acute and chronic respiratory failure with hypoxia: Secondary | ICD-10-CM | POA: Diagnosis not present

## 2024-08-29 DIAGNOSIS — Z6831 Body mass index (BMI) 31.0-31.9, adult: Secondary | ICD-10-CM | POA: Diagnosis not present

## 2024-08-29 DIAGNOSIS — Z7982 Long term (current) use of aspirin: Secondary | ICD-10-CM

## 2024-08-29 DIAGNOSIS — I10 Essential (primary) hypertension: Secondary | ICD-10-CM | POA: Diagnosis not present

## 2024-08-29 DIAGNOSIS — Z9981 Dependence on supplemental oxygen: Secondary | ICD-10-CM | POA: Diagnosis not present

## 2024-08-29 DIAGNOSIS — J9601 Acute respiratory failure with hypoxia: Principal | ICD-10-CM

## 2024-08-29 DIAGNOSIS — G4733 Obstructive sleep apnea (adult) (pediatric): Secondary | ICD-10-CM | POA: Diagnosis not present

## 2024-08-29 DIAGNOSIS — I444 Left anterior fascicular block: Secondary | ICD-10-CM | POA: Diagnosis present

## 2024-08-29 DIAGNOSIS — Z823 Family history of stroke: Secondary | ICD-10-CM

## 2024-08-29 DIAGNOSIS — J90 Pleural effusion, not elsewhere classified: Secondary | ICD-10-CM | POA: Diagnosis not present

## 2024-08-29 DIAGNOSIS — I517 Cardiomegaly: Secondary | ICD-10-CM | POA: Diagnosis not present

## 2024-08-29 DIAGNOSIS — J982 Interstitial emphysema: Secondary | ICD-10-CM | POA: Diagnosis not present

## 2024-08-29 DIAGNOSIS — J479 Bronchiectasis, uncomplicated: Secondary | ICD-10-CM | POA: Diagnosis not present

## 2024-08-29 DIAGNOSIS — Z4682 Encounter for fitting and adjustment of non-vascular catheter: Secondary | ICD-10-CM | POA: Diagnosis not present

## 2024-08-29 DIAGNOSIS — R918 Other nonspecific abnormal finding of lung field: Secondary | ICD-10-CM | POA: Diagnosis not present

## 2024-08-29 DIAGNOSIS — Z820 Family history of epilepsy and other diseases of the nervous system: Secondary | ICD-10-CM

## 2024-08-29 DIAGNOSIS — I2510 Atherosclerotic heart disease of native coronary artery without angina pectoris: Secondary | ICD-10-CM | POA: Diagnosis present

## 2024-08-29 DIAGNOSIS — R54 Age-related physical debility: Secondary | ICD-10-CM | POA: Diagnosis present

## 2024-08-29 DIAGNOSIS — E1165 Type 2 diabetes mellitus with hyperglycemia: Secondary | ICD-10-CM | POA: Diagnosis present

## 2024-08-29 DIAGNOSIS — E66811 Obesity, class 1: Secondary | ICD-10-CM

## 2024-08-29 DIAGNOSIS — E861 Hypovolemia: Secondary | ICD-10-CM | POA: Diagnosis present

## 2024-08-29 DIAGNOSIS — R9389 Abnormal findings on diagnostic imaging of other specified body structures: Secondary | ICD-10-CM | POA: Diagnosis not present

## 2024-08-29 DIAGNOSIS — J849 Interstitial pulmonary disease, unspecified: Secondary | ICD-10-CM | POA: Diagnosis present

## 2024-08-29 DIAGNOSIS — Z23 Encounter for immunization: Secondary | ICD-10-CM | POA: Diagnosis not present

## 2024-08-29 DIAGNOSIS — Z87891 Personal history of nicotine dependence: Secondary | ICD-10-CM

## 2024-08-29 DIAGNOSIS — J939 Pneumothorax, unspecified: Secondary | ICD-10-CM | POA: Diagnosis not present

## 2024-08-29 DIAGNOSIS — N400 Enlarged prostate without lower urinary tract symptoms: Secondary | ICD-10-CM | POA: Diagnosis present

## 2024-08-29 DIAGNOSIS — J439 Emphysema, unspecified: Secondary | ICD-10-CM | POA: Diagnosis not present

## 2024-08-29 DIAGNOSIS — Z96653 Presence of artificial knee joint, bilateral: Secondary | ICD-10-CM | POA: Diagnosis present

## 2024-08-29 DIAGNOSIS — I739 Peripheral vascular disease, unspecified: Secondary | ICD-10-CM | POA: Diagnosis present

## 2024-08-29 DIAGNOSIS — Z1152 Encounter for screening for COVID-19: Secondary | ICD-10-CM | POA: Diagnosis not present

## 2024-08-29 DIAGNOSIS — Z79899 Other long term (current) drug therapy: Secondary | ICD-10-CM

## 2024-08-29 DIAGNOSIS — J95812 Postprocedural air leak: Secondary | ICD-10-CM | POA: Diagnosis not present

## 2024-08-29 DIAGNOSIS — Z8249 Family history of ischemic heart disease and other diseases of the circulatory system: Secondary | ICD-10-CM

## 2024-08-29 DIAGNOSIS — R0989 Other specified symptoms and signs involving the circulatory and respiratory systems: Secondary | ICD-10-CM | POA: Diagnosis not present

## 2024-08-29 DIAGNOSIS — J984 Other disorders of lung: Secondary | ICD-10-CM | POA: Diagnosis not present

## 2024-08-29 DIAGNOSIS — E1151 Type 2 diabetes mellitus with diabetic peripheral angiopathy without gangrene: Secondary | ICD-10-CM | POA: Diagnosis present

## 2024-08-29 DIAGNOSIS — J8410 Pulmonary fibrosis, unspecified: Secondary | ICD-10-CM | POA: Diagnosis not present

## 2024-08-29 DIAGNOSIS — R0602 Shortness of breath: Secondary | ICD-10-CM | POA: Diagnosis not present

## 2024-08-29 DIAGNOSIS — K219 Gastro-esophageal reflux disease without esophagitis: Secondary | ICD-10-CM | POA: Diagnosis not present

## 2024-08-29 DIAGNOSIS — Z8709 Personal history of other diseases of the respiratory system: Secondary | ICD-10-CM

## 2024-08-29 LAB — BASIC METABOLIC PANEL WITH GFR
Anion gap: 12 (ref 5–15)
BUN: 8 mg/dL (ref 8–23)
CO2: 25 mmol/L (ref 22–32)
Calcium: 8.8 mg/dL — ABNORMAL LOW (ref 8.9–10.3)
Chloride: 93 mmol/L — ABNORMAL LOW (ref 98–111)
Creatinine, Ser: 0.53 mg/dL — ABNORMAL LOW (ref 0.61–1.24)
GFR, Estimated: >60 mL/min (ref >60)
Glucose, Bld: 235 mg/dL — ABNORMAL HIGH (ref 70–99)
Potassium: 4.3 mmol/L (ref 3.5–5.1)
Sodium: 130 mmol/L — ABNORMAL LOW (ref 135–145)

## 2024-08-29 LAB — CBC
HCT: 47.5 % (ref 39.0–52.0)
Hemoglobin: 15.4 g/dL (ref 13.0–17.0)
MCH: 26.6 pg (ref 26.0–34.0)
MCHC: 32.4 g/dL (ref 30.0–36.0)
MCV: 82.2 fL (ref 80.0–100.0)
Platelets: 205 K/uL (ref 150–400)
RBC: 5.78 MIL/uL (ref 4.22–5.81)
RDW: 13.1 % (ref 11.5–15.5)
WBC: 12.7 K/uL — ABNORMAL HIGH (ref 4.0–10.5)
nRBC: 0 % (ref 0.0–0.2)

## 2024-08-29 LAB — TROPONIN I (HIGH SENSITIVITY)
Troponin I (High Sensitivity): 7 ng/L (ref <18)
Troponin I (High Sensitivity): 8 ng/L (ref <18)

## 2024-08-29 LAB — RESP PANEL BY RT-PCR (RSV, FLU A&B, COVID)  RVPGX2
Influenza A by PCR: NEGATIVE
Influenza B by PCR: NEGATIVE
Resp Syncytial Virus by PCR: NEGATIVE
SARS Coronavirus 2 by RT PCR: NEGATIVE

## 2024-08-29 MED ORDER — ALBUTEROL SULFATE (2.5 MG/3ML) 0.083% IN NEBU: 2.5000 mg | INHALATION_SOLUTION | RESPIRATORY_TRACT | Status: DC | PRN

## 2024-08-29 MED ORDER — ENOXAPARIN SODIUM 40 MG/0.4ML IJ SOSY: 40.0000 mg | PREFILLED_SYRINGE | INTRAMUSCULAR | Status: DC

## 2024-08-29 MED ORDER — FLUTICASONE PROPIONATE 50 MCG/ACT NA SUSP
2.0000 | Freq: Every day | NASAL | Status: DC
  Administered 2024-08-31 – 2024-09-07 (×7): 2 via NASAL
  Filled 2024-08-29: qty 16

## 2024-08-29 MED ORDER — LIDOCAINE HCL 2 % IJ SOLN
INTRAMUSCULAR | Status: AC
  Filled 2024-08-29: qty 20

## 2024-08-29 MED ORDER — SENNOSIDES-DOCUSATE SODIUM 8.6-50 MG PO TABS
1.0000 | ORAL_TABLET | Freq: Every evening | ORAL | Status: DC | PRN
  Administered 2024-09-01: 1 via ORAL
  Filled 2024-08-29: qty 1

## 2024-08-29 MED ORDER — ACETAMINOPHEN 325 MG PO TABS
975.0000 mg | ORAL_TABLET | Freq: Four times a day (QID) | ORAL | Status: DC | PRN
  Administered 2024-08-29: 975 mg via ORAL
  Filled 2024-08-29: qty 3

## 2024-08-29 MED ORDER — SODIUM CHLORIDE 0.9% FLUSH
10.0000 mL | Freq: Three times a day (TID) | INTRAVENOUS | Status: DC
  Administered 2024-08-30 – 2024-09-07 (×23): 10 mL via INTRAPLEURAL

## 2024-08-29 MED ORDER — LORATADINE 10 MG PO TABS
10.0000 mg | ORAL_TABLET | Freq: Every day | ORAL | Status: AC
Start: 2024-08-30 — End: ?
  Administered 2024-08-30 – 2024-09-07 (×5): 10 mg via ORAL
  Filled 2024-08-29 (×9): qty 1

## 2024-08-29 MED ORDER — ACETAMINOPHEN 325 MG PO TABS: 650.0000 mg | ORAL_TABLET | Freq: Four times a day (QID) | ORAL | Status: DC | PRN

## 2024-08-29 MED ORDER — OXYCODONE HCL 5 MG PO TABS
5.0000 mg | ORAL_TABLET | ORAL | Status: DC | PRN
  Administered 2024-08-29 – 2024-09-02 (×2): 5 mg via ORAL
  Filled 2024-08-29 (×3): qty 1

## 2024-08-29 MED ORDER — ASPIRIN 81 MG PO TBEC
81.0000 mg | DELAYED_RELEASE_TABLET | Freq: Every day | ORAL | Status: DC
  Administered 2024-08-30 – 2024-09-07 (×9): 81 mg via ORAL
  Filled 2024-08-29 (×9): qty 1

## 2024-08-29 MED ORDER — OXYCODONE HCL 5 MG PO TABS
5.0000 mg | ORAL_TABLET | Freq: Four times a day (QID) | ORAL | Status: DC | PRN
  Filled 2024-08-29: qty 1

## 2024-08-29 MED ORDER — ROSUVASTATIN CALCIUM 5 MG PO TABS
10.0000 mg | ORAL_TABLET | Freq: Every day | ORAL | Status: DC
  Administered 2024-08-29 – 2024-09-07 (×10): 10 mg via ORAL
  Filled 2024-08-29 (×10): qty 2

## 2024-08-29 MED ORDER — AMLODIPINE BESYLATE 5 MG PO TABS
5.0000 mg | ORAL_TABLET | Freq: Every day | ORAL | Status: DC
  Administered 2024-08-29 – 2024-09-07 (×10): 5 mg via ORAL
  Filled 2024-08-29 (×10): qty 1

## 2024-08-29 MED ORDER — HYDROCHLOROTHIAZIDE 25 MG PO TABS
25.0000 mg | ORAL_TABLET | Freq: Every day | ORAL | Status: DC
  Administered 2024-08-29 – 2024-09-01 (×4): 25 mg via ORAL
  Filled 2024-08-29 (×4): qty 1

## 2024-08-29 MED ORDER — PANTOPRAZOLE SODIUM 40 MG PO TBEC
40.0000 mg | DELAYED_RELEASE_TABLET | Freq: Every day | ORAL | Status: DC
  Administered 2024-08-29 – 2024-09-07 (×10): 40 mg via ORAL
  Filled 2024-08-29 (×10): qty 1

## 2024-08-29 MED ORDER — LISINOPRIL 20 MG PO TABS
40.0000 mg | ORAL_TABLET | Freq: Every day | ORAL | Status: DC
  Administered 2024-08-29 – 2024-09-07 (×10): 40 mg via ORAL
  Filled 2024-08-29 (×10): qty 2

## 2024-08-29 MED ORDER — LEVALBUTEROL HCL 0.63 MG/3ML IN NEBU
0.6300 mg | INHALATION_SOLUTION | Freq: Four times a day (QID) | RESPIRATORY_TRACT | Status: DC
  Administered 2024-08-29 – 2024-09-01 (×7): 0.63 mg via RESPIRATORY_TRACT
  Filled 2024-08-29 (×9): qty 3

## 2024-08-29 MED ORDER — PIRFENIDONE 801 MG PO TABS
801.0000 mg | ORAL_TABLET | Freq: Three times a day (TID) | ORAL | Status: DC
  Administered 2024-08-30: 801 mg via ORAL

## 2024-08-29 MED ORDER — ATENOLOL 50 MG PO TABS
75.0000 mg | ORAL_TABLET | Freq: Every day | ORAL | Status: DC
  Administered 2024-08-30 – 2024-09-07 (×9): 75 mg via ORAL
  Filled 2024-08-29 (×9): qty 1

## 2024-08-29 MED ORDER — HEPARIN SODIUM (PORCINE) 5000 UNIT/ML IJ SOLN
5000.0000 [IU] | Freq: Three times a day (TID) | INTRAMUSCULAR | Status: DC
  Administered 2024-08-30 – 2024-09-07 (×22): 5000 [IU] via SUBCUTANEOUS
  Filled 2024-08-29 (×22): qty 1

## 2024-08-29 MED ORDER — LIDOCAINE HCL (PF) 1 % IJ SOLN
INTRAMUSCULAR | Status: AC
  Administered 2024-08-29: 10 mL
  Filled 2024-08-29: qty 15

## 2024-08-29 NOTE — ED Notes (Signed)
 Pt transferred to hospital bed, c/o right side pain, MD messaged for medication.

## 2024-08-29 NOTE — Consult Note (Signed)
 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  Initial chest x-ray was completed showing large right pneumothorax and chronic interstitial lung disease changes.  Right chest tube was placed by ED staff.  TRH consulted for admission and PCCM consulted for assistance in chest tube management.   Upon initial assessment of patient within ED, patient hemodynamically stable with some hypertension notable.  Patient on 4 L nasal cannula with O2 sats 94 to 95%, respiratory rate low 20s. Patient reports that had similar pain/discomfort over a week ago but went away. Patient noticed chest pain and eventually SOB development after heavy lifting yesterday afternoon. SOB progressively got worse with exertion. Patient denies dizziness, vertigo, syncope, hemoptysis, fevers, chills, sick contacts. Patient endorses slight increase in cough after incident began to occur.  Upon assessing the [place] drain system of the chest tube-noticeable airleak was evaluated from and upon assessing chest tube site, chest tube was completely out chest.  Immediately called for stat chest x-ray to come to be completed, and placement of split gauze dressing.  Repeat chest x-ray showing significant pneumothorax on right side.  Discussed with patient and wife about emergent placement of second chest tube.  Chest tube placed more anteriorly. Right lung re-expansion noted.       Pertinent  Medical History   Past Medical  History:  Diagnosis Date   Acid reflux    Angina pectoris    Anxiety    Arthritis    Coronary artery disease    Enlarged prostate    per [facility] new patient packet   Erectile dysfunction    High blood sugar    per [facility] new patient packet   Hyperlipidemia    Hypertension    IBS (irritable bowel syndrome)    ILD (interstitial lung disease) (HCC)    OSA (obstructive sleep apnea)    Pulmonary fibrosis (HCC)    per [facility] new patient packet     Significant Hospital Events: Including procedures, antibiotic start and stop dates in addition to other pertinent events   10/8 Pulm Consult for Right Pneumo, Hx of ILD   Interim History / Subjective:  Patient stable, on 4 LNC. Chest tube placed in ED- out upon assessment- sutures not secure  -Post Chest X-ray showing pneumothorax  Objective    Blood pressure (!) 160/81, pulse 81, temperature 97.6 F (36.4 C), temperature source Oral, resp. rate 19, SpO2 94%.       No intake or output data in the 24 hours ending 08/29/24 1537 There were no vitals filed for this visit.  Examination: General: acute on chronic pleasant adult male, sitting up in ED stretcher, in NAD HEENT: Normocephalic, PERRLA intact, missing teeth, poor dentition, wears glasses  CV: s1,s2, RRR, no MRG, No JVD  pulm: clear, diminished, no distress on 4 LNC, right chest tube dislodged- significant airleak  Abs: bs active, soft  Extremities: no edema, no deformity, moves all extremities on command  Skin: no rash  Neuro: Rass 0, alert oriented x 4  GU: deferred   Resolved problem list   Assessment and Plan  Spontaneous pneumothorax secondary to ILD -MD Ramaswamy outpatient for ILD/pulmonary fibrosis -Per patient, has been on 2 L of nasal cannula chronically for approximately 2 years History of COPD/OSA on CPAP Chronically on 2 L nasal cannula - Patient had an original right chest tube placed for large pneumothorax, however chest tube not secured effectively, requiring  second emergent placement of right chest tube.  P: Place on -20 cm of suction, currently no airleak, continue to assess for the development of airleak  Hold off CPAP for now Hold off incentive spirometer as well Continue weaning oxygen down back to baseline, O2 sat goal greater than 88% Repeat chest x-ray in a.m., if stable, and no development of airleak-will consider and transitioning to waterseal Follow chest tube order set protocol   Other problems managed by primary: Hypertension Hyperlipidemia Anxiety Diabetes type 2 CAD Acid reflux   Labs   CBC: Recent Labs  Lab 08/29/24 1000  WBC 12.7*  HGB 15.4  HCT 47.5  MCV 82.2  PLT 205    Basic Metabolic Panel: Recent Labs  Lab 08/29/24 1000  NA 130*  K 4.3  CL 93*  CO2 25  GLUCOSE 235*  BUN 8  CREATININE 0.53*  CALCIUM  8.8*   GFR: Estimated Creatinine Clearance: 84.1 mL/min (A) (by C-G formula based on SCr of 0.53 mg/dL (L)). Recent Labs  Lab 08/29/24 1000  WBC 12.7*    Liver Function Tests: No results for input(s): AST, ALT, ALKPHOS, BILITOT, PROT, ALBUMIN in the last 168 hours. No results for input(s): LIPASE, AMYLASE in the last 168 hours. No results for input(s): AMMONIA in the last 168 hours.  ABG    Component Value Date/Time   HCO3 28.0 09/09/2023 1319   TCO2 29 09/09/2023 1319   O2SAT 76 09/09/2023 1319     Coagulation Profile: No results for input(s): INR, PROTIME in the last 168 hours.  Cardiac Enzymes: No results for input(s): CKTOTAL, CKMB, CKMBINDEX, TROPONINI in the last 168 hours.  HbA1C: Hgb A1c MFr Bld  Date/Time Value Ref Range Status  06/11/2024 02:59 PM 6.6 (H) <5.7 % Final    Comment:    For someone without known diabetes, a hemoglobin A1c value of 6.5% or greater indicates that they may have  diabetes and this should be confirmed with a follow-up  test. . For someone with known diabetes, a value <7% indicates  that their diabetes is well  controlled and a value  greater than or equal to 7% indicates suboptimal  control. A1c targets should be individualized based on  duration of diabetes, age, comorbid conditions, and  other considerations. . Currently, no consensus exists regarding use of hemoglobin A1c for diagnosis of diabetes for children. SABRA   03/02/2024 03:19 PM 7.1 (H) <5.7 % of total Hgb Final    Comment:    For someone without known diabetes, a hemoglobin A1c value of 6.5% or greater indicates that they may have  diabetes and this should be confirmed with a follow-up  test. . For someone with known diabetes, a value <7% indicates  that their diabetes is well controlled and a value  greater than or equal to 7% indicates suboptimal  control. A1c targets should be individualized based on  duration of diabetes, age, comorbid conditions, and  other considerations. . Currently, no consensus exists regarding use of hemoglobin A1c for diagnosis of diabetes for children. .     CBG: No results  for input(s): GLUCAP in the last 168 hours.  Review of Systems:   Review of Systems  Constitutional: Negative.   HENT: Negative.    Eyes: Negative.   Respiratory:  Positive for cough and shortness of breath.   Cardiovascular: Negative.   Gastrointestinal: Negative.   Genitourinary: Negative.   Musculoskeletal: Negative.   Skin: Negative.   Neurological: Negative.   Endo/Heme/Allergies: Negative.   Psychiatric/Behavioral: Negative.       Past Medical History:  He,  has a past medical history of Acid reflux, Angina pectoris, Anxiety, Arthritis, Coronary artery disease, Enlarged prostate, Erectile dysfunction, High blood sugar, Hyperlipidemia, Hypertension, IBS (irritable bowel syndrome), ILD (interstitial lung disease) (HCC), OSA (obstructive sleep apnea), and Pulmonary fibrosis (HCC).   Surgical History:   Past Surgical History:  Procedure Laterality Date   APPENDECTOMY     COLON SURGERY     perforated  bowel and hernia repair   COLONOSCOPY  2024   per [facility] new patient packet   COLONOSCOPY N/A 05/17/2024   Procedure: COLONOSCOPY;  Surgeon: Legrand Victory LITTIE DOUGLAS, MD;  Location: [facility] END[facility];  Service: Gastroenterology;  Laterality: N/A;   HEMOSTASIS CLIP PLACEMENT  05/17/2024   Procedure: CONTROL OF HEMORRHAGE, GI TRACT, ENDOSCOPIC, BY CLIPPING OR OVERSEWING;  Surgeon: Legrand Victory LITTIE DOUGLAS, MD;  Location: [facility] ENDOS[facility];  Service: Gastroenterology;;   ILIAC ARTERY ANEURYSM REPAIR Left 2006   Dr. Oris (redo surgery)   POLYPECTOMY  05/17/2024   Procedure: POLYPECTOMY, INTESTINE;  Surgeon: Legrand Victory LITTIE DOUGLAS, MD;  Location: [facility] ENDOS[facility];  Service: Gastroenterology;;   REPLACEMENT TOTAL KNEE Left 2008   Dr.Yeats, per [facility] new patient packet   REPLACEMENT TOTAL KNEE Right 2018   Dr.J, per [facility] new patient packet   REPLACEMENT TOTAL KNEE BILATERAL     RIGHT HEART CATH N/A 09/09/2023   Procedure: RIGHT HEART CATH;  Surgeon: Rolan Ezra RAMAN, MD;  Location: [facility] INVASIVE CV LAB;  Service: Cardiovascular;  Laterality: N/A;   TONSILLECTOMY     age 47     Social History:   reports that he quit smoking about 36 years ago. His smoking use included cigarettes. He started smoking about 59 years ago. He has a 46 pack-year smoking history. He has been exposed to tobacco smoke. He has never used smokeless tobacco. He reports that he does not currently use alcohol after a past usage of about 4.0 - 5.0 standard drinks of alcohol per week. He reports that he does not use drugs.   Family History:  His family history includes Alzheimer's disease in his father and sister; Heart attack in his father; Heart murmur in his daughter; Hypertension in his daughter; Obesity in his daughter; Other in his sister; Parkinson's disease in his mother; Stroke in his father. There is no history of Colon cancer, Esophageal cancer, Rectal cancer, or Stomach cancer.   Allergies No Known Allergies   Home Medications  Prior to Admission  medications   Medication Sig Start Date End Date Taking? Authorizing Provider  amLODipine  (NORVASC ) 5 MG tablet TAKE 1 TABLET (5 MG TOTAL) BY MOUTH DAILY. 05/28/24 05/23/25  [name], NP  aspirin EC 81 MG tablet Take 81 mg by mouth daily.    [provider]  atenolol  (TENORMIN ) 50 MG tablet Take 1.5 tablets (75 mg total) by mouth daily. 06/07/24   Fargo, Amy E, NP  Coenzyme Q10 (COQ10 PO) Take 500 mg by mouth daily.    [provider]  FIBER PO Take 4 capsules by  mouth 2 (two) times daily.    [provider]  Flaxseed, Linseed, (FLAXSEED OIL PO) Take 1 capsule by mouth daily.    [provider]  fluticasone  (FLONASE ) 50 MCG/ACT nasal spray Place 2 sprays into both nostrils daily. 12/27/22   [provider]  hydrochlorothiazide  (HYDRODIURIL ) 25 MG tablet TAKE 1 TABLET BY MOUTH EVERY DAY 04/04/24   Eubanks, Jessica K, NP  lisinopril  (ZESTRIL ) 40 MG tablet Take 1 tablet (40 mg total) by mouth daily. 06/07/24   Fargo, Amy E, NP  loratadine (CLARITIN) 10 MG tablet Take 10 mg by mouth daily.    [provider]  MAGNESIUM-POTASSIUM PO Take 1 tablet by mouth daily. With zinc Patient not taking: Reported on 06/11/2024    [provider]  naproxen (NAPROSYN) 500 MG tablet Take 1 tablet (500 mg total) by mouth 2 (two) times daily. 08/22/24   Bauer, Collin S, PA-C  omeprazole  (PRILOSEC) 20 MG capsule TAKE 1 CAPSULE BY MOUTH EVERY DAY 08/22/24   Eubanks, Jessica K, NP  Pirfenidone  801 MG TABS Take 1 tablet (801 mg total) by mouth 3 (three) times daily with meals. 06/08/24   Geronimo Amel, MD  rosuvastatin  (CRESTOR ) 10 MG tablet TAKE 1 TABLET BY MOUTH EVERY DAY 04/27/24   Croitoru, Mihai, MD  tadalafil  (CIALIS ) 20 MG tablet Take 1 tablet (20 mg total) by mouth daily as needed. 08/22/23   Stoneking, Adine PARAS., MD  TURMERIC-GINGER PO Take 2,600 mg by mouth daily.    [provider]  UNABLE TO FIND Med Name: Taking experimental drug or  possible placeb for lungs, managed by [facility] [facility] with [place]    [provider]     Critical care time: 60 mins    Christian Ilsa Bonello AGACNP-BC   [facility] 08/29/2024, 4:12 PM  Please see [email or web address] for pager details.  From 7A-7P if no response, please call [phone]. After hours, please call ELink [phone].

## 2024-08-29 NOTE — H&P (Addendum)
 History and Physical    Vincent Meyers FMW:[id number] DOB: 24-May-1946 DOA: 08/29/2024  DOS: the patient was seen and examined on 08/29/2024  PCP: [name], NP   Patient coming from: Home  I have personally briefly reviewed patient's old medical records in [facility] and CareEverywhere  HPI:   Vincent Meyers is a 78 y.o. year old male with past medical history of ILD, COPD, OSA hypertension, hyperlipidemia, and diabetes presenting to the ED with worsening shortness of breath.  Patient is on 2 L nasal cannula at baseline but had to turn his nasal cannula up to 4 L given his dyspnea.  Patient has bilateral sensorineural hearing loss.  Patient states he went to sleep fine last night. He woke up and was having dyspnea and right sided chest pain. He uses CPAP regularly. No hx of pnuemothorax.   Patient reports he was diagnosed with pulmonary fibrosis 6 years ago.  He reports that he is exerting himself more over the last few days.   ED Course: On arrival to [facility] ED patient was noted to be hemodynamically stable.  Chest x-ray was obtained that showed large right pneumothorax.  Chest tube was placed by EDP.  CBC showed slight leukocytosis, BMP showed hyperglycemia with mild hyponatremia and hypocalcemia.  TRH contacted for admission.  Review of Systems: As mentioned in the history of present illness. All other systems reviewed and are negative.   Past Medical History:  Diagnosis Date   Acid reflux    Angina pectoris    Anxiety    Arthritis    Coronary artery disease    Enlarged prostate    per [facility] new patient packet   Erectile dysfunction    High blood sugar    per [facility] new patient packet   Hyperlipidemia    Hypertension    IBS (irritable bowel syndrome)    ILD (interstitial lung disease) (HCC)    OSA (obstructive sleep apnea)    Pulmonary fibrosis (HCC)    per [facility] new patient packet    Past Surgical History:  Procedure Laterality Date   APPENDECTOMY      COLON SURGERY     perforated bowel and hernia repair   COLONOSCOPY  2024   per [facility] new patient packet   COLONOSCOPY N/A 05/17/2024   Procedure: COLONOSCOPY;  Surgeon: Legrand Victory LITTIE DOUGLAS, MD;  Location: [facility] ENDOSCOPY;  Service: Gastroenterology;  Laterality: N/A;   HEMOSTASIS CLIP PLACEMENT  05/17/2024   Procedure: CONTROL OF HEMORRHAGE, GI TRACT, ENDOSCOPIC, BY CLIPPING OR OVERSEWING;  Surgeon: Legrand Victory LITTIE DOUGLAS, MD;  Location: [facility] ENDOSCOPY;  Service: Gastroenterology;;   ILIAC ARTERY ANEURYSM REPAIR Left 2006   Dr. Oris (redo surgery)   POLYPECTOMY  05/17/2024   Procedure: POLYPECTOMY, INTESTINE;  Surgeon: Legrand Victory LITTIE DOUGLAS, MD;  Location: [facility] ENDOSCOPY;  Service: Gastroenterology;;   REPLACEMENT TOTAL KNEE Left 2008   Dr.Yeats, per [facility] new patient packet   REPLACEMENT TOTAL KNEE Right 2018   Dr.J, per [facility] new patient packet   REPLACEMENT TOTAL KNEE BILATERAL     RIGHT HEART CATH N/A 09/09/2023   Procedure: RIGHT HEART CATH;  Surgeon: Rolan Ezra RAMAN, MD;  Location: [facility] INVASIVE CV LAB;  Service: Cardiovascular;  Laterality: N/A;   TONSILLECTOMY     age 62     No Known Allergies  Family History  Problem Relation Age of Onset   Parkinson's disease Mother    Heart attack Father    Stroke Father  Alzheimer's disease Father    Alzheimer's disease Sister    Other Sister        degenerative muscle disease   Hypertension Daughter    Obesity Daughter    Heart murmur Daughter    Colon cancer Neg Hx    Esophageal cancer Neg Hx    Rectal cancer Neg Hx    Stomach cancer Neg Hx     Prior to Admission medications   Medication Sig Start Date End Date Taking? Authorizing Provider  amLODipine  (NORVASC ) 5 MG tablet TAKE 1 TABLET (5 MG TOTAL) BY MOUTH DAILY. 05/28/24 05/23/25  [name], NP  aspirin EC 81 MG tablet Take 81 mg by mouth daily.    [provider]  atenolol  (TENORMIN ) 50 MG tablet Take 1.5 tablets (75 mg total) by mouth daily. 06/07/24   Fargo, Amy E, NP   Coenzyme Q10 (COQ10 PO) Take 500 mg by mouth daily.    [provider]  FIBER PO Take 4 capsules by mouth 2 (two) times daily.    [provider]  Flaxseed, Linseed, (FLAXSEED OIL PO) Take 1 capsule by mouth daily.    [provider]  fluticasone  (FLONASE ) 50 MCG/ACT nasal spray Place 2 sprays into both nostrils daily. 12/27/22   [provider]  hydrochlorothiazide  (HYDRODIURIL ) 25 MG tablet TAKE 1 TABLET BY MOUTH EVERY DAY 04/04/24   Eubanks, Jessica K, NP  lisinopril  (ZESTRIL ) 40 MG tablet Take 1 tablet (40 mg total) by mouth daily. 06/07/24   Fargo, Amy E, NP  loratadine (CLARITIN) 10 MG tablet Take 10 mg by mouth daily.    [provider]  MAGNESIUM-POTASSIUM PO Take 1 tablet by mouth daily. With zinc Patient not taking: Reported on 06/11/2024    [provider]  naproxen (NAPROSYN) 500 MG tablet Take 1 tablet (500 mg total) by mouth 2 (two) times daily. 08/22/24   Bauer, Collin S, PA-C  omeprazole  (PRILOSEC) 20 MG capsule TAKE 1 CAPSULE BY MOUTH EVERY DAY 08/22/24   Eubanks, Jessica K, NP  Pirfenidone  801 MG TABS Take 1 tablet (801 mg total) by mouth 3 (three) times daily with meals. 06/08/24   Geronimo Amel, MD  rosuvastatin  (CRESTOR ) 10 MG tablet TAKE 1 TABLET BY MOUTH EVERY DAY 04/27/24   Croitoru, Mihai, MD  tadalafil  (CIALIS ) 20 MG tablet Take 1 tablet (20 mg total) by mouth daily as needed. 08/22/23   Stoneking, Adine PARAS., MD  TURMERIC-GINGER PO Take 2,600 mg by mouth daily.    [provider]  UNABLE TO FIND Med Name: Taking experimental drug or possible placeb for lungs, managed by [facility] [facility] with [place]    [provider]      reports that he quit smoking about 36 years ago. His smoking use included cigarettes. He started smoking about 59 years ago. He has a 46 pack-year smoking history. He has been exposed to tobacco smoke. He has never used smokeless tobacco. He reports that he does not currently use alcohol  after a past usage of about 4.0 - 5.0 standard drinks of alcohol per week. He reports that he does not use drugs. Lives with wife Currently disabled Tobacco- previous tabacco use 60 pack years but stopped smoking 30 years ago.  EtOH- 2 beers 3-4 days per week Illicit drug use- denies use.  IADLs/ADLs- can person independently at baseline    Physical Exam: Vitals:   08/29/24 1615 08/29/24 1625 08/29/24 1737 08/29/24 1819  BP: (!) 144/69  (!) 147/69 ROLLEN)  163/72  Pulse: 76 88 82 90  Resp: 14 16 (!) 21 19  Temp:      TempSrc:      SpO2: 97% 100% 96% 95%     Gen: NAD HENT: [place] in place CV: Regular rate and rhythm, decreased pedal pulses Resp: fine crackles heard diffusely, lung sounds present in all lung fields Abd: No TTP MSK: no asymmetry Skin: no lesion on examined skin Neuro: alert and oriented x4 Psych: normal mood   Labs on Admission: I have personally reviewed following labs and imaging studies  CBC: Recent Labs  Lab 08/29/24 1000  WBC 12.7*  HGB 15.4  HCT 47.5  MCV 82.2  PLT 205   Basic Metabolic Panel: Recent Labs  Lab 08/29/24 1000  NA 130*  K 4.3  CL 93*  CO2 25  GLUCOSE 235*  BUN 8  CREATININE 0.53*  CALCIUM  8.8*   GFR: Estimated Creatinine Clearance: 84.1 mL/min (A) (by C-G formula based on SCr of 0.53 mg/dL (L)). Liver Function Tests: No results for input(s): AST, ALT, ALKPHOS, BILITOT, PROT, ALBUMIN in the last 168 hours. No results for input(s): LIPASE, AMYLASE in the last 168 hours. No results for input(s): AMMONIA in the last 168 hours. Coagulation Profile: No results for input(s): INR, PROTIME in the last 168 hours. Cardiac Enzymes: Recent Labs  Lab 08/29/24 1000 08/29/24 1337  TROPONINIHS 7 8   BNP (last 3 results) No results for input(s): BNP in the last 8760 hours. HbA1C: No results for input(s): HGBA1C in the last 72 hours. CBG: No results for input(s): GLUCAP in the last 168 hours. Lipid  Profile: No results for input(s): CHOL, HDL, LDLCALC, TRIG, CHOLHDL, LDLDIRECT in the last 72 hours. Thyroid Function Tests: No results for input(s): TSH, T4TOTAL, FREET4, T3FREE, THYROIDAB in the last 72 hours. Anemia Panel: No results for input(s): VITAMINB12, FOLATE, FERRITIN, TIBC, IRON, RETICCTPCT in the last 72 hours. Urine analysis:    Component Value Date/Time   APPEARANCEUR Clear 12/08/2023 1505   GLUCOSEU Negative 12/08/2023 1505   BILIRUBINUR Negative 12/08/2023 1505   PROTEINUR Negative 12/08/2023 1505   NITRITE Negative 12/08/2023 1505   LEUKOCYTESUR Negative 12/08/2023 1505    Radiological Exams on Admission: I have personally reviewed images DG Chest Portable 1 View Result Date: 08/29/2024 CLINICAL DATA:  Chest tube insertion. EXAM: PORTABLE CHEST 1 VIEW COMPARISON:  08/29/2024 at 1222 FINDINGS: Right chest tube is coiled in the lateral aspect of the right chest. Right pneumothorax has completely resolved. Again noted are prominent interstitial markings in both lungs compatible with chronic changes. Slightly decreased lung volumes. The trachea is midline. Heart size is normal limits. IMPRESSION: 1. Right chest tube in place.  Right pneumothorax has resolved. 2. Chronic lung changes. Electronically Signed   By: Juliene Balder M.D.   On: 08/29/2024 15:29   DG Chest Port 1 View Result Date: 08/29/2024 CLINICAL DATA:  Chest tube removal. EXAM: PORTABLE CHEST 1 VIEW COMPARISON:  08/29/2024 at 1222 hours. FINDINGS: Trachea is midline. Heart size stable. Right pneumothorax is increased in size, right pneumothorax has increased, now moderate in size, status post chest tube removal. Coarsened interstitial markings, better evaluated on CT chest 12/06/2023. IMPRESSION: 1. Moderate right pneumothorax, increased from earlier today, status post right chest tube removal. 2. Coarsened pulmonary markings, indicative of interstitial lung disease, characterized as  usual interstitial pneumonitis on 12/06/2023 CT chest. Electronically Signed   By: Newell Eke M.D.   On: 08/29/2024 14:50   DG Chest Portable 1  View Result Date: 08/29/2024 CLINICAL DATA:  Chest tube insertion. EXAM: PORTABLE CHEST 1 VIEW COMPARISON:  08/29/2024 FINDINGS: Pigtail right chest tube has been placed. The tube is reconstituted along the lateral aspect of the right chest. The right pneumothorax has significantly decreased in size. Residual 10% pneumothorax. Patchy densities in the right lung and densities at the left lung base most likely related to atelectasis. Patient also has chronic lung changes. Trachea is midline. Heart size is grossly stable. IMPRESSION: 1. Placement of right chest tube with significant decrease in the right pneumothorax. Residual 10% right pneumothorax. 2. Patchy densities in the right lung and left lung base. Findings are most compatible with atelectasis. Electronically Signed   By: Juliene Balder M.D.   On: 08/29/2024 13:04   DG Chest 2 View Result Date: 08/29/2024 CLINICAL DATA:  Shortness of breath. Decreased oxygen saturations. History of pulmonary fibrosis. EXAM: CHEST - 2 VIEW COMPARISON:  08/22/2024 FINDINGS: Patient has developed a large right pneumothorax measuring greater than 50%. Significant volume loss in the right lung. Prominent interstitial densities in left lung compatible with chronic interstitial lung disease and volume loss. Trachea remains midline. Cardiac silhouette is grossly stable but poorly characterized on this examination. No large pleural effusion. Bony thorax appears intact. No obvious displaced rib fracture. IMPRESSION: 1. New large right pneumothorax. 2. Chronic interstitial lung disease. These results were called by telephone at the time of interpretation on 08/29/2024 at 10:51 am to provider [facility] , who verbally acknowledged these results. Electronically Signed   By: Juliene Balder M.D.   On: 08/29/2024 10:53    EKG: My personal  interpretation of EKG shows: Normal sinus rhythm with left axis deviation. No acute ST changes.    Assessment/Plan Principal Problem:   Pneumothorax on right Active Problems:   PAD (peripheral artery disease)   Hyperlipemia, mixed   Essential hypertension   ILD (interstitial lung disease) (HCC)   Coronary artery disease involving native coronary artery of native heart without angina pectoris   COPD (chronic obstructive pulmonary disease) (HCC)   GERD (gastroesophageal reflux disease)   Sensorineural hearing loss, bilateral   Pneumothorax of right lung: Patient presented with right-sided chest pain and shortness of breath and was found to have moderate right-sided pneumothorax.  Chest tube was placed by EDP that resulted in resolution of the pneumothorax.  Pulmonology was consulted and recommended continuing chest tube and monitoring serial chest x-rays.  Appreciate their input.  Appears pneumothorax is multifactorial given poor baseline lung function along with strenuous exertion compounded by CPAP use.  Will hold CPAP and incentive spirometry per pulmonology.  Will give oxycodone 5 mg as needed for pain.  Chronic problems ILD: Follows with Dr. Geronimo outpatient.  Continue home pirfenidone  801 mg 3 times daily. Hypertension: Continue home antihypertensives Hyperlipidemia/PAD/CAD: Continue home statin. LDL <70 on 04/25 GERD: Continue home PPI DMII: daily CBG COPD: Pt with COPD but not on any inhalers as they have been ineffective for him.  Continue outpatient treatment regimen here.   VTE prophylaxis:  Heparin  Diet: HH  Code Status:  Full Code Telemetry:  Admission status: Inpatient, Progressive Patient is from: Home  Anticipated d/c is to: Home  Anticipated d/c is in: 4 days    Family Communication: Updated at bedside   Consults called: PCCM   Severity of Illness: The appropriate patient status for this patient is INPATIENT. Inpatient status is judged to be reasonable  and necessary in order to provide the required intensity of service to ensure  the patient's safety. The patient's presenting symptoms, physical exam findings, and initial radiographic and laboratory data in the context of their chronic comorbidities is felt to place them at high risk for further clinical deterioration. Furthermore, it is not anticipated that the patient will be medically stable for discharge from the hospital within 2 midnights of admission.   * I certify that at the point of admission it is my clinical judgment that the patient will require inpatient hospital care spanning beyond 2 midnights from the point of admission due to high intensity of service, high risk for further deterioration and high frequency of surveillance required.DEWAINE Morene Bathe, MD [place] DEL. [facility]

## 2024-08-29 NOTE — ED Triage Notes (Signed)
 Pt states increased sob since he woke up this am.  Normally on 2L [place], has increased O2 to 4L [place].  Dyspnea with speaking.  Also states R chest pain that he was tx for last week has returned.

## 2024-08-29 NOTE — ED Provider Notes (Addendum)
 [place] EMERGENCY DEPARTMENT AT [facility] Provider Note   CSN: [id number] Arrival date & time: 08/29/24  9051     Patient presents with: Shortness of Breath   Vincent Meyers is a 78 y.o. male.   78 year old male presents for evaluation of shortness of breath.  He was here a week ago for similar symptoms.  States his pain got better but then this morning he woke up and pain got much worse.  Also states he is very short of breath.  He is normally on 2 L nasal cannula but today had to turn his nasal cannula up to 4 and he states with walking his oxygen saturation dropped to 70%.  States he feels more comfortable sitting up and laying flat.  Denies any other symptoms or concerns.   Shortness of Breath Associated symptoms: chest pain and cough   Associated symptoms: no abdominal pain, no ear pain, no fever, no rash, no sore throat and no vomiting        Prior to Admission medications   Medication Sig Start Date End Date Taking? Authorizing Provider  amLODipine  (NORVASC ) 5 MG tablet TAKE 1 TABLET (5 MG TOTAL) BY MOUTH DAILY. 05/28/24 05/23/25 Yes [name], NP  aspirin EC 81 MG tablet Take 81 mg by mouth daily.   Yes [provider]  atenolol  (TENORMIN ) 50 MG tablet Take 1.5 tablets (75 mg total) by mouth daily. 06/07/24  Yes Fargo, Amy E, NP  Coenzyme Q10 (COQ10 PO) Take 500 mg by mouth daily.   Yes [provider]  FIBER PO Take 4 capsules by mouth 2 (two) times daily.   Yes [provider]  Flaxseed, Linseed, (FLAXSEED OIL PO) Take 1 capsule by mouth daily.   Yes [provider]  fluticasone  (FLONASE ) 50 MCG/ACT nasal spray Place 2 sprays into both nostrils daily. 12/27/22  Yes [provider]  hydrochlorothiazide  (HYDRODIURIL ) 25 MG tablet TAKE 1 TABLET BY MOUTH EVERY DAY 04/04/24  Yes Eubanks, Jessica K, NP  lisinopril  (ZESTRIL ) 40 MG tablet Take 1 tablet (40 mg total) by mouth daily. 06/07/24  Yes Fargo, Amy E, NP  loratadine  (CLARITIN) 10 MG tablet Take 10 mg by mouth daily as needed for allergies.   Yes [provider]  MAGNESIUM PO Take 2 tablets by mouth daily.   Yes [provider]  omeprazole  (PRILOSEC) 20 MG capsule TAKE 1 CAPSULE BY MOUTH EVERY DAY 08/22/24  Yes Eubanks, Jessica K, NP  Pirfenidone  801 MG TABS Take 1 tablet (801 mg total) by mouth 3 (three) times daily with meals. 06/08/24  Yes Geronimo Amel, MD  rosuvastatin  (CRESTOR ) 10 MG tablet TAKE 1 TABLET BY MOUTH EVERY DAY 04/27/24  Yes Croitoru, Mihai, MD  tadalafil  (CIALIS ) 20 MG tablet Take 1 tablet (20 mg total) by mouth daily as needed. 08/22/23  Yes Stoneking, Adine PARAS., MD  TURMERIC-GINGER PO Take 2,600 mg by mouth daily.   Yes [provider]  guaiFENesin (MUCINEX) 600 MG 12 hr tablet Take 1 tablet (600 mg total) by mouth 2 (two) times daily. 09/07/24   Arrien, Elidia Sieving, MD  ipratropium-albuterol  (DUONEB) 0.5-2.5 (3) MG/3ML SOLN Take 3 mLs by nebulization every 6 (six) hours as needed (shortness of breath). 09/07/24   Arrien, Mauricio Daniel, MD  naproxen (NAPROSYN) 500 MG tablet Take 1 tablet (500 mg total) by mouth 2 (two) times daily as needed (as needed for pain). 09/07/24   Arrien, Mauricio Daniel, MD    Allergies: Patient  has no known allergies.    Review of Systems  Constitutional:  Negative for chills and fever.  HENT:  Negative for ear pain and sore throat.   Eyes:  Negative for pain and visual disturbance.  Respiratory:  Positive for cough and shortness of breath.   Cardiovascular:  Positive for chest pain. Negative for palpitations.  Gastrointestinal:  Negative for abdominal pain and vomiting.  Genitourinary:  Negative for dysuria and hematuria.  Musculoskeletal:  Negative for arthralgias and back pain.  Skin:  Negative for color change and rash.  Neurological:  Negative for seizures and syncope.  All other systems reviewed and are negative.   Updated Vital Signs BP (!) 140/67 (BP Location:  Left Arm)   Pulse 69   Temp (!) 97.5 F (36.4 C) (Oral)   Resp 20   Ht 5' 7 (1.702 m)   Wt 90.2 kg   SpO2 99%   BMI 31.15 kg/m   Physical Exam Vitals and nursing note reviewed.  Constitutional:      General: He is not in acute distress.    Appearance: He is well-developed. He is not ill-appearing.  HENT:     Head: Normocephalic and atraumatic.  Eyes:     Conjunctiva/sclera: Conjunctivae normal.  Cardiovascular:     Rate and Rhythm: Normal rate and regular rhythm.     Heart sounds: No murmur heard. Pulmonary:     Effort: Pulmonary effort is normal. No respiratory distress.     Breath sounds: Decreased breath sounds and rhonchi present.  Abdominal:     Palpations: Abdomen is soft.     Tenderness: There is no abdominal tenderness.  Musculoskeletal:        General: No swelling.     Cervical back: Neck supple.  Skin:    General: Skin is warm and dry.     Capillary Refill: Capillary refill takes less than 2 seconds.  Neurological:     Mental Status: He is alert.  Psychiatric:        Mood and Affect: Mood normal.     (all labs ordered are listed, but only abnormal results are displayed) Labs Reviewed  BASIC METABOLIC PANEL WITH GFR - Abnormal; Notable for the following components:      Result Value   Sodium 130 (*)    Chloride 93 (*)    Glucose, Bld 235 (*)    Creatinine, Ser 0.53 (*)    Calcium  8.8 (*)    All other components within normal limits  CBC - Abnormal; Notable for the following components:   WBC 12.7 (*)    All other components within normal limits  CBC - Abnormal; Notable for the following components:   WBC 14.8 (*)    All other components within normal limits  COMPREHENSIVE METABOLIC PANEL WITH GFR - Abnormal; Notable for the following components:   Sodium 126 (*)    Chloride 92 (*)    Glucose, Bld 149 (*)    Creatinine, Ser 0.58 (*)    Calcium  8.5 (*)    Total Protein 6.1 (*)    Albumin 3.2 (*)    All other components within normal limits   BASIC METABOLIC PANEL WITH GFR - Abnormal; Notable for the following components:   Sodium 126 (*)    Chloride 91 (*)    Glucose, Bld 141 (*)    Creatinine, Ser 0.55 (*)    Calcium  8.5 (*)    All other components within normal limits  CBC - Abnormal; Notable for  the following components:   WBC 14.8 (*)    All other components within normal limits  BASIC METABOLIC PANEL WITH GFR - Abnormal; Notable for the following components:   Sodium 131 (*)    Chloride 96 (*)    Glucose, Bld 136 (*)    Creatinine, Ser 0.40 (*)    Calcium  8.5 (*)    All other components within normal limits  CBC - Abnormal; Notable for the following components:   WBC 14.5 (*)    All other components within normal limits  BASIC METABOLIC PANEL WITH GFR - Abnormal; Notable for the following components:   Sodium 129 (*)    Chloride 93 (*)    Glucose, Bld 139 (*)    Calcium  8.8 (*)    All other components within normal limits  CBC - Abnormal; Notable for the following components:   WBC 14.4 (*)    All other components within normal limits  BASIC METABOLIC PANEL WITH GFR - Abnormal; Notable for the following components:   Sodium 130 (*)    Chloride 96 (*)    Glucose, Bld 143 (*)    Creatinine, Ser 0.53 (*)    Calcium  8.6 (*)    All other components within normal limits  CBC - Abnormal; Notable for the following components:   WBC 14.4 (*)    All other components within normal limits  BASIC METABOLIC PANEL WITH GFR - Abnormal; Notable for the following components:   Sodium 132 (*)    Chloride 96 (*)    Glucose, Bld 130 (*)    Creatinine, Ser 0.50 (*)    Calcium  8.6 (*)    All other components within normal limits  CBC - Abnormal; Notable for the following components:   WBC 12.5 (*)    All other components within normal limits  BASIC METABOLIC PANEL WITH GFR - Abnormal; Notable for the following components:   Sodium 132 (*)    Chloride 96 (*)    Glucose, Bld 128 (*)    Creatinine, Ser 0.53 (*)     Calcium  8.7 (*)    All other components within normal limits  CBC - Abnormal; Notable for the following components:   WBC 12.7 (*)    All other components within normal limits  BASIC METABOLIC PANEL WITH GFR - Abnormal; Notable for the following components:   Sodium 131 (*)    Chloride 96 (*)    Glucose, Bld 120 (*)    Creatinine, Ser 0.54 (*)    Calcium  8.7 (*)    All other components within normal limits  CBC - Abnormal; Notable for the following components:   WBC 12.4 (*)    All other components within normal limits  RESP PANEL BY RT-PCR (RSV, FLU A&B, COVID)  RVPGX2  MAGNESIUM  MAGNESIUM  MAGNESIUM  MAGNESIUM  MAGNESIUM  MAGNESIUM  MAGNESIUM  TROPONIN I (HIGH SENSITIVITY)  TROPONIN I (HIGH SENSITIVITY)    EKG: EKG Interpretation Date/Time:  Wednesday August 29 2024 10:00:21 EDT Ventricular Rate:  92 PR Interval:  164 QRS Duration:  88 QT Interval:  328 QTC Calculation: 405 R Axis:   -34  Text Interpretation: Normal sinus rhythm Left axis deviation Pulmonary disease pattern Nonspecific ST and T wave abnormality Compared with prior EKG from 08/22/2024 Confirmed by Gennaro Bouchard ([id number]) on 08/29/2024 10:44:30 AM  Radiology: No results found.    CHEST TUBE INSERTION  Date/Time: 08/29/2024 12:30 PM  Performed by: Gennaro Bouchard CROME, DO Authorized by: Gennaro Bouchard  L, DO   Consent:    Consent obtained:  Verbal and written   Consent given by:  Patient   Risks, benefits, and alternatives were discussed: yes     Risks discussed:  Incomplete drainage, nerve damage, pain, infection, damage to surrounding structures and bleeding   Alternatives discussed:  No treatment Universal protocol:    Procedure explained and questions answered to patient or proxy's satisfaction: yes     Relevant documents present and verified: yes     Test results available: yes     Imaging studies available: yes     Required blood products, implants, devices, and special equipment  available: yes     Site/side marked: yes     Immediately prior to procedure, a time out was called: yes     Patient identity confirmed:  Verbally with patient Pre-procedure details:    Skin preparation:  Chlorhexidine with alcohol and chlorhexidine   Preparation: Patient was prepped and draped in the usual sterile fashion   Sedation:    Sedation type:  None Anesthesia:    Anesthesia method:  Local infiltration   Local anesthetic:  Lidocaine  1% w/o epi Procedure details:    Placement location:  R lateral   Scalpel size:  11   Tube size (Fr):  Minicatheter   Ultrasound guidance: no     Tension pneumothorax: no     Tube connected to:  Suction   Drainage characteristics:  Bloody and clear   Suture material:  0 silk   Dressing:  4x4 sterile gauze and Xeroform gauze Post-procedure details:    Post-insertion x-ray findings: tube repositioned     Post-insertion x-ray findings comment:  Tube was in the lung cavity but was advcanced slightly   Procedure completion:  Tolerated Comments:     Chest tube placed successfully by Leita Chancy, PA under my direct supervision.  I was scrubbed and available and assisted with the entire procedure.  Patient tolerated procedure well and on chest x-ray done postprocedure he had reinflation of his lung.  He also had immediate improvement in his oxygen saturation and symptoms    Medications Ordered in the ED  0.9 %  sodium chloride  infusion ( Intravenous New Bag/Given 08/31/24 2039)  lidocaine  (XYLOCAINE ) 2 % (with pres) injection (  Return to [facility] 08/29/24 1528)  lidocaine  (PF) (XYLOCAINE ) 1 % injection (10 mLs  Given by Other 08/29/24 1528)  Influenza vac split trivalent PF (FLUZONE HIGH-DOSE) injection 0.5 mL (0.5 mLs Intramuscular Given 09/07/24 1211)                                    Medical Decision Making Cardiac monitor interpretation: Sinus rhythm to sinus tachycardia, no ectopy  Patient here for shortness of breath.  Found to have large  pneumothorax on chest x-ray.  Chest tube was placed as above successfully.  Patient had improvement in his symptoms oxygen saturation and reinflation of his lung on repeat chest x-ray.  Discussed patient's case with Dr. Fernand and patient will be admitted to the hospital service for further workup and management.  Workup otherwise unremarkable.  Spoke with Fonda Ni, pulmonologist and they will consult and help manage the patient's Pleur-evac and chest tube.  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    Details: ED records reviewed from 1 week ago patient had negative PE study Labs: ordered. Decision-making details documented in ED Course.    Details: Ordered and reviewed by me and unremarkable Radiology: ordered and independent interpretation performed. Decision-making details documented in ED Course.    Details: Ordered and interpreted me independently radiology, also discussed with radiology Chest x-ray shows large pneumothorax Repeat chest x-ray shows chest tube in the pleural space, however it is slightly lateral and was advanced, there is almost full reexpansion of the lung ECG/medicine tests: ordered and independent interpretation performed. Decision-making details documented in ED Course.    Details: Ordered and interpreted me in the absence of cardiology and shows sinus rhythm, no STEMI or acute change when compared to prior Discussion of management or test interpretation with external provider(s): Fonda Ni - pulmonology - their  team will help manage the chest tube  Dr. Fernand -hospitalist-he will with the patient to the service for further workup and management  Risk OTC drugs. Prescription drug management. Drug therapy requiring intensive monitoring for toxicity. Decision regarding hospitalization. Risk Details: CRITICAL CARE Performed by: Duwaine LITTIE Fusi   Total critical care time: 35 minutes  Critical care time was exclusive of separately billable procedures and treating other patients.  Critical care was necessary to treat or prevent imminent or life-threatening deterioration.  Critical care was time spent personally by me on the following activities: development of treatment plan with patient and/or surrogate as well as nursing, discussions with consultants, evaluation of patient's response to treatment, examination of patient, obtaining history from patient or surrogate, ordering and performing treatments and interventions, ordering and review of laboratory studies, ordering and review of radiographic studies, pulse oximetry and re-evaluation of patient's condition.   Critical Care Total time providing critical care: 35 minutes    Final diagnoses:  Acute hypoxic respiratory failure (HCC)  Pneumothorax, unspecified type  History of pulmonary fibrosis    ED Discharge Orders          Ordered    naproxen (NAPROSYN) 500 MG tablet  2 times daily PRN        09/07/24 1211    ipratropium-albuterol  (DUONEB) 0.5-2.5 (3) MG/3ML SOLN  Every 6 hours PRN        09/07/24 1211    For home use only DME Nebulizer machine        09/07/24 1211    Increase activity slowly        09/07/24 1211    Diet - low sodium heart healthy        09/07/24 1211    No wound care        09/07/24 1211    Discharge instructions       Comments: Please follow up with primary care in 7 to 10 days, follow up with Pulmonary as scheduled.   09/07/24 1211    guaiFENesin (MUCINEX) 600 MG 12 hr tablet  2 times daily        09/07/24  1212               Latarshia Jersey L, DO 08/29/24 1537    Caitlyn Buchanan L, DO 09/09/24 [phone]

## 2024-08-29 NOTE — ED Notes (Signed)
Pulmonologist at [facility]

## 2024-08-29 NOTE — Procedures (Signed)
 Insertion of Chest Tube Procedure Note  JIMY GATES  [id number]  07-Jul-1946  Date:08/29/24  Time:3:33 PM    Provider Performing: Fonda JAYSON Sharps   Procedure: Chest Tube Insertion [phone])  Indication(s) Pneumothorax  Consent Risks of the procedure as well as the alternatives and risks of each were explained to the patient and/or caregiver.  Consent for the procedure was obtained and is signed in the bedside chart  Anesthesia Topical only with 1% lidocaine     Time Out Verified patient identification, verified procedure, site/side was marked, verified correct patient position, special equipment/implants available, medications/allergies/relevant history reviewed, required imaging and test results available.   Sterile Technique Maximal sterile technique including full sterile barrier drape, hand hygiene, sterile gown, sterile gloves, mask, hair covering, sterile ultrasound probe cover (if used).   Procedure Description Ultrasound used to identify appropriate pleural anatomy for placement and overlying skin marked. Area of placement cleaned and draped in sterile fashion.  A 14 French pigtail pleural catheter was placed into the right pleural space using Seldinger technique. Appropriate return of air was obtained.  The tube was connected to atrium and placed on -20 cm H2O wall suction.   Complications/Tolerance None; patient tolerated the procedure well. Chest X-ray is ordered to verify placement.   EBL Minimal  Specimen(s) none   Christian Nathyn Luiz AGACNP-BC   [facility] 08/29/2024, 3:33 PM  Please see [email or web address] for pager details.  From 7A-7P if no response, please call [phone]. After hours, please call ELink [phone].

## 2024-08-29 NOTE — ED Notes (Addendum)
 Xeroform and split-gauze dressing applied to chest tube site. Area is clean and dry with minimal sanguinous drainage.

## 2024-08-29 NOTE — ED Notes (Signed)
 Pigtail chest tube inserted on anterior upper chest with dressing placed by pulmonology team.

## 2024-08-29 NOTE — ED Notes (Addendum)
 This RN notified by pulmonary team that chest tube was noted to be out of patient by admitting provider and need set-up for another chest tube insertion.

## 2024-08-29 NOTE — ED Notes (Signed)
 EDP at bedside to place R chest tube with RN assisting.

## 2024-08-29 NOTE — ED Notes (Signed)
 Care of pt assumed from previous RN. Right side chest tube in place to LIWS w/ serosanguinous OP. Pt in NAD, reports improvement of SOB. Pt on baseline 2 L . Pt able to stand at bedside to use urinal. Pt remains on continuous cardiac monitoring, call bell in reach, family at bedside.

## 2024-08-30 ENCOUNTER — Inpatient Hospital Stay (HOSPITAL_COMMUNITY)

## 2024-08-30 ENCOUNTER — Other Ambulatory Visit: Payer: Self-pay

## 2024-08-30 DIAGNOSIS — J939 Pneumothorax, unspecified: Secondary | ICD-10-CM | POA: Diagnosis not present

## 2024-08-30 DIAGNOSIS — Z4682 Encounter for fitting and adjustment of non-vascular catheter: Secondary | ICD-10-CM | POA: Diagnosis not present

## 2024-08-30 LAB — COMPREHENSIVE METABOLIC PANEL WITH GFR
ALT: 19 U/L (ref 0–44)
AST: 23 U/L (ref 15–41)
Albumin: 3.2 g/dL — ABNORMAL LOW (ref 3.5–5.0)
Alkaline Phosphatase: 47 U/L (ref 38–126)
Anion gap: 9 (ref 5–15)
BUN: 10 mg/dL (ref 8–23)
CO2: 25 mmol/L (ref 22–32)
Calcium: 8.5 mg/dL — ABNORMAL LOW (ref 8.9–10.3)
Chloride: 92 mmol/L — ABNORMAL LOW (ref 98–111)
Creatinine, Ser: 0.58 mg/dL — ABNORMAL LOW (ref 0.61–1.24)
GFR, Estimated: >60 mL/min (ref >60)
Glucose, Bld: 149 mg/dL — ABNORMAL HIGH (ref 70–99)
Potassium: 4.2 mmol/L (ref 3.5–5.1)
Sodium: 126 mmol/L — ABNORMAL LOW (ref 135–145)
Total Bilirubin: 0.9 mg/dL (ref 0.0–1.2)
Total Protein: 6.1 g/dL — ABNORMAL LOW (ref 6.5–8.1)

## 2024-08-30 LAB — CBC
HCT: 42.5 % (ref 39.0–52.0)
Hemoglobin: 14.2 g/dL (ref 13.0–17.0)
MCH: 27.3 pg (ref 26.0–34.0)
MCHC: 33.4 g/dL (ref 30.0–36.0)
MCV: 81.6 fL (ref 80.0–100.0)
Platelets: 169 K/uL (ref 150–400)
RBC: 5.21 MIL/uL (ref 4.22–5.81)
RDW: 13.2 % (ref 11.5–15.5)
WBC: 14.8 K/uL — ABNORMAL HIGH (ref 4.0–10.5)
nRBC: 0 % (ref 0.0–0.2)

## 2024-08-30 MED ORDER — ONDANSETRON HCL 4 MG/2ML IJ SOLN: 4.0000 mg | Freq: Four times a day (QID) | INTRAMUSCULAR | Status: DC | PRN

## 2024-08-30 MED ORDER — GLUCAGON HCL RDNA (DIAGNOSTIC) 1 MG IJ SOLR: 1.0000 mg | INTRAMUSCULAR | Status: DC | PRN

## 2024-08-30 MED ORDER — IPRATROPIUM-ALBUTEROL 0.5-2.5 (3) MG/3ML IN SOLN
3.0000 mL | RESPIRATORY_TRACT | Status: DC | PRN
  Administered 2024-08-30: 3 mL via RESPIRATORY_TRACT
  Filled 2024-08-30: qty 3

## 2024-08-30 MED ORDER — METOPROLOL TARTRATE 5 MG/5ML IV SOLN: 5.0000 mg | INTRAVENOUS | Status: DC | PRN

## 2024-08-30 MED ORDER — HYDRALAZINE HCL 20 MG/ML IJ SOLN: 10.0000 mg | INTRAMUSCULAR | Status: DC | PRN

## 2024-08-30 MED ORDER — PIRFENIDONE 801 MG PO TABS
801.0000 mg | ORAL_TABLET | Freq: Three times a day (TID) | ORAL | Status: DC
  Administered 2024-08-30 – 2024-09-07 (×25): 801 mg via ORAL
  Filled 2024-08-30 (×28): qty 1

## 2024-08-30 NOTE — Hospital Course (Addendum)
 Vincent Meyers was admitted to the hospital with the working diagnosis of spontaneous pneumothorax in the setting of interstitial lung disease.   78 year old with history of ILD, COPD, OSA, HTN, HLD, DM2 comes to the ED with dyspnea. Patient developed acute dyspnea and right sided chest pain. He required increased 02 flow from 2 to 4 L/min per [place]. Rapidly progressive dyspnea to the point where he was symptomatic with minimal efforts.  On his initial physical examination his blood pressure was 144/69, HR 76, RR 21 and 02 saturation 95% Lungs with bilateral rales, with no wheezing, heart with S1 and S2 present and regular, abdomen with no distention, no lower extremity edema.   Na 130, K 4.3 Cl 93 bicarbonate 25 glucose 235, bun 8 cr 0,53  High sensitive troponin 7  Wbc 12.7 hgb 15.4 plt 205  Sars covid 19 negative Influenza negative RSV negative   Chest radiograph with large right pneumothorax with left sided mediastinal shift, left lung with diffuse interstitial infiltrates.   EKG 92 bpm, left axis deviation, left anterior fascicular block, qtc 405, sinus rhythm with no significant ST segment or T wave changes.   Pulmonary was consulted and patient underwent chest tube placement on the right side.  Chest tube connected to suction.  CT chest showed moderate size pneumothorax but improved with suction.  Evaluated by CT surgery, no further intervention.  Right lung expanded well, tube was placed to gravity and then it was clamped.  Follow up chest radiograph with resolution of pneumothorax.  Plan to continue follow up with pulmonary as outpatient, continue supplemental 02 per [place] to keep 02 saturation 88% or greater.

## 2024-08-30 NOTE — Evaluation (Signed)
 Physical Therapy Evaluation Patient Details Name: Vincent Meyers MRN: [id number] DOB: [birth year] Today's Date: 08/30/2024  History of Present Illness  78 year old comes to the ED 10/8 with shortness of breath. Chest x-ray showed large right-sided spontaneous pneumothorax requiring chest tube placement. PMH: ILD, COPD, OSA, HTN, HLD, DM2.  Clinical Impression  Pt admitted with above diagnosis. Previously independent with occasional use of rollator for long distance gait. 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 Supervision for gait, feels that he fatigues more rapidly than usual. SpO2 drops to 87% on 2L with gait, increased quickly back into 90s upon sitting to rest. Minimal dyspnea. Will follow and progress acutely. Pt currently with functional limitations due to the deficits listed below (see PT Problem List). Pt will benefit from acute skilled PT to increase their independence and safety with mobility to allow discharge.           If plan is discharge home, recommend the following: Assist for transportation;Help with stairs or ramp for entrance   Can travel by private vehicle        Equipment Recommendations None recommended by PT  Recommendations for Other Services       Functional Status Assessment Patient has had a recent decline in their functional status and demonstrates the ability to make significant improvements in function in a reasonable and predictable amount of time.     Precautions / Restrictions Precautions Precautions: Fall Recall of Precautions/Restrictions: Intact Precaution/Restrictions Comments: Rt chest tube (high placement) Restrictions Weight Bearing Restrictions Per Provider Order: No      Mobility  Bed Mobility Overal bed mobility: Modified Independent             General bed mobility comments: extra time, cues for  awareness of chest tube location at all times    Transfers Overall transfer level: Modified independent Equipment used: Rolling walker (2 wheels)               General transfer comment: Good power up to stand, steadies himself with RW.    Ambulation/Gait Ambulation/Gait assistance: Supervision Gait Distance (Feet): 125 Feet Assistive device: Rolling walker (2 wheels) Gait Pattern/deviations: Step-through pattern, Decreased stride length, Trunk flexed Gait velocity: WFL Gait velocity interpretation: >2.62 ft/sec, indicative of community ambulatory   General Gait Details: Educated on symptom awareness and safe AD use with RW for support. Moderately fatigued with distance mild DOE. No overt buckling or LOB. Feels close to baseline but fatigued faster than he normally would. SpO2 87% on 2L with gait, improved quickly after sitting to rest.  Stairs            Wheelchair Mobility     Tilt Bed    Modified Rankin (Stroke Patients Only)       Balance Overall balance assessment: Mild deficits observed, not formally tested                                           Pertinent Vitals/Pain Pain Assessment Pain Assessment: No/denies pain    Home Living Family/patient expects to be discharged to:: Private residence Living Arrangements: Spouse/significant other Available Help at Discharge: Family Type of Home: House Home Access: Stairs to enter Entrance Stairs-Rails: None Entrance Stairs-Number of Steps: 2   Home Layout:  One level Home Equipment: Cane - single Acupuncturist (4 wheels)      Prior Function Prior Level of Function : Independent/Modified Independent;History of Falls (last six months)             Mobility Comments: ind sometimes using rollator ADLs Comments: ind. active, works on cars and motorcycles.     Extremity/Trunk Assessment   Upper Extremity Assessment Upper Extremity Assessment: Defer to OT  evaluation    Lower Extremity Assessment Lower Extremity Assessment: Generalized weakness       Communication   Communication Communication: No apparent difficulties    Cognition Arousal: Alert Behavior During Therapy: WFL for tasks assessed/performed   PT - Cognitive impairments: No apparent impairments                         Following commands: Intact       Cueing Cueing Techniques: Verbal cues     General Comments General comments (skin integrity, edema, etc.): SpO2 drops <88% on RA at rest. 100% on 2L at rest. 87% on 2L ambulating 125 feet mild dyspnea. Educated on pacing, BORG scale, energy conservation. Significant other present, very supportive.    Exercises     Assessment/Plan    PT Assessment Patient needs continued PT services  PT Problem List Decreased activity tolerance;Decreased mobility;Decreased balance;Cardiopulmonary status limiting activity       PT Treatment Interventions DME instruction;Gait training;Stair training;Functional mobility training;Therapeutic activities;Therapeutic exercise;Balance training;Neuromuscular re-education;Patient/family education    PT Goals (Current goals can be found in the Care Plan section)  Acute Rehab PT Goals Patient Stated Goal: Get well PT Goal Formulation: With patient Time For Goal Achievement: 09/13/24 Potential to Achieve Goals: Good    Frequency Min 2X/week     Co-evaluation               AM-PAC PT 6 Clicks Mobility  Outcome Measure Help needed turning from your back to your side while in a flat bed without using bedrails?: None Help needed moving from lying on your back to sitting on the side of a flat bed without using bedrails?: None Help needed moving to and from a bed to a chair (including a wheelchair)?: None Help needed standing up from a chair using your arms (e.g., wheelchair or bedside chair)?: None Help needed to walk in hospital room?: A Little Help needed climbing 3-5  steps with a railing? : A Little 6 Click Score: 22    End of Session Equipment Utilized During Treatment: Oxygen Activity Tolerance: Patient tolerated treatment well Patient left: in bed;with call bell/phone within reach;with family/visitor present   PT Visit Diagnosis: Other abnormalities of gait and mobility (R26.89);Difficulty in walking, not elsewhere classified (R26.2)    Time: 8399-8362 PT Time Calculation (min) (ACUTE ONLY): 37 min   Charges:   PT Evaluation $PT Eval Low Complexity: 1 Low PT Treatments $Gait Training: 8-22 mins PT General Charges $$ ACUTE PT VISIT: 1 Visit         Leontine Roads, PT, DPT [facility]  Rehabilitation Services Physical Therapist Office: [phone] Website: [email or web address]   Leontine GORMAN Roads 08/30/2024, 5:15 PM

## 2024-08-30 NOTE — Significant Event (Signed)
 Rapid Response Event Note   Reason for Call :  SOB, hypoxia. Per RN, pt dropped SpO2-70s on 2L [place]. His FiO2 was increased to 4L [place] with SpO2 increasing to 93%.  Initial Focused Assessment:  Pt standing beside bed using urinal on RRT arrival. Pt assessed once back in bed. Pt lying in bed with eyes open. His breathing is mildly labored. He says his SOB was acute in natural, starting approx 30 min ago. Lung sounds with crackles in the base on the L, diminished with crackles t/o on the R. R CT to water seal(it was placed on water seal around noon today). It has minimal amount serous drainage and an air leak with coughing. There is crepitus around CT insertion site. Skin warm and dry.  HR-75, BP-140/80, RR-25, SpO2-98% on 5L [place]  Interventions:  Duoneb PCXR-Slight increase in right-sided pneumothorax. Pigtail catheter is noted in place. CT placed back to suction Plan of Care:  Pt says he feels better after interventions. He is back on 2L [place]-96%. Keep CT to suction. Continue to monitor pt closely. Please call RRT if further assistance needed.  Event Summary:   MD Notified: Dr. Keturah notified by bedside RN PTA RRT Call Time:2257 Arrival Time:2300 End Upfz:[id number]  Tish Graeme Piety, RN

## 2024-08-30 NOTE — Progress Notes (Signed)
 PROGRESS NOTE    Vincent Meyers  FMW:[id number] DOB: May 22, 1946 DOA: 08/29/2024 PCP: [name], NP    Brief Narrative:  78 year old with history of ILD, COPD, OSA, HTN, HLD, DM2 comes to the ED with shortness of breath.  Chest x-ray showed large right-sided spontaneous pneumothorax requiring chest tube placement.  Pulmonary team has been consulted.   Assessment & Plan:  Acute respiratory distress secondary to right-sided large pneumothorax - Chest tube placed by EDP, pulmonary team has been consulted.  After placing chest tube pneumothorax is essentially resolved and this morning chest x-ray appears to be stable.  Continue incentive spirometer, supportive care, supplemental oxygen.  History of ILD COPD - Follows with Dr. Bubba on home pirfenidone , bronchodilators  Essential hypertension - On amlodipine ,  HCTZ, lisinopril .  IV as needed  Hyperlipidemia - Statin  CAD/PAD - Aspirin and statin.  Currently chest pain-free  GERD - PPI  DM2 - Sliding scale and Accu-Cheks    DVT prophylaxis: heparin  injection 5,000 Units Start: 08/30/24 0600 Place and maintain sequential compression device Start: 08/29/24 1443      Code Status: Full Code Family Communication:   Status is: Inpatient Remains inpatient appropriate because: Ongoing management for pneumothorax Continue hospital stay until cleared by pulmonary team.  PT Follow up Recs:   Subjective: Doing okay.  Pain at chest tube insertion site.   Examination:  General exam: Appears calm and comfortable, elderly frail Respiratory system: Clear to auscultation. Respiratory effort normal. Cardiovascular system: S1 & S2 heard, RRR. No JVD, murmurs, rubs, gallops or clicks. No pedal edema. Gastrointestinal system: Abdomen is nondistended, soft and nontender. No organomegaly or masses felt. Normal bowel sounds heard. Central nervous system: Alert and oriented. No focal neurological deficits. Extremities: Symmetric  5 x 5 power. Skin: No rashes, lesions or ulcers Psychiatry: Judgement and insight appear normal. Mood & affect appropriate. Chest tube in place               Diet Orders (From admission, onward)     Start     Ordered   08/29/24 1349  Diet heart healthy/carb modified Room service appropriate? Yes; Fluid consistency: Thin  Diet effective now       Question Answer Comment  Diet-HS Snack? Nothing   Room service appropriate? Yes   Fluid consistency: Thin      08/29/24 1350            Objective: Vitals:   08/30/24 0530 08/30/24 0837 08/30/24 0925 08/30/24 1100  BP: (!) 146/65 (!) 157/76  120/62  Pulse: 70 84  68  Resp: 12 16  15   Temp:   98.2 F (36.8 C)   TempSrc:   Oral   SpO2: 96% 94%  96%    Intake/Output Summary (Last 24 hours) at 08/30/2024 1227 Last data filed at 08/29/2024 1540 Gross per 24 hour  Intake --  Output 50 ml  Net -50 ml   There were no vitals filed for this visit.  Scheduled Meds:  amLODipine   5 mg Oral Daily   aspirin EC  81 mg Oral Daily   atenolol   75 mg Oral Daily   fluticasone   2 spray Each Nare Daily   heparin   5,000 Units Subcutaneous Q8H   hydrochlorothiazide   25 mg Oral Daily   levalbuterol  0.63 mg Nebulization Q6H   lisinopril   40 mg Oral Daily   loratadine  10 mg Oral Daily   pantoprazole  40 mg Oral Daily   Pirfenidone   801  mg Oral TID WC   rosuvastatin   10 mg Oral Daily   sodium chloride  flush  10 mL Intrapleural Q8H   Continuous Infusions:  Nutritional status     There is no height or weight on file to calculate BMI.  Data Reviewed:   CBC: Recent Labs  Lab 08/29/24 1000 08/30/24 0555  WBC 12.7* 14.8*  HGB 15.4 14.2  HCT 47.5 42.5  MCV 82.2 81.6  PLT 205 169   Basic Metabolic Panel: Recent Labs  Lab 08/29/24 1000 08/30/24 0555  NA 130* 126*  K 4.3 4.2  CL 93* 92*  CO2 25 25  GLUCOSE 235* 149*  BUN 8 10  CREATININE 0.53* 0.58*  CALCIUM  8.8* 8.5*   GFR: Estimated Creatinine Clearance: 84.1  mL/min (A) (by C-G formula based on SCr of 0.58 mg/dL (L)). Liver Function Tests: Recent Labs  Lab 08/30/24 0555  AST 23  ALT 19  ALKPHOS 47  BILITOT 0.9  PROT 6.1*  ALBUMIN 3.2*   No results for input(s): LIPASE, AMYLASE in the last 168 hours. No results for input(s): AMMONIA in the last 168 hours. Coagulation Profile: No results for input(s): INR, PROTIME in the last 168 hours. Cardiac Enzymes: No results for input(s): CKTOTAL, CKMB, CKMBINDEX, TROPONINI in the last 168 hours. BNP (last 3 results) No results for input(s): PROBNP in the last 8760 hours. HbA1C: No results for input(s): HGBA1C in the last 72 hours. CBG: No results for input(s): GLUCAP in the last 168 hours. Lipid Profile: No results for input(s): CHOL, HDL, LDLCALC, TRIG, CHOLHDL, LDLDIRECT in the last 72 hours. Thyroid Function Tests: No results for input(s): TSH, T4TOTAL, FREET4, T3FREE, THYROIDAB in the last 72 hours. Anemia Panel: No results for input(s): VITAMINB12, FOLATE, FERRITIN, TIBC, IRON, RETICCTPCT in the last 72 hours. Sepsis Labs: No results for input(s): PROCALCITON, LATICACIDVEN in the last 168 hours.  Recent Results (from the past 240 hours)  Resp panel by RT-PCR (RSV, Flu A&B, Covid) Anterior Nasal Swab     Status: None   Collection Time: 08/29/24 12:48 PM   Specimen: Anterior Nasal Swab  Result Value Ref Range Status   SARS Coronavirus 2 by RT PCR NEGATIVE NEGATIVE Final   Influenza A by PCR NEGATIVE NEGATIVE Final   Influenza B by PCR NEGATIVE NEGATIVE Final    Comment: (NOTE) The Xpert Xpress SARS-CoV-2/FLU/RSV plus assay is intended as an aid in the diagnosis of influenza from Nasopharyngeal swab specimens and should not be used as a sole basis for treatment. Nasal washings and aspirates are unacceptable for Xpert Xpress SARS-CoV-2/FLU/RSV testing.  Fact Sheet for  Patients: BloggerCourse.com  Fact Sheet for Healthcare Providers: SeriousBroker.it  This test is not yet approved or cleared by the [place]  FDA and has been authorized for detection and/or diagnosis of SARS-CoV-2 by FDA under an Emergency Use Authorization (EUA). This EUA will remain in effect (meaning this test can be used) for the duration of the COVID-19 declaration under Section 564(b)(1) of the Act, 21 U.S.C. section 360bbb-3(b)(1), unless the authorization is terminated or revoked.     Resp Syncytial Virus by PCR NEGATIVE NEGATIVE Final    Comment: (NOTE) Fact Sheet for Patients: BloggerCourse.com  Fact Sheet for Healthcare Providers: SeriousBroker.it  This test is not yet approved or cleared by the [place]  FDA and has been authorized for detection and/or diagnosis of SARS-CoV-2 by FDA under an Emergency Use Authorization (EUA). This EUA will remain in effect (meaning this test can be used) for the duration  of the COVID-19 declaration under Section 564(b)(1) of the Act, 21 U.S.C. section 360bbb-3(b)(1), unless the authorization is terminated or revoked.  Performed at [facility], [address], [place], [place] [address]          Radiology Studies: DG Chest Port 1 View Result Date: 08/30/2024 EXAM: 1 VIEW(S) XRAY OF THE CHEST 08/30/2024 07:07:00 AM COMPARISON: 08/29/2024 CLINICAL HISTORY: Chest tube in place [id number]. Right chest tube; rover. FINDINGS: LINES, TUBES AND DEVICES: Right pigtail catheter in place in mid right hemithorax. LUNGS AND PLEURA: Stable trace right apical pneumothorax. Mildly decreased lung volumes. Stable moderate lower lung interstitial thickening. No focal pulmonary opacity. No pulmonary edema. No pleural effusion. HEART AND MEDIASTINUM: No acute abnormality of the cardiac and mediastinal silhouettes. BONES AND SOFT TISSUES: No  acute osseous abnormality. IMPRESSION: 1. Stable trace right apical pneumothorax. 2. Right pigtail catheter in place in the mid right hemithorax. Electronically signed by: Waddell Calk MD 08/30/2024 07:26 AM EDT RP Workstation: [id number]   DG Chest Portable 1 View Result Date: 08/29/2024 CLINICAL DATA:  Chest tube insertion. EXAM: PORTABLE CHEST 1 VIEW COMPARISON:  08/29/2024 at 1222 FINDINGS: Right chest tube is coiled in the lateral aspect of the right chest. Right pneumothorax has completely resolved. Again noted are prominent interstitial markings in both lungs compatible with chronic changes. Slightly decreased lung volumes. The trachea is midline. Heart size is normal limits. IMPRESSION: 1. Right chest tube in place.  Right pneumothorax has resolved. 2. Chronic lung changes. Electronically Signed   By: Juliene Balder M.D.   On: 08/29/2024 15:29   DG Chest Port 1 View Result Date: 08/29/2024 CLINICAL DATA:  Chest tube removal. EXAM: PORTABLE CHEST 1 VIEW COMPARISON:  08/29/2024 at 1222 hours. FINDINGS: Trachea is midline. Heart size stable. Right pneumothorax is increased in size, right pneumothorax has increased, now moderate in size, status post chest tube removal. Coarsened interstitial markings, better evaluated on CT chest 12/06/2023. IMPRESSION: 1. Moderate right pneumothorax, increased from earlier today, status post right chest tube removal. 2. Coarsened pulmonary markings, indicative of interstitial lung disease, characterized as usual interstitial pneumonitis on 12/06/2023 CT chest. Electronically Signed   By: Newell Eke M.D.   On: 08/29/2024 14:50   DG Chest Portable 1 View Result Date: 08/29/2024 CLINICAL DATA:  Chest tube insertion. EXAM: PORTABLE CHEST 1 VIEW COMPARISON:  08/29/2024 FINDINGS: Pigtail right chest tube has been placed. The tube is reconstituted along the lateral aspect of the right chest. The right pneumothorax has significantly decreased in size. Residual 10%  pneumothorax. Patchy densities in the right lung and densities at the left lung base most likely related to atelectasis. Patient also has chronic lung changes. Trachea is midline. Heart size is grossly stable. IMPRESSION: 1. Placement of right chest tube with significant decrease in the right pneumothorax. Residual 10% right pneumothorax. 2. Patchy densities in the right lung and left lung base. Findings are most compatible with atelectasis. Electronically Signed   By: Juliene Balder M.D.   On: 08/29/2024 13:04   DG Chest 2 View Result Date: 08/29/2024 CLINICAL DATA:  Shortness of breath. Decreased oxygen saturations. History of pulmonary fibrosis. EXAM: CHEST - 2 VIEW COMPARISON:  08/22/2024 FINDINGS: Patient has developed a large right pneumothorax measuring greater than 50%. Significant volume loss in the right lung. Prominent interstitial densities in left lung compatible with chronic interstitial lung disease and volume loss. Trachea remains midline. Cardiac silhouette is grossly stable but poorly characterized on this examination. No large pleural effusion.  Bony thorax appears intact. No obvious displaced rib fracture. IMPRESSION: 1. New large right pneumothorax. 2. Chronic interstitial lung disease. These results were called by telephone at the time of interpretation on 08/29/2024 at 10:51 am to provider [facility] , who verbally acknowledged these results. Electronically Signed   By: Juliene Balder M.D.   On: 08/29/2024 10:53           LOS: 1 day   Time spent= 35 mins    Burgess JAYSON Dare, MD [facility]  If 7PM-7AM, please contact night-coverage  08/30/2024, 12:27 PM

## 2024-08-30 NOTE — Significant Event (Signed)
 Notified by RN of increased WOB, desaturation requiring escalation in o2 from 2L -> 5L. Was back down to 2L once I saw him. Exam rales bilaterally, intact breath sounds bilaterally. CXR reveals progressive expansion of his pneumothorax since he was placed to water seal. Advised RRT RN to place back on suction via canister. And I called and spoke with Dr. Maree who in agreement with management. Has another CXR ordered for the morning.   Dorn Dawson, MD  [facility]

## 2024-08-30 NOTE — Consult Note (Addendum)
 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  Initial chest x-ray was completed showing large right pneumothorax and chronic interstitial lung disease changes.  Right chest tube was placed by ED staff.  TRH consulted for admission and PCCM consulted for assistance in chest tube management.   Upon initial assessment of patient within ED, patient hemodynamically stable with some hypertension notable.  Patient on 4 L nasal cannula with O2 sats 94 to 95%, respiratory rate low 20s. Patient reports that had similar pain/discomfort over a week ago but went away. Patient noticed chest pain and eventually SOB development after heavy lifting yesterday afternoon. SOB progressively got worse with exertion. Patient denies dizziness, vertigo, syncope, hemoptysis, fevers, chills, sick contacts. Patient endorses slight increase in cough after incident began to occur.  Upon assessing the [place] drain system of the chest tube-noticeable airleak was evaluated from and upon assessing chest tube site, chest tube was completely out chest.  Immediately called for stat chest x-ray to come to be completed, and placement of split gauze dressing.  Repeat chest x-ray showing significant pneumothorax on right side.  Discussed with patient and wife about emergent placement of second chest tube.  Chest tube placed more anteriorly. Right lung re-expansion noted.       Pertinent  Medical History   Past Medical  History:  Diagnosis Date   Acid reflux    Angina pectoris    Anxiety    Arthritis    Coronary artery disease    Enlarged prostate    per [facility] new patient packet   Erectile dysfunction    High blood sugar    per [facility] new patient packet   Hyperlipidemia    Hypertension    IBS (irritable bowel syndrome)    ILD (interstitial lung disease) (HCC)    OSA (obstructive sleep apnea)    Pulmonary fibrosis (HCC)    per [facility] new patient packet     Significant Hospital Events: Including procedures, antibiotic start and stop dates in addition to other pertinent events   10/8 Pulm Consult for Right Pneumo, Hx of ILD  10/9 Persistent airleak, small apical pneumo on CXR   Interim History / Subjective:  Patient stable, on 4 LNC. Chest tube placed in ED- out upon assessment- sutures not secure  -Post Chest X-ray showing pneumothorax  Patient states that shortness of breath and pain improved today.   Objective    Blood pressure 120/62, pulse 68, temperature 98.2 F (36.8 C), temperature source Oral, resp. rate 15, SpO2 96%.        Intake/Output Summary (Last 24 hours) at 08/30/2024 1205 Last data filed at 08/29/2024 1540 Gross per 24 hour  Intake --  Output 50 ml  Net -50 ml   There were no vitals filed for this visit.  Examination: General: elderly male, obese, no distress  HEENT: Normocephalic, PERRLA intact, missing teeth, poor dentition, wears glasses  CV: s1,s2, RRR, no MRG, No JVD  pulm: diminished on the right side, chest tube with clean  dressing on it  Abs: bs active, soft  Extremities: no edema, no deformity, moves all extremities on command  Skin: no rash  Neuro: Rass 0, alert oriented x 4  GU: deferred   Resolved problem list   Assessment and Plan  Spontaneous pneumothorax secondary to ILD -MD Ramaswamy outpatient for ILD/pulmonary fibrosis -Per patient, has been on 2 L of nasal cannula chronically for approximately 2 years - Continues to have an airleak today. Lung  appears expanded, will trial on waterseal and can reassess airleak tomorrow. Repeat CXR in 4 hours while on waterseal and tomorrow morning.     History of COPD/OSA on CPAP Chronically on 2 L nasal cannula - Patient had an original right chest tube placed for large pneumothorax, however chest tube not secured effectively, requiring second emergent placement of right chest tube.  P: Place on waterseal , persistent airleak Hold off CPAP for now Hold off incentive spirometer as well Continue weaning oxygen down back to baseline, O2 sat goal greater than 88% Repeat chest x-ray in a.m. Follow chest tube order set protocol   Other problems managed by primary: Hypertension Hyperlipidemia Anxiety Diabetes type 2 CAD Acid reflux   Labs   CBC: Recent Labs  Lab 08/29/24 1000 08/30/24 0555  WBC 12.7* 14.8*  HGB 15.4 14.2  HCT 47.5 42.5  MCV 82.2 81.6  PLT 205 169    Basic Metabolic Panel: Recent Labs  Lab 08/29/24 1000 08/30/24 0555  NA 130* 126*  K 4.3 4.2  CL 93* 92*  CO2 25 25  GLUCOSE 235* 149*  BUN 8 10  CREATININE 0.53* 0.58*  CALCIUM  8.8* 8.5*   GFR: Estimated Creatinine Clearance: 84.1 mL/min (A) (by C-G formula based on SCr of 0.58 mg/dL (L)). Recent Labs  Lab 08/29/24 1000 08/30/24 0555  WBC 12.7* 14.8*    Liver Function Tests: Recent Labs  Lab 08/30/24 0555  AST 23  ALT 19  ALKPHOS 47  BILITOT 0.9  PROT 6.1*  ALBUMIN 3.2*   No results for input(s): LIPASE, AMYLASE in the last 168 hours. No results for input(s): AMMONIA in the last 168 hours.  ABG    Component Value Date/Time   HCO3 28.0 09/09/2023 1319   TCO2 29 09/09/2023 1319   O2SAT 76 09/09/2023 1319     Coagulation Profile: No results for input(s): INR, PROTIME in the last 168 hours.  Cardiac Enzymes: No results for input(s): CKTOTAL, CKMB, CKMBINDEX, TROPONINI in the last 168 hours.  HbA1C: Hgb A1c MFr Bld  Date/Time Value Ref Range Status  06/11/2024  02:59 PM 6.6 (H) <5.7 % Final    Comment:    For someone without known diabetes, a hemoglobin A1c value of 6.5% or greater indicates that they may have  diabetes and this should be confirmed with a follow-up  test. . For someone with known diabetes, a value <7% indicates  that their diabetes is well controlled and a value  greater than or equal to 7% indicates suboptimal  control. A1c targets should be individualized based on  duration of diabetes, age, comorbid conditions, and  other considerations. . Currently, no consensus exists regarding use of hemoglobin A1c for diagnosis of diabetes for children. SABRA   03/02/2024 03:19 PM 7.1 (H) <5.7 % of total Hgb Final    Comment:    For someone without known diabetes, a hemoglobin A1c value of 6.5% or greater indicates that they may have  diabetes and this should be confirmed with a follow-up  test. .  For someone with known diabetes, a value <7% indicates  that their diabetes is well controlled and a value  greater than or equal to 7% indicates suboptimal  control. A1c targets should be individualized based on  duration of diabetes, age, comorbid conditions, and  other considerations. . Currently, no consensus exists regarding use of hemoglobin A1c for diagnosis of diabetes for children. .     CBG: No results for input(s): GLUCAP in the last 168 hours.  Review of Systems:   Review of Systems  Constitutional: Negative.   HENT: Negative.    Eyes: Negative.   Respiratory:  Positive for cough and shortness of breath.   Cardiovascular: Negative.   Gastrointestinal: Negative.   Genitourinary: Negative.   Musculoskeletal: Negative.   Skin: Negative.   Neurological: Negative.   Endo/Heme/Allergies: Negative.   Psychiatric/Behavioral: Negative.       Past Medical History:  He,  has a past medical history of Acid reflux, Angina pectoris, Anxiety, Arthritis, Coronary artery disease, Enlarged prostate, Erectile dysfunction,  High blood sugar, Hyperlipidemia, Hypertension, IBS (irritable bowel syndrome), ILD (interstitial lung disease) (HCC), OSA (obstructive sleep apnea), and Pulmonary fibrosis (HCC).   Surgical History:   Past Surgical History:  Procedure Laterality Date   APPENDECTOMY     COLON SURGERY     perforated bowel and hernia repair   COLONOSCOPY  2024   per [facility] new patient packet   COLONOSCOPY N/A 05/17/2024   Procedure: COLONOSCOPY;  Surgeon: Legrand Victory LITTIE DOUGLAS, MD;  Location: [facility] ENDOSCOPY;  Service: Gastroenterology;  Laterality: N/A;   HEMOSTASIS CLIP PLACEMENT  05/17/2024   Procedure: CONTROL OF HEMORRHAGE, GI TRACT, ENDOSCOPIC, BY CLIPPING OR OVERSEWING;  Surgeon: Legrand Victory LITTIE DOUGLAS, MD;  Location: [facility] ENDOSCOPY;  Service: Gastroenterology;;   ILIAC ARTERY ANEURYSM REPAIR Left 2006   Dr. Oris (redo surgery)   POLYPECTOMY  05/17/2024   Procedure: POLYPECTOMY, INTESTINE;  Surgeon: Legrand Victory LITTIE DOUGLAS, MD;  Location: [facility] ENDOSCOPY;  Service: Gastroenterology;;   REPLACEMENT TOTAL KNEE Left 2008   Dr.Yeats, per [facility] new patient packet   REPLACEMENT TOTAL KNEE Right 2018   Dr.J, per [facility] new patient packet   REPLACEMENT TOTAL KNEE BILATERAL     RIGHT HEART CATH N/A 09/09/2023   Procedure: RIGHT HEART CATH;  Surgeon: Rolan Ezra RAMAN, MD;  Location: [facility] INVASIVE CV LAB;  Service: Cardiovascular;  Laterality: N/A;   TONSILLECTOMY     age 17     Social History:   reports that he quit smoking about 36 years ago. His smoking use included cigarettes. He started smoking about 59 years ago. He has a 46 pack-year smoking history. He has been exposed to tobacco smoke. He has never used smokeless tobacco. He reports that he does not currently use alcohol after a past usage of about 4.0 - 5.0 standard drinks of alcohol per week. He reports that he does not use drugs.   Family History:  His family history includes Alzheimer's disease in his father and sister; Heart attack in his father; Heart murmur in his  daughter; Hypertension in his daughter; Obesity in his daughter; Other in his sister; Parkinson's disease in his mother; Stroke in his father. There is no history of Colon cancer, Esophageal cancer, Rectal cancer, or Stomach cancer.   Allergies No Known Allergies   Home Medications  Prior to Admission medications   Medication Sig Start Date End Date Taking? Authorizing Provider  amLODipine  (NORVASC ) 5 MG tablet TAKE 1 TABLET (5 MG TOTAL) BY MOUTH  DAILY. 05/28/24 05/23/25  [name], NP  aspirin EC 81 MG tablet Take 81 mg by mouth daily.    [provider]  atenolol  (TENORMIN ) 50 MG tablet Take 1.5 tablets (75 mg total) by mouth daily. 06/07/24   Fargo, Amy E, NP  Coenzyme Q10 (COQ10 PO) Take 500 mg by mouth daily.    [provider]  FIBER PO Take 4 capsules by mouth 2 (two) times daily.    [provider]  Flaxseed, Linseed, (FLAXSEED OIL PO) Take 1 capsule by mouth daily.    [provider]  fluticasone  (FLONASE ) 50 MCG/ACT nasal spray Place 2 sprays into both nostrils daily. 12/27/22   [provider]  hydrochlorothiazide  (HYDRODIURIL ) 25 MG tablet TAKE 1 TABLET BY MOUTH EVERY DAY 04/04/24   Eubanks, Jessica K, NP  lisinopril  (ZESTRIL ) 40 MG tablet Take 1 tablet (40 mg total) by mouth daily. 06/07/24   Fargo, Amy E, NP  loratadine (CLARITIN) 10 MG tablet Take 10 mg by mouth daily.    [provider]  MAGNESIUM-POTASSIUM PO Take 1 tablet by mouth daily. With zinc Patient not taking: Reported on 06/11/2024    [provider]  naproxen (NAPROSYN) 500 MG tablet Take 1 tablet (500 mg total) by mouth 2 (two) times daily. 08/22/24   Bauer, Collin S, PA-C  omeprazole  (PRILOSEC) 20 MG capsule TAKE 1 CAPSULE BY MOUTH EVERY DAY 08/22/24   Eubanks, Jessica K, NP  Pirfenidone  801 MG TABS Take 1 tablet (801 mg total) by mouth 3 (three) times daily with meals. 06/08/24   Geronimo Amel, MD  rosuvastatin  (CRESTOR ) 10 MG tablet TAKE 1 TABLET BY  MOUTH EVERY DAY 04/27/24   Croitoru, Mihai, MD  tadalafil  (CIALIS ) 20 MG tablet Take 1 tablet (20 mg total) by mouth daily as needed. 08/22/23   Stoneking, Adine PARAS., MD  TURMERIC-GINGER PO Take 2,600 mg by mouth daily.    [provider]  UNABLE TO FIND Med Name: Taking experimental drug or possible placeb for lungs, managed by [facility] [facility] with [place]    [provider]     Critical care time: 60 mins    Christian Smith AGACNP-BC   [facility] 08/30/2024, 12:05 PM  Please see [email or web address] for pager details.  From 7A-7P if no response, please call [phone]. After hours, please call ELink [phone].

## 2024-08-31 ENCOUNTER — Inpatient Hospital Stay (HOSPITAL_COMMUNITY)

## 2024-08-31 DIAGNOSIS — J939 Pneumothorax, unspecified: Secondary | ICD-10-CM | POA: Diagnosis not present

## 2024-08-31 LAB — MAGNESIUM: Magnesium: 1.7 mg/dL (ref 1.7–2.4)

## 2024-08-31 LAB — CBC
HCT: 44 % (ref 39.0–52.0)
Hemoglobin: 14.6 g/dL (ref 13.0–17.0)
MCH: 26.7 pg (ref 26.0–34.0)
MCHC: 33.2 g/dL (ref 30.0–36.0)
MCV: 80.6 fL (ref 80.0–100.0)
Platelets: 187 K/uL (ref 150–400)
RBC: 5.46 MIL/uL (ref 4.22–5.81)
RDW: 13 % (ref 11.5–15.5)
WBC: 14.8 K/uL — ABNORMAL HIGH (ref 4.0–10.5)
nRBC: 0 % (ref 0.0–0.2)

## 2024-08-31 LAB — BASIC METABOLIC PANEL WITH GFR
Anion gap: 10 (ref 5–15)
BUN: 10 mg/dL (ref 8–23)
CO2: 25 mmol/L (ref 22–32)
Calcium: 8.5 mg/dL — ABNORMAL LOW (ref 8.9–10.3)
Chloride: 91 mmol/L — ABNORMAL LOW (ref 98–111)
Creatinine, Ser: 0.55 mg/dL — ABNORMAL LOW (ref 0.61–1.24)
GFR, Estimated: >60 mL/min (ref >60)
Glucose, Bld: 141 mg/dL — ABNORMAL HIGH (ref 70–99)
Potassium: 3.8 mmol/L (ref 3.5–5.1)
Sodium: 126 mmol/L — ABNORMAL LOW (ref 135–145)

## 2024-08-31 MED ORDER — PSYLLIUM 95 % PO PACK
1.0000 | PACK | Freq: Every day | ORAL | Status: DC
  Administered 2024-08-31 – 2024-09-07 (×8): 1 via ORAL
  Filled 2024-08-31 (×9): qty 1

## 2024-08-31 MED ORDER — SODIUM CHLORIDE 0.9 % IV SOLN: INTRAVENOUS | Status: AC

## 2024-08-31 NOTE — TOC CM/SW Note (Signed)
 Transition of Care [facility]) - Inpatient Brief Assessment   Patient Details  Name: Vincent Meyers MRN: [id number] Date of Birth: 1946/06/12  [facility] CM/SW Contact:    Waddell Barnie Rama, RN Phone Number: 08/31/2024, 3:51 PM   Clinical Narrative: From home with spouse, has PCP and insurance on file, states has no HH services in place at this time , has home oxygen 2.5 to 3 liters with adapt and rollator at home.  States family member (wife)  will transport them home at [employer] and family is support system, states gets medications from [facility] on [address].  Pta self ambulatory.   Here with R PTX, chest tube to suction.    Transition of Care Asessment: Insurance and Status: Insurance coverage has been reviewed Patient has primary care physician: Yes Home environment has been reviewed: home with wife Prior level of function:: indep Prior/Current Home Services: Current home services (home oxygen 2.5 - 3 liters with adapt , rollator) Social Drivers of Health Review: SDOH reviewed no interventions necessary Readmission risk has been reviewed: Yes Transition of care needs: no transition of care needs at this time

## 2024-08-31 NOTE — Progress Notes (Signed)
 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  Initial chest x-ray was completed showing large right pneumothorax and chronic interstitial lung disease changes.  Right chest tube was placed by ED staff.  TRH consulted for admission and PCCM consulted for assistance in chest tube management.   Upon initial assessment of patient within ED, patient hemodynamically stable with some hypertension notable.  Patient on 4 L nasal cannula with O2 sats 94 to 95%, respiratory rate low 20s. Patient reports that had similar pain/discomfort over a week ago but went away. Patient noticed chest pain and eventually SOB development after heavy lifting yesterday afternoon. SOB progressively got worse with exertion. Patient denies dizziness, vertigo, syncope, hemoptysis, fevers, chills, sick contacts. Patient endorses slight increase in cough after incident began to occur.  Upon assessing the [place] drain system of the chest tube-noticeable airleak was evaluated from and upon assessing chest tube site, chest tube was completely out chest.  Immediately called for stat chest x-ray to come to be completed, and placement of split gauze dressing.  Repeat chest x-ray showing significant pneumothorax on right side.  Discussed with patient and wife about emergent placement of second chest tube.  Chest tube placed more anteriorly. Right lung re-expansion noted.   Overnight he did not tolerate waterseal and had  pneumothorax reexpansion. He has a persistent airleak    Pertinent  Medical History   Past Medical History:  Diagnosis Date   Acid reflux    Angina pectoris    Anxiety    Arthritis    Coronary artery disease    Enlarged prostate    per [facility] new patient packet   Erectile dysfunction    High blood sugar    per [facility] new patient packet   Hyperlipidemia    Hypertension    IBS (irritable bowel syndrome)    ILD (interstitial lung disease) (HCC)    OSA (obstructive sleep apnea)    Pulmonary fibrosis (HCC)    per [facility] new patient packet     Significant Hospital Events: Including procedures, antibiotic start and stop dates in addition to other pertinent events   10/8 Pulm Consult for Right Pneumo, Hx of ILD  10/9 Persistent airleak, small apical pneumo on CXR   Interim History / Subjective:  Patient stable, on 4 LNC. Chest tube placed in ED- out upon assessment- sutures not secure  -Post Chest X-ray showing pneumothorax - Overnight back on suction     Objective    Blood pressure (!) 145/66, pulse 71, temperature 98.4 F (36.9 C), temperature source Oral, resp. rate (!) 21, SpO2 98%.    FiO2 (%):  [28 %] 28 %   Intake/Output Summary (Last 24 hours) at 08/31/2024 1004 Last data filed at 08/31/2024 0613 Gross per 24 hour  Intake 480 ml  Output 1200 ml  Net -720 ml   There were no vitals filed for this visit.  Examination: General:  Elderly male, not in distress, obese  HEENT: Normocephalic, AT/[place] CV:  S1,S2 RRR pulm: diminished  on the right side, chest tube with clean dressing on it  Abs: bs active, soft  Extremities: no edema, no deformity, moves all extremities on command  Skin: no rash  Neuro: Rass 0, alert oriented x 4  GU: deferred  Resolved problem list   Assessment and Plan  Spontaneous pneumothorax secondary to ILD -MD Ramaswamy outpatient for ILD/pulmonary fibrosis -Per patient, has been on 2 L of nasal cannula chronically for approximately 2 years -  Continues to have an airleak today. Lung appears expanded, will trial on waterseal and can reassess airleak tomorrow. Repeat CXR in 4 hours while on waterseal and tomorrow morning.    History of COPD/OSA on CPAP Chronically on 2 L nasal cannula - Patient had an original right chest tube placed for large pneumothorax, however chest tube not secured effectively, requiring second emergent placement of right chest tube. He has a persistent air leak and is currently on suction, did not tolerate wasterseal  P: Back to suction, will keep on suction for today and try water seal again tomorrow Hold off CPAP for now Hold off incentive spirometer as well Continue weaning oxygen down back to baseline, O2 sat goal greater than 88% Repeat chest x-ray in a.m. Follow chest tube order set protocol   Other problems managed by primary: Hypertension Hyperlipidemia Anxiety Diabetes type 2 CAD Acid reflux   Labs   CBC: Recent Labs  Lab 08/29/24 1000 08/30/24 0555 08/31/24 0258  WBC 12.7* 14.8* 14.8*  HGB 15.4 14.2 14.6  HCT 47.5 42.5 44.0  MCV 82.2 81.6 80.6  PLT 205 169 187    Basic Metabolic Panel: Recent Labs  Lab 08/29/24 1000 08/30/24 0555 08/31/24 0258  NA 130* 126* 126*  K 4.3 4.2 3.8  CL 93* 92* 91*  CO2 25 25 25   GLUCOSE 235* 149* 141*  BUN 8 10 10   CREATININE 0.53* 0.58* 0.55*  CALCIUM  8.8* 8.5* 8.5*  MG  --   --  1.7   GFR: Estimated Creatinine Clearance: 84.1 mL/min (A) (by C-G formula based on SCr of 0.55 mg/dL (L)). Recent Labs  Lab 08/29/24 1000 08/30/24 0555 08/31/24 0258  WBC 12.7* 14.8* 14.8*    Liver Function Tests: Recent Labs  Lab 08/30/24 0555  AST 23  ALT 19  ALKPHOS 47  BILITOT 0.9  PROT 6.1*  ALBUMIN 3.2*   No results for input(s): LIPASE, AMYLASE in the last 168 hours. No results for input(s): AMMONIA in the last 168 hours.  ABG    Component Value Date/Time   HCO3 28.0 09/09/2023 1319   TCO2 29 09/09/2023 1319   O2SAT 76  09/09/2023 1319     Coagulation Profile: No results for input(s): INR, PROTIME in the last 168 hours.  Cardiac Enzymes: No results for input(s): CKTOTAL, CKMB, CKMBINDEX, TROPONINI in the last 168 hours.  HbA1C: Hgb A1c MFr Bld  Date/Time Value Ref Range Status  06/11/2024 02:59 PM 6.6 (H) <5.7 % Final    Comment:    For someone without known diabetes, a hemoglobin A1c value of 6.5% or greater indicates that they may have  diabetes and this should be confirmed with a follow-up  test. . For someone with known diabetes, a value <7% indicates  that their diabetes is well controlled and a value  greater than or equal to 7% indicates suboptimal  control. A1c targets should be individualized based on  duration of diabetes, age, comorbid conditions, and  other considerations. . Currently, no consensus exists regarding use of  hemoglobin A1c for diagnosis of diabetes for children. SABRA   03/02/2024 03:19 PM 7.1 (H) <5.7 % of total Hgb Final    Comment:    For someone without known diabetes, a hemoglobin A1c value of 6.5% or greater indicates that they may have  diabetes and this should be confirmed with a follow-up  test. . For someone with known diabetes, a value <7% indicates  that their diabetes is well controlled and a value  greater than or equal to 7% indicates suboptimal  control. A1c targets should be individualized based on  duration of diabetes, age, comorbid conditions, and  other considerations. . Currently, no consensus exists regarding use of hemoglobin A1c for diagnosis of diabetes for children. .     CBG: No results for input(s): GLUCAP in the last 168 hours.  Review of Systems:      Past Medical History:  He,  has a past medical history of Acid reflux, Angina pectoris, Anxiety, Arthritis, Coronary artery disease, Enlarged prostate, Erectile dysfunction, High blood sugar, Hyperlipidemia, Hypertension, IBS (irritable bowel syndrome), ILD  (interstitial lung disease) (HCC), OSA (obstructive sleep apnea), and Pulmonary fibrosis (HCC).   Surgical History:   Past Surgical History:  Procedure Laterality Date   APPENDECTOMY     COLON SURGERY     perforated bowel and hernia repair   COLONOSCOPY  2024   per [facility] new patient packet   COLONOSCOPY N/A 05/17/2024   Procedure: COLONOSCOPY;  Surgeon: Legrand Victory LITTIE DOUGLAS, MD;  Location: [facility];  Service: Gastroenterology;  Laterality: N/A;   HEMOSTASIS CLIP PLACEMENT  05/17/2024   Procedure: CONTROL OF HEMORRHAGE, GI TRACT, ENDOSCOPIC, BY CLIPPING OR OVERSEWING;  Surgeon: Legrand Victory LITTIE DOUGLAS, MD;  Location: [facility];  Service: Gastroenterology;;   ILIAC ARTERY ANEURYSM REPAIR Left 2006   Dr. Oris (redo surgery)   POLYPECTOMY  05/17/2024   Procedure: POLYPECTOMY, INTESTINE;  Surgeon: Legrand Victory LITTIE DOUGLAS, MD;  Location: [facility];  Service: Gastroenterology;;   REPLACEMENT TOTAL KNEE Left 2008   Dr.Yeats, per [facility] new patient packet   REPLACEMENT TOTAL KNEE Right 2018   Dr.J, per [facility] new patient packet   REPLACEMENT TOTAL KNEE BILATERAL     RIGHT HEART CATH N/A 09/09/2023   Procedure: RIGHT HEART CATH;  Surgeon: Rolan Ezra RAMAN, MD;  Location: [facility] INVASIVE CV LAB;  Service: Cardiovascular;  Laterality: N/A;   TONSILLECTOMY     age 6     Social History:   reports that he quit smoking about 36 years ago. His smoking use included cigarettes. He started smoking about 59 years ago. He has a 46 pack-year smoking history. He has been exposed to tobacco smoke. He has never used smokeless tobacco. He reports that he does not currently use alcohol after a past usage of about 4.0 - 5.0 standard drinks of alcohol per week. He reports that he does not use drugs.   Family History:  His family history includes Alzheimer's disease in his father and sister; Heart attack in his father; Heart murmur in his daughter; Hypertension in his daughter; Obesity in his daughter; Other in his sister;  Parkinson's disease in his mother; Stroke in his father. There is no history of Colon cancer, Esophageal cancer, Rectal cancer, or Stomach cancer.   Allergies No Known Allergies   Home Medications  Prior to Admission medications   Medication Sig Start Date End Date Taking? Authorizing Provider  amLODipine  (NORVASC ) 5 MG tablet TAKE 1 TABLET (5 MG TOTAL) BY MOUTH DAILY.  05/28/24 05/23/25  [name], NP  aspirin EC 81 MG tablet Take 81 mg by mouth daily.    [provider]  atenolol  (TENORMIN ) 50 MG tablet Take 1.5 tablets (75 mg total) by mouth daily. 06/07/24   Fargo, Amy E, NP  Coenzyme Q10 (COQ10 PO) Take 500 mg by mouth daily.    [provider]  FIBER PO Take 4 capsules by mouth 2 (two) times daily.    [provider]  Flaxseed, Linseed, (FLAXSEED OIL PO) Take 1 capsule by mouth daily.    [provider]  fluticasone  (FLONASE ) 50 MCG/ACT nasal spray Place 2 sprays into both nostrils daily. 12/27/22   [provider]  hydrochlorothiazide  (HYDRODIURIL ) 25 MG tablet TAKE 1 TABLET BY MOUTH EVERY DAY 04/04/24   Eubanks, Jessica K, NP  lisinopril  (ZESTRIL ) 40 MG tablet Take 1 tablet (40 mg total) by mouth daily. 06/07/24   Fargo, Amy E, NP  loratadine (CLARITIN) 10 MG tablet Take 10 mg by mouth daily.    [provider]  MAGNESIUM-POTASSIUM PO Take 1 tablet by mouth daily. With zinc Patient not taking: Reported on 06/11/2024    [provider]  naproxen (NAPROSYN) 500 MG tablet Take 1 tablet (500 mg total) by mouth 2 (two) times daily. 08/22/24   Bauer, Collin S, PA-C  omeprazole  (PRILOSEC) 20 MG capsule TAKE 1 CAPSULE BY MOUTH EVERY DAY 08/22/24   Eubanks, Jessica K, NP  Pirfenidone  801 MG TABS Take 1 tablet (801 mg total) by mouth 3 (three) times daily with meals. 06/08/24   Geronimo Amel, MD  rosuvastatin  (CRESTOR ) 10 MG tablet TAKE 1 TABLET BY MOUTH EVERY DAY 04/27/24   Croitoru, Mihai, MD  tadalafil  (CIALIS ) 20 MG tablet Take 1  tablet (20 mg total) by mouth daily as needed. 08/22/23   Stoneking, Adine PARAS., MD  TURMERIC-GINGER PO Take 2,600 mg by mouth daily.    [provider]  UNABLE TO FIND Med Name: Taking experimental drug or possible placeb for lungs, managed by [employer] with [place]    [provider]      Zola Herter, MD  Pulmonary & Critical Care Office: [phone]   See Amion for personal pager PCCM on call pager [phone] until 7pm. Please call Elink 7p-7a. [phone]

## 2024-08-31 NOTE — Plan of Care (Signed)
  Problem: Education: Goal: Knowledge of General Education information will improve Description: Including pain rating scale, medication(s)/side effects and non-pharmacologic comfort measures Outcome: Progressing   Problem: Clinical Measurements: Goal: Will remain free from infection Outcome: Progressing   Problem: Safety: Goal: Ability to remain free from injury will improve Outcome: Progressing   

## 2024-08-31 NOTE — Evaluation (Signed)
 Occupational Therapy Evaluation Patient Details Name: Vincent Meyers MRN: [id number] DOB: 03-07-1946 Today's Date: 08/31/2024   History of Present Illness   78 year old comes to the ED 10/8 with shortness of breath. Chest x-ray showed large right-sided spontaneous pneumothorax requiring chest tube placement. PMH: ILD, COPD, OSA, HTN, HLD, DM2.     Clinical Impressions Pt admitted based on above, and was seen based on problem list below. PTA pt was independent with ADLs and IADLs. Today pt is at supervision level for ADLs and gait with use of RW. Anticipate pt is close to functional baseline, but would benefit from further acute follow up for further education regarding energy conservation strategies; no follow up OT or DME needs.     If plan is discharge home, recommend the following:   Assistance with cooking/housework     Functional Status Assessment   Patient has had a recent decline in their functional status and demonstrates the ability to make significant improvements in function in a reasonable and predictable amount of time.     Equipment Recommendations   None recommended by OT      Precautions/Restrictions   Precautions Precautions: Fall Recall of Precautions/Restrictions: Intact Precaution/Restrictions Comments: Rt chest tube Restrictions Weight Bearing Restrictions Per Provider Order: No     Mobility Bed Mobility Overal bed mobility: Modified Independent       General bed mobility comments: Increased time and effort    Transfers Overall transfer level: Modified independent Equipment used: Rolling walker (2 wheels)     General transfer comment: good hand placement, ambulated ~315ft with RW      Balance Overall balance assessment: Mild deficits observed, not formally tested       ADL either performed or assessed with clinical judgement   ADL Overall ADL's : Needs assistance/impaired Eating/Feeding: Set up;Sitting   Grooming:  Supervision/safety;Standing           Upper Body Dressing : Supervision/safety;Standing   Lower Body Dressing: Supervision/safety;Sit to/from stand Lower Body Dressing Details (indicate cue type and reason): pt unable to figure 4 RLE for socks, but can bend over to don socks Toilet Transfer: Supervision/safety;Rolling walker (2 wheels)           Functional mobility during ADLs: Supervision/safety General ADL Comments: Close to baseline, mild to mod DOE     Vision Baseline Vision/History: 1 Wears glasses Vision Assessment?: No apparent visual deficits         Extremity/Trunk Assessment Upper Extremity Assessment Upper Extremity Assessment: Overall WFL for tasks assessed   Lower Extremity Assessment Lower Extremity Assessment: Defer to PT evaluation       Communication Communication Communication: No apparent difficulties   Cognition Arousal: Alert Behavior During Therapy: WFL for tasks assessed/performed Cognition: No apparent impairments     OT - Cognition Comments: Pt mildly impulsive but good awareness of self-monitoring of O2 and safety with lines     Following commands: Intact       Cueing  General Comments   Cueing Techniques: Verbal cues  Received on 2L, pt stating at 94%, increased to 3L for mobility lowest O2 at 91%. Left resting on 2L stating at 94%           Home Living Family/patient expects to be discharged to:: Private residence Living Arrangements: Spouse/significant other Available Help at Discharge: Family Type of Home: House Home Access: Stairs to enter [employer] of Steps: 2 Entrance Stairs-Rails: None Home Layout: One level     Bathroom Shower/Tub: Tub/shower unit  Bathroom Toilet: Standard     Home Equipment: Cane - single Acupuncturist (4 wheels);Grab bars - tub/shower   Additional Comments: Baseline 2L O2, uses home pulse ox to self-monitor      Prior Functioning/Environment  Prior Level of Function : Independent/Modified Independent;History of Falls (last six months)     Mobility Comments: ind, mostly no AD, prn use of rollator ADLs Comments: ind. active, works on cars and motorcycles.    OT Problem List: Decreased strength;Cardiopulmonary status limiting activity   OT Treatment/Interventions: Self-care/ADL training;Therapeutic exercise;Energy conservation;Therapeutic activities;Patient/family education;Balance training      OT Goals(Current goals can be found in the care plan section)   Acute Rehab OT Goals Patient Stated Goal: To go home OT Goal Formulation: With patient Time For Goal Achievement: 09/14/24 Potential to Achieve Goals: Good   OT Frequency:  Min 2X/week       AM-PAC OT 6 Clicks Daily Activity     Outcome Measure Help from another person eating meals?: None Help from another person taking care of personal grooming?: A Little Help from another person toileting, which includes using toliet, bedpan, or urinal?: A Little Help from another person bathing (including washing, rinsing, drying)?: A Little Help from another person to put on and taking off regular upper body clothing?: A Little Help from another person to put on and taking off regular lower body clothing?: A Little 6 Click Score: 19   End of Session Equipment Utilized During Treatment: Rolling walker (2 wheels);Oxygen Nurse Communication: Mobility status  Activity Tolerance: Patient tolerated treatment well Patient left: in bed;with call bell/phone within reach;with family/visitor present  OT Visit Diagnosis: Unsteadiness on feet (R26.81);Other abnormalities of gait and mobility (R26.89)                Time: 8693-8662 OT Time Calculation (min): 31 min Charges:  OT General Charges $OT Visit: 1 Visit OT Evaluation $OT Eval Moderate Complexity: 1 Mod  Devanshi Califf C, OT  Acute Rehabilitation Services Office [phone] Secure chat preferred   Adrianne GORMAN Savers 08/31/2024, 1:54 PM

## 2024-08-31 NOTE — Progress Notes (Signed)
 PROGRESS NOTE    Vincent Meyers  FMW:[id number] DOB: 06/26/1946 DOA: 08/29/2024 PCP: [name], NP    Brief Narrative:  78 year old with history of ILD, COPD, OSA, HTN, HLD, DM2 comes to the ED with shortness of breath.  Chest x-ray showed large right-sided spontaneous pneumothorax requiring chest tube placement.  Pulmonary team has been consulted.   Assessment & Plan:  Acute respiratory distress secondary to right-sided large pneumothorax - Chest tube placed by EDP, pulmonary team has been consulted.  Initial improvement after chest tube placement but later had reexpansion of pneumothorax therefore reinitiated wall suction.  Pulmonary team managing.  Will plan on repeating chest x-ray tomorrow morning  History of ILD COPD - Follows with Dr. Bubba on home pirfenidone , bronchodilators  Essential hypertension - On amlodipine ,  HCTZ, lisinopril .  IV as needed  Hyperlipidemia - Statin  CAD/PAD - Aspirin and statin.  Currently chest pain-free  GERD - PPI  DM2 - Sliding scale and Accu-Cheks..-  Hyponatremia - From hypovolemia.  IV fluids    DVT prophylaxis: heparin  injection 5,000 Units Start: 08/30/24 0600 Place and maintain sequential compression device Start: 08/29/24 1443      Code Status: Full Code Family Communication:   Status is: Inpatient Remains inpatient appropriate because: Ongoing management for pneumothorax Continue hospital stay until cleared by pulmonary team.  PT Follow up Recs:   Subjective: Overnight had worsening shortness of breath requiring chest tube to be placed back on suction.  Feels better this morning   Examination:  General exam: Appears calm and comfortable, elderly frail Respiratory system: Clear to auscultation. Respiratory effort normal. Cardiovascular system: S1 & S2 heard, RRR. No JVD, murmurs, rubs, gallops or clicks. No pedal edema. Gastrointestinal system: Abdomen is nondistended, soft and nontender. No  organomegaly or masses felt. Normal bowel sounds heard. Central nervous system: Alert and oriented. No focal neurological deficits. Extremities: Symmetric 5 x 5 power. Skin: No rashes, lesions or ulcers Psychiatry: Judgement and insight appear normal. Mood & affect appropriate. Chest tube in place               Diet Orders (From admission, onward)     Start     Ordered   08/29/24 1349  Diet heart healthy/carb modified Room service appropriate? Yes; Fluid consistency: Thin  Diet effective now       Question Answer Comment  Diet-HS Snack? Nothing   Room service appropriate? Yes   Fluid consistency: Thin      08/29/24 1350            Objective: Vitals:   08/30/24 2315 08/31/24 0445 08/31/24 0727 08/31/24 0816  BP: (!) 140/80 127/80 (!) 145/66   Pulse: 75 70 71   Resp: (!) 21 20 20  (!) 21  Temp: 98.1 F (36.7 C) 97.8 F (36.6 C) 98.4 F (36.9 C)   TempSrc: Oral Oral Oral   SpO2: 100% 94% 98%     Intake/Output Summary (Last 24 hours) at 08/31/2024 1226 Last data filed at 08/31/2024 9386 Gross per 24 hour  Intake 480 ml  Output 1200 ml  Net -720 ml   There were no vitals filed for this visit.  Scheduled Meds:  amLODipine   5 mg Oral Daily   aspirin EC  81 mg Oral Daily   atenolol   75 mg Oral Daily   fluticasone   2 spray Each Nare Daily   heparin   5,000 Units Subcutaneous Q8H   hydrochlorothiazide   25 mg Oral Daily   levalbuterol  0.63  mg Nebulization Q6H   lisinopril   40 mg Oral Daily   loratadine  10 mg Oral Daily   pantoprazole  40 mg Oral Daily   Pirfenidone   801 mg Oral TID WC   psyllium  1 packet Oral Daily   rosuvastatin   10 mg Oral Daily   sodium chloride  flush  10 mL Intrapleural Q8H   Continuous Infusions:  sodium chloride  75 mL/hr at 08/31/24 0937    Nutritional status     There is no height or weight on file to calculate BMI.  Data Reviewed:   CBC: Recent Labs  Lab 08/29/24 1000 08/30/24 0555 08/31/24 0258  WBC 12.7* 14.8*  14.8*  HGB 15.4 14.2 14.6  HCT 47.5 42.5 44.0  MCV 82.2 81.6 80.6  PLT 205 169 187   Basic Metabolic Panel: Recent Labs  Lab 08/29/24 1000 08/30/24 0555 08/31/24 0258  NA 130* 126* 126*  K 4.3 4.2 3.8  CL 93* 92* 91*  CO2 25 25 25   GLUCOSE 235* 149* 141*  BUN 8 10 10   CREATININE 0.53* 0.58* 0.55*  CALCIUM  8.8* 8.5* 8.5*  MG  --   --  1.7   GFR: Estimated Creatinine Clearance: 84.1 mL/min (A) (by C-G formula based on SCr of 0.55 mg/dL (L)). Liver Function Tests: Recent Labs  Lab 08/30/24 0555  AST 23  ALT 19  ALKPHOS 47  BILITOT 0.9  PROT 6.1*  ALBUMIN 3.2*   No results for input(s): LIPASE, AMYLASE in the last 168 hours. No results for input(s): AMMONIA in the last 168 hours. Coagulation Profile: No results for input(s): INR, PROTIME in the last 168 hours. Cardiac Enzymes: No results for input(s): CKTOTAL, CKMB, CKMBINDEX, TROPONINI in the last 168 hours. BNP (last 3 results) No results for input(s): PROBNP in the last 8760 hours. HbA1C: No results for input(s): HGBA1C in the last 72 hours. CBG: No results for input(s): GLUCAP in the last 168 hours. Lipid Profile: No results for input(s): CHOL, HDL, LDLCALC, TRIG, CHOLHDL, LDLDIRECT in the last 72 hours. Thyroid Function Tests: No results for input(s): TSH, T4TOTAL, FREET4, T3FREE, THYROIDAB in the last 72 hours. Anemia Panel: No results for input(s): VITAMINB12, FOLATE, FERRITIN, TIBC, IRON, RETICCTPCT in the last 72 hours. Sepsis Labs: No results for input(s): PROCALCITON, LATICACIDVEN in the last 168 hours.  Recent Results (from the past 240 hours)  Resp panel by RT-PCR (RSV, Flu A&B, Covid) Anterior Nasal Swab     Status: None   Collection Time: 08/29/24 12:48 PM   Specimen: Anterior Nasal Swab  Result Value Ref Range Status   SARS Coronavirus 2 by RT PCR NEGATIVE NEGATIVE Final   Influenza A by PCR NEGATIVE NEGATIVE Final   Influenza B by  PCR NEGATIVE NEGATIVE Final    Comment: (NOTE) The Xpert Xpress SARS-CoV-2/FLU/RSV plus assay is intended as an aid in the diagnosis of influenza from Nasopharyngeal swab specimens and should not be used as a sole basis for treatment. Nasal washings and aspirates are unacceptable for Xpert Xpress SARS-CoV-2/FLU/RSV testing.  Fact Sheet for Patients: BloggerCourse.com  Fact Sheet for Healthcare Providers: SeriousBroker.it  This test is not yet approved or cleared by the [place]  FDA and has been authorized for detection and/or diagnosis of SARS-CoV-2 by FDA under an Emergency Use Authorization (EUA). This EUA will remain in effect (meaning this test can be used) for the duration of the COVID-19 declaration under Section 564(b)(1) of the Act, 21 U.S.C. section 360bbb-3(b)(1), unless the authorization is terminated or revoked.  Resp Syncytial Virus by PCR NEGATIVE NEGATIVE Final    Comment: (NOTE) Fact Sheet for Patients: BloggerCourse.com  Fact Sheet for Healthcare Providers: SeriousBroker.it  This test is not yet approved or cleared by the [place]  FDA and has been authorized for detection and/or diagnosis of SARS-CoV-2 by FDA under an Emergency Use Authorization (EUA). This EUA will remain in effect (meaning this test can be used) for the duration of the COVID-19 declaration under Section 564(b)(1) of the Act, 21 U.S.C. section 360bbb-3(b)(1), unless the authorization is terminated or revoked.  Performed at [facility], [address], [place], [place] [address]          Radiology Studies: DG CHEST PORT 1 VIEW Result Date: 08/31/2024 CLINICAL DATA:  Pneumothorax. EXAM: PORTABLE CHEST 1 VIEW COMPARISON:  August 30, 2024. FINDINGS: Stable cardiomediastinal silhouette. Stable bilateral lung opacities are noted. Right-sided chest tube is again noted.  Right-sided pneumothorax is significantly smaller, although small apical component remains. Bony thorax is unremarkable. IMPRESSION: Right-sided pneumothorax is significantly smaller, although small apical component remains. Electronically Signed   By: Lynwood Landy Raddle M.D.   On: 08/31/2024 08:33   DG CHEST PORT 1 VIEW Result Date: 08/30/2024 CLINICAL DATA:  Follow-up pneumothorax increased shortness of breath EXAM: PORTABLE CHEST 1 VIEW COMPARISON:  The same day. FINDINGS: Pigtail catheter is again noted on the right. Right-sided pneumothorax is again seen slightly increased when compared with the prior exam. Airspace opacities are again seen bilaterally stable from the prior exam. No new focal abnormality is noted. IMPRESSION: Slight increase in right-sided pneumothorax. Pigtail catheter is noted in place. Electronically Signed   By: Oneil Devonshire M.D.   On: 08/30/2024 23:40   DG CHEST PORT 1 VIEW Result Date: 08/30/2024 CLINICAL DATA:  Pneumothorax. EXAM: PORTABLE CHEST 1 VIEW COMPARISON:  08/30/2024 FINDINGS: Shallow inspiration. Cardiac enlargement. Bilateral pulmonary infiltrates are increasing since prior study. Small right pneumothorax with right chest tube in place. Pneumothorax is increased in size since prior study, now measuring about 10 mm depth compared to 5 mm previously. 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 Calcification of the aorta. Degenerative changes in the spine. IMPRESSION: 1. Cardiac enlargement. Increasing pulmonary infiltrates bilaterally. 2. Small but increasing size of right pneumothorax with right chest tube in place. Electronically Signed   By: Elsie Gravely M.D.   On: 08/30/2024 21:30   DG CHEST PORT 1 VIEW Result Date: 08/30/2024 CLINICAL DATA:  Chest tube placement. EXAM: PORTABLE CHEST 1 VIEW COMPARISON:  Same day. FINDINGS: Right-sided chest tube is unchanged. Small right pneumothorax is again noted. IMPRESSION: Stable small right pneumothorax.  Electronically Signed   By: Lynwood Landy Raddle M.D.   On: 08/30/2024 12:27   DG Chest Port 1 View Result Date: 08/30/2024 EXAM: 1 VIEW(S) XRAY OF THE CHEST 08/30/2024 07:07:00 AM COMPARISON: 08/29/2024 CLINICAL HISTORY: Chest tube in place [id number]. Right chest tube; rover. FINDINGS: LINES, TUBES AND DEVICES: Right pigtail catheter in place in mid right hemithorax. LUNGS AND PLEURA: Stable trace right apical pneumothorax. Mildly decreased lung volumes. Stable moderate lower lung interstitial thickening. No focal pulmonary opacity. No pulmonary edema. No pleural effusion. HEART AND MEDIASTINUM: No acute abnormality of the cardiac and mediastinal silhouettes. BONES AND SOFT TISSUES: No acute osseous abnormality. IMPRESSION: 1. Stable trace right apical pneumothorax. 2. Right pigtail catheter in place in the mid right hemithorax. Electronically signed by: Vincent Calk MD 08/30/2024 07:26 AM EDT RP Workstation: [id number]   DG Chest Portable 1 View Result  Date: 08/29/2024 CLINICAL DATA:  Chest tube insertion. EXAM: PORTABLE CHEST 1 VIEW COMPARISON:  08/29/2024 at 1222 FINDINGS: Right chest tube is coiled in the lateral aspect of the right chest. Right pneumothorax has completely resolved. Again noted are prominent interstitial markings in both lungs compatible with chronic changes. Slightly decreased lung volumes. The trachea is midline. Heart size is normal limits. IMPRESSION: 1. Right chest tube in place.  Right pneumothorax has resolved. 2. Chronic lung changes. Electronically Signed   By: Juliene Balder M.D.   On: 08/29/2024 15:29   DG Chest Port 1 View Result Date: 08/29/2024 CLINICAL DATA:  Chest tube removal. EXAM: PORTABLE CHEST 1 VIEW COMPARISON:  08/29/2024 at 1222 hours. FINDINGS: Trachea is midline. Heart size stable. Right pneumothorax is increased in size, right pneumothorax has increased, now moderate in size, status post chest tube removal. Coarsened interstitial markings, better evaluated on CT chest  12/06/2023. IMPRESSION: 1. Moderate right pneumothorax, increased from earlier today, status post right chest tube removal. 2. Coarsened pulmonary markings, indicative of interstitial lung disease, characterized as usual interstitial pneumonitis on 12/06/2023 CT chest. Electronically Signed   By: Newell Eke M.D.   On: 08/29/2024 14:50   DG Chest Portable 1 View Result Date: 08/29/2024 CLINICAL DATA:  Chest tube insertion. EXAM: PORTABLE CHEST 1 VIEW COMPARISON:  08/29/2024 FINDINGS: Pigtail right chest tube has been placed. The tube is reconstituted along the lateral aspect of the right chest. The right pneumothorax has significantly decreased in size. Residual 10% pneumothorax. Patchy densities in the right lung and densities at the left lung base most likely related to atelectasis. Patient also has chronic lung changes. Trachea is midline. Heart size is grossly stable. IMPRESSION: 1. Placement of right chest tube with significant decrease in the right pneumothorax. Residual 10% right pneumothorax. 2. Patchy densities in the right lung and left lung base. Findings are most compatible with atelectasis. Electronically Signed   By: Juliene Balder M.D.   On: 08/29/2024 13:04           LOS: 2 days   Time spent= 35 mins    Burgess JAYSON Dare, MD [facility]  If 7PM-7AM, please contact night-coverage  08/31/2024, 12:26 PM

## 2024-09-01 ENCOUNTER — Inpatient Hospital Stay (HOSPITAL_COMMUNITY)

## 2024-09-01 DIAGNOSIS — J939 Pneumothorax, unspecified: Secondary | ICD-10-CM | POA: Diagnosis not present

## 2024-09-01 LAB — CBC
HCT: 43 % (ref 39.0–52.0)
Hemoglobin: 14.3 g/dL (ref 13.0–17.0)
MCH: 26.8 pg (ref 26.0–34.0)
MCHC: 33.3 g/dL (ref 30.0–36.0)
MCV: 80.5 fL (ref 80.0–100.0)
Platelets: 189 K/uL (ref 150–400)
RBC: 5.34 MIL/uL (ref 4.22–5.81)
RDW: 13.1 % (ref 11.5–15.5)
WBC: 14.5 K/uL — ABNORMAL HIGH (ref 4.0–10.5)
nRBC: 0 % (ref 0.0–0.2)

## 2024-09-01 LAB — BASIC METABOLIC PANEL WITH GFR
Anion gap: 10 (ref 5–15)
BUN: 10 mg/dL (ref 8–23)
CO2: 25 mmol/L (ref 22–32)
Calcium: 8.5 mg/dL — ABNORMAL LOW (ref 8.9–10.3)
Chloride: 96 mmol/L — ABNORMAL LOW (ref 98–111)
Creatinine, Ser: 0.4 mg/dL — ABNORMAL LOW (ref 0.61–1.24)
GFR, Estimated: >60 mL/min (ref >60)
Glucose, Bld: 136 mg/dL — ABNORMAL HIGH (ref 70–99)
Potassium: 4 mmol/L (ref 3.5–5.1)
Sodium: 131 mmol/L — ABNORMAL LOW (ref 135–145)

## 2024-09-01 LAB — MAGNESIUM: Magnesium: 1.8 mg/dL (ref 1.7–2.4)

## 2024-09-01 MED ORDER — LEVALBUTEROL HCL 0.63 MG/3ML IN NEBU
0.6300 mg | INHALATION_SOLUTION | Freq: Two times a day (BID) | RESPIRATORY_TRACT | Status: DC
  Administered 2024-09-01: 0.63 mg via RESPIRATORY_TRACT
  Filled 2024-09-01 (×2): qty 3

## 2024-09-01 NOTE — Progress Notes (Signed)
 Mobility Specialist Progress Note;   09/01/24 0858  Mobility  Activity Ambulated with assistance  Level of Assistance Standby assist, set-up cues, supervision of patient - no hands on  Assistive Device Front wheel walker  Distance Ambulated (ft) 400 ft  Activity Response Tolerated well  Mobility Referral Yes  Mobility visit 1 Mobility  Mobility Specialist Start Time (ACUTE ONLY) [id number]  Mobility Specialist Stop Time (ACUTE ONLY) 0908  Mobility Specialist Time Calculation (min) (ACUTE ONLY) 10 min   Pt agreeable to mobility. Required no physical assistance during ambulation, SV for safety. Ambulated on 3LO2, SPO2 89-92% throughout. Recovers quickly w/ breaks. Pt returned back to bed and left with all needs met, call bell in reach. Returned to suction.   Lauraine Erm Mobility Specialist Please contact via SecureChat or [employer] [phone]

## 2024-09-01 NOTE — Progress Notes (Signed)
 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  Initial chest x-ray was completed showing large right pneumothorax and chronic interstitial lung disease changes.  Right chest tube was placed by ED staff.  TRH consulted for admission and PCCM consulted for assistance in chest tube management.   Upon initial assessment of patient within ED, patient hemodynamically stable with some hypertension notable.  Patient on 4 L nasal cannula with O2 sats 94 to 95%, respiratory rate low 20s. Patient reports that had similar pain/discomfort over a week ago but went away. Patient noticed chest pain and eventually SOB development after heavy lifting yesterday afternoon. SOB progressively got worse with exertion. Patient denies dizziness, vertigo, syncope, hemoptysis, fevers, chills, sick contacts. Patient endorses slight increase in cough after incident began to occur.  Upon assessing the [place] drain system of the chest tube-noticeable airleak was evaluated from and upon assessing chest tube site, chest tube was completely out chest.  Immediately called for stat chest x-ray to come to be completed, and placement of split gauze dressing.  Repeat chest x-ray showing significant pneumothorax on right side.  Discussed with patient and wife about emergent placement of second chest tube.  Chest tube placed more anteriorly. Right lung re-expansion noted.   Overnight he did not tolerate waterseal and had  pneumothorax reexpansion. He has a persistent airleak    Pertinent  Medical History   Past Medical History:  Diagnosis Date   Acid reflux    Angina pectoris    Anxiety    Arthritis    Coronary artery disease    Enlarged prostate    per [facility] new patient packet   Erectile dysfunction    High blood sugar    per [facility] new patient packet   Hyperlipidemia    Hypertension    IBS (irritable bowel syndrome)    ILD (interstitial lung disease) (HCC)    OSA (obstructive sleep apnea)    Pulmonary fibrosis (HCC)    per [facility] new patient packet     Significant Hospital Events: Including procedures, antibiotic start and stop dates in addition to other pertinent events   10/8 Pulm Consult for Right Pneumo, Hx of ILD  10/9 Persistent airleak, small apical pneumo on CXR   Interim History / Subjective:  Patient stable, on 4 LNC. Chest tube placed in ED- out upon assessment- sutures not secure  -Post Chest X-ray showing pneumothorax - Overnight back on suction     Objective    Blood pressure 138/65, pulse 88, temperature 97.8 F (36.6 C), temperature source Oral, resp. rate 16, weight 91.3 kg, SpO2 96%.    FiO2 (%):  [28 %] 28 %   Intake/Output Summary (Last 24 hours) at 09/01/2024 1512 Last data filed at 09/01/2024 1300 Gross per 24 hour  Intake 840 ml  Output 3150 ml  Net -2310 ml   Filed Weights   09/01/24 0627  Weight: 91.3 kg    Examination: General: Elderly male, obese, pleasant, no distress HEENT: Normocephalic, atraumatic CV: RRR, normal  S1, normal S2 pulm: Diminished breath sounds on the right, chest tube anteriorly with clean dressing Abs: Soft, nontender, nondistended, positive bowel sounds Extremities: No edema, moves all extremities Skin: No skin rash Neuro: Alert and oriented x 4   Resolved problem list   Assessment and Plan  Spontaneous pneumothorax secondary to ILD -MD Ramaswamy outpatient for ILD/pulmonary fibrosis -Per patient, has been on 2 L of  nasal cannula chronically for approximately 2 years  Patient with ongoing air leak, chest x-ray today in the morning with worse pneumothorax despite chest tube on suction.  Chest tube was flushed, post flushing chest x-ray with some improvement in pneumothorax however chest tube directed downwards and pneumothorax in the apex.  Will obtain a CT chest due to ongoing airleak.  Will also thoracic surgery for persistent bronchopleural fistula on Monday if he has an ongoing air leak.   History of COPD/OSA on CPAP Chronically on 2 L nasal cannula    Other problems managed by primary: Hypertension Hyperlipidemia Anxiety Diabetes type 2 CAD Acid reflux   Labs   CBC: Recent Labs  Lab 08/29/24 1000 08/30/24 0555 08/31/24 0258 09/01/24 0338  WBC 12.7* 14.8* 14.8* 14.5*  HGB 15.4 14.2 14.6 14.3  HCT 47.5 42.5 44.0 43.0  MCV 82.2 81.6 80.6 80.5  PLT 205 169 187 189    Basic Metabolic Panel: Recent Labs  Lab 08/29/24 1000 08/30/24 0555 08/31/24 0258 09/01/24 0338  NA 130* 126* 126* 131*  K 4.3 4.2 3.8 4.0  CL 93* 92* 91* 96*  CO2 25 25 25 25   GLUCOSE 235* 149* 141* 136*  BUN 8 10 10 10   CREATININE 0.53* 0.58* 0.55* 0.40*  CALCIUM  8.8* 8.5* 8.5* 8.5*  MG  --   --  1.7 1.8   GFR: Estimated Creatinine Clearance: 83.3 mL/min (A) (by C-G formula based on SCr of 0.4 mg/dL (L)). Recent Labs  Lab 08/29/24 1000 08/30/24 0555 08/31/24 0258 09/01/24 0338  WBC 12.7* 14.8* 14.8* 14.5*    Liver Function Tests: Recent Labs  Lab 08/30/24 0555  AST 23  ALT 19  ALKPHOS 47  BILITOT 0.9  PROT 6.1*  ALBUMIN 3.2*   No results for input(s): LIPASE, AMYLASE in the last 168 hours. No results for input(s): AMMONIA in the last 168 hours.  ABG    Component Value Date/Time   HCO3 28.0 09/09/2023 1319   TCO2 29 09/09/2023 1319   O2SAT 76 09/09/2023 1319     Coagulation Profile: No results for input(s): INR, PROTIME in the last 168 hours.  Cardiac Enzymes: No  results for input(s): CKTOTAL, CKMB, CKMBINDEX, TROPONINI in the last 168 hours.  HbA1C: Hgb A1c MFr Bld  Date/Time Value Ref Range Status  06/11/2024 02:59 PM 6.6 (H) <5.7 % Final    Comment:    For someone without known diabetes, a hemoglobin A1c value of 6.5% or greater indicates that they may have  diabetes and this should be confirmed with a follow-up  test. . For someone with known diabetes, a value <7% indicates  that their diabetes is well controlled and a value  greater than or equal to 7% indicates suboptimal  control. A1c targets should be individualized based on  duration of diabetes, age, comorbid conditions, and  other considerations. . Currently, no consensus exists regarding use of hemoglobin A1c for diagnosis of diabetes for children. SABRA   03/02/2024 03:19 PM 7.1 (H) <5.7 % of total Hgb Final    Comment:    For someone without  known diabetes, a hemoglobin A1c value of 6.5% or greater indicates that they may have  diabetes and this should be confirmed with a follow-up  test. . For someone with known diabetes, a value <7% indicates  that their diabetes is well controlled and a value  greater than or equal to 7% indicates suboptimal  control. A1c targets should be individualized based on  duration of diabetes, age, comorbid conditions, and  other considerations. . Currently, no consensus exists regarding use of hemoglobin A1c for diagnosis of diabetes for children. .     CBG: No results for input(s): GLUCAP in the last 168 hours.  Review of Systems:      Past Medical History:  He,  has a past medical history of Acid reflux, Angina pectoris, Anxiety, Arthritis, Coronary artery disease, Enlarged prostate, Erectile dysfunction, High blood sugar, Hyperlipidemia, Hypertension, IBS (irritable bowel syndrome), ILD (interstitial lung disease) (HCC), OSA (obstructive sleep apnea), and Pulmonary fibrosis (HCC).   Surgical History:   Past Surgical  History:  Procedure Laterality Date   APPENDECTOMY     COLON SURGERY     perforated bowel and hernia repair   COLONOSCOPY  2024   per [facility] new patient packet   COLONOSCOPY N/A 05/17/2024   Procedure: COLONOSCOPY;  Surgeon: Legrand Victory LITTIE DOUGLAS, MD;  Location: [facility];  Service: Gastroenterology;  Laterality: N/A;   HEMOSTASIS CLIP PLACEMENT  05/17/2024   Procedure: CONTROL OF HEMORRHAGE, GI TRACT, ENDOSCOPIC, BY CLIPPING OR OVERSEWING;  Surgeon: Legrand Victory LITTIE DOUGLAS, MD;  Location: [facility];  Service: Gastroenterology;;   ILIAC ARTERY ANEURYSM REPAIR Left 2006   Dr. Oris (redo surgery)   POLYPECTOMY  05/17/2024   Procedure: POLYPECTOMY, INTESTINE;  Surgeon: Legrand Victory LITTIE DOUGLAS, MD;  Location: [facility];  Service: Gastroenterology;;   REPLACEMENT TOTAL KNEE Left 2008   Dr.Yeats, per [facility] new patient packet   REPLACEMENT TOTAL KNEE Right 2018   Dr.J, per [facility] new patient packet   REPLACEMENT TOTAL KNEE BILATERAL     RIGHT HEART CATH N/A 09/09/2023   Procedure: RIGHT HEART CATH;  Surgeon: Rolan Ezra RAMAN, MD;  Location: [facility] INVASIVE CV LAB;  Service: Cardiovascular;  Laterality: N/A;   TONSILLECTOMY     age 74     Social History:   reports that he quit smoking about 36 years ago. His smoking use included cigarettes. He started smoking about 59 years ago. He has a 46 pack-year smoking history. He has been exposed to tobacco smoke. He has never used smokeless tobacco. He reports that he does not currently use alcohol after a past usage of about 4.0 - 5.0 standard drinks of alcohol per week. He reports that he does not use drugs.   Family History:  His family history includes Alzheimer's disease in his father and sister; Heart attack in his father; Heart murmur in his daughter; Hypertension in his daughter; Obesity in his daughter; Other in his sister; Parkinson's disease in his mother; Stroke in his father. There is no history of Colon cancer, Esophageal cancer, Rectal cancer, or Stomach  cancer.   Allergies No Known Allergies   Home Medications  Prior to Admission medications   Medication Sig Start Date End Date Taking? Authorizing Provider  amLODipine  (NORVASC ) 5 MG tablet TAKE 1 TABLET (5 MG TOTAL) BY MOUTH DAILY. 05/28/24 05/23/25  [name], NP  aspirin EC 81 MG tablet Take 81 mg by mouth daily.    [provider]  atenolol  (TENORMIN ) 50 MG tablet Take  1.5 tablets (75 mg total) by mouth daily. 06/07/24   Fargo, Amy E, NP  Coenzyme Q10 (COQ10 PO) Take 500 mg by mouth daily.    [provider]  FIBER PO Take 4 capsules by mouth 2 (two) times daily.    [provider]  Flaxseed, Linseed, (FLAXSEED OIL PO) Take 1 capsule by mouth daily.    [provider]  fluticasone  (FLONASE ) 50 MCG/ACT nasal spray Place 2 sprays into both nostrils daily. 12/27/22   [provider]  hydrochlorothiazide  (HYDRODIURIL ) 25 MG tablet TAKE 1 TABLET BY MOUTH EVERY DAY 04/04/24   Eubanks, Jessica K, NP  lisinopril  (ZESTRIL ) 40 MG tablet Take 1 tablet (40 mg total) by mouth daily. 06/07/24   Fargo, Amy E, NP  loratadine (CLARITIN) 10 MG tablet Take 10 mg by mouth daily.    [provider]  MAGNESIUM-POTASSIUM PO Take 1 tablet by mouth daily. With zinc Patient not taking: Reported on 06/11/2024    [provider]  naproxen (NAPROSYN) 500 MG tablet Take 1 tablet (500 mg total) by mouth 2 (two) times daily. 08/22/24   Bauer, Collin S, PA-C  omeprazole  (PRILOSEC) 20 MG capsule TAKE 1 CAPSULE BY MOUTH EVERY DAY 08/22/24   Eubanks, Jessica K, NP  Pirfenidone  801 MG TABS Take 1 tablet (801 mg total) by mouth 3 (three) times daily with meals. 06/08/24   Geronimo Amel, MD  rosuvastatin  (CRESTOR ) 10 MG tablet TAKE 1 TABLET BY MOUTH EVERY DAY 04/27/24   Croitoru, Mihai, MD  tadalafil  (CIALIS ) 20 MG tablet Take 1 tablet (20 mg total) by mouth daily as needed. 08/22/23   Stoneking, Adine PARAS., MD  TURMERIC-GINGER PO Take 2,600 mg by mouth daily.     [provider]  UNABLE TO FIND Med Name: Taking experimental drug or possible placeb for lungs, managed by [employer] with [place]    [provider]      Zola Herter, MD [facility] Office: [phone]   See Amion for personal pager PCCM on call pager [phone] until 7pm. Please call Elink 7p-7a. [phone]

## 2024-09-01 NOTE — Plan of Care (Signed)
   Problem: Health Behavior/Discharge Planning: Goal: Ability to manage health-related needs will improve Outcome: Progressing   Problem: Clinical Measurements: Goal: Ability to maintain clinical measurements within normal limits will improve Outcome: Progressing   Problem: Clinical Measurements: Goal: Will remain free from infection Outcome: Progressing

## 2024-09-01 NOTE — Progress Notes (Addendum)
 PROGRESS NOTE    Vincent Meyers  FMW:[id number] DOB: 06-Mar-1946 DOA: 08/29/2024 PCP: [name], NP    Brief Narrative:  78 year old with history of ILD, COPD, OSA, HTN, HLD, DM2 comes to the ED with shortness of breath.  Chest x-ray showed large right-sided spontaneous pneumothorax requiring chest tube placement.  Pulmonary team has been consulted.   Assessment & Plan:  Acute respiratory distress secondary to right-sided large pneumothorax - Chest tube placed by EDP, pulmonary team has been consulted.  Initial improvement after chest tube placement but later had reexpansion of pneumothorax therefore reinitiated wall suction.  Pulmonary team managing.  Repeat chest x-ray today morning appears to be stable  Addendum 1025am Worsening CXR, will contact Pulm team.   History of ILD COPD - Follows with Dr. Bubba on home pirfenidone , bronchodilators  Essential hypertension - On amlodipine ,  HCTZ, lisinopril .  IV as needed  Hyperlipidemia - Statin  CAD/PAD - Aspirin and statin.  Currently chest pain-free  GERD - PPI  DM2 - Sliding scale and Accu-Cheks..-  Hyponatremia - From hypovolemia.  IV fluids    DVT prophylaxis: heparin  injection 5,000 Units Start: 08/30/24 0600 Place and maintain sequential compression device Start: 08/29/24 1443      Code Status: Full Code Family Communication:   Status is: Inpatient Remains inpatient appropriate because: Ongoing management for pneumothorax Continue hospital stay until cleared by pulmonary team.  PT Follow up Recs:   Subjective: Feeling okay still has some exertional dyspnea but better.  Examination:  General exam: Appears calm and comfortable, elderly frail Respiratory system: Clear to auscultation. Respiratory effort normal. Cardiovascular system: S1 & S2 heard, RRR. No JVD, murmurs, rubs, gallops or clicks. No pedal edema. Gastrointestinal system: Abdomen is nondistended, soft and nontender. No organomegaly  or masses felt. Normal bowel sounds heard. Central nervous system: Alert and oriented. No focal neurological deficits. Extremities: Symmetric 5 x 5 power. Skin: No rashes, lesions or ulcers Psychiatry: Judgement and insight appear normal. Mood & affect appropriate. Chest tube in place               Diet Orders (From admission, onward)     Start     Ordered   08/29/24 1349  Diet heart healthy/carb modified Room service appropriate? Yes; Fluid consistency: Thin  Diet effective now       Question Answer Comment  Diet-HS Snack? Nothing   Room service appropriate? Yes   Fluid consistency: Thin      08/29/24 1350            Objective: Vitals:   09/01/24 0627 09/01/24 0740 09/01/24 0800 09/01/24 0938  BP:  (!) 153/68    Pulse:  80 75 78  Resp:  20    Temp:  97.9 F (36.6 C)    TempSrc:  Oral    SpO2:  96% 92%   Weight: 91.3 kg       Intake/Output Summary (Last 24 hours) at 09/01/2024 1028 Last data filed at 09/01/2024 0953 Gross per 24 hour  Intake 600 ml  Output 3175 ml  Net -2575 ml   Filed Weights   09/01/24 0627  Weight: 91.3 kg    Scheduled Meds:  amLODipine   5 mg Oral Daily   aspirin EC  81 mg Oral Daily   atenolol   75 mg Oral Daily   fluticasone   2 spray Each Nare Daily   heparin   5,000 Units Subcutaneous Q8H   hydrochlorothiazide   25 mg Oral Daily   levalbuterol  0.63 mg Nebulization Q6H   lisinopril   40 mg Oral Daily   loratadine  10 mg Oral Daily   pantoprazole  40 mg Oral Daily   Pirfenidone   801 mg Oral TID WC   psyllium  1 packet Oral Daily   rosuvastatin   10 mg Oral Daily   sodium chloride  flush  10 mL Intrapleural Q8H   Continuous Infusions:  Nutritional status     Body mass index is 31.51 kg/m.  Data Reviewed:   CBC: Recent Labs  Lab 08/29/24 1000 08/30/24 0555 08/31/24 0258 09/01/24 0338  WBC 12.7* 14.8* 14.8* 14.5*  HGB 15.4 14.2 14.6 14.3  HCT 47.5 42.5 44.0 43.0  MCV 82.2 81.6 80.6 80.5  PLT 205 169 187 189    Basic Metabolic Panel: Recent Labs  Lab 08/29/24 1000 08/30/24 0555 08/31/24 0258 09/01/24 0338  NA 130* 126* 126* 131*  K 4.3 4.2 3.8 4.0  CL 93* 92* 91* 96*  CO2 25 25 25 25   GLUCOSE 235* 149* 141* 136*  BUN 8 10 10 10   CREATININE 0.53* 0.58* 0.55* 0.40*  CALCIUM  8.8* 8.5* 8.5* 8.5*  MG  --   --  1.7 1.8   GFR: Estimated Creatinine Clearance: 83.3 mL/min (A) (by C-G formula based on SCr of 0.4 mg/dL (L)). Liver Function Tests: Recent Labs  Lab 08/30/24 0555  AST 23  ALT 19  ALKPHOS 47  BILITOT 0.9  PROT 6.1*  ALBUMIN 3.2*   No results for input(s): LIPASE, AMYLASE in the last 168 hours. No results for input(s): AMMONIA in the last 168 hours. Coagulation Profile: No results for input(s): INR, PROTIME in the last 168 hours. Cardiac Enzymes: No results for input(s): CKTOTAL, CKMB, CKMBINDEX, TROPONINI in the last 168 hours. BNP (last 3 results) No results for input(s): PROBNP in the last 8760 hours. HbA1C: No results for input(s): HGBA1C in the last 72 hours. CBG: No results for input(s): GLUCAP in the last 168 hours. Lipid Profile: No results for input(s): CHOL, HDL, LDLCALC, TRIG, CHOLHDL, LDLDIRECT in the last 72 hours. Thyroid Function Tests: No results for input(s): TSH, T4TOTAL, FREET4, T3FREE, THYROIDAB in the last 72 hours. Anemia Panel: No results for input(s): VITAMINB12, FOLATE, FERRITIN, TIBC, IRON, RETICCTPCT in the last 72 hours. Sepsis Labs: No results for input(s): PROCALCITON, LATICACIDVEN in the last 168 hours.  Recent Results (from the past 240 hours)  Resp panel by RT-PCR (RSV, Flu A&B, Covid) Anterior Nasal Swab     Status: None   Collection Time: 08/29/24 12:48 PM   Specimen: Anterior Nasal Swab  Result Value Ref Range Status   SARS Coronavirus 2 by RT PCR NEGATIVE NEGATIVE Final   Influenza A by PCR NEGATIVE NEGATIVE Final   Influenza B by PCR NEGATIVE NEGATIVE Final     Comment: (NOTE) The Xpert Xpress SARS-CoV-2/FLU/RSV plus assay is intended as an aid in the diagnosis of influenza from Nasopharyngeal swab specimens and should not be used as a sole basis for treatment. Nasal washings and aspirates are unacceptable for Xpert Xpress SARS-CoV-2/FLU/RSV testing.  Fact Sheet for Patients: BloggerCourse.com  Fact Sheet for Healthcare Providers: SeriousBroker.it  This test is not yet approved or cleared by the [place]  FDA and has been authorized for detection and/or diagnosis of SARS-CoV-2 by FDA under an Emergency Use Authorization (EUA). This EUA will remain in effect (meaning this test can be used) for the duration of the COVID-19 declaration under Section 564(b)(1) of the Act, 21 U.S.C. section 360bbb-3(b)(1), unless the authorization  is terminated or revoked.     Resp Syncytial Virus by PCR NEGATIVE NEGATIVE Final    Comment: (NOTE) Fact Sheet for Patients: BloggerCourse.com  Fact Sheet for Healthcare Providers: SeriousBroker.it  This test is not yet approved or cleared by the [place]  FDA and has been authorized for detection and/or diagnosis of SARS-CoV-2 by FDA under an Emergency Use Authorization (EUA). This EUA will remain in effect (meaning this test can be used) for the duration of the COVID-19 declaration under Section 564(b)(1) of the Act, 21 U.S.C. section 360bbb-3(b)(1), unless the authorization is terminated or revoked.  Performed at [facility], [address], [place], [place] [address]          Radiology Studies: DG CHEST PORT 1 VIEW Result Date: 09/01/2024 CLINICAL DATA:  Pneumothorax. EXAM: PORTABLE CHEST 1 VIEW COMPARISON:  August 31, 2024 FINDINGS: There is stable right-sided chest tube positioning. A small to moderate size right-sided pneumothorax is seen. This measures 2.2 cm at the apex and is  increased in size when compared to the prior. Heart size and mediastinal contours are within normal limits. Stable bilateral airspace opacities are noted. No pleural effusion is seen. The visualized skeletal structures are unremarkable. IMPRESSION: 1. Small to moderate size right-sided pneumothorax, increased in size when compared to the prior study. 2. Stable bilateral airspace opacities. Electronically Signed   By: Suzen Dials M.D.   On: 09/01/2024 10:21   DG CHEST PORT 1 VIEW Result Date: 08/31/2024 CLINICAL DATA:  Pneumothorax. EXAM: PORTABLE CHEST 1 VIEW COMPARISON:  August 30, 2024. FINDINGS: Stable cardiomediastinal silhouette. Stable bilateral lung opacities are noted. Right-sided chest tube is again noted. Right-sided pneumothorax is significantly smaller, although small apical component remains. Bony thorax is unremarkable. IMPRESSION: Right-sided pneumothorax is significantly smaller, although small apical component remains. Electronically Signed   By: Lynwood Landy Raddle M.D.   On: 08/31/2024 08:33   DG CHEST PORT 1 VIEW Result Date: 08/30/2024 CLINICAL DATA:  Follow-up pneumothorax increased shortness of breath EXAM: PORTABLE CHEST 1 VIEW COMPARISON:  The same day. FINDINGS: Pigtail catheter is again noted on the right. Right-sided pneumothorax is again seen slightly increased when compared with the prior exam. Airspace opacities are again seen bilaterally stable from the prior exam. No new focal abnormality is noted. IMPRESSION: Slight increase in right-sided pneumothorax. Pigtail catheter is noted in place. Electronically Signed   By: Oneil Devonshire M.D.   On: 08/30/2024 23:40   DG CHEST PORT 1 VIEW Result Date: 08/30/2024 CLINICAL DATA:  Pneumothorax. EXAM: PORTABLE CHEST 1 VIEW COMPARISON:  08/30/2024 FINDINGS: Shallow inspiration. Cardiac enlargement. Bilateral pulmonary infiltrates are increasing since prior study. Small right pneumothorax with right chest tube in place. Pneumothorax is  increased in size since prior study, now measuring about 10 mm depth compared to 5 mm previously. 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 Calcification of the aorta. Degenerative changes in the spine. IMPRESSION: 1. Cardiac enlargement. Increasing pulmonary infiltrates bilaterally. 2. Small but increasing size of right pneumothorax with right chest tube in place. Electronically Signed   By: Elsie Gravely M.D.   On: 08/30/2024 21:30   DG CHEST PORT 1 VIEW Result Date: 08/30/2024 CLINICAL DATA:  Chest tube placement. EXAM: PORTABLE CHEST 1 VIEW COMPARISON:  Same day. FINDINGS: Right-sided chest tube is unchanged. Small right pneumothorax is again noted. IMPRESSION: Stable small right pneumothorax. Electronically Signed   By: Lynwood Landy Raddle M.D.   On: 08/30/2024 12:27  LOS: 3 days   Time spent= 35 mins    Burgess JAYSON Dare, MD [facility]  If 7PM-7AM, please contact night-coverage  09/01/2024, 10:28 AM

## 2024-09-02 ENCOUNTER — Inpatient Hospital Stay (HOSPITAL_COMMUNITY)

## 2024-09-02 DIAGNOSIS — J439 Emphysema, unspecified: Secondary | ICD-10-CM | POA: Diagnosis not present

## 2024-09-02 DIAGNOSIS — J939 Pneumothorax, unspecified: Secondary | ICD-10-CM | POA: Diagnosis not present

## 2024-09-02 LAB — BASIC METABOLIC PANEL WITH GFR
Anion gap: 9 (ref 5–15)
BUN: 9 mg/dL (ref 8–23)
CO2: 27 mmol/L (ref 22–32)
Calcium: 8.8 mg/dL — ABNORMAL LOW (ref 8.9–10.3)
Chloride: 93 mmol/L — ABNORMAL LOW (ref 98–111)
Creatinine, Ser: 0.63 mg/dL (ref 0.61–1.24)
GFR, Estimated: >60 mL/min (ref >60)
Glucose, Bld: 139 mg/dL — ABNORMAL HIGH (ref 70–99)
Potassium: 4.5 mmol/L (ref 3.5–5.1)
Sodium: 129 mmol/L — ABNORMAL LOW (ref 135–145)

## 2024-09-02 LAB — CBC
HCT: 43 % (ref 39.0–52.0)
Hemoglobin: 14.2 g/dL (ref 13.0–17.0)
MCH: 26.8 pg (ref 26.0–34.0)
MCHC: 33 g/dL (ref 30.0–36.0)
MCV: 81.3 fL (ref 80.0–100.0)
Platelets: 197 K/uL (ref 150–400)
RBC: 5.29 MIL/uL (ref 4.22–5.81)
RDW: 13.2 % (ref 11.5–15.5)
WBC: 14.4 K/uL — ABNORMAL HIGH (ref 4.0–10.5)
nRBC: 0 % (ref 0.0–0.2)

## 2024-09-02 LAB — MAGNESIUM: Magnesium: 1.9 mg/dL (ref 1.7–2.4)

## 2024-09-02 NOTE — Progress Notes (Signed)
 Upon doing bedside handoff, patient reports discomfort and pain at chest tube site. States he feels uncomfortable and is having trouble breathing. O2 increased to 3L. Oncoming RN states she will page the provider. No further needs at this time.  Anabel Lykins E Marcelino Campos, RN

## 2024-09-02 NOTE — Plan of Care (Signed)
   Problem: Clinical Measurements: Goal: Ability to maintain clinical measurements within normal limits will improve Outcome: Progressing

## 2024-09-02 NOTE — Progress Notes (Signed)
 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 pulmonary outpatient), OSA, COPD, hypertension, HLD, CAD, diabetes, anxiety, and acid reflux who presented to the emergency department at [facility] with worsening shortness of breath.  Initial chest x-ray was completed showing large right pneumothorax and chronic interstitial lung disease changes.  Right chest tube was placed by ED staff.  TRH consulted for admission and PCCM consulted for assistance in chest tube management.   Upon initial assessment of patient within ED, patient hemodynamically stable with some hypertension notable.  Patient on 4 L nasal cannula with O2 sats 94 to 95%, respiratory rate low 20s. Patient reports that had similar pain/discomfort over a week ago but went away. Patient noticed chest pain and eventually SOB development after heavy lifting yesterday afternoon. SOB progressively got worse with exertion. Patient denies dizziness, vertigo, syncope, hemoptysis, fevers, chills, sick contacts. Patient endorses slight increase in cough after incident began to occur.  Upon assessing the [place] drain system of the chest tube-noticeable airleak was evaluated from and upon assessing chest tube site, chest tube was completely out chest.  Immediately called for stat chest x-ray to come to be completed, and placement of split gauze dressing.  Repeat chest x-ray showing significant pneumothorax on right side.  Discussed with patient and wife about emergent placement of second chest tube.  Chest tube placed more anteriorly. Right lung re-expansion noted.   Overnight he did not tolerate waterseal and had  pneumothorax reexpansion. He has a persistent airleak    Pertinent  Medical History   Past Medical History:  Diagnosis Date   Acid reflux    Angina pectoris    Anxiety    Arthritis    Coronary artery disease    Enlarged prostate    per [facility] new patient packet   Erectile dysfunction    High blood sugar    per [facility] new patient packet   Hyperlipidemia    Hypertension    IBS (irritable bowel syndrome)    ILD (interstitial lung disease) (HCC)    OSA (obstructive sleep apnea)    Pulmonary fibrosis (HCC)    per [facility] new patient packet     Significant Hospital Events: Including procedures, antibiotic start and stop dates in addition to other pertinent events   10/8 Pulm Consult for Right Pneumo, Hx of ILD  10/9 Persistent airleak, small apical pneumo on CXR   Interim History / Subjective:  Patient stable, on 4 LNC. Chest tube placed in ED- out upon assessment- sutures not secure  -Post Chest X-ray showing pneumothorax - Overnight back on suction     Objective    Blood pressure 128/65, pulse 64, temperature (!) 96.5 F (35.8 C), temperature source Axillary, resp. rate 18, weight 91.3 kg, SpO2 97%.    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  Chest x-ray with complete reexpansion but continues on suction.  If he has a persistent airleak tomorrow, will consider consulting thoracic surgery for evaluation for persistent air leak suggestive of bronchopleural fistula.  If not a surgical candidate with ongoing air leak can consider referral to a facility that can insert bronchoscopic valves for persistent air leak from a bronchopleural fistula.   History of COPD/OSA on CPAP Chronically on 2 L nasal cannula   Other problems managed by primary: Hypertension Hyperlipidemia Anxiety Diabetes type 2 CAD Acid reflux   Labs   CBC: Recent Labs  Lab 08/29/24 1000 08/30/24 0555 08/31/24 0258 09/01/24 0338 09/02/24 0228  WBC 12.7* 14.8* 14.8* 14.5* 14.4*  HGB 15.4 14.2 14.6 14.3 14.2  HCT 47.5 42.5 44.0 43.0 43.0  MCV 82.2 81.6 80.6 80.5 81.3  PLT 205 169 187 189 197    Basic Metabolic Panel: Recent Labs  Lab 08/29/24 1000 08/30/24 0555 08/31/24 0258 09/01/24 0338 09/02/24 0228  NA 130* 126* 126* 131* 129*  K 4.3 4.2 3.8 4.0 4.5  CL 93* 92* 91* 96* 93*  CO2 25 25 25 25 27   GLUCOSE 235* 149* 141* 136* 139*  BUN 8 10 10 10 9   CREATININE 0.53* 0.58* 0.55* 0.40* 0.63  CALCIUM  8.8* 8.5* 8.5* 8.5* 8.8*  MG  --   --  1.7 1.8 1.9   GFR: Estimated Creatinine Clearance: 83.3 mL/min (by C-G formula based on SCr of 0.63 mg/dL). Recent Labs  Lab 08/30/24 0555 08/31/24 0258 09/01/24 0338 09/02/24 0228  WBC 14.8* 14.8* 14.5* 14.4*    Liver Function Tests: Recent Labs  Lab 08/30/24 0555  AST 23  ALT 19  ALKPHOS 47  BILITOT 0.9  PROT 6.1*  ALBUMIN 3.2*   No results for input(s): LIPASE, AMYLASE in the last 168 hours. No results for input(s): AMMONIA in the last 168 hours.  ABG    Component Value Date/Time   HCO3 28.0 09/09/2023 1319   TCO2 29 09/09/2023 1319   O2SAT 76 09/09/2023 1319     Coagulation Profile: No results for input(s): INR, PROTIME in the last  168 hours.  Cardiac Enzymes: No results for input(s): CKTOTAL, CKMB, CKMBINDEX, TROPONINI in the last 168 hours.  HbA1C: Hgb A1c MFr Bld  Date/Time Value Ref Range Status  06/11/2024 02:59 PM 6.6 (H) <5.7 % Final    Comment:    For someone without known diabetes, a hemoglobin A1c value of 6.5% or greater indicates that they may have  diabetes and this should be confirmed with a follow-up  test. . For someone with known diabetes, a value <7% indicates  that their diabetes is well controlled and a value  greater than or equal to 7% indicates suboptimal  control. A1c targets should be individualized based on  duration of diabetes, age, comorbid conditions, and  other considerations. . Currently, no consensus exists regarding use of hemoglobin A1c for diagnosis of diabetes for children. SABRA   03/02/2024 03:19 PM 7.1 (H) <5.7 % of total Hgb Final    Comment:  For someone without known diabetes, a hemoglobin A1c value of 6.5% or greater indicates that they may have  diabetes and this should be confirmed with a follow-up  test. . For someone with known diabetes, a value <7% indicates  that their diabetes is well controlled and a value  greater than or equal to 7% indicates suboptimal  control. A1c targets should be individualized based on  duration of diabetes, age, comorbid conditions, and  other considerations. . Currently, no consensus exists regarding use of hemoglobin A1c for diagnosis of diabetes for children. .     CBG: No results for input(s): GLUCAP in the last 168 hours.  Review of Systems:      Past Medical History:  He,  has a past medical history of Acid reflux, Angina pectoris, Anxiety, Arthritis, Coronary artery disease, Enlarged prostate, Erectile dysfunction, High blood sugar, Hyperlipidemia, Hypertension, IBS (irritable bowel syndrome), ILD (interstitial lung disease) (HCC), OSA (obstructive sleep apnea), and Pulmonary fibrosis (HCC).    Surgical History:   Past Surgical History:  Procedure Laterality Date   APPENDECTOMY     COLON SURGERY     perforated bowel and hernia repair   COLONOSCOPY  2024   per [facility] new patient packet   COLONOSCOPY N/A 05/17/2024   Procedure: COLONOSCOPY;  Surgeon: Legrand Victory LITTIE DOUGLAS, MD;  Location: [facility];  Service: Gastroenterology;  Laterality: N/A;   HEMOSTASIS CLIP PLACEMENT  05/17/2024   Procedure: CONTROL OF HEMORRHAGE, GI TRACT, ENDOSCOPIC, BY CLIPPING OR OVERSEWING;  Surgeon: Legrand Victory LITTIE DOUGLAS, MD;  Location: [facility];  Service: Gastroenterology;;   ILIAC ARTERY ANEURYSM REPAIR Left 2006   Dr. Oris (redo surgery)   POLYPECTOMY  05/17/2024   Procedure: POLYPECTOMY, INTESTINE;  Surgeon: Legrand Victory LITTIE DOUGLAS, MD;  Location: [facility];  Service: Gastroenterology;;   REPLACEMENT TOTAL KNEE Left 2008   Dr.Yeats, per [facility] new patient packet   REPLACEMENT TOTAL KNEE Right 2018   Dr.J, per [facility] new patient packet   REPLACEMENT TOTAL KNEE BILATERAL     RIGHT HEART CATH N/A 09/09/2023   Procedure: RIGHT HEART CATH;  Surgeon: Rolan Ezra RAMAN, MD;  Location: [facility] INVASIVE CV LAB;  Service: Cardiovascular;  Laterality: N/A;   TONSILLECTOMY     age 88     Social History:   reports that he quit smoking about 36 years ago. His smoking use included cigarettes. He started smoking about 59 years ago. He has a 46 pack-year smoking history. He has been exposed to tobacco smoke. He has never used smokeless tobacco. He reports that he does not currently use alcohol after a past usage of about 4.0 - 5.0 standard drinks of alcohol per week. He reports that he does not use drugs.   Family History:  His family history includes Alzheimer's disease in his father and sister; Heart attack in his father; Heart murmur in his daughter; Hypertension in his daughter; Obesity in his daughter; Other in his sister; Parkinson's disease in his mother; Stroke in his father. There is no history of Colon cancer,  Esophageal cancer, Rectal cancer, or Stomach cancer.   Allergies No Known Allergies   Home Medications  Prior to Admission medications   Medication Sig Start Date End Date Taking? Authorizing Provider  amLODipine  (NORVASC ) 5 MG tablet TAKE 1 TABLET (5 MG TOTAL) BY MOUTH DAILY. 05/28/24 05/23/25  [name], NP  aspirin EC 81 MG tablet Take 81 mg by mouth daily.    [provider]  atenolol  (TENORMIN ) 50  MG tablet Take 1.5 tablets (75 mg total) by mouth daily. 06/07/24   Fargo, Amy E, NP  Coenzyme Q10 (COQ10 PO) Take 500 mg by mouth daily.    [provider]  FIBER PO Take 4 capsules by mouth 2 (two) times daily.    [provider]  Flaxseed, Linseed, (FLAXSEED OIL PO) Take 1 capsule by mouth daily.    [provider]  fluticasone  (FLONASE ) 50 MCG/ACT nasal spray Place 2 sprays into both nostrils daily. 12/27/22   [provider]  hydrochlorothiazide  (HYDRODIURIL ) 25 MG tablet TAKE 1 TABLET BY MOUTH EVERY DAY 04/04/24   Eubanks, Jessica K, NP  lisinopril  (ZESTRIL ) 40 MG tablet Take 1 tablet (40 mg total) by mouth daily. 06/07/24   Fargo, Amy E, NP  loratadine (CLARITIN) 10 MG tablet Take 10 mg by mouth daily.    [provider]  MAGNESIUM-POTASSIUM PO Take 1 tablet by mouth daily. With zinc Patient not taking: Reported on 06/11/2024    [provider]  naproxen (NAPROSYN) 500 MG tablet Take 1 tablet (500 mg total) by mouth 2 (two) times daily. 08/22/24   Bauer, Collin S, PA-C  omeprazole  (PRILOSEC) 20 MG capsule TAKE 1 CAPSULE BY MOUTH EVERY DAY 08/22/24   Eubanks, Jessica K, NP  Pirfenidone  801 MG TABS Take 1 tablet (801 mg total) by mouth 3 (three) times daily with meals. 06/08/24   Geronimo Amel, MD  rosuvastatin  (CRESTOR ) 10 MG tablet TAKE 1 TABLET BY MOUTH EVERY DAY 04/27/24   Croitoru, Mihai, MD  tadalafil  (CIALIS ) 20 MG tablet Take 1 tablet (20 mg total) by mouth daily as needed. 08/22/23   Stoneking, Adine PARAS., MD   TURMERIC-GINGER PO Take 2,600 mg by mouth daily.    [provider]  UNABLE TO FIND Med Name: Taking experimental drug or possible placeb for lungs, managed by [facility] with [facility]    [provider]

## 2024-09-02 NOTE — Progress Notes (Signed)
 Mobility Specialist Progress Note;   09/02/24 0919  Mobility  Activity Pivoted/transferred from chair to bed  Level of Assistance Contact guard assist, steadying assist  Assistive Device None  Distance Ambulated (ft) 8 ft  Activity Response Tolerated well  Mobility Referral Yes  Mobility visit 1 Mobility  Mobility Specialist Start Time (ACUTE ONLY) 0919  Mobility Specialist Stop Time (ACUTE ONLY) 0925  Mobility Specialist Time Calculation (min) (ACUTE ONLY) 6 min   Pt deferred ambulation this AM d/t increased SOB, on 3LO2 upon arrival. Session limited d/t transport arriving to take pt to CT. Assisted pt in transferring from chair to bed requiring MinG assist and VC for safety. Pt left in bed with all needs met. With transport team.   Lauraine Erm Mobility Specialist Please contact via SecureChat or Rehab Office [phone]

## 2024-09-02 NOTE — Progress Notes (Signed)
 PROGRESS NOTE    Vincent Meyers  FMW:[id number] DOB: 14-Dec-1945 DOA: 08/29/2024 PCP: [name], NP    Brief Narrative:  78 year old with history of ILD, COPD, OSA, HTN, HLD, DM2 comes to the ED with shortness of breath.  Chest x-ray showed large right-sided spontaneous pneumothorax requiring chest tube placement.  Pulmonary team has been consulted.  Recurrent pneumothorax despite chest tube to suction.  CT chest planned, likely will require thoracic consult.   Assessment & Plan:  Acute respiratory distress secondary to right-sided large pneumothorax - Chest x-ray showed large right-sided spontaneous pneumothorax requiring chest tube placement.  Pulmonary team has been consulted.  Recurrent pneumothorax despite chest tube to suction.  CT chest done, pending results. Likely will need thoracic consult.    History of ILD COPD - Follows with Dr. Bubba on home pirfenidone , bronchodilators  Essential hypertension - On amlodipine ,  HCTZ, lisinopril .  IV as needed  Hyperlipidemia - Statin  CAD/PAD - Aspirin and statin.  Currently chest pain-free  GERD - PPI  DM2 - Sliding scale and Accu-Cheks..-  Hyponatremia - From hypovolemia.  Stop HCTZ  DVT prophylaxis: heparin  injection 5,000 Units Start: 08/30/24 0600 Place and maintain sequential compression device Start: 08/29/24 1443      Code Status: Full Code Family Communication:   Status is: Inpatient Remains inpatient appropriate because: Ongoing management for pneumothorax Continue hospital stay until cleared by pulmonary team.  PT Follow up Recs:   Subjective: Doing ok. Occasional sob and sharp chest pain on the right side.   Examination:  General exam: Appears calm and comfortable, elderly frail Respiratory system: Clear to auscultation. Respiratory effort normal. Cardiovascular system: S1 & S2 heard, RRR. No JVD, murmurs, rubs, gallops or clicks. No pedal edema. Gastrointestinal system: Abdomen is  nondistended, soft and nontender. No organomegaly or masses felt. Normal bowel sounds heard. Central nervous system: Alert and oriented. No focal neurological deficits. Extremities: Symmetric 5 x 5 power. Skin: No rashes, lesions or ulcers Psychiatry: Judgement and insight appear normal. Mood & affect appropriate. Chest tube in place               Diet Orders (From admission, onward)     Start     Ordered   08/29/24 1349  Diet heart healthy/carb modified Room service appropriate? Yes; Fluid consistency: Thin  Diet effective now       Question Answer Comment  Diet-HS Snack? Nothing   Room service appropriate? Yes   Fluid consistency: Thin      08/29/24 1350            Objective: Vitals:   09/02/24 0000 09/02/24 0100 09/02/24 0415 09/02/24 0804  BP: (!) 139/56  (!) 109/50 (!) 146/66  Pulse: 63 (!) 57 66 74  Resp: 17  18 20   Temp: 97.9 F (36.6 C)  97.7 F (36.5 C) 98.3 F (36.8 C)  TempSrc: Oral  Oral Oral  SpO2: 100% 98% 96% 91%  Weight:        Intake/Output Summary (Last 24 hours) at 09/02/2024 1024 Last data filed at 09/02/2024 0720 Gross per 24 hour  Intake 250 ml  Output 2285 ml  Net -2035 ml   Filed Weights   09/01/24 0627  Weight: 91.3 kg    Scheduled Meds:  amLODipine   5 mg Oral Daily   aspirin EC  81 mg Oral Daily   atenolol   75 mg Oral Daily   fluticasone   2 spray Each Nare Daily   heparin   5,000 Units  Subcutaneous Q8H   lisinopril   40 mg Oral Daily   loratadine  10 mg Oral Daily   pantoprazole  40 mg Oral Daily   Pirfenidone   801 mg Oral TID WC   psyllium  1 packet Oral Daily   rosuvastatin   10 mg Oral Daily   sodium chloride  flush  10 mL Intrapleural Q8H   Continuous Infusions:  Nutritional status     Body mass index is 31.51 kg/m.  Data Reviewed:   CBC: Recent Labs  Lab 08/29/24 1000 08/30/24 0555 08/31/24 0258 09/01/24 0338 09/02/24 0228  WBC 12.7* 14.8* 14.8* 14.5* 14.4*  HGB 15.4 14.2 14.6 14.3 14.2  HCT  47.5 42.5 44.0 43.0 43.0  MCV 82.2 81.6 80.6 80.5 81.3  PLT 205 169 187 189 197   Basic Metabolic Panel: Recent Labs  Lab 08/29/24 1000 08/30/24 0555 08/31/24 0258 09/01/24 0338 09/02/24 0228  NA 130* 126* 126* 131* 129*  K 4.3 4.2 3.8 4.0 4.5  CL 93* 92* 91* 96* 93*  CO2 25 25 25 25 27   GLUCOSE 235* 149* 141* 136* 139*  BUN 8 10 10 10 9   CREATININE 0.53* 0.58* 0.55* 0.40* 0.63  CALCIUM  8.8* 8.5* 8.5* 8.5* 8.8*  MG  --   --  1.7 1.8 1.9   GFR: Estimated Creatinine Clearance: 83.3 mL/min (by C-G formula based on SCr of 0.63 mg/dL). Liver Function Tests: Recent Labs  Lab 08/30/24 0555  AST 23  ALT 19  ALKPHOS 47  BILITOT 0.9  PROT 6.1*  ALBUMIN 3.2*   No results for input(s): LIPASE, AMYLASE in the last 168 hours. No results for input(s): AMMONIA in the last 168 hours. Coagulation Profile: No results for input(s): INR, PROTIME in the last 168 hours. Cardiac Enzymes: No results for input(s): CKTOTAL, CKMB, CKMBINDEX, TROPONINI in the last 168 hours. BNP (last 3 results) No results for input(s): PROBNP in the last 8760 hours. HbA1C: No results for input(s): HGBA1C in the last 72 hours. CBG: No results for input(s): GLUCAP in the last 168 hours. Lipid Profile: No results for input(s): CHOL, HDL, LDLCALC, TRIG, CHOLHDL, LDLDIRECT in the last 72 hours. Thyroid Function Tests: No results for input(s): TSH, T4TOTAL, FREET4, T3FREE, THYROIDAB in the last 72 hours. Anemia Panel: No results for input(s): VITAMINB12, FOLATE, FERRITIN, TIBC, IRON, RETICCTPCT in the last 72 hours. Sepsis Labs: No results for input(s): PROCALCITON, LATICACIDVEN in the last 168 hours.  Recent Results (from the past 240 hours)  Resp panel by RT-PCR (RSV, Flu A&B, Covid) Anterior Nasal Swab     Status: None   Collection Time: 08/29/24 12:48 PM   Specimen: Anterior Nasal Swab  Result Value Ref Range Status   SARS Coronavirus 2 by  RT PCR NEGATIVE NEGATIVE Final   Influenza A by PCR NEGATIVE NEGATIVE Final   Influenza B by PCR NEGATIVE NEGATIVE Final    Comment: (NOTE) The Xpert Xpress SARS-CoV-2/FLU/RSV plus assay is intended as an aid in the diagnosis of influenza from Nasopharyngeal swab specimens and should not be used as a sole basis for treatment. Nasal washings and aspirates are unacceptable for Xpert Xpress SARS-CoV-2/FLU/RSV testing.  Fact Sheet for Patients: BloggerCourse.com  Fact Sheet for Healthcare Providers: SeriousBroker.it  This test is not yet approved or cleared by the [place]  FDA and has been authorized for detection and/or diagnosis of SARS-CoV-2 by FDA under an Emergency Use Authorization (EUA). This EUA will remain in effect (meaning this test can be used) for the duration of the COVID-19  declaration under Section 564(b)(1) of the Act, 21 U.S.C. section 360bbb-3(b)(1), unless the authorization is terminated or revoked.     Resp Syncytial Virus by PCR NEGATIVE NEGATIVE Final    Comment: (NOTE) Fact Sheet for Patients: BloggerCourse.com  Fact Sheet for Healthcare Providers: SeriousBroker.it  This test is not yet approved or cleared by the [place]  FDA and has been authorized for detection and/or diagnosis of SARS-CoV-2 by FDA under an Emergency Use Authorization (EUA). This EUA will remain in effect (meaning this test can be used) for the duration of the COVID-19 declaration under Section 564(b)(1) of the Act, 21 U.S.C. section 360bbb-3(b)(1), unless the authorization is terminated or revoked.  Performed at [facility], [address], [place], [place] [address]          Radiology Studies: CT CHEST WO CONTRAST Result Date: 09/02/2024 EXAM: CT CHEST WITHOUT CONTRAST 09/02/2024 09:41:20 AM TECHNIQUE: CT of the chest was performed without the administration of  intravenous contrast. Multiplanar reformatted images are provided for review. Automated exposure control, iterative reconstruction, and/or weight based adjustment of the mA/kV was utilized to reduce the radiation dose to as low as reasonably achievable. COMPARISON: CT angio chest 08/22/2024. CLINICAL HISTORY: Persistent pneumothorax. FINDINGS: MEDIASTINUM: Normal heart size. No pericardial effusion. Coronary artery calcifications. Aortic atherosclerosis. Central airways are patent. A few small foci of pneumomediastinum noted along the right inferior heart border (axial image 98/2). LYMPH NODES: Prominent mediastinal lymph nodes, nonpathologically enlarged by size criteria, measuring up to 1.4 cm. Unchanged from previous exam. This is a nonspecific finding in the setting of chronic interstitial lung disease and likely reactive. Hilar lymph nodes are suboptimally visualized due to lack of IV contrast material. No axillary lymphadenopathy. LUNGS AND PLEURA: Right chest wall pigtail thoracostomy tube is identified, which enters from a ventral chest wall approach. The pigtail terminates over the anterior aspect of the inferior right upper lobe. Persistent right pneumothorax is identified which appears mild-to-moderate in volume. Signs of chronic interstitial lung disease are again noted with diffuse peripheral predominant interstitial reticulation and basilar subpleural honeycombing. Traction bronchiectasis is noted. Unchanged large thin-walled cyst in the superior segment of the right lower lobe. No focal consolidation or pulmonary edema. 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 No acute abnormality of the bones. UPPER ABDOMEN: Limited images of the upper abdomen demonstrate calcifications within the pancreas, compatible with chronic pancreatitis. No acute abnormality within the imaged portions of the upper abdomen. IMPRESSION: 1.  Persistent right pneumothorax, mild-to-moderate in volume, with right chest wall pigtail thoracostomy tube in place. 2. A few small foci of pneumomediastinum along the right inferior heart border. 3. Signs of chronic interstitial lung disease compatible with usual interstitial pneumonia as characterized by previous high-resolution CT of the chest. 4. Aortic atherosclerotic calcification and coronary artery calcification. Electronically signed by: Waddell Calk MD 09/02/2024 10:04 AM EDT RP Workstation: [id number]   DG CHEST PORT 1 VIEW Result Date: 09/02/2024 CLINICAL DATA:  [id number] Pneumothorax [id number]. EXAM: PORTABLE CHEST 1 VIEW COMPARISON:  09/01/2024. FINDINGS: Continued interval decrease in the right apicolateral pneumothorax when compared to the prior radiograph. There are atelectatic changes at the lung bases. Redemonstration of right-sided pleural drainage catheter which appears in different position. Bilateral lung fields are otherwise clear. No acute consolidation or lung collapse. No left pneumothorax. Bilateral costophrenic angles are clear. Stable cardio-mediastinal silhouette. No acute osseous abnormalities. The soft tissues are within normal limits. IMPRESSION: Continued interval decrease  in the right apicolateral pneumothorax when compared to the prior radiograph. Electronically Signed   By: Ree Molt M.D.   On: 09/02/2024 09:26   DG CHEST PORT 1 VIEW Result Date: 09/01/2024 CLINICAL DATA:  [id number] Pneumothorax [id number]. EXAM: PORTABLE CHEST 1 VIEW COMPARISON:  09/01/2024, 8:06 a.m. FINDINGS: Low lung volume. Redemonstration of mild-to-moderate coarse bronchovascular markings, similar to the prior study. Right-sided pleural drainage catheter noted. There is small right apicolateral pneumothorax, oral slightly decreased since the prior study. No mediastinal shift. Atelectatic changes noted at the lung bases. Bilateral lung fields are otherwise grossly clear. No acute consolidation or lung  collapse. Bilateral costophrenic angles are clear. Stable cardio-mediastinal silhouette. No acute osseous abnormalities. The soft tissues are within normal limits. IMPRESSION: *Small right apicolateral pneumothorax, slightly decreased since the prior study. No mediastinal shift. Electronically Signed   By: Ree Molt M.D.   On: 09/01/2024 11:22   DG CHEST PORT 1 VIEW Result Date: 09/01/2024 CLINICAL DATA:  Pneumothorax. EXAM: PORTABLE CHEST 1 VIEW COMPARISON:  August 31, 2024 FINDINGS: There is stable right-sided chest tube positioning. A small to moderate size right-sided pneumothorax is seen. This measures 2.2 cm at the apex and is increased in size when compared to the prior. Heart size and mediastinal contours are within normal limits. Stable bilateral airspace opacities are noted. No pleural effusion is seen. The visualized skeletal structures are unremarkable. IMPRESSION: 1. Small to moderate size right-sided pneumothorax, increased in size when compared to the prior study. 2. Stable bilateral airspace opacities. Electronically Signed   By: Suzen Dials M.D.   On: 09/01/2024 10:21           LOS: 4 days   Time spent= 35 mins    Burgess JAYSON Dare, MD [facility]  If 7PM-7AM, please contact night-coverage  09/02/2024, 10:24 AM

## 2024-09-03 ENCOUNTER — Other Ambulatory Visit: Payer: Self-pay

## 2024-09-03 ENCOUNTER — Inpatient Hospital Stay (HOSPITAL_COMMUNITY)

## 2024-09-03 DIAGNOSIS — J9383 Other pneumothorax: Secondary | ICD-10-CM | POA: Diagnosis not present

## 2024-09-03 DIAGNOSIS — J849 Interstitial pulmonary disease, unspecified: Secondary | ICD-10-CM | POA: Diagnosis not present

## 2024-09-03 DIAGNOSIS — J439 Emphysema, unspecified: Secondary | ICD-10-CM | POA: Diagnosis not present

## 2024-09-03 DIAGNOSIS — J8410 Pulmonary fibrosis, unspecified: Secondary | ICD-10-CM | POA: Diagnosis not present

## 2024-09-03 DIAGNOSIS — J939 Pneumothorax, unspecified: Secondary | ICD-10-CM | POA: Diagnosis not present

## 2024-09-03 LAB — CBC
HCT: 41.2 % (ref 39.0–52.0)
Hemoglobin: 13.7 g/dL (ref 13.0–17.0)
MCH: 26.9 pg (ref 26.0–34.0)
MCHC: 33.3 g/dL (ref 30.0–36.0)
MCV: 80.8 fL (ref 80.0–100.0)
Platelets: 182 K/uL (ref 150–400)
RBC: 5.1 MIL/uL (ref 4.22–5.81)
RDW: 13.2 % (ref 11.5–15.5)
WBC: 14.4 K/uL — ABNORMAL HIGH (ref 4.0–10.5)
nRBC: 0 % (ref 0.0–0.2)

## 2024-09-03 LAB — BASIC METABOLIC PANEL WITH GFR
Anion gap: 9 (ref 5–15)
BUN: 14 mg/dL (ref 8–23)
CO2: 25 mmol/L (ref 22–32)
Calcium: 8.6 mg/dL — ABNORMAL LOW (ref 8.9–10.3)
Chloride: 96 mmol/L — ABNORMAL LOW (ref 98–111)
Creatinine, Ser: 0.53 mg/dL — ABNORMAL LOW (ref 0.61–1.24)
GFR, Estimated: >60 mL/min (ref >60)
Glucose, Bld: 143 mg/dL — ABNORMAL HIGH (ref 70–99)
Potassium: 4.4 mmol/L (ref 3.5–5.1)
Sodium: 130 mmol/L — ABNORMAL LOW (ref 135–145)

## 2024-09-03 LAB — MAGNESIUM: Magnesium: 1.9 mg/dL (ref 1.7–2.4)

## 2024-09-03 NOTE — Consult Note (Addendum)
 Reason for Consult:pneumothorax Referring Physician: hospitalist  Vincent Meyers is an 78 y.o. male.  HPI: We are asked to see this 78 year old male in thoracic surgical consultation for a persistent pneumothorax.  The patient has significant pulmonary history including  ILD/pulm fibrosis and COPD.  Other significant medical comorbidities include obstructive sleep apnea, hypertension, hyperlipidemia and diabetes.  He presented to the emergency department on 08/29/2024 with worsening shortness of breath.  He had presented there a week earlier when he developed a right sided chest pain and was treated with anti-inflammatories with good relief.  On we presentation he was found on chest x-ray to have a large right pneumothorax.  A chest tube was placed in the emergency department and he was admitted for further evaluation and treatment to the hospitalist service.  PCCM was consulted to assist with chest tube management.  He continues to have a persistent airleak and we are asked to give opinion on potential surgical intervention.  His most recent x-ray yesterday shows improved pneumothorax and today he does not have an air leak.  A CT scan has been done yesterday and results are as described below.  Past Medical History:  Diagnosis Date   Acid reflux    Angina pectoris    Anxiety    Arthritis    Coronary artery disease    Enlarged prostate    per [facility] new patient packet   Erectile dysfunction    High blood sugar    per [facility] new patient packet   Hyperlipidemia    Hypertension    IBS (irritable bowel syndrome)    ILD (interstitial lung disease) (HCC)    OSA (obstructive sleep apnea)    Pulmonary fibrosis (HCC)    per [facility] new patient packet    Past Surgical History:  Procedure Laterality Date   APPENDECTOMY     COLON SURGERY     perforated bowel and hernia repair   COLONOSCOPY  2024   per [facility] new patient packet   COLONOSCOPY N/A 05/17/2024   Procedure: COLONOSCOPY;  Surgeon: Legrand Victory LITTIE DOUGLAS, MD;  Location: [facility];  Service: Gastroenterology;  Laterality: N/A;   HEMOSTASIS CLIP PLACEMENT  05/17/2024   Procedure: CONTROL OF HEMORRHAGE, GI TRACT, ENDOSCOPIC, BY CLIPPING OR OVERSEWING;  Surgeon: Legrand Victory LITTIE DOUGLAS, MD;  Location: [facility];  Service: Gastroenterology;;   ILIAC ARTERY ANEURYSM REPAIR Left 2006   Dr. Oris (redo surgery)   POLYPECTOMY  05/17/2024   Procedure: POLYPECTOMY, INTESTINE;  Surgeon: Legrand Victory LITTIE DOUGLAS, MD;  Location: [facility];  Service: Gastroenterology;;   REPLACEMENT TOTAL KNEE Left 2008   Dr.Yeats, per [facility] new patient packet   REPLACEMENT TOTAL KNEE Right 2018   Dr.J, per [facility] new patient packet   REPLACEMENT TOTAL KNEE BILATERAL     RIGHT HEART CATH N/A 09/09/2023   Procedure: RIGHT HEART CATH;  Surgeon: Rolan Ezra RAMAN, MD;  Location: [facility] INVASIVE CV LAB;  Service: Cardiovascular;  Laterality: N/A;   TONSILLECTOMY     age 9    Family History  Problem Relation Age of Onset   Parkinson's disease Mother    Heart attack Father    Stroke Father    Alzheimer's disease Father    Alzheimer's disease Sister    Other Sister        degenerative muscle disease   Hypertension Daughter    Obesity Daughter    Heart murmur Daughter    Colon cancer Neg Hx    Esophageal cancer Neg  Hx    Rectal cancer Neg Hx    Stomach cancer Neg Hx     Social History:  reports that he quit smoking about 36 years ago. His smoking use included cigarettes. He started smoking about 59 years ago. He has a 46 pack-year smoking history. He has been exposed to tobacco smoke. He has never used smokeless tobacco. He reports that he does not currently use alcohol after a past usage of about 4.0 - 5.0 standard drinks of alcohol per week. He reports that he does not use drugs.  Allergies: No Known Allergies   amLODipine   5 mg Oral Daily   aspirin EC  81 mg Oral Daily   atenolol   75 mg Oral Daily   fluticasone   2 spray Each Nare Daily   heparin   5,000 Units  Subcutaneous Q8H   lisinopril   40 mg Oral Daily   loratadine  10 mg Oral Daily   pantoprazole  40 mg Oral Daily   Pirfenidone   801 mg Oral TID WC   psyllium  1 packet Oral Daily   rosuvastatin   10 mg Oral Daily   sodium chloride  flush  10 mL Intrapleural Q8H    Results for orders placed or performed during the hospital encounter of 08/29/24 (from the past 48 hours)  Basic metabolic panel with GFR     Status: Abnormal   Collection Time: 09/02/24  2:28 AM  Result Value Ref Range   Sodium 129 (L) 135 - 145 mmol/L   Potassium 4.5 3.5 - 5.1 mmol/L   Chloride 93 (L) 98 - 111 mmol/L   CO2 27 22 - 32 mmol/L   Glucose, Bld 139 (H) 70 - 99 mg/dL    Comment: Glucose reference range applies only to samples taken after fasting for at least 8 hours.   BUN 9 8 - 23 mg/dL   Creatinine, Ser 9.36 0.61 - 1.24 mg/dL   Calcium  8.8 (L) 8.9 - 10.3 mg/dL   GFR, Estimated >39 >39 mL/min    Comment: (NOTE) Calculated using the CKD-EPI Creatinine Equation (2021)    Anion gap 9 5 - 15    Comment: Performed at [facility], [address] [place], [place] [address]  CBC     Status: Abnormal   Collection Time: 09/02/24  2:28 AM  Result Value Ref Range   WBC 14.4 (H) 4.0 - 10.5 K/uL   RBC 5.29 4.22 - 5.81 MIL/uL   Hemoglobin 14.2 13.0 - 17.0 g/dL   HCT 56.9 60.9 - 47.9 %   MCV 81.3 80.0 - 100.0 fL   MCH 26.8 26.0 - 34.0 pg   MCHC 33.0 30.0 - 36.0 g/dL   RDW 86.7 88.4 - 84.4 %   Platelets 197 150 - 400 K/uL   nRBC 0.0 0.0 - 0.2 %    Comment: Performed at [facility], [address] [place], [place] [address]  Magnesium     Status: None   Collection Time: 09/02/24  2:28 AM  Result Value Ref Range   Magnesium 1.9 1.7 - 2.4 mg/dL    Comment: Performed at [facility], [address] [place], [place] [address]  Basic metabolic panel with GFR     Status: Abnormal   Collection Time: 09/03/24  3:27 AM  Result Value Ref Range   Sodium 130 (L) 135 - 145 mmol/L   Potassium 4.4 3.5 -  5.1 mmol/L   Chloride 96 (L) 98 - 111 mmol/L  CO2 25 22 - 32 mmol/L   Glucose, Bld 143 (H) 70 - 99 mg/dL    Comment: Glucose reference range applies only to samples taken after fasting for at least 8 hours.   BUN 14 8 - 23 mg/dL   Creatinine, Ser 9.46 (L) 0.61 - 1.24 mg/dL   Calcium  8.6 (L) 8.9 - 10.3 mg/dL   GFR, Estimated >39 >39 mL/min    Comment: (NOTE) Calculated using the CKD-EPI Creatinine Equation (2021)    Anion gap 9 5 - 15    Comment: Performed at [facility], [address] [place], [place] [address]  CBC     Status: Abnormal   Collection Time: 09/03/24  3:27 AM  Result Value Ref Range   WBC 14.4 (H) 4.0 - 10.5 K/uL   RBC 5.10 4.22 - 5.81 MIL/uL   Hemoglobin 13.7 13.0 - 17.0 g/dL   HCT 58.7 60.9 - 47.9 %   MCV 80.8 80.0 - 100.0 fL   MCH 26.9 26.0 - 34.0 pg   MCHC 33.3 30.0 - 36.0 g/dL   RDW 86.7 88.4 - 84.4 %   Platelets 182 150 - 400 K/uL   nRBC 0.0 0.0 - 0.2 %    Comment: Performed at [facility], [address], [place], [place] [address]  Magnesium     Status: None   Collection Time: 09/03/24  3:27 AM  Result Value Ref Range   Magnesium 1.9 1.7 - 2.4 mg/dL    Comment: Performed at [facility], [address], [place], [place] [address]    CT CHEST WO CONTRAST Result Date: 09/02/2024 EXAM: CT CHEST WITHOUT CONTRAST 09/02/2024 09:41:20 AM TECHNIQUE: CT of the chest was performed without the administration of intravenous contrast. Multiplanar reformatted images are provided for review. Automated exposure control, iterative reconstruction, and/or weight based adjustment of the mA/kV was utilized to reduce the radiation dose to as low as reasonably achievable. COMPARISON: CT angio chest 08/22/2024. CLINICAL HISTORY: Persistent pneumothorax. FINDINGS: MEDIASTINUM: Normal heart size. No pericardial effusion. Coronary artery calcifications. Aortic atherosclerosis. Central airways are patent. A few small foci of pneumomediastinum noted along the  right inferior heart border (axial image 98/2). LYMPH NODES: Prominent mediastinal lymph nodes, nonpathologically enlarged by size criteria, measuring up to 1.4 cm. Unchanged from previous exam. This is a nonspecific finding in the setting of chronic interstitial lung disease and likely reactive. Hilar lymph nodes are suboptimally visualized due to lack of IV contrast material. No axillary lymphadenopathy. LUNGS AND PLEURA: Right chest wall pigtail thoracostomy tube is identified, which enters from a ventral chest wall approach. The pigtail terminates over the anterior aspect of the inferior right upper lobe. Persistent right pneumothorax is identified which appears mild-to-moderate in volume. Signs of chronic interstitial lung disease are again noted with diffuse peripheral predominant interstitial reticulation and basilar subpleural honeycombing. Traction bronchiectasis is noted. Unchanged large thin-walled cyst in the superior segment of the right lower lobe. No focal consolidation or pulmonary edema. 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 No acute abnormality of the bones. UPPER ABDOMEN: Limited images of the upper abdomen demonstrate calcifications within the pancreas, compatible with chronic pancreatitis. No acute abnormality within the imaged portions of the upper abdomen. IMPRESSION: 1. Persistent right pneumothorax, mild-to-moderate in volume, with right chest wall pigtail thoracostomy tube in place. 2. A few small foci of pneumomediastinum along the right inferior heart border. 3. Signs of chronic  interstitial lung disease compatible with usual interstitial pneumonia as characterized by previous high-resolution CT of the chest. 4. Aortic atherosclerotic calcification and coronary artery calcification. Electronically signed by: Waddell Calk MD 09/02/2024 10:04 AM EDT RP Workstation: [id number]   DG CHEST PORT 1  VIEW Result Date: 09/02/2024 CLINICAL DATA:  [id number] Pneumothorax [id number]. EXAM: PORTABLE CHEST 1 VIEW COMPARISON:  09/01/2024. FINDINGS: Continued interval decrease in the right apicolateral pneumothorax when compared to the prior radiograph. There are atelectatic changes at the lung bases. Redemonstration of right-sided pleural drainage catheter which appears in different position. Bilateral lung fields are otherwise clear. No acute consolidation or lung collapse. No left pneumothorax. Bilateral costophrenic angles are clear. Stable cardio-mediastinal silhouette. No acute osseous abnormalities. The soft tissues are within normal limits. IMPRESSION: Continued interval decrease in the right apicolateral pneumothorax when compared to the prior radiograph. Electronically Signed   By: Ree Molt M.D.   On: 09/02/2024 09:26   DG CHEST PORT 1 VIEW Result Date: 09/01/2024 CLINICAL DATA:  [id number] Pneumothorax [id number]. EXAM: PORTABLE CHEST 1 VIEW COMPARISON:  09/01/2024, 8:06 a.m. FINDINGS: Low lung volume. Redemonstration of mild-to-moderate coarse bronchovascular markings, similar to the prior study. Right-sided pleural drainage catheter noted. There is small right apicolateral pneumothorax, oral slightly decreased since the prior study. No mediastinal shift. Atelectatic changes noted at the lung bases. Bilateral lung fields are otherwise grossly clear. No acute consolidation or lung collapse. Bilateral costophrenic angles are clear. Stable cardio-mediastinal silhouette. No acute osseous abnormalities. The soft tissues are within normal limits. IMPRESSION: *Small right apicolateral pneumothorax, slightly decreased since the prior study. No mediastinal shift. Electronically Signed   By: Ree Molt M.D.   On: 09/01/2024 11:22   DG CHEST PORT 1 VIEW Result Date: 09/01/2024 CLINICAL DATA:  Pneumothorax. EXAM: PORTABLE CHEST 1 VIEW COMPARISON:  August 31, 2024 FINDINGS: There is stable right-sided chest tube  positioning. A small to moderate size right-sided pneumothorax is seen. This measures 2.2 cm at the apex and is increased in size when compared to the prior. Heart size and mediastinal contours are within normal limits. Stable bilateral airspace opacities are noted. No pleural effusion is seen. The visualized skeletal structures are unremarkable. IMPRESSION: 1. Small to moderate size right-sided pneumothorax, increased in size when compared to the prior study. 2. Stable bilateral airspace opacities. Electronically Signed   By: Suzen Dials M.D.   On: 09/01/2024 10:21    Review of Systems  Constitutional:  Positive for fatigue.  HENT:  Positive for hearing loss.   Respiratory:  Positive for cough.   Cardiovascular:  Positive for chest pain and leg swelling.  Gastrointestinal:  Positive for constipation and diarrhea.  Genitourinary:  Positive for decreased urine volume and frequency.  Musculoskeletal:  Positive for arthralgias.  Psychiatric/Behavioral:  Positive for dysphoric mood. The patient is nervous/anxious.    Blood pressure 137/64, pulse 63, temperature 98.4 F (36.9 C), temperature source Oral, resp. rate 16, weight 91.3 kg, SpO2 97%. Physical Exam Constitutional:      General: He is not in acute distress.    Appearance: He is well-developed.  HENT:     Head: Normocephalic and atraumatic.     Mouth/Throat:     Pharynx: No pharyngeal swelling or oropharyngeal exudate.  Eyes:     Extraocular Movements: Extraocular movements intact.     Pupils: Pupils are equal, round, and reactive to light.  Neck:     Thyroid: No thyromegaly.     Vascular: No JVD.  Cardiovascular:  Rate and Rhythm: Normal rate and regular rhythm.     Pulses: Normal pulses.  Pulmonary:     Effort: Pulmonary effort is normal.     Comments: Slightly coarse throughout Chest:     Chest wall: No mass, tenderness or crepitus.  Abdominal:     General: Bowel sounds are normal.     Palpations: Abdomen is  soft.  Musculoskeletal:     Right lower leg: No edema.     Left lower leg: No edema.  Lymphadenopathy:     Cervical: No cervical adenopathy.  Skin:    General: Skin is warm and dry.     Coloration: Skin is pale. Skin is not cyanotic.  Neurological:     Mental Status: He is alert.     Comments: Grossly non focal  Psychiatric:        Mood and Affect: Mood normal.        Behavior: Behavior normal. Behavior is not agitated.     Assessment/Plan: Spontaneous pneumothorax in the setting of severe pulmonary fibrosis/ILD.  He does have a large emphysematous bleb as well. He does appear to be having improvement with current chest tube management.  Due to the severity of his lung disease it would be ideal to avoid surgical intervention if possible.  Surgeon will evaluate the patient and relevant studies.  He will make final determinations in terms of surgical direction of care.  Wayne E Gold PA-C 09/03/2024, 7:56 AM    Agree Based off of his underlying interstial disease, and previous PFTs, he likely would not tolerate a wedge resection, and pleurodesis.  Continue CT management for now.    Hale Chalfin MALVA Rayas

## 2024-09-03 NOTE — Plan of Care (Signed)
   Problem: Education: Goal: Knowledge of General Education information will improve Description: Including pain rating scale, medication(s)/side effects and non-pharmacologic comfort measures Outcome: Progressing   Problem: Clinical Measurements: Goal: Diagnostic test results will improve Outcome: Progressing   Problem: Activity: Goal: Risk for activity intolerance will decrease Outcome: Progressing   Problem: Elimination: Goal: Will not experience complications related to bowel motility Outcome: Progressing

## 2024-09-03 NOTE — Progress Notes (Signed)
 PT Cancellation Note  Patient Details Name: Vincent Meyers MRN: [id number] DOB: [birth year]   Cancelled Treatment:    Reason Eval/Treat Not Completed: Patient declined, no reason specified (pt declines activity stating he walked earlier with mobility, wants a nap and not to walk until 3pm. Pt aware this therapist leaves before then and pt deferred to next date)   Vincent Meyers Vincent Meyers 09/03/2024, 12:21 PM Vincent Meyers, PT Acute Rehabilitation Services Office: [phone]

## 2024-09-03 NOTE — Progress Notes (Signed)
 PROGRESS NOTE    Vincent Meyers  FMW:[id number] DOB: 1946-04-26 DOA: 08/29/2024 PCP: [name], NP    Brief Narrative:  78 year old with history of ILD, COPD, OSA, HTN, HLD, DM2 comes to the ED with shortness of breath.  Chest x-ray showed large right-sided spontaneous pneumothorax requiring chest tube placement.  Pulmonary team has been consulted.  Recurrent pneumothorax despite chest tube to suction.  CT chest planned, likely will require thoracic consult.   Assessment & Plan:  Acute respiratory distress secondary to right-sided large pneumothorax - Chest x-ray showed large right-sided spontaneous pneumothorax requiring chest tube placement.  Pulmonary following.  Recurrent pneumothorax despite chest tube to suction.  CT chest showing moderate-sized pneumothorax, recurrent. CTCS consulted.   History of ILD COPD - Follows with Dr. Bubba on home pirfenidone , bronchodilators  Essential hypertension - On amlodipine ,  HCTZ, lisinopril .  IV as needed  Hyperlipidemia - Statin  CAD/PAD - Aspirin and statin.  Currently chest pain-free  GERD - PPI  DM2 - Sliding scale and Accu-Cheks..-  Hyponatremia - From hypovolemia.  Stop HCTZ  DVT prophylaxis: heparin  injection 5,000 Units Start: 08/30/24 0600 Place and maintain sequential compression device Start: 08/29/24 1443      Code Status: Full Code Family Communication:  [employer]; wrong number Status is: Inpatient Remains inpatient appropriate because: Ongoing management for pneumothorax Continue hospital stay until cleared by pulmonary team & CTCTS  PT Follow up Recs:   Subjective: Doing ok.  Examination:  General exam: Appears calm and comfortable, elderly frail Respiratory system: Clear to auscultation. Respiratory effort normal. Cardiovascular system: S1 & S2 heard, RRR. No JVD, murmurs, rubs, gallops or clicks. No pedal edema. Gastrointestinal system: Abdomen is nondistended, soft and nontender. No  organomegaly or masses felt. Normal bowel sounds heard. Central nervous system: Alert and oriented. No focal neurological deficits. Extremities: Symmetric 5 x 5 power. Skin: No rashes, lesions or ulcers Psychiatry: Judgement and insight appear normal. Mood & affect appropriate. Chest tube in place               Diet Orders (From admission, onward)     Start     Ordered   08/29/24 1349  Diet heart healthy/carb modified Room service appropriate? Yes; Fluid consistency: Thin  Diet effective now       Question Answer Comment  Diet-HS Snack? Nothing   Room service appropriate? Yes   Fluid consistency: Thin      08/29/24 1350            Objective: Vitals:   09/03/24 0015 09/03/24 0200 09/03/24 0409 09/03/24 0748  BP: (!) 124/51  137/64   Pulse: 64 64 63   Resp: 16  16 16   Temp: 97.9 F (36.6 C)  98.5 F (36.9 C) 98.4 F (36.9 C)  TempSrc: Oral  Axillary Oral  SpO2: 100% 96% 97%   Weight:        Intake/Output Summary (Last 24 hours) at 09/03/2024 1045 Last data filed at 09/03/2024 0907 Gross per 24 hour  Intake 494 ml  Output 2230 ml  Net -1736 ml   Filed Weights   09/01/24 0627  Weight: 91.3 kg    Scheduled Meds:  amLODipine   5 mg Oral Daily   aspirin EC  81 mg Oral Daily   atenolol   75 mg Oral Daily   fluticasone   2 spray Each Nare Daily   heparin   5,000 Units Subcutaneous Q8H   lisinopril   40 mg Oral Daily   loratadine  10  mg Oral Daily   pantoprazole  40 mg Oral Daily   Pirfenidone   801 mg Oral TID WC   psyllium  1 packet Oral Daily   rosuvastatin   10 mg Oral Daily   sodium chloride  flush  10 mL Intrapleural Q8H   Continuous Infusions:  Nutritional status     Body mass index is 31.51 kg/m.  Data Reviewed:   CBC: Recent Labs  Lab 08/30/24 0555 08/31/24 0258 09/01/24 0338 09/02/24 0228 09/03/24 0327  WBC 14.8* 14.8* 14.5* 14.4* 14.4*  HGB 14.2 14.6 14.3 14.2 13.7  HCT 42.5 44.0 43.0 43.0 41.2  MCV 81.6 80.6 80.5 81.3 80.8   PLT 169 187 189 197 182   Basic Metabolic Panel: Recent Labs  Lab 08/30/24 0555 08/31/24 0258 09/01/24 0338 09/02/24 0228 09/03/24 0327  NA 126* 126* 131* 129* 130*  K 4.2 3.8 4.0 4.5 4.4  CL 92* 91* 96* 93* 96*  CO2 25 25 25 27 25   GLUCOSE 149* 141* 136* 139* 143*  BUN 10 10 10 9 14   CREATININE 0.58* 0.55* 0.40* 0.63 0.53*  CALCIUM  8.5* 8.5* 8.5* 8.8* 8.6*  MG  --  1.7 1.8 1.9 1.9   GFR: Estimated Creatinine Clearance: 83.3 mL/min (A) (by C-G formula based on SCr of 0.53 mg/dL (L)). Liver Function Tests: Recent Labs  Lab 08/30/24 0555  AST 23  ALT 19  ALKPHOS 47  BILITOT 0.9  PROT 6.1*  ALBUMIN 3.2*   No results for input(s): LIPASE, AMYLASE in the last 168 hours. No results for input(s): AMMONIA in the last 168 hours. Coagulation Profile: No results for input(s): INR, PROTIME in the last 168 hours. Cardiac Enzymes: No results for input(s): CKTOTAL, CKMB, CKMBINDEX, TROPONINI in the last 168 hours. BNP (last 3 results) No results for input(s): PROBNP in the last 8760 hours. HbA1C: No results for input(s): HGBA1C in the last 72 hours. CBG: No results for input(s): GLUCAP in the last 168 hours. Lipid Profile: No results for input(s): CHOL, HDL, LDLCALC, TRIG, CHOLHDL, LDLDIRECT in the last 72 hours. Thyroid Function Tests: No results for input(s): TSH, T4TOTAL, FREET4, T3FREE, THYROIDAB in the last 72 hours. Anemia Panel: No results for input(s): VITAMINB12, FOLATE, FERRITIN, TIBC, IRON, RETICCTPCT in the last 72 hours. Sepsis Labs: No results for input(s): PROCALCITON, LATICACIDVEN in the last 168 hours.  Recent Results (from the past 240 hours)  Resp panel by RT-PCR (RSV, Flu A&B, Covid) Anterior Nasal Swab     Status: None   Collection Time: 08/29/24 12:48 PM   Specimen: Anterior Nasal Swab  Result Value Ref Range Status   SARS Coronavirus 2 by RT PCR NEGATIVE NEGATIVE Final   Influenza A by  PCR NEGATIVE NEGATIVE Final   Influenza B by PCR NEGATIVE NEGATIVE Final    Comment: (NOTE) The Xpert Xpress SARS-CoV-2/FLU/RSV plus assay is intended as an aid in the diagnosis of influenza from Nasopharyngeal swab specimens and should not be used as a sole basis for treatment. Nasal washings and aspirates are unacceptable for Xpert Xpress SARS-CoV-2/FLU/RSV testing.  Fact Sheet for Patients: BloggerCourse.com  Fact Sheet for Healthcare Providers: SeriousBroker.it  This test is not yet approved or cleared by the [place]  FDA and has been authorized for detection and/or diagnosis of SARS-CoV-2 by FDA under an Emergency Use Authorization (EUA). This EUA will remain in effect (meaning this test can be used) for the duration of the COVID-19 declaration under Section 564(b)(1) of the Act, 21 U.S.C. section 360bbb-3(b)(1), unless the authorization is  terminated or revoked.     Resp Syncytial Virus by PCR NEGATIVE NEGATIVE Final    Comment: (NOTE) Fact Sheet for Patients: BloggerCourse.com  Fact Sheet for Healthcare Providers: SeriousBroker.it  This test is not yet approved or cleared by the [place]  FDA and has been authorized for detection and/or diagnosis of SARS-CoV-2 by FDA under an Emergency Use Authorization (EUA). This EUA will remain in effect (meaning this test can be used) for the duration of the COVID-19 declaration under Section 564(b)(1) of the Act, 21 U.S.C. section 360bbb-3(b)(1), unless the authorization is terminated or revoked.  Performed at [facility], [address] [place], [place] [address]          Radiology Studies: CT CHEST WO CONTRAST Result Date: 09/02/2024 EXAM: CT CHEST WITHOUT CONTRAST 09/02/2024 09:41:20 AM TECHNIQUE: CT of the chest was performed without the administration of intravenous contrast. Multiplanar reformatted  images are provided for review. Automated exposure control, iterative reconstruction, and/or weight based adjustment of the mA/kV was utilized to reduce the radiation dose to as low as reasonably achievable. COMPARISON: CT angio chest 08/22/2024. CLINICAL HISTORY: Persistent pneumothorax. FINDINGS: MEDIASTINUM: Normal heart size. No pericardial effusion. Coronary artery calcifications. Aortic atherosclerosis. Central airways are patent. A few small foci of pneumomediastinum noted along the right inferior heart border (axial image 98/2). LYMPH NODES: Prominent mediastinal lymph nodes, nonpathologically enlarged by size criteria, measuring up to 1.4 cm. Unchanged from previous exam. This is a nonspecific finding in the setting of chronic interstitial lung disease and likely reactive. Hilar lymph nodes are suboptimally visualized due to lack of IV contrast material. No axillary lymphadenopathy. LUNGS AND PLEURA: Right chest wall pigtail thoracostomy tube is identified, which enters from a ventral chest wall approach. The pigtail terminates over the anterior aspect of the inferior right upper lobe. Persistent right pneumothorax is identified which appears mild-to-moderate in volume. Signs of chronic interstitial lung disease are again noted with diffuse peripheral predominant interstitial reticulation and basilar subpleural honeycombing. Traction bronchiectasis is noted. Unchanged large thin-walled cyst in the superior segment of the right lower lobe. No focal consolidation or pulmonary edema. 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 No acute abnormality of the bones. UPPER ABDOMEN: Limited images of the upper abdomen demonstrate calcifications within the pancreas, compatible with chronic pancreatitis. No acute abnormality within the imaged portions of the upper abdomen. IMPRESSION: 1. Persistent right pneumothorax,  mild-to-moderate in volume, with right chest wall pigtail thoracostomy tube in place. 2. A few small foci of pneumomediastinum along the right inferior heart border. 3. Signs of chronic interstitial lung disease compatible with usual interstitial pneumonia as characterized by previous high-resolution CT of the chest. 4. Aortic atherosclerotic calcification and coronary artery calcification. Electronically signed by: Waddell Calk MD 09/02/2024 10:04 AM EDT RP Workstation: [id number]   DG CHEST PORT 1 VIEW Result Date: 09/02/2024 CLINICAL DATA:  [id number] Pneumothorax [id number]. EXAM: PORTABLE CHEST 1 VIEW COMPARISON:  09/01/2024. FINDINGS: Continued interval decrease in the right apicolateral pneumothorax when compared to the prior radiograph. There are atelectatic changes at the lung bases. Redemonstration of right-sided pleural drainage catheter which appears in different position. Bilateral lung fields are otherwise clear. No acute consolidation or lung collapse. No left pneumothorax. Bilateral costophrenic angles are clear. Stable cardio-mediastinal silhouette. No acute osseous abnormalities. The soft tissues are within normal limits. IMPRESSION: Continued interval decrease in the right apicolateral pneumothorax when compared to the prior radiograph. Electronically Signed  By: Ree Molt M.D.   On: 09/02/2024 09:26   DG CHEST PORT 1 VIEW Result Date: 09/01/2024 CLINICAL DATA:  [id number] Pneumothorax [id number]. EXAM: PORTABLE CHEST 1 VIEW COMPARISON:  09/01/2024, 8:06 a.m. FINDINGS: Low lung volume. Redemonstration of mild-to-moderate coarse bronchovascular markings, similar to the prior study. Right-sided pleural drainage catheter noted. There is small right apicolateral pneumothorax, oral slightly decreased since the prior study. No mediastinal shift. Atelectatic changes noted at the lung bases. Bilateral lung fields are otherwise grossly clear. No acute consolidation or lung collapse. Bilateral costophrenic  angles are clear. Stable cardio-mediastinal silhouette. No acute osseous abnormalities. The soft tissues are within normal limits. IMPRESSION: *Small right apicolateral pneumothorax, slightly decreased since the prior study. No mediastinal shift. Electronically Signed   By: Ree Molt M.D.   On: 09/01/2024 11:22           LOS: 5 days   Time spent= 35 mins    Burgess JAYSON Dare, MD [facility]  If 7PM-7AM, please contact night-coverage  09/03/2024, 10:45 AM

## 2024-09-03 NOTE — Progress Notes (Signed)
 NAME:  Vincent Meyers, MRN:  [id number], DOB:  [birth year], LOS: 5 ADMISSION DATE:  08/29/2024, CONSULTATION DATE: 10/8  REFERRING MD: EMERSON Fusi CHIEF COMPLAINT:  Shortness of breath   Initial History of Present Illness:  Patient is a 78 year old male with significant past medical history of ILD-2 L of oxygen baseline for the last couple years, (follows up with MD Geronimo at [facility] pulmonary outpatient), OSA, COPD, hypertension, HLD, CAD, diabetes, anxiety, and acid reflux who presented to the emergency department at [facility] with worsening shortness of breath.  Initial chest x-ray was completed showing large right pneumothorax and chronic interstitial lung disease changes.  Right chest tube was placed by ED staff.  TRH consulted for admission and PCCM consulted for assistance in chest tube management.   Upon initial assessment of patient within ED, patient hemodynamically stable with some hypertension notable.  Patient on 4 L nasal cannula with O2 sats 94 to 95%, respiratory rate low 20s. Patient reports that had similar pain/discomfort over a week ago but went away. Patient noticed chest pain and eventually SOB development after heavy lifting yesterday afternoon. SOB progressively got worse with exertion. Patient denies dizziness, vertigo, syncope, hemoptysis, fevers, chills, sick contacts. Patient endorses slight increase in cough after incident began to occur.  Upon assessing the [place] drain system of the chest tube-noticeable airleak was evaluated from and upon assessing chest tube site, chest tube was completely out chest.  Immediately called for stat chest x-ray to come to be completed, and placement of split gauze dressing.  Repeat chest x-ray showing significant pneumothorax on right side.  Discussed with patient and wife about emergent placement of second chest tube.  Chest tube placed more anteriorly. Right lung re-expansion noted.   Overnight he did not tolerate waterseal and had  pneumothorax reexpansion. He has a persistent airleak    Pertinent  Medical History   Past Medical History:  Diagnosis Date   Acid reflux    Angina pectoris    Anxiety    Arthritis    Coronary artery disease    Enlarged prostate    per [facility] new patient packet   Erectile dysfunction    High blood sugar    per [facility] new patient packet   Hyperlipidemia    Hypertension    IBS (irritable bowel syndrome)    ILD (interstitial lung disease) (HCC)    OSA (obstructive sleep apnea)    Pulmonary fibrosis (HCC)    per [facility] new patient packet     Significant Hospital Events: Including procedures, antibiotic start and stop dates in addition to other pertinent events   10/8 Pulm Consult for Right Pneumo, Hx of ILD . Chest tube noted to be out and replaced  10/9 Persistent airleak, small apical pneumo on CXR   Interim History / Subjective:  Patient stable, on 3 LNC. Pneumothorax stable No air leak noted today    Objective    Blood pressure (!) 116/58, pulse 67, temperature 98.2 F (36.8 C), temperature source Oral, resp. rate 16, weight 91.3 kg, SpO2 98%.        Intake/Output Summary (Last 24 hours) at 09/03/2024 1351 Last data filed at 09/03/2024 1230 Gross per 24 hour  Intake 741 ml  Output 2630 ml  Net -1889 ml   Filed Weights   09/01/24 0627  Weight: 91.3 kg    Examination: General: Elderly male, obese and not in acute distress HEENT: Normocephalic, atraumatic  CV: Regular rate and rhythm, normal S1, normal S2 Pulm:  Bilateral breath sounds clear Abs: Soft, nontender, nondistended Extremities: No edema Skin: No rash Neuro: A&O x 3  Resolved problem list   Assessment and Plan  Spontaneous pneumothorax secondary to ILD -MD Ramaswamy outpatient for ILD/pulmonary fibrosis -Per patient, has been on 2 L of nasal cannula chronically for approximately 2 years   No air leak today. Chest xray stable. Continue on suction for now. Chest xray in am. If stable with no  airleak will switch to waterseal tomorrow. Ct surgery note reviewed Pt requiring baseline oxygen    History of COPD/OSA on CPAP Chronically on 2 L nasal cannula   Other problems managed by primary: Hypertension Hyperlipidemia Anxiety Diabetes type 2 CAD Acid reflux   Labs   CBC: Recent Labs  Lab 08/30/24 0555 08/31/24 0258 09/01/24 0338 09/02/24 0228 09/03/24 0327  WBC 14.8* 14.8* 14.5* 14.4* 14.4*  HGB 14.2 14.6 14.3 14.2 13.7  HCT 42.5 44.0 43.0 43.0 41.2  MCV 81.6 80.6 80.5 81.3 80.8  PLT 169 187 189 197 182    Basic Metabolic Panel: Recent Labs  Lab 08/30/24 0555 08/31/24 0258 09/01/24 0338 09/02/24 0228 09/03/24 0327  NA 126* 126* 131* 129* 130*  K 4.2 3.8 4.0 4.5 4.4  CL 92* 91* 96* 93* 96*  CO2 25 25 25 27 25   GLUCOSE 149* 141* 136* 139* 143*  BUN 10 10 10 9 14   CREATININE 0.58* 0.55* 0.40* 0.63 0.53*  CALCIUM  8.5* 8.5* 8.5* 8.8* 8.6*  MG  --  1.7 1.8 1.9 1.9   GFR: Estimated Creatinine Clearance: 83.3 mL/min (A) (by C-G formula based on SCr of 0.53 mg/dL (L)). Recent Labs  Lab 08/31/24 0258 09/01/24 0338 09/02/24 0228 09/03/24 0327  WBC 14.8* 14.5* 14.4* 14.4*    Liver Function Tests: Recent Labs  Lab 08/30/24 0555  AST 23  ALT 19  ALKPHOS 47  BILITOT 0.9  PROT 6.1*  ALBUMIN 3.2*   No results for input(s): LIPASE, AMYLASE in the last 168 hours. No results for input(s): AMMONIA in the last 168 hours.  ABG    Component Value Date/Time   HCO3 28.0 09/09/2023 1319   TCO2 29 09/09/2023 1319   O2SAT 76 09/09/2023 1319     Coagulation Profile: No results for input(s): INR, PROTIME in the last 168 hours.  Cardiac Enzymes: No results for input(s): CKTOTAL, CKMB, CKMBINDEX, TROPONINI in the last 168 hours.  HbA1C: Hgb A1c MFr Bld  Date/Time Value Ref Range Status  06/11/2024 02:59 PM 6.6 (H) <5.7 % Final    Comment:    For someone without known diabetes, a hemoglobin A1c value of 6.5% or greater indicates  that they may have  diabetes and this should be confirmed with a follow-up  test. . For someone with known diabetes, a value <7% indicates  that their diabetes is well controlled and a value  greater than or equal to 7% indicates suboptimal  control. A1c targets should be individualized based on  duration of diabetes, age, comorbid conditions, and  other considerations. . Currently, no consensus exists regarding use of hemoglobin A1c for diagnosis of diabetes for children. SABRA   03/02/2024 03:19 PM 7.1 (H) <5.7 % of total Hgb Final    Comment:    For someone without known diabetes, a hemoglobin A1c value of 6.5% or greater indicates that they may have  diabetes and this should be confirmed with a follow-up  test. . For someone with known diabetes, a value <7% indicates  that their diabetes is  well controlled and a value  greater than or equal to 7% indicates suboptimal  control. A1c targets should be individualized based on  duration of diabetes, age, comorbid conditions, and  other considerations. . Currently, no consensus exists regarding use of hemoglobin A1c for diagnosis of diabetes for children. .     CBG: No results for input(s): GLUCAP in the last 168 hours.  Review of Systems:      Past Medical History:  He,  has a past medical history of Acid reflux, Angina pectoris, Anxiety, Arthritis, Coronary artery disease, Enlarged prostate, Erectile dysfunction, High blood sugar, Hyperlipidemia, Hypertension, IBS (irritable bowel syndrome), ILD (interstitial lung disease) (HCC), OSA (obstructive sleep apnea), and Pulmonary fibrosis (HCC).   Surgical History:   Past Surgical History:  Procedure Laterality Date   APPENDECTOMY     COLON SURGERY     perforated bowel and hernia repair   COLONOSCOPY  2024   per [facility] new patient packet   COLONOSCOPY N/A 05/17/2024   Procedure: COLONOSCOPY;  Surgeon: Legrand Victory LITTIE DOUGLAS, MD;  Location: [facility];  Service:  Gastroenterology;  Laterality: N/A;   HEMOSTASIS CLIP PLACEMENT  05/17/2024   Procedure: CONTROL OF HEMORRHAGE, GI TRACT, ENDOSCOPIC, BY CLIPPING OR OVERSEWING;  Surgeon: Legrand Victory LITTIE DOUGLAS, MD;  Location: [facility];  Service: Gastroenterology;;   ILIAC ARTERY ANEURYSM REPAIR Left 2006   Dr. Oris (redo surgery)   POLYPECTOMY  05/17/2024   Procedure: POLYPECTOMY, INTESTINE;  Surgeon: Legrand Victory LITTIE DOUGLAS, MD;  Location: [facility];  Service: Gastroenterology;;   REPLACEMENT TOTAL KNEE Left 2008   Dr.Yeats, per [facility] new patient packet   REPLACEMENT TOTAL KNEE Right 2018   Dr.J, per [facility] new patient packet   REPLACEMENT TOTAL KNEE BILATERAL     RIGHT HEART CATH N/A 09/09/2023   Procedure: RIGHT HEART CATH;  Surgeon: Rolan Ezra RAMAN, MD;  Location: [facility] INVASIVE CV LAB;  Service: Cardiovascular;  Laterality: N/A;   TONSILLECTOMY     age 89     Social History:   reports that he quit smoking about 36 years ago. His smoking use included cigarettes. He started smoking about 59 years ago. He has a 46 pack-year smoking history. He has been exposed to tobacco smoke. He has never used smokeless tobacco. He reports that he does not currently use alcohol after a past usage of about 4.0 - 5.0 standard drinks of alcohol per week. He reports that he does not use drugs.   Family History:  His family history includes Alzheimer's disease in his father and sister; Heart attack in his father; Heart murmur in his daughter; Hypertension in his daughter; Obesity in his daughter; Other in his sister; Parkinson's disease in his mother; Stroke in his father. There is no history of Colon cancer, Esophageal cancer, Rectal cancer, or Stomach cancer.   Allergies No Known Allergies   Home Medications  Prior to Admission medications   Medication Sig Start Date End Date Taking? Authorizing Provider  amLODipine  (NORVASC ) 5 MG tablet TAKE 1 TABLET (5 MG TOTAL) BY MOUTH DAILY. 05/28/24 05/23/25  [name], NP  aspirin  EC 81 MG tablet Take 81 mg by mouth daily.    [provider]  atenolol  (TENORMIN ) 50 MG tablet Take 1.5 tablets (75 mg total) by mouth daily. 06/07/24   Fargo, Amy E, NP  Coenzyme Q10 (COQ10 PO) Take 500 mg by mouth daily.    [provider]  FIBER PO Take 4 capsules by mouth 2 (two)  times daily.    [provider]  Flaxseed, Linseed, (FLAXSEED OIL PO) Take 1 capsule by mouth daily.    [provider]  fluticasone  (FLONASE ) 50 MCG/ACT nasal spray Place 2 sprays into both nostrils daily. 12/27/22   [provider]  hydrochlorothiazide  (HYDRODIURIL ) 25 MG tablet TAKE 1 TABLET BY MOUTH EVERY DAY 04/04/24   Eubanks, Jessica K, NP  lisinopril  (ZESTRIL ) 40 MG tablet Take 1 tablet (40 mg total) by mouth daily. 06/07/24   Fargo, Amy E, NP  loratadine (CLARITIN) 10 MG tablet Take 10 mg by mouth daily.    [provider]  MAGNESIUM-POTASSIUM PO Take 1 tablet by mouth daily. With zinc Patient not taking: Reported on 06/11/2024    [provider]  naproxen (NAPROSYN) 500 MG tablet Take 1 tablet (500 mg total) by mouth 2 (two) times daily. 08/22/24   Bauer, Collin S, PA-C  omeprazole  (PRILOSEC) 20 MG capsule TAKE 1 CAPSULE BY MOUTH EVERY DAY 08/22/24   Eubanks, Jessica K, NP  Pirfenidone  801 MG TABS Take 1 tablet (801 mg total) by mouth 3 (three) times daily with meals. 06/08/24   Geronimo Amel, MD  rosuvastatin  (CRESTOR ) 10 MG tablet TAKE 1 TABLET BY MOUTH EVERY DAY 04/27/24   Croitoru, Mihai, MD  tadalafil  (CIALIS ) 20 MG tablet Take 1 tablet (20 mg total) by mouth daily as needed. 08/22/23   Stoneking, Adine PARAS., MD  TURMERIC-GINGER PO Take 2,600 mg by mouth daily.    [provider]  UNABLE TO FIND Med Name: Taking experimental drug or possible placeb for lungs, managed by [facility] with [place]    [provider]

## 2024-09-03 NOTE — Plan of Care (Signed)
  Problem: Clinical Measurements: Goal: Ability to maintain clinical measurements within normal limits will improve Outcome: Progressing Goal: Will remain free from infection Outcome: Progressing Goal: Diagnostic test results will improve Outcome: Progressing Goal: Respiratory complications will improve Outcome: Progressing Goal: Cardiovascular complication will be avoided Outcome: Progressing   Problem: Activity: Goal: Risk for activity intolerance will decrease Outcome: Progressing   Problem: Coping: Goal: Level of anxiety will decrease Outcome: Progressing   Problem: Pain Managment: Goal: General experience of comfort will improve and/or be controlled Outcome: Progressing   Problem: Safety: Goal: Ability to remain free from injury will improve Outcome: Progressing

## 2024-09-03 NOTE — Progress Notes (Signed)
 Mobility Specialist Progress Note:    09/03/24 1051  Mobility  Activity Ambulated with assistance  Level of Assistance Standby assist, set-up cues, supervision of patient - no hands on  Assistive Device Front wheel walker  Distance Ambulated (ft) 500 ft  Activity Response Tolerated well  Mobility Referral Yes  Mobility visit 1 Mobility  Mobility Specialist Start Time (ACUTE ONLY) 1051  Mobility Specialist Stop Time (ACUTE ONLY) 1106  Mobility Specialist Time Calculation (min) (ACUTE ONLY) 15 min   Received pt sitting in chair eager and agreeable to session. No c/o any symptoms. Pt moving and ambulating well requesting to ambulate more often. Returned pt to room w/ all needs met.   Vincent Meyers Mobility Specialist Please Neurosurgeon or Rehab Office at [phone]

## 2024-09-04 ENCOUNTER — Other Ambulatory Visit: Payer: Self-pay

## 2024-09-04 ENCOUNTER — Encounter (HOSPITAL_COMMUNITY): Payer: Self-pay | Admitting: Internal Medicine

## 2024-09-04 ENCOUNTER — Inpatient Hospital Stay (HOSPITAL_COMMUNITY)

## 2024-09-04 DIAGNOSIS — J849 Interstitial pulmonary disease, unspecified: Secondary | ICD-10-CM | POA: Diagnosis not present

## 2024-09-04 DIAGNOSIS — Z4682 Encounter for fitting and adjustment of non-vascular catheter: Secondary | ICD-10-CM | POA: Diagnosis not present

## 2024-09-04 DIAGNOSIS — I517 Cardiomegaly: Secondary | ICD-10-CM | POA: Diagnosis not present

## 2024-09-04 DIAGNOSIS — J9383 Other pneumothorax: Secondary | ICD-10-CM | POA: Diagnosis not present

## 2024-09-04 DIAGNOSIS — J939 Pneumothorax, unspecified: Secondary | ICD-10-CM | POA: Diagnosis not present

## 2024-09-04 LAB — BASIC METABOLIC PANEL WITH GFR
Anion gap: 9 (ref 5–15)
BUN: 10 mg/dL (ref 8–23)
CO2: 27 mmol/L (ref 22–32)
Calcium: 8.6 mg/dL — ABNORMAL LOW (ref 8.9–10.3)
Chloride: 96 mmol/L — ABNORMAL LOW (ref 98–111)
Creatinine, Ser: 0.5 mg/dL — ABNORMAL LOW (ref 0.61–1.24)
GFR, Estimated: >60 mL/min (ref >60)
Glucose, Bld: 130 mg/dL — ABNORMAL HIGH (ref 70–99)
Potassium: 4.4 mmol/L (ref 3.5–5.1)
Sodium: 132 mmol/L — ABNORMAL LOW (ref 135–145)

## 2024-09-04 LAB — CBC
HCT: 41.1 % (ref 39.0–52.0)
Hemoglobin: 13.5 g/dL (ref 13.0–17.0)
MCH: 26.9 pg (ref 26.0–34.0)
MCHC: 32.8 g/dL (ref 30.0–36.0)
MCV: 82 fL (ref 80.0–100.0)
Platelets: 190 K/uL (ref 150–400)
RBC: 5.01 MIL/uL (ref 4.22–5.81)
RDW: 13.2 % (ref 11.5–15.5)
WBC: 12.5 K/uL — ABNORMAL HIGH (ref 4.0–10.5)
nRBC: 0 % (ref 0.0–0.2)

## 2024-09-04 LAB — MAGNESIUM: Magnesium: 1.9 mg/dL (ref 1.7–2.4)

## 2024-09-04 MED ORDER — INFLUENZA VAC SPLIT HIGH-DOSE 0.5 ML IM SUSY
0.5000 mL | PREFILLED_SYRINGE | INTRAMUSCULAR | Status: AC
Start: 2024-09-05 — End: ?
  Administered 2024-09-07: 0.5 mL via INTRAMUSCULAR
  Filled 2024-09-04: qty 0.5

## 2024-09-04 NOTE — Progress Notes (Signed)
 PROGRESS NOTE    Vincent Meyers  FMW:[id number] DOB: [birth year] DOA: 08/29/2024 PCP: [name], NP    Brief Narrative:  78 year old with history of ILD, COPD, OSA, HTN, HLD, DM2 comes to the ED with shortness of breath.  Chest x-ray showed large right-sided spontaneous pneumothorax requiring chest tube placement.  Pulmonary team has been consulted.  Recurrent pneumothorax despite chest tube to suction.  CT chest showed moderate size pneumothorax but improved with suction.  Evaluated by CT surgery, no further intervention.   Assessment & Plan:  Acute respiratory distress secondary to right-sided large pneumothorax - Chest x-ray showed large right-sided spontaneous pneumothorax requiring chest tube placement.  Pulmonary following.  Recurrent pneumothorax despite chest tube to suction.  CT chest showing moderate-sized pneumothorax, recurrent.  Evaluated by CT CS, no further intervention at this time due to underlying poor lung issues.   History of ILD COPD - Follows with Dr. Bubba on home pirfenidone , bronchodilators  Essential hypertension - On amlodipine ,  HCTZ, lisinopril .  IV as needed  Hyperlipidemia - Statin  CAD/PAD - Aspirin and statin.  Currently chest pain-free  GERD - PPI  DM2 - Sliding scale and Accu-Cheks..-  Hyponatremia - From hypovolemia.  Stop HCTZ  DVT prophylaxis: heparin  injection 5,000 Units Start: 08/30/24 0600 Place and maintain sequential compression device Start: 08/29/24 1443      Code Status: Full Code Family Communication:  [employer]; wrong number Status is: Inpatient Remains inpatient appropriate because: Ongoing management for pneumothorax Continue hospital stay until cleared by pulmonary team & CTCTS  PT Follow up Recs:   Subjective: Seen at bedside.  Shortness of breath is intermittent.  Examination:  General exam: Appears calm and comfortable, elderly frail Respiratory system: Clear to auscultation. Respiratory  effort normal. Cardiovascular system: S1 & S2 heard, RRR. No JVD, murmurs, rubs, gallops or clicks. No pedal edema. Gastrointestinal system: Abdomen is nondistended, soft and nontender. No organomegaly or masses felt. Normal bowel sounds heard. Central nervous system: Alert and oriented. No focal neurological deficits. Extremities: Symmetric 5 x 5 power. Skin: No rashes, lesions or ulcers Psychiatry: Judgement and insight appear normal. Mood & affect appropriate. Chest tube in place               Diet Orders (From admission, onward)     Start     Ordered   08/29/24 1349  Diet heart healthy/carb modified Room service appropriate? Yes; Fluid consistency: Thin  Diet effective now       Question Answer Comment  Diet-HS Snack? Nothing   Room service appropriate? Yes   Fluid consistency: Thin      08/29/24 1350            Objective: Vitals:   09/04/24 0056 09/04/24 0453 09/04/24 0715 09/04/24 1105  BP: 126/70 (!) 148/67 112/65 (!) 146/74  Pulse: 70 64 68 77  Resp: 18 18 20 17   Temp: 97.8 F (36.6 C) 97.9 F (36.6 C) 98.1 F (36.7 C) 98 F (36.7 C)  TempSrc: Oral Oral Oral Oral  SpO2: 96% 97% 94% (!) 89%  Weight:        Intake/Output Summary (Last 24 hours) at 09/04/2024 1142 Last data filed at 09/04/2024 0810 Gross per 24 hour  Intake 517 ml  Output 1845 ml  Net -1328 ml   Filed Weights   09/01/24 0627  Weight: 91.3 kg    Scheduled Meds:  amLODipine   5 mg Oral Daily   aspirin EC  81 mg Oral Daily  atenolol   75 mg Oral Daily   fluticasone   2 spray Each Nare Daily   heparin   5,000 Units Subcutaneous Q8H   lisinopril   40 mg Oral Daily   loratadine  10 mg Oral Daily   pantoprazole  40 mg Oral Daily   Pirfenidone   801 mg Oral TID WC   psyllium  1 packet Oral Daily   rosuvastatin   10 mg Oral Daily   sodium chloride  flush  10 mL Intrapleural Q8H   Continuous Infusions:  Nutritional status     Body mass index is 31.51 kg/m.  Data Reviewed:    CBC: Recent Labs  Lab 08/31/24 0258 09/01/24 0338 09/02/24 0228 09/03/24 0327 09/04/24 0231  WBC 14.8* 14.5* 14.4* 14.4* 12.5*  HGB 14.6 14.3 14.2 13.7 13.5  HCT 44.0 43.0 43.0 41.2 41.1  MCV 80.6 80.5 81.3 80.8 82.0  PLT 187 189 197 182 190   Basic Metabolic Panel: Recent Labs  Lab 08/31/24 0258 09/01/24 0338 09/02/24 0228 09/03/24 0327 09/04/24 0231  NA 126* 131* 129* 130* 132*  K 3.8 4.0 4.5 4.4 4.4  CL 91* 96* 93* 96* 96*  CO2 25 25 27 25 27   GLUCOSE 141* 136* 139* 143* 130*  BUN 10 10 9 14 10   CREATININE 0.55* 0.40* 0.63 0.53* 0.50*  CALCIUM  8.5* 8.5* 8.8* 8.6* 8.6*  MG 1.7 1.8 1.9 1.9 1.9   GFR: Estimated Creatinine Clearance: 83.3 mL/min (A) (by C-G formula based on SCr of 0.5 mg/dL (L)). Liver Function Tests: Recent Labs  Lab 08/30/24 0555  AST 23  ALT 19  ALKPHOS 47  BILITOT 0.9  PROT 6.1*  ALBUMIN 3.2*   No results for input(s): LIPASE, AMYLASE in the last 168 hours. No results for input(s): AMMONIA in the last 168 hours. Coagulation Profile: No results for input(s): INR, PROTIME in the last 168 hours. Cardiac Enzymes: No results for input(s): CKTOTAL, CKMB, CKMBINDEX, TROPONINI in the last 168 hours. BNP (last 3 results) No results for input(s): PROBNP in the last 8760 hours. HbA1C: No results for input(s): HGBA1C in the last 72 hours. CBG: No results for input(s): GLUCAP in the last 168 hours. Lipid Profile: No results for input(s): CHOL, HDL, LDLCALC, TRIG, CHOLHDL, LDLDIRECT in the last 72 hours. Thyroid Function Tests: No results for input(s): TSH, T4TOTAL, FREET4, T3FREE, THYROIDAB in the last 72 hours. Anemia Panel: No results for input(s): VITAMINB12, FOLATE, FERRITIN, TIBC, IRON, RETICCTPCT in the last 72 hours. Sepsis Labs: No results for input(s): PROCALCITON, LATICACIDVEN in the last 168 hours.  Recent Results (from the past 240 hours)  Resp panel by RT-PCR (RSV,  Flu A&B, Covid) Anterior Nasal Swab     Status: None   Collection Time: 08/29/24 12:48 PM   Specimen: Anterior Nasal Swab  Result Value Ref Range Status   SARS Coronavirus 2 by RT PCR NEGATIVE NEGATIVE Final   Influenza A by PCR NEGATIVE NEGATIVE Final   Influenza B by PCR NEGATIVE NEGATIVE Final    Comment: (NOTE) The Xpert Xpress SARS-CoV-2/FLU/RSV plus assay is intended as an aid in the diagnosis of influenza from Nasopharyngeal swab specimens and should not be used as a sole basis for treatment. Nasal washings and aspirates are unacceptable for Xpert Xpress SARS-CoV-2/FLU/RSV testing.  Fact Sheet for Patients: BloggerCourse.com  Fact Sheet for Healthcare Providers: SeriousBroker.it  This test is not yet approved or cleared by the [place]  FDA and has been authorized for detection and/or diagnosis of SARS-CoV-2 by FDA under an Emergency Use Authorization (  EUA). This EUA will remain in effect (meaning this test can be used) for the duration of the COVID-19 declaration under Section 564(b)(1) of the Act, 21 U.S.C. section 360bbb-3(b)(1), unless the authorization is terminated or revoked.     Resp Syncytial Virus by PCR NEGATIVE NEGATIVE Final    Comment: (NOTE) Fact Sheet for Patients: BloggerCourse.com  Fact Sheet for Healthcare Providers: SeriousBroker.it  This test is not yet approved or cleared by the [place]  FDA and has been authorized for detection and/or diagnosis of SARS-CoV-2 by FDA under an Emergency Use Authorization (EUA). This EUA will remain in effect (meaning this test can be used) for the duration of the COVID-19 declaration under Section 564(b)(1) of the Act, 21 U.S.C. section 360bbb-3(b)(1), unless the authorization is terminated or revoked.  Performed at [facility], [address], [place], [place] [address]          Radiology  Studies: DG CHEST PORT 1 VIEW Result Date: 09/03/2024 CLINICAL DATA:  Follow up pneumothorax. EXAM: PORTABLE CHEST 1 VIEW COMPARISON:  Radiographs 09/02/2024 and 09/01/2024.  CT 09/02/2024. FINDINGS: 1338 hours. Small caliber right pleural drainage catheter is unchanged from the most recent radiographs. No definite residual right-sided pneumothorax identified. Stable underlying chronic pulmonary fibrotic changes without superimposed airspace disease or significant pleural effusion. The heart size and mediastinal contours are stable. No acute osseous findings are seen. IMPRESSION: 1. No definite residual right-sided pneumothorax identified. Stable right pleural drainage catheter. 2. Stable chronic pulmonary fibrotic changes. Electronically Signed   By: Elsie Perone M.D.   On: 09/03/2024 18:47           LOS: 6 days   Time spent= 35 mins    Burgess JAYSON Dare, MD [facility]  If 7PM-7AM, please contact night-coverage  09/04/2024, 11:42 AM

## 2024-09-04 NOTE — Progress Notes (Signed)
 Specialty Pharmacy Refill Coordination Note  Vincent Meyers is a 78 y.o. male contacted today regarding refills of specialty medication(s) Pirfenidone    Patient requested Delivery   Delivery date: 09/06/24   Verified address: [address] [place]   Medication will be filled on 09/05/24.

## 2024-09-04 NOTE — Progress Notes (Signed)
 PT Cancellation Note  Patient Details Name: Vincent Meyers MRN: [id number] DOB: March 16, 1946   Cancelled Treatment:    Reason Eval/Treat Not Completed: Patient declined, no reason specified (Pt reports that he does not want to walk right now becuase he needs to eat and then 30 minutes after eating before he moves around. He reports that he had trouble with ambulating and stairs before coming to hospital. Will follow up as able/ appropriate)  Vincent Meyers, DPT, CLT  Acute Rehabilitation Services Office: [phone] (Secure chat preferred)   Vincent Meyers 09/04/2024, 12:02 PM

## 2024-09-04 NOTE — Plan of Care (Signed)
   Problem: Education: Goal: Knowledge of General Education information will improve Description Including pain rating scale, medication(s)/side effects and non-pharmacologic comfort measures Outcome: Progressing   Problem: Clinical Measurements: Goal: Diagnostic test results will improve Outcome: Progressing Goal: Respiratory complications will improve Outcome: Progressing

## 2024-09-04 NOTE — Progress Notes (Addendum)
 Subjective: Breathing reasonably comfortably  Objective: Vital signs in last 24 hours: Temp:  [97.8 F (36.6 C)-98.4 F (36.9 C)] 97.9 F (36.6 C) (10/14 0453) Pulse Rate:  [64-70] 64 (10/14 0453) Cardiac Rhythm: Normal sinus rhythm (10/13 1900) Resp:  [16-18] 18 (10/14 0453) BP: (116-148)/(58-70) 148/67 (10/14 0453) SpO2:  [95 %-98 %] 97 % (10/14 0453)  Hemodynamic parameters for last 24 hours:    Intake/Output from previous day: 10/13 0701 - 10/14 0700 In: 624 [P.O.:594] Out: 2645 [Urine:2575; Chest Tube:70] Intake/Output this shift: No intake/output data recorded.  General appearance: alert, cooperative, and no distress Heart: regular rate and rhythm Lungs: some basilar crackles  Lab Results: Recent Labs    09/03/24 0327 09/04/24 0231  WBC 14.4* 12.5*  HGB 13.7 13.5  HCT 41.2 41.1  PLT 182 190   BMET:  Recent Labs    09/03/24 0327 09/04/24 0231  NA 130* 132*  K 4.4 4.4  CL 96* 96*  CO2 25 27  GLUCOSE 143* 130*  BUN 14 10  CREATININE 0.53* 0.50*  CALCIUM  8.6* 8.6*    PT/INR: No results for input(s): LABPROT, INR in the last 72 hours. ABG    Component Value Date/Time   HCO3 28.0 09/09/2023 1319   TCO2 29 09/09/2023 1319   O2SAT 76 09/09/2023 1319   CBG (last 3)  No results for input(s): GLUCAP in the last 72 hours.  Meds Scheduled Meds:  amLODipine   5 mg Oral Daily   aspirin EC  81 mg Oral Daily   atenolol   75 mg Oral Daily   fluticasone   2 spray Each Nare Daily   heparin   5,000 Units Subcutaneous Q8H   lisinopril   40 mg Oral Daily   loratadine  10 mg Oral Daily   pantoprazole  40 mg Oral Daily   Pirfenidone   801 mg Oral TID WC   psyllium  1 packet Oral Daily   rosuvastatin   10 mg Oral Daily   sodium chloride  flush  10 mL Intrapleural Q8H   Continuous Infusions: PRN Meds:.acetaminophen , glucagon (human recombinant), hydrALAZINE , ipratropium-albuterol , metoprolol  tartrate, ondansetron  (ZOFRAN ) IV, oxyCODONE,  senna-docusate  Xrays DG CHEST PORT 1 VIEW Result Date: 09/03/2024 CLINICAL DATA:  Follow up pneumothorax. EXAM: PORTABLE CHEST 1 VIEW COMPARISON:  Radiographs 09/02/2024 and 09/01/2024.  CT 09/02/2024. FINDINGS: 1338 hours. Small caliber right pleural drainage catheter is unchanged from the most recent radiographs. No definite residual right-sided pneumothorax identified. Stable underlying chronic pulmonary fibrotic changes without superimposed airspace disease or significant pleural effusion. The heart size and mediastinal contours are stable. No acute osseous findings are seen. IMPRESSION: 1. No definite residual right-sided pneumothorax identified. Stable right pleural drainage catheter. 2. Stable chronic pulmonary fibrotic changes. Electronically Signed   By: Elsie Perone M.D.   On: 09/03/2024 18:47   CT CHEST WO CONTRAST Result Date: 09/02/2024 EXAM: CT CHEST WITHOUT CONTRAST 09/02/2024 09:41:20 AM TECHNIQUE: CT of the chest was performed without the administration of intravenous contrast. Multiplanar reformatted images are provided for review. Automated exposure control, iterative reconstruction, and/or weight based adjustment of the mA/kV was utilized to reduce the radiation dose to as low as reasonably achievable. COMPARISON: CT angio chest 08/22/2024. CLINICAL HISTORY: Persistent pneumothorax. FINDINGS: MEDIASTINUM: Normal heart size. No pericardial effusion. Coronary artery calcifications. Aortic atherosclerosis. Central airways are patent. A few small foci of pneumomediastinum noted along the right inferior heart border (axial image 98/2). LYMPH NODES: Prominent mediastinal lymph nodes, nonpathologically enlarged by size criteria, measuring up to 1.4 cm. Unchanged from previous  exam. This is a nonspecific finding in the setting of chronic interstitial lung disease and likely reactive. Hilar lymph nodes are suboptimally visualized due to lack of IV contrast material. No axillary  lymphadenopathy. LUNGS AND PLEURA: Right chest wall pigtail thoracostomy tube is identified, which enters from a ventral chest wall approach. The pigtail terminates over the anterior aspect of the inferior right upper lobe. Persistent right pneumothorax is identified which appears mild-to-moderate in volume. Signs of chronic interstitial lung disease are again noted with diffuse peripheral predominant interstitial reticulation and basilar subpleural honeycombing. Traction bronchiectasis is noted. Unchanged large thin-walled cyst in the superior segment of the right lower lobe. No focal consolidation or pulmonary edema. 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 No acute abnormality of the bones. UPPER ABDOMEN: Limited images of the upper abdomen demonstrate calcifications within the pancreas, compatible with chronic pancreatitis. No acute abnormality within the imaged portions of the upper abdomen. IMPRESSION: 1. Persistent right pneumothorax, mild-to-moderate in volume, with right chest wall pigtail thoracostomy tube in place. 2. A few small foci of pneumomediastinum along the right inferior heart border. 3. Signs of chronic interstitial lung disease compatible with usual interstitial pneumonia as characterized by previous high-resolution CT of the chest. 4. Aortic atherosclerotic calcification and coronary artery calcification. Electronically signed by: Waddell Calk MD 09/02/2024 10:04 AM EDT RP Workstation: [id number]   DG CHEST PORT 1 VIEW Result Date: 09/02/2024 CLINICAL DATA:  [id number] Pneumothorax [id number]. EXAM: PORTABLE CHEST 1 VIEW COMPARISON:  09/01/2024. FINDINGS: Continued interval decrease in the right apicolateral pneumothorax when compared to the prior radiograph. There are atelectatic changes at the lung bases. Redemonstration of right-sided pleural drainage catheter which appears in different position.  Bilateral lung fields are otherwise clear. No acute consolidation or lung collapse. No left pneumothorax. Bilateral costophrenic angles are clear. Stable cardio-mediastinal silhouette. No acute osseous abnormalities. The soft tissues are within normal limits. IMPRESSION: Continued interval decrease in the right apicolateral pneumothorax when compared to the prior radiograph. Electronically Signed   By: Ree Molt M.D.   On: 09/02/2024 09:26    Assessment/Plan:   1 afeb, s BP 120's-140's, NSR 2 O2 sats good on 3 liters 3 CT 70 ml/24h , no air leak currently on suction 4 will recheck CXR in am- cont CT management per CCM as he is not a surgical candidate    LOS: 6 days    Lemond FORBES Cera PA-C Pager [phone] 09/04/2024

## 2024-09-04 NOTE — Progress Notes (Addendum)
 NAME:  Vincent Meyers, MRN:  [id number], DOB:  Feb 03, 1946, LOS: 6 ADMISSION DATE:  08/29/2024, CONSULTATION DATE: 10/8  REFERRING MD: EMERSON Fusi CHIEF COMPLAINT:  Shortness of breath   Initial History of Present Illness:  Patient is a 78 year old male with significant past medical history of ILD-2 L of oxygen baseline for the last couple years, (follows up with MD Geronimo at [facility] pulmonary outpatient), OSA, COPD, hypertension, HLD, CAD, diabetes, anxiety, and acid reflux who presented to the emergency department at [facility] with worsening shortness of breath.  Initial chest x-ray was completed showing large right pneumothorax and chronic interstitial lung disease changes.  Right chest tube was placed by ED staff.  TRH consulted for admission and PCCM consulted for assistance in chest tube management.   Upon initial assessment of patient within ED, patient hemodynamically stable with some hypertension notable.  Patient on 4 L nasal cannula with O2 sats 94 to 95%, respiratory rate low 20s. Patient reports that had similar pain/discomfort over a week ago but went away. Patient noticed chest pain and eventually SOB development after heavy lifting yesterday afternoon. SOB progressively got worse with exertion. Patient denies dizziness, vertigo, syncope, hemoptysis, fevers, chills, sick contacts. Patient endorses slight increase in cough after incident began to occur.  Upon assessing the [place] drain system of the chest tube-noticeable airleak was evaluated from and upon assessing chest tube site, chest tube was completely out chest.  Immediately called for stat chest x-ray to come to be completed, and placement of split gauze dressing.  Repeat chest x-ray showing significant pneumothorax on right side.  Discussed with patient and wife about emergent placement of second chest tube.  Chest tube placed more anteriorly. Right lung re-expansion noted.   Overnight he did not tolerate waterseal and had  pneumothorax reexpansion. He has a persistent airleak    Pertinent  Medical History   Past Medical History:  Diagnosis Date   Acid reflux    Angina pectoris    Anxiety    Arthritis    Coronary artery disease    Enlarged prostate    per [facility] new patient packet   Erectile dysfunction    High blood sugar    per [facility] new patient packet   Hyperlipidemia    Hypertension    IBS (irritable bowel syndrome)    ILD (interstitial lung disease) (HCC)    OSA (obstructive sleep apnea)    Pulmonary fibrosis (HCC)    per [facility] new patient packet     Significant Hospital Events: Including procedures, antibiotic start and stop dates in addition to other pertinent events   10/8 Pulm Consult for Right Pneumo, Hx of ILD . Chest tube noted to be out and replaced  10/9 Persistent airleak, small apical pneumo on CXR   Interim History / Subjective:  Patient stable, on 2 LNC. Pneumothorax stable No air leak noted today    Objective    Blood pressure (!) 146/74, pulse 77, temperature 98 F (36.7 C), temperature source Oral, resp. rate 17, weight 91.3 kg, SpO2 (!) 89%.        Intake/Output Summary (Last 24 hours) at 09/04/2024 1452 Last data filed at 09/04/2024 1203 Gross per 24 hour  Intake 390 ml  Output 1795 ml  Net -1405 ml   Filed Weights   09/01/24 0627  Weight: 91.3 kg    Examination: General: Elderly male, obese and not in acute distress HEENT: Normocephalic, atraumatic  CV: Regular rate and rhythm, normal S1, normal S2  Pulm: Bilateral breath sounds clear Abs: Soft, nontender, nondistended Extremities: No edema Skin: No rash Neuro: A&O x 3  Resolved problem list   Assessment and Plan  Spontaneous pneumothorax secondary to ILD -MD Ramaswamy outpatient for ILD/pulmonary fibrosis -Per patient, has been on 2 L of nasal cannula chronically for approximately 2 years   No air leak today. Chest xray stable. Removed suction today. Will get Chest xray tomorrow Pt requiring  baseline oxygen    History of COPD/OSA on CPAP Chronically on 2 L nasal cannula   Other problems managed by primary: Hypertension Hyperlipidemia Anxiety Diabetes type 2 CAD Acid reflux   Labs   CBC: Recent Labs  Lab 08/31/24 0258 09/01/24 0338 09/02/24 0228 09/03/24 0327 09/04/24 0231  WBC 14.8* 14.5* 14.4* 14.4* 12.5*  HGB 14.6 14.3 14.2 13.7 13.5  HCT 44.0 43.0 43.0 41.2 41.1  MCV 80.6 80.5 81.3 80.8 82.0  PLT 187 189 197 182 190    Basic Metabolic Panel: Recent Labs  Lab 08/31/24 0258 09/01/24 0338 09/02/24 0228 09/03/24 0327 09/04/24 0231  NA 126* 131* 129* 130* 132*  K 3.8 4.0 4.5 4.4 4.4  CL 91* 96* 93* 96* 96*  CO2 25 25 27 25 27   GLUCOSE 141* 136* 139* 143* 130*  BUN 10 10 9 14 10   CREATININE 0.55* 0.40* 0.63 0.53* 0.50*  CALCIUM  8.5* 8.5* 8.8* 8.6* 8.6*  MG 1.7 1.8 1.9 1.9 1.9   GFR: Estimated Creatinine Clearance: 83.3 mL/min (A) (by C-G formula based on SCr of 0.5 mg/dL (L)). Recent Labs  Lab 09/01/24 0338 09/02/24 0228 09/03/24 0327 09/04/24 0231  WBC 14.5* 14.4* 14.4* 12.5*    Liver Function Tests: Recent Labs  Lab 08/30/24 0555  AST 23  ALT 19  ALKPHOS 47  BILITOT 0.9  PROT 6.1*  ALBUMIN 3.2*   No results for input(s): LIPASE, AMYLASE in the last 168 hours. No results for input(s): AMMONIA in the last 168 hours.  ABG    Component Value Date/Time   HCO3 28.0 09/09/2023 1319   TCO2 29 09/09/2023 1319   O2SAT 76 09/09/2023 1319     Coagulation Profile: No results for input(s): INR, PROTIME in the last 168 hours.  Cardiac Enzymes: No results for input(s): CKTOTAL, CKMB, CKMBINDEX, TROPONINI in the last 168 hours.  HbA1C: Hgb A1c MFr Bld  Date/Time Value Ref Range Status  06/11/2024 02:59 PM 6.6 (H) <5.7 % Final    Comment:    For someone without known diabetes, a hemoglobin A1c value of 6.5% or greater indicates that they may have  diabetes and this should be confirmed with a follow-up   test. . For someone with known diabetes, a value <7% indicates  that their diabetes is well controlled and a value  greater than or equal to 7% indicates suboptimal  control. A1c targets should be individualized based on  duration of diabetes, age, comorbid conditions, and  other considerations. . Currently, no consensus exists regarding use of hemoglobin A1c for diagnosis of diabetes for children. SABRA   03/02/2024 03:19 PM 7.1 (H) <5.7 % of total Hgb Final    Comment:    For someone without known diabetes, a hemoglobin A1c value of 6.5% or greater indicates that they may have  diabetes and this should be confirmed with a follow-up  test. . For someone with known diabetes, a value <7% indicates  that their diabetes is well controlled and a value  greater than or equal to 7% indicates suboptimal  control.  A1c targets should be individualized based on  duration of diabetes, age, comorbid conditions, and  other considerations. . Currently, no consensus exists regarding use of hemoglobin A1c for diagnosis of diabetes for children. .     CBG: No results for input(s): GLUCAP in the last 168 hours.  Review of Systems:      Past Medical History:  He,  has a past medical history of Acid reflux, Angina pectoris, Anxiety, Arthritis, Coronary artery disease, Enlarged prostate, Erectile dysfunction, High blood sugar, Hyperlipidemia, Hypertension, IBS (irritable bowel syndrome), ILD (interstitial lung disease) (HCC), OSA (obstructive sleep apnea), and Pulmonary fibrosis (HCC).   Surgical History:   Past Surgical History:  Procedure Laterality Date   APPENDECTOMY     COLON SURGERY     perforated bowel and hernia repair   COLONOSCOPY  2024   per [facility] new patient packet   COLONOSCOPY N/A 05/17/2024   Procedure: COLONOSCOPY;  Surgeon: Legrand Victory LITTIE DOUGLAS, MD;  Location: [facility] END[facility];  Service: Gastroenterology;  Laterality: N/A;   HEMOSTASIS CLIP PLACEMENT  05/17/2024   Procedure:  CONTROL OF HEMORRHAGE, GI TRACT, ENDOSCOPIC, BY CLIPPING OR OVERSEWING;  Surgeon: Legrand Victory LITTIE DOUGLAS, MD;  Location: [facility] ENDOS[facility];  Service: Gastroenterology;;   ILIAC ARTERY ANEURYSM REPAIR Left 2006   Dr. Oris (redo surgery)   POLYPECTOMY  05/17/2024   Procedure: POLYPECTOMY, INTESTINE;  Surgeon: Legrand Victory LITTIE DOUGLAS, MD;  Location: [facility]OS[facility];  Service: Gastroenterology;;   REPLACEMENT TOTAL KNEE Left 2008   Dr.Yeats, per [facility] new patient packet   REPLACEMENT TOTAL KNEE Right 2018   Dr.J, per [facility] new patient packet   REPLACEMENT TOTAL KNEE BILATERAL     RIGHT HEART CATH N/A 09/09/2023   Procedure: RIGHT HEART CATH;  Surgeon: Rolan Ezra RAMAN, MD;  Location: [facility] INVASIVE CV LAB;  Service: Cardiovascular;  Laterality: N/A;   TONSILLECTOMY     age 89     Social History:   reports that he quit smoking about 36 years ago. His smoking use included cigarettes. He started smoking about 59 years ago. He has a 46 pack-year smoking history. He has been exposed to tobacco smoke. He has never used smokeless tobacco. He reports that he does not currently use alcohol after a past usage of about 4.0 - 5.0 standard drinks of alcohol per week. He reports that he does not use drugs.   Family History:  His family history includes Alzheimer's disease in his father and sister; Heart attack in his father; Heart murmur in his daughter; Hypertension in his daughter; Obesity in his daughter; Other in his sister; Parkinson's disease in his mother; Stroke in his father. There is no history of Colon cancer, Esophageal cancer, Rectal cancer, or Stomach cancer.   Allergies No Known Allergies   Home Medications  Prior to Admission medications   Medication Sig Start Date End Date Taking? Authorizing Provider  amLODipine  (NORVASC ) 5 MG tablet TAKE 1 TABLET (5 MG TOTAL) BY MOUTH DAILY. 05/28/24 05/23/25  [name], NP  aspirin EC 81 MG tablet Take 81 mg by mouth daily.    [provider]  atenolol   (TENORMIN ) 50 MG tablet Take 1.5 tablets (75 mg total) by mouth daily. 06/07/24   Fargo, Amy E, NP  Coenzyme Q10 (COQ10 PO) Take 500 mg by mouth daily.    [provider]  FIBER PO Take 4 capsules by mouth 2 (two) times daily.    [provider]  Flaxseed, Linseed, (FLAXSEED OIL PO) Take 1  capsule by mouth daily.    [provider]  fluticasone  (FLONASE ) 50 MCG/ACT nasal spray Place 2 sprays into both nostrils daily. 12/27/22   [provider]  hydrochlorothiazide  (HYDRODIURIL ) 25 MG tablet TAKE 1 TABLET BY MOUTH EVERY DAY 04/04/24   Eubanks, Jessica K, NP  lisinopril  (ZESTRIL ) 40 MG tablet Take 1 tablet (40 mg total) by mouth daily. 06/07/24   Fargo, Amy E, NP  loratadine (CLARITIN) 10 MG tablet Take 10 mg by mouth daily.    [provider]  MAGNESIUM-POTASSIUM PO Take 1 tablet by mouth daily. With zinc Patient not taking: Reported on 06/11/2024    [provider]  naproxen (NAPROSYN) 500 MG tablet Take 1 tablet (500 mg total) by mouth 2 (two) times daily. 08/22/24   Bauer, Collin S, PA-C  omeprazole  (PRILOSEC) 20 MG capsule TAKE 1 CAPSULE BY MOUTH EVERY DAY 08/22/24   Eubanks, Jessica K, NP  Pirfenidone  801 MG TABS Take 1 tablet (801 mg total) by mouth 3 (three) times daily with meals. 06/08/24   Geronimo Amel, MD  rosuvastatin  (CRESTOR ) 10 MG tablet TAKE 1 TABLET BY MOUTH EVERY DAY 04/27/24   Croitoru, Mihai, MD  tadalafil  (CIALIS ) 20 MG tablet Take 1 tablet (20 mg total) by mouth daily as needed. 08/22/23   Stoneking, Adine PARAS., MD  TURMERIC-GINGER PO Take 2,600 mg by mouth daily.    [provider]  UNABLE TO FIND Med Name: Taking experimental drug or possible placeb for lungs, managed by [facility] [facility] with [place]    [provider]

## 2024-09-04 NOTE — Plan of Care (Signed)
   Problem: Education: Goal: Knowledge of General Education information will improve Description Including pain rating scale, medication(s)/side effects and non-pharmacologic comfort measures Outcome: Progressing   Problem: Clinical Measurements: Goal: Ability to maintain clinical measurements within normal limits will improve Outcome: Progressing   Problem: Activity: Goal: Risk for activity intolerance will decrease Outcome: Progressing

## 2024-09-04 NOTE — Progress Notes (Signed)
 Occupational Therapy Treatment Patient Details Name: Vincent Meyers MRN: [id number] DOB: Aug 18, 1946 Today's Date: 09/04/2024   History of present illness 78 y.o M adm 08/29/24 for increased SOB with Rt PTX. 10/8 chest tube, 10/9 2nd chest tube. PMH: ILD, COPD baseline 2L, OSA, HTN, HLD, DM, CAD   OT comments  Pt progressing towards OT goals. Able to bend and perform LB ADL, focused on hallway ambulation while educating on energy conservation with focus on ways to enable him to work in his garage (he works on/builds cars and motorcycles). Pt Received on 2.5L, at 94%, increased to 3L for mobility lowest O2 at 92%. Left resting on 2L at 94%. OT will continue to follow acutely.        If plan is discharge home, recommend the following:  Assistance with cooking/housework   Equipment Recommendations  None recommended by OT    Recommendations for Other Services      Precautions / Restrictions Precautions Precautions: Fall Recall of Precautions/Restrictions: Intact Precaution/Restrictions Comments: Rt chest tube Restrictions Weight Bearing Restrictions Per Provider Order: No       Mobility Bed Mobility Overal bed mobility: Modified Independent             General bed mobility comments: Increased time and effort    Transfers Overall transfer level: Modified independent Equipment used: Rolling walker (2 wheels)               General transfer comment: good hand placement, ambulated ~351ft with RW     Balance Overall balance assessment: Mild deficits observed, not formally tested                                         ADL either performed or assessed with clinical judgement   ADL Overall ADL's : Needs assistance/impaired Eating/Feeding: Set up;Sitting   Grooming: Supervision/safety;Standing           Upper Body Dressing : Supervision/safety;Standing   Lower Body Dressing: Supervision/safety;Sit to/from stand Lower Body Dressing Details  (indicate cue type and reason): pt unable to figure 4 RLE for socks, but can bend over to don socks Toilet Transfer: Supervision/safety;Rolling walker (2 wheels)           Functional mobility during ADLs: Supervision/safety General ADL Comments: Close to baseline, mild to mod DOE    Extremity/Trunk Assessment Upper Extremity Assessment Upper Extremity Assessment: Overall WFL for tasks assessed   Lower Extremity Assessment Lower Extremity Assessment: Overall WFL for tasks assessed        Vision   Vision Assessment?: No apparent visual deficits   Perception     Praxis     Communication Communication Communication: No apparent difficulties   Cognition Arousal: Alert Behavior During Therapy: WFL for tasks assessed/performed Cognition: No apparent impairments             OT - Cognition Comments: Pt mildly impulsive but good awareness of self-monitoring of O2 and safety with lines                 Following commands: Intact        Cueing   Cueing Techniques: Verbal cues  Exercises      Shoulder Instructions       General Comments Received on 2.5L, pt staying at 94%, increased to 3L for mobility lowest O2 at 92%. Left resting on 2L staying at 94%    Pertinent Vitals/  Pain       Pain Assessment Pain Assessment: No/denies pain  Home Living                                          Prior Functioning/Environment              Frequency  Min 2X/week        Progress Toward Goals  OT Goals(current goals can now be found in the care plan section)  Progress towards OT goals: Progressing toward goals  Acute Rehab OT Goals Patient Stated Goal: get better and go home OT Goal Formulation: With patient Time For Goal Achievement: 09/14/24 Potential to Achieve Goals: Good ADL Goals Pt Will Perform Grooming: with modified independence;standing Additional ADL Goal #1: Pt will verbalize at least 3 energy conservation strategies for  ADLs Additional ADL Goal #2: Pt will complete >10 minutes OOB ADLs at mod I to increase activity tolerance  Plan      Co-evaluation                 AM-PAC OT 6 Clicks Daily Activity     Outcome Measure   Help from another person eating meals?: None Help from another person taking care of personal grooming?: A Little Help from another person toileting, which includes using toliet, bedpan, or urinal?: A Little Help from another person bathing (including washing, rinsing, drying)?: A Little Help from another person to put on and taking off regular upper body clothing?: A Little Help from another person to put on and taking off regular lower body clothing?: A Little 6 Click Score: 19    End of Session Equipment Utilized During Treatment: Rolling walker (2 wheels);Oxygen (3L)  OT Visit Diagnosis: Unsteadiness on feet (R26.81);Other abnormalities of gait and mobility (R26.89)   Activity Tolerance Patient tolerated treatment well   Patient Left in bed;with call bell/phone within reach;with family/visitor present   Nurse Communication Mobility status        Time: 0935-1005 OT Time Calculation (min): 30 min  Charges: OT General Charges $OT Visit: 1 Visit OT Treatments $Self Care/Home Management : 8-22 mins $Therapeutic Activity: 8-22 mins  Leita DEL OTR/L Acute Rehabilitation Services Office: [phone]  Leita PARAS [facility] 09/04/2024, 1:12 PM

## 2024-09-05 ENCOUNTER — Inpatient Hospital Stay (HOSPITAL_COMMUNITY)

## 2024-09-05 ENCOUNTER — Other Ambulatory Visit: Payer: Self-pay

## 2024-09-05 DIAGNOSIS — I2510 Atherosclerotic heart disease of native coronary artery without angina pectoris: Secondary | ICD-10-CM

## 2024-09-05 DIAGNOSIS — J849 Interstitial pulmonary disease, unspecified: Secondary | ICD-10-CM

## 2024-09-05 DIAGNOSIS — J449 Chronic obstructive pulmonary disease, unspecified: Secondary | ICD-10-CM | POA: Diagnosis not present

## 2024-09-05 DIAGNOSIS — I10 Essential (primary) hypertension: Secondary | ICD-10-CM

## 2024-09-05 DIAGNOSIS — J939 Pneumothorax, unspecified: Secondary | ICD-10-CM | POA: Diagnosis not present

## 2024-09-05 DIAGNOSIS — E66811 Obesity, class 1: Secondary | ICD-10-CM

## 2024-09-05 LAB — BASIC METABOLIC PANEL WITH GFR
Anion gap: 9 (ref 5–15)
BUN: 10 mg/dL (ref 8–23)
CO2: 27 mmol/L (ref 22–32)
Calcium: 8.7 mg/dL — ABNORMAL LOW (ref 8.9–10.3)
Chloride: 96 mmol/L — ABNORMAL LOW (ref 98–111)
Creatinine, Ser: 0.53 mg/dL — ABNORMAL LOW (ref 0.61–1.24)
GFR, Estimated: >60 mL/min (ref >60)
Glucose, Bld: 128 mg/dL — ABNORMAL HIGH (ref 70–99)
Potassium: 4.6 mmol/L (ref 3.5–5.1)
Sodium: 132 mmol/L — ABNORMAL LOW (ref 135–145)

## 2024-09-05 LAB — CBC
HCT: 41.6 % (ref 39.0–52.0)
Hemoglobin: 13.7 g/dL (ref 13.0–17.0)
MCH: 27 pg (ref 26.0–34.0)
MCHC: 32.9 g/dL (ref 30.0–36.0)
MCV: 81.9 fL (ref 80.0–100.0)
Platelets: 203 K/uL (ref 150–400)
RBC: 5.08 MIL/uL (ref 4.22–5.81)
RDW: 13.1 % (ref 11.5–15.5)
WBC: 12.7 K/uL — ABNORMAL HIGH (ref 4.0–10.5)
nRBC: 0 % (ref 0.0–0.2)

## 2024-09-05 LAB — MAGNESIUM: Magnesium: 2 mg/dL (ref 1.7–2.4)

## 2024-09-05 NOTE — Assessment & Plan Note (Addendum)
 Continue blood pressure control with lisinopril , hydrochlorothiazide , amlodipine  and atenolol .

## 2024-09-05 NOTE — Assessment & Plan Note (Addendum)
 Patient was placed on supplemental 0-2 per  and chest tube to the right chest.  10/12 follow up CT chest with persistent right pneumothorax, mild to moderate in volume with right chest with right chest wall pigtail thoracostomy tube in place.  Small focus of pneumomediastinum along the right inferior heart border.  Positive interstitial reticulation and basilar subpleural honeycombing. Traction bronchiectasis.  Right upper lobe with thin walled cyst in the superior segment (unchanged).   CT surgery consulted, recommendation to avoid surgical intervention if possible due to severe pulmonary fibrosis.  Fortunately his right lung continue to expand with right chest tube.   Follow up radiograph with resolution of pneumothorax.  10/16 chest tube removed with good toleration.  At the time of discharge his oxygenation remains stable,

## 2024-09-05 NOTE — Assessment & Plan Note (Signed)
 Continue with statin therapy.  ?

## 2024-09-05 NOTE — Progress Notes (Signed)
 NAME:  Vincent Meyers, MRN:  [id number], DOB:  [birth year], LOS: 7 ADMISSION DATE:  08/29/2024, CONSULTATION DATE: 10/8  REFERRING MD: EMERSON Fusi CHIEF COMPLAINT:  Shortness of breath   Initial History of Present Illness:  Patient is a 78 year old male with significant past medical history of ILD-2 L of oxygen baseline for the last couple years, (follows up with MD Geronimo at [facility] pulmonary outpatient), OSA, COPD, hypertension, HLD, CAD, diabetes, anxiety, and acid reflux who presented to the emergency department at [facility] with worsening shortness of breath.  Initial chest x-ray was completed showing large right pneumothorax and chronic interstitial lung disease changes.  Right chest tube was placed by ED staff.  TRH consulted for admission and PCCM consulted for assistance in chest tube management.   Upon initial assessment of patient within ED, patient hemodynamically stable with some hypertension notable.  Patient on 4 L nasal cannula with O2 sats 94 to 95%, respiratory rate low 20s. Patient reports that had similar pain/discomfort over a week ago but went away. Patient noticed chest pain and eventually SOB development after heavy lifting yesterday afternoon. SOB progressively got worse with exertion. Patient denies dizziness, vertigo, syncope, hemoptysis, fevers, chills, sick contacts. Patient endorses slight increase in cough after incident began to occur.  Upon assessing the [place] drain system of the chest tube-noticeable airleak was evaluated from and upon assessing chest tube site, chest tube was completely out chest.  Immediately called for stat chest x-ray to come to be completed, and placement of split gauze dressing.  Repeat chest x-ray showing significant pneumothorax on right side.  Discussed with patient and wife about emergent placement of second chest tube.  Chest tube placed more anteriorly. Right lung re-expansion noted.   Overnight he did not tolerate waterseal and had  pneumothorax reexpansion. He has a persistent airleak    Pertinent  Medical History   Past Medical History:  Diagnosis Date   Acid reflux    Angina pectoris    Anxiety    Arthritis    Coronary artery disease    Enlarged prostate    per [facility] new patient packet   Erectile dysfunction    High blood sugar    per [facility] new patient packet   Hyperlipidemia    Hypertension    IBS (irritable bowel syndrome)    ILD (interstitial lung disease) (HCC)    OSA (obstructive sleep apnea)    Pulmonary fibrosis (HCC)    per [facility] new patient packet     Significant Hospital Events: Including procedures, antibiotic start and stop dates in addition to other pertinent events   10/8 Pulm Consult for Right Pneumo, Hx of ILD . Chest tube noted to be out and replaced  10/9 Persistent airleak, small apical pneumo on CXR   Interim History / Subjective:  Patient stable, on 2 LNC. Pneumothorax stable No air leak noted today    Objective    Blood pressure 116/60, pulse 74, temperature 98.2 F (36.8 C), temperature source Oral, resp. rate 17, height 5' 7 (1.702 m), weight 91.3 kg, SpO2 99%.        Intake/Output Summary (Last 24 hours) at 09/05/2024 1826 Last data filed at 09/05/2024 1220 Gross per 24 hour  Intake 250 ml  Output 1860 ml  Net -1610 ml   Filed Weights   09/01/24 0627  Weight: 91.3 kg    Examination: General: Elderly male, obese and not in acute distress HEENT: Normocephalic, atraumatic  CV: Regular rate and rhythm, normal  S1, normal S2 Pulm: Bilateral breath sounds clear Abs: Soft, nontender, nondistended Extremities: No edema Skin: No rash Neuro: A&O x 3  Resolved problem list   Assessment and Plan  Spontaneous pneumothorax secondary to ILD -MD Ramaswamy outpatient for ILD/pulmonary fibrosis -Per patient, has been on 2 L of nasal cannula chronically for approximately 2 years   No air leak today. Chest xray stable. Clamped chest tube today. If pt develops chest  pain or develops sudden shortness of breath, will need chest xray and chest tube should be unclamped and put on suction Will get Chest xray tomorrow Pt requiring baseline oxygen    History of COPD/OSA on CPAP Chronically on 2 L nasal cannula   Other problems managed by primary: Hypertension Hyperlipidemia Anxiety Diabetes type 2 CAD Acid reflux   Labs   CBC: Recent Labs  Lab 09/01/24 0338 09/02/24 0228 09/03/24 0327 09/04/24 0231 09/05/24 0236  WBC 14.5* 14.4* 14.4* 12.5* 12.7*  HGB 14.3 14.2 13.7 13.5 13.7  HCT 43.0 43.0 41.2 41.1 41.6  MCV 80.5 81.3 80.8 82.0 81.9  PLT 189 197 182 190 203    Basic Metabolic Panel: Recent Labs  Lab 09/01/24 0338 09/02/24 0228 09/03/24 0327 09/04/24 0231 09/05/24 0236  NA 131* 129* 130* 132* 132*  K 4.0 4.5 4.4 4.4 4.6  CL 96* 93* 96* 96* 96*  CO2 25 27 25 27 27   GLUCOSE 136* 139* 143* 130* 128*  BUN 10 9 14 10 10   CREATININE 0.40* 0.63 0.53* 0.50* 0.53*  CALCIUM  8.5* 8.8* 8.6* 8.6* 8.7*  MG 1.8 1.9 1.9 1.9 2.0   GFR: Estimated Creatinine Clearance: 83.3 mL/min (A) (by C-G formula based on SCr of 0.53 mg/dL (L)). Recent Labs  Lab 09/02/24 0228 09/03/24 0327 09/04/24 0231 09/05/24 0236  WBC 14.4* 14.4* 12.5* 12.7*    Liver Function Tests: Recent Labs  Lab 08/30/24 0555  AST 23  ALT 19  ALKPHOS 47  BILITOT 0.9  PROT 6.1*  ALBUMIN 3.2*   No results for input(s): LIPASE, AMYLASE in the last 168 hours. No results for input(s): AMMONIA in the last 168 hours.  ABG    Component Value Date/Time   HCO3 28.0 09/09/2023 1319   TCO2 29 09/09/2023 1319   O2SAT 76 09/09/2023 1319     Coagulation Profile: No results for input(s): INR, PROTIME in the last 168 hours.  Cardiac Enzymes: No results for input(s): CKTOTAL, CKMB, CKMBINDEX, TROPONINI in the last 168 hours.  HbA1C: Hgb A1c MFr Bld  Date/Time Value Ref Range Status  06/11/2024 02:59 PM 6.6 (H) <5.7 % Final    Comment:    For  someone without known diabetes, a hemoglobin A1c value of 6.5% or greater indicates that they may have  diabetes and this should be confirmed with a follow-up  test. . For someone with known diabetes, a value <7% indicates  that their diabetes is well controlled and a value  greater than or equal to 7% indicates suboptimal  control. A1c targets should be individualized based on  duration of diabetes, age, comorbid conditions, and  other considerations. . Currently, no consensus exists regarding use of hemoglobin A1c for diagnosis of diabetes for children. SABRA   03/02/2024 03:19 PM 7.1 (H) <5.7 % of total Hgb Final    Comment:    For someone without known diabetes, a hemoglobin A1c value of 6.5% or greater indicates that they may have  diabetes and this should be confirmed with a follow-up  test. . For  someone with known diabetes, a value <7% indicates  that their diabetes is well controlled and a value  greater than or equal to 7% indicates suboptimal  control. A1c targets should be individualized based on  duration of diabetes, age, comorbid conditions, and  other considerations. . Currently, no consensus exists regarding use of hemoglobin A1c for diagnosis of diabetes for children. .     CBG: No results for input(s): GLUCAP in the last 168 hours.  Review of Systems:      Past Medical History:  He,  has a past medical history of Acid reflux, Angina pectoris, Anxiety, Arthritis, Coronary artery disease, Enlarged prostate, Erectile dysfunction, High blood sugar, Hyperlipidemia, Hypertension, IBS (irritable bowel syndrome), ILD (interstitial lung disease) (HCC), OSA (obstructive sleep apnea), and Pulmonary fibrosis (HCC).   Surgical History:   Past Surgical History:  Procedure Laterality Date   APPENDECTOMY     COLON SURGERY     perforated bowel and hernia repair   COLONOSCOPY  2024   per [facility] new patient packet   COLONOSCOPY N/A 05/17/2024   Procedure: COLONOSCOPY;   Surgeon: Legrand Victory LITTIE DOUGLAS, MD;  Location: [facility];  Service: Gastroenterology;  Laterality: N/A;   HEMOSTASIS CLIP PLACEMENT  05/17/2024   Procedure: CONTROL OF HEMORRHAGE, GI TRACT, ENDOSCOPIC, BY CLIPPING OR OVERSEWING;  Surgeon: Legrand Victory LITTIE DOUGLAS, MD;  Location: [facility];  Service: Gastroenterology;;   ILIAC ARTERY ANEURYSM REPAIR Left 2006   Dr. Oris (redo surgery)   POLYPECTOMY  05/17/2024   Procedure: POLYPECTOMY, INTESTINE;  Surgeon: Legrand Victory LITTIE DOUGLAS, MD;  Location: [facility];  Service: Gastroenterology;;   REPLACEMENT TOTAL KNEE Left 2008   Dr.Yeats, per [facility] new patient packet   REPLACEMENT TOTAL KNEE Right 2018   Dr.J, per [facility] new patient packet   REPLACEMENT TOTAL KNEE BILATERAL     RIGHT HEART CATH N/A 09/09/2023   Procedure: RIGHT HEART CATH;  Surgeon: Rolan Ezra RAMAN, MD;  Location: [facility] INVASIVE CV LAB;  Service: Cardiovascular;  Laterality: N/A;   TONSILLECTOMY     age 60     Social History:   reports that he quit smoking about 36 years ago. His smoking use included cigarettes. He started smoking about 59 years ago. He has a 46 pack-year smoking history. He has been exposed to tobacco smoke. He has never used smokeless tobacco. He reports that he does not currently use alcohol after a past usage of about 4.0 - 5.0 standard drinks of alcohol per week. He reports that he does not use drugs.   Family History:  His family history includes Alzheimer's disease in his father and sister; Heart attack in his father; Heart murmur in his daughter; Hypertension in his daughter; Obesity in his daughter; Other in his sister; Parkinson's disease in his mother; Stroke in his father. There is no history of Colon cancer, Esophageal cancer, Rectal cancer, or Stomach cancer.   Allergies No Known Allergies   Home Medications  Prior to Admission medications   Medication Sig Start Date End Date Taking? Authorizing Provider  amLODipine  (NORVASC ) 5 MG tablet TAKE 1 TABLET (5 MG  TOTAL) BY MOUTH DAILY. 05/28/24 05/23/25  [name], NP  aspirin EC 81 MG tablet Take 81 mg by mouth daily.    [provider]  atenolol  (TENORMIN ) 50 MG tablet Take 1.5 tablets (75 mg total) by mouth daily. 06/07/24   Fargo, Amy E, NP  Coenzyme Q10 (COQ10 PO) Take 500 mg by mouth daily.  [provider]  FIBER PO Take 4 capsules by mouth 2 (two) times daily.    [provider]  Flaxseed, Linseed, (FLAXSEED OIL PO) Take 1 capsule by mouth daily.    [provider]  fluticasone  (FLONASE ) 50 MCG/ACT nasal spray Place 2 sprays into both nostrils daily. 12/27/22   [provider]  hydrochlorothiazide  (HYDRODIURIL ) 25 MG tablet TAKE 1 TABLET BY MOUTH EVERY DAY 04/04/24   Eubanks, Jessica K, NP  lisinopril  (ZESTRIL ) 40 MG tablet Take 1 tablet (40 mg total) by mouth daily. 06/07/24   Fargo, Amy E, NP  loratadine (CLARITIN) 10 MG tablet Take 10 mg by mouth daily.    [provider]  MAGNESIUM-POTASSIUM PO Take 1 tablet by mouth daily. With zinc Patient not taking: Reported on 06/11/2024    [provider]  naproxen (NAPROSYN) 500 MG tablet Take 1 tablet (500 mg total) by mouth 2 (two) times daily. 08/22/24   Bauer, Collin S, PA-C  omeprazole  (PRILOSEC) 20 MG capsule TAKE 1 CAPSULE BY MOUTH EVERY DAY 08/22/24   Eubanks, Jessica K, NP  Pirfenidone  801 MG TABS Take 1 tablet (801 mg total) by mouth 3 (three) times daily with meals. 06/08/24   Geronimo Amel, MD  rosuvastatin  (CRESTOR ) 10 MG tablet TAKE 1 TABLET BY MOUTH EVERY DAY 04/27/24   Croitoru, Mihai, MD  tadalafil  (CIALIS ) 20 MG tablet Take 1 tablet (20 mg total) by mouth daily as needed. 08/22/23   Stoneking, Adine PARAS., MD  TURMERIC-GINGER PO Take 2,600 mg by mouth daily.    [provider]  UNABLE TO FIND Med Name: Taking experimental drug or possible placeb for lungs, managed by [facility] with [place]    [provider]

## 2024-09-05 NOTE — Progress Notes (Addendum)
  Progress Note   Patient: Vincent Meyers FMW:[id number] DOB: [birth year] DOA: 08/29/2024     7 DOS: the patient was seen and examined on 09/05/2024   Brief hospital course: Vincent Meyers was admitted to the hospital with the working diagnosis of spontaneous pneumothorax in the setting of interstitial lung disease.   78 year old with history of ILD, COPD, OSA, HTN, HLD, DM2 comes to the ED with shortness of breath.  Chest x-ray showed large right-sided spontaneous pneumothorax requiring chest tube placement.  Pulmonary team has been consulted.  Recurrent pneumothorax despite chest tube to suction.  CT chest showed moderate size pneumothorax but improved with suction.  Evaluated by CT surgery, no further intervention.     Assessment and Plan: * Pneumothorax on right Follow up chest radiograph with expanded right lung, chest tube in place,  No air leak today  Continue supplemental 02 per [place] and as needed analgesics.  Clamped tube today   ILD (interstitial lung disease) (HCC) No signs of acute exacerbation, continue supplemental 02 per [place]   COPD (chronic obstructive pulmonary disease) (HCC) No signs of acute exacerbation, continue bronchodilator therapy   Coronary artery disease involving native coronary artery of native heart without angina pectoris No chest pain   Essential hypertension Continue blood pressure monitoring Continue blood pressure control with lisinopril . amlodipine  and atenolol .   GERD (gastroesophageal reflux disease) Continue with pantoprazole   Hyperlipemia, mixed Continue with statin therapy   Obesity, class 1 Calculated BMI is 31.5         Subjective: Patient is feeling better, chest tube has been to gravity with no worsening dyspnea or chest pain   Physical Exam: Vitals:   09/05/24 0023 09/05/24 0440 09/05/24 0739 09/05/24 1115  BP: 116/63 116/66 128/63 (!) 138/55  Pulse: 64 67 71 71  Resp: 18 18 17 19   Temp: 98.2 F (36.8 C) 98.4 F (36.9 C) 98.4  F (36.9 C) 98.1 F (36.7 C)  TempSrc: Oral Oral Oral Oral  SpO2: 94% 98% 91% 96%  Weight:      Height:       Neurology awake and alert ENT with mild pallor Cardiovascular with S1 and S2 present and regular with no gallops, rubs or murmurs Respiratory with bilateral rales with no wheezing or rhonchi  Abdomen with no distention  No lower extremity edema Chest tube in place on the right.  Data Reviewed:    Family Communication: I spoke with patient's wife at the bedside, we talked in detail about patient's condition, plan of care and prognosis and all questions were addressed.   Disposition: Status is: Inpatient Remains inpatient appropriate because: chest tube management   Planned Discharge Destination: Home     Author: Elidia Toribio Furnace, MD 09/05/2024 2:31 PM  For on call review [email or web address].

## 2024-09-05 NOTE — Assessment & Plan Note (Signed)
 No chest pain

## 2024-09-05 NOTE — Progress Notes (Addendum)
 Patient defers chest tube dressing change citing pain due to hair near site of insertion and imminent removal. Offered to clip hair to ameliorate pain; patient continues to defer dressing change. If chest tube is not removed tomorrow, he will be amenable to a dressing change at that time.  Per report from AM RN, chest tube is to remain clamped without flushing as ordered.

## 2024-09-05 NOTE — Plan of Care (Signed)
   Problem: Education: Goal: Knowledge of General Education information will improve Description: Including pain rating scale, medication(s)/side effects and non-pharmacologic comfort measures Outcome: Progressing   Problem: Clinical Measurements: Goal: Respiratory complications will improve Outcome: Progressing

## 2024-09-05 NOTE — Plan of Care (Signed)
  Problem: Education: Goal: Knowledge of General Education information will improve Description: Including pain rating scale, medication(s)/side effects and non-pharmacologic comfort measures Outcome: Progressing   Problem: Health Behavior/Discharge Planning: Goal: Ability to manage health-related needs will improve 09/05/2024 0516 by Dionisio Patch, Ivar GRADE, RN Outcome: Progressing 09/05/2024 0516 by Dionisio Patch, Ivar GRADE, RN Outcome: Progressing   Problem: Clinical Measurements: Goal: Ability to maintain clinical measurements within normal limits will improve Outcome: Progressing

## 2024-09-05 NOTE — Progress Notes (Signed)
 Physical Therapy Treatment Patient Details Name: FRANCOIS ELK MRN: [id number] DOB: [birth year] Today's Date: 09/05/2024   History of Present Illness 78 y.o M adm 08/29/24 for increased SOB with Rt PTX. 10/8 chest tube, 10/9 2nd chest tube. PMH: ILD, COPD baseline 2L, OSA, HTN, HLD, DM, CAD    PT Comments  Pt making good progress with increasing activity tolerance. Ready for dc home from PT standpoint once medically ready.     If plan is discharge home, recommend the following: Assist for transportation;Help with stairs or ramp for entrance   Can travel by private vehicle        Equipment Recommendations  None recommended by PT    Recommendations for Other Services       Precautions / Restrictions Precautions Precautions: Fall Recall of Precautions/Restrictions: Intact Precaution/Restrictions Comments: Rt chest tube Restrictions Weight Bearing Restrictions Per Provider Order: No     Mobility  Bed Mobility Overal bed mobility: Modified Independent                  Transfers Overall transfer level: Modified independent Equipment used: Rollator (4 wheels)                    Ambulation/Gait Ambulation/Gait assistance: Supervision Gait Distance (Feet): 600 Feet Assistive device: Rollator (4 wheels) Gait Pattern/deviations: Step-through pattern, Decreased stride length Gait velocity: WFL Gait velocity interpretation: >2.62 ft/sec, indicative of community ambulatory   General Gait Details: Supervision for lines only. Pt amb on 3L O2 with SpO2 90-91%   Stairs             Wheelchair Mobility     Tilt Bed    Modified Rankin (Stroke Patients Only)       Balance Overall balance assessment: Mild deficits observed, not formally tested                                          Communication Communication Communication: No apparent difficulties  Cognition Arousal: Alert Behavior During Therapy: WFL for tasks  assessed/performed   PT - Cognitive impairments: No apparent impairments                         Following commands: Intact      Cueing Cueing Techniques: Verbal cues  Exercises      General Comments        Pertinent Vitals/Pain      Home Living                          Prior Function            PT Goals (current goals can now be found in the care plan section) Progress towards PT goals: Progressing toward goals    Frequency    Min 2X/week      PT Plan      Co-evaluation              AM-PAC PT 6 Clicks Mobility   Outcome Measure  Help needed turning from your back to your side while in a flat bed without using bedrails?: None Help needed moving from lying on your back to sitting on the side of a flat bed without using bedrails?: None Help needed moving to and from a bed to a chair (including a wheelchair)?: None Help needed standing up  from a chair using your arms (e.g., wheelchair or bedside chair)?: None Help needed to walk in hospital room?: None Help needed climbing 3-5 steps with a railing? : A Little 6 Click Score: 23    End of Session Equipment Utilized During Treatment: Oxygen Activity Tolerance: Patient tolerated treatment well Patient left: in bed;with call bell/phone within reach;with family/visitor present   PT Visit Diagnosis: Other abnormalities of gait and mobility (R26.89);Difficulty in walking, not elsewhere classified (R26.2)     Time: 8589-8572 PT Time Calculation (min) (ACUTE ONLY): 17 min  Charges:    $Gait Training: 8-22 mins PT General Charges $$ ACUTE PT VISIT: 1 Visit                     [facility] PT Acute Rehabilitation Services Office [phone]    [place] ORN [facility] 09/05/2024, 3:44 PM

## 2024-09-05 NOTE — Assessment & Plan Note (Signed)
Calculated BMI is 31.5 ? ?

## 2024-09-05 NOTE — Assessment & Plan Note (Addendum)
 No signs of acute exacerbation, continue supplemental 02 per [place]  Continue with pirfenidone 

## 2024-09-05 NOTE — Assessment & Plan Note (Signed)
 No signs of acute exacerbation, continue bronchodilator therapy.

## 2024-09-05 NOTE — Assessment & Plan Note (Signed)
 Continue with pantoprazole 

## 2024-09-06 ENCOUNTER — Inpatient Hospital Stay (HOSPITAL_COMMUNITY)

## 2024-09-06 DIAGNOSIS — R918 Other nonspecific abnormal finding of lung field: Secondary | ICD-10-CM | POA: Diagnosis not present

## 2024-09-06 DIAGNOSIS — J849 Interstitial pulmonary disease, unspecified: Secondary | ICD-10-CM | POA: Diagnosis not present

## 2024-09-06 DIAGNOSIS — J939 Pneumothorax, unspecified: Secondary | ICD-10-CM | POA: Diagnosis not present

## 2024-09-06 DIAGNOSIS — I10 Essential (primary) hypertension: Secondary | ICD-10-CM | POA: Diagnosis not present

## 2024-09-06 DIAGNOSIS — J449 Chronic obstructive pulmonary disease, unspecified: Secondary | ICD-10-CM | POA: Diagnosis not present

## 2024-09-06 DIAGNOSIS — E782 Mixed hyperlipidemia: Secondary | ICD-10-CM

## 2024-09-06 DIAGNOSIS — J90 Pleural effusion, not elsewhere classified: Secondary | ICD-10-CM | POA: Diagnosis not present

## 2024-09-06 LAB — BASIC METABOLIC PANEL WITH GFR
Anion gap: 8 (ref 5–15)
BUN: 9 mg/dL (ref 8–23)
CO2: 27 mmol/L (ref 22–32)
Calcium: 8.7 mg/dL — ABNORMAL LOW (ref 8.9–10.3)
Chloride: 96 mmol/L — ABNORMAL LOW (ref 98–111)
Creatinine, Ser: 0.54 mg/dL — ABNORMAL LOW (ref 0.61–1.24)
GFR, Estimated: >60 mL/min (ref >60)
Glucose, Bld: 120 mg/dL — ABNORMAL HIGH (ref 70–99)
Potassium: 4.7 mmol/L (ref 3.5–5.1)
Sodium: 131 mmol/L — ABNORMAL LOW (ref 135–145)

## 2024-09-06 LAB — CBC
HCT: 41 % (ref 39.0–52.0)
Hemoglobin: 13.7 g/dL (ref 13.0–17.0)
MCH: 27.4 pg (ref 26.0–34.0)
MCHC: 33.4 g/dL (ref 30.0–36.0)
MCV: 82 fL (ref 80.0–100.0)
Platelets: 201 K/uL (ref 150–400)
RBC: 5 MIL/uL (ref 4.22–5.81)
RDW: 13.1 % (ref 11.5–15.5)
WBC: 12.4 K/uL — ABNORMAL HIGH (ref 4.0–10.5)
nRBC: 0 % (ref 0.0–0.2)

## 2024-09-06 LAB — MAGNESIUM: Magnesium: 1.9 mg/dL (ref 1.7–2.4)

## 2024-09-06 NOTE — Plan of Care (Signed)
   Problem: Education: Goal: Knowledge of General Education information will improve Description Including pain rating scale, medication(s)/side effects and non-pharmacologic comfort measures Outcome: Progressing

## 2024-09-06 NOTE — Progress Notes (Signed)
 NAME:  Vincent Meyers, MRN:  [id number], DOB:  Jul 29, 1946, LOS: 8 ADMISSION DATE:  08/29/2024, CONSULTATION DATE: 10/8  REFERRING MD: EMERSON Fusi CHIEF COMPLAINT:  Shortness of breath   Initial History of Present Illness:  Patient is a 78 year old male with significant past medical history of ILD-2 L of oxygen baseline for the last couple years, (follows up with MD Geronimo at [facility] pulmonary outpatient), OSA, COPD, hypertension, HLD, CAD, diabetes, anxiety, and acid reflux who presented to the emergency department at [facility] with worsening shortness of breath.  Initial chest x-ray was completed showing large right pneumothorax and chronic interstitial lung disease changes.  Right chest tube was placed by ED staff.  TRH consulted for admission and PCCM consulted for assistance in chest tube management.   Upon initial assessment of patient within ED, patient hemodynamically stable with some hypertension notable.  Patient on 4 L nasal cannula with O2 sats 94 to 95%, respiratory rate low 20s. Patient reports that had similar pain/discomfort over a week ago but went away. Patient noticed chest pain and eventually SOB development after heavy lifting yesterday afternoon. SOB progressively got worse with exertion. Patient denies dizziness, vertigo, syncope, hemoptysis, fevers, chills, sick contacts. Patient endorses slight increase in cough after incident began to occur.  Upon assessing the [place] drain system of the chest tube-noticeable airleak was evaluated from and upon assessing chest tube site, chest tube was completely out chest.  Immediately called for stat chest x-ray to come to be completed, and placement of split gauze dressing.  Repeat chest x-ray showing significant pneumothorax on right side.  Discussed with patient and wife about emergent placement of second chest tube.  Chest tube placed more anteriorly. Right lung re-expansion noted.   Overnight he did not tolerate waterseal and had  pneumothorax reexpansion. He has a persistent airleak    Pertinent  Medical History   Past Medical History:  Diagnosis Date   Acid reflux    Angina pectoris    Anxiety    Arthritis    Coronary artery disease    Enlarged prostate    per [facility] new patient packet   Erectile dysfunction    High blood sugar    per [facility] new patient packet   Hyperlipidemia    Hypertension    IBS (irritable bowel syndrome)    ILD (interstitial lung disease) (HCC)    OSA (obstructive sleep apnea)    Pulmonary fibrosis (HCC)    per [facility] new patient packet     Significant Hospital Events: Including procedures, antibiotic start and stop dates in addition to other pertinent events   10/8 Pulm Consult for Right Pneumo, Hx of ILD . Chest tube noted to be out and replaced  10/9 Persistent airleak, small apical pneumo on CXR   Interim History / Subjective:  Patient stable, on 2 LNC. Pneumothorax stable Pt inadvertently pulled chest tube this afternoon while in the bathroom   Objective    Blood pressure (!) 147/72, pulse 67, temperature 97.8 F (36.6 C), temperature source Oral, resp. rate 20, height 5' 7 (1.702 m), weight 91.3 kg, SpO2 96%.        Intake/Output Summary (Last 24 hours) at 09/06/2024 1714 Last data filed at 09/06/2024 1211 Gross per 24 hour  Intake 730 ml  Output 2460 ml  Net -1730 ml   Filed Weights   09/01/24 0627  Weight: 91.3 kg    Examination: General: Elderly male, obese and not in acute distress HEENT: Normocephalic, atraumatic  CV: Regular rate and rhythm, normal S1, normal S2 Pulm: Bilateral breath sounds clear Abs: Soft, nontender, nondistended Extremities: No edema Skin: No rash Neuro: A&O x 3  Resolved problem list   Assessment and Plan  Spontaneous pneumothorax secondary to ILD -MD Ramaswamy outpatient for ILD/pulmonary fibrosis -Per patient, has been on 2 L of nasal cannula chronically for approximately 2 years   Chest xray stable Chest tube pulled  out inadvertently - had been clamped since yesterday afternoon Pt is stable on home oxygen No chest pain, vitals stable Pt has a f/up in pulm clinic in few weeks  History of COPD/OSA on CPAP Chronically on 2 L nasal cannula   Other problems managed by primary: Hypertension Hyperlipidemia Anxiety Diabetes type 2 CAD Acid reflux  We will sign off, please call us  if we can be of any further assistance.  Labs   CBC: Recent Labs  Lab 09/02/24 0228 09/03/24 0327 09/04/24 0231 09/05/24 0236 09/06/24 0309  WBC 14.4* 14.4* 12.5* 12.7* 12.4*  HGB 14.2 13.7 13.5 13.7 13.7  HCT 43.0 41.2 41.1 41.6 41.0  MCV 81.3 80.8 82.0 81.9 82.0  PLT 197 182 190 203 201    Basic Metabolic Panel: Recent Labs  Lab 09/02/24 0228 09/03/24 0327 09/04/24 0231 09/05/24 0236 09/06/24 0309  NA 129* 130* 132* 132* 131*  K 4.5 4.4 4.4 4.6 4.7  CL 93* 96* 96* 96* 96*  CO2 27 25 27 27 27   GLUCOSE 139* 143* 130* 128* 120*  BUN 9 14 10 10 9   CREATININE 0.63 0.53* 0.50* 0.53* 0.54*  CALCIUM  8.8* 8.6* 8.6* 8.7* 8.7*  MG 1.9 1.9 1.9 2.0 1.9   GFR: Estimated Creatinine Clearance: 83.3 mL/min (A) (by C-G formula based on SCr of 0.54 mg/dL (L)). Recent Labs  Lab 09/03/24 0327 09/04/24 0231 09/05/24 0236 09/06/24 0309  WBC 14.4* 12.5* 12.7* 12.4*    Liver Function Tests: No results for input(s): AST, ALT, ALKPHOS, BILITOT, PROT, ALBUMIN in the last 168 hours.  No results for input(s): LIPASE, AMYLASE in the last 168 hours. No results for input(s): AMMONIA in the last 168 hours.  ABG    Component Value Date/Time   HCO3 28.0 09/09/2023 1319   TCO2 29 09/09/2023 1319   O2SAT 76 09/09/2023 1319     Coagulation Profile: No results for input(s): INR, PROTIME in the last 168 hours.  Cardiac Enzymes: No results for input(s): CKTOTAL, CKMB, CKMBINDEX, TROPONINI in the last 168 hours.  HbA1C: Hgb A1c MFr Bld  Date/Time Value Ref Range Status  06/11/2024  02:59 PM 6.6 (H) <5.7 % Final    Comment:    For someone without known diabetes, a hemoglobin A1c value of 6.5% or greater indicates that they may have  diabetes and this should be confirmed with a follow-up  test. . For someone with known diabetes, a value <7% indicates  that their diabetes is well controlled and a value  greater than or equal to 7% indicates suboptimal  control. A1c targets should be individualized based on  duration of diabetes, age, comorbid conditions, and  other considerations. . Currently, no consensus exists regarding use of hemoglobin A1c for diagnosis of diabetes for children. SABRA   03/02/2024 03:19 PM 7.1 (H) <5.7 % of total Hgb Final    Comment:    For someone without known diabetes, a hemoglobin A1c value of 6.5% or greater indicates that they may have  diabetes and this should be confirmed with a follow-up  test. . For  someone with known diabetes, a value <7% indicates  that their diabetes is well controlled and a value  greater than or equal to 7% indicates suboptimal  control. A1c targets should be individualized based on  duration of diabetes, age, comorbid conditions, and  other considerations. . Currently, no consensus exists regarding use of hemoglobin A1c for diagnosis of diabetes for children. .     CBG: No results for input(s): GLUCAP in the last 168 hours.  Review of Systems:      Past Medical History:  He,  has a past medical history of Acid reflux, Angina pectoris, Anxiety, Arthritis, Coronary artery disease, Enlarged prostate, Erectile dysfunction, High blood sugar, Hyperlipidemia, Hypertension, IBS (irritable bowel syndrome), ILD (interstitial lung disease) (HCC), OSA (obstructive sleep apnea), and Pulmonary fibrosis (HCC).   Surgical History:   Past Surgical History:  Procedure Laterality Date   APPENDECTOMY     COLON SURGERY     perforated bowel and hernia repair   COLONOSCOPY  2024   per [facility] new patient packet    COLONOSCOPY N/A 05/17/2024   Procedure: COLONOSCOPY;  Surgeon: Legrand Victory LITTIE DOUGLAS, MD;  Location: [facility];  Service: Gastroenterology;  Laterality: N/A;   HEMOSTASIS CLIP PLACEMENT  05/17/2024   Procedure: CONTROL OF HEMORRHAGE, GI TRACT, ENDOSCOPIC, BY CLIPPING OR OVERSEWING;  Surgeon: Legrand Victory LITTIE DOUGLAS, MD;  Location: [facility];  Service: Gastroenterology;;   ILIAC ARTERY ANEURYSM REPAIR Left 2006   Dr. Oris (redo surgery)   POLYPECTOMY  05/17/2024   Procedure: POLYPECTOMY, INTESTINE;  Surgeon: Legrand Victory LITTIE DOUGLAS, MD;  Location: [facility];  Service: Gastroenterology;;   REPLACEMENT TOTAL KNEE Left 2008   Dr.Yeats, per [facility] new patient packet   REPLACEMENT TOTAL KNEE Right 2018   Dr.J, per [facility] new patient packet   REPLACEMENT TOTAL KNEE BILATERAL     RIGHT HEART CATH N/A 09/09/2023   Procedure: RIGHT HEART CATH;  Surgeon: Rolan Ezra RAMAN, MD;  [facility]: [facility] INVASIVE CV LAB;  Service: Cardiovascular;  Laterality: N/A;   TONSILLECTOMY     age 72     Social History:   reports that he quit smoking about 36 years ago. His smoking use included cigarettes. He started smoking about 59 years ago. He has a 46 pack-year smoking history. He has been exposed to tobacco smoke. He has never used smokeless tobacco. He reports that he does not currently use alcohol after a past usage of about 4.0 - 5.0 standard drinks of alcohol per week. He reports that he does not use drugs.   Family History:  His family history includes Alzheimer's disease in his father and sister; Heart attack in his father; Heart murmur in his daughter; Hypertension in his daughter; Obesity in his daughter; Other in his sister; Parkinson's disease in his mother; Stroke in his father. There is no history of Colon cancer, Esophageal cancer, Rectal cancer, or Stomach cancer.   Allergies No Known Allergies   Home Medications  Prior to Admission medications   Medication Sig Start Date End Date Taking? Authorizing Provider   amLODipine  (NORVASC ) 5 MG tablet TAKE 1 TABLET (5 MG TOTAL) BY MOUTH DAILY. 05/28/24 05/23/25  [name], NP  aspirin EC 81 MG tablet Take 81 mg by mouth daily.    [provider]  atenolol  (TENORMIN ) 50 MG tablet Take 1.5 tablets (75 mg total) by mouth daily. 06/07/24   Fargo, Amy E, NP  Coenzyme Q10 (COQ10 PO) Take 500 mg by mouth daily.  [provider]  FIBER PO Take 4 capsules by mouth 2 (two) times daily.    [provider]  Flaxseed, Linseed, (FLAXSEED OIL PO) Take 1 capsule by mouth daily.    [provider]  fluticasone  (FLONASE ) 50 MCG/ACT nasal spray Place 2 sprays into both nostrils daily. 12/27/22   [provider]  hydrochlorothiazide  (HYDRODIURIL ) 25 MG tablet TAKE 1 TABLET BY MOUTH EVERY DAY 04/04/24   Eubanks, Jessica K, NP  lisinopril  (ZESTRIL ) 40 MG tablet Take 1 tablet (40 mg total) by mouth daily. 06/07/24   Fargo, Amy E, NP  loratadine (CLARITIN) 10 MG tablet Take 10 mg by mouth daily.    [provider]  MAGNESIUM-POTASSIUM PO Take 1 tablet by mouth daily. With zinc Patient not taking: Reported on 06/11/2024    [provider]  naproxen (NAPROSYN) 500 MG tablet Take 1 tablet (500 mg total) by mouth 2 (two) times daily. 08/22/24   Bauer, Collin S, PA-C  omeprazole  (PRILOSEC) 20 MG capsule TAKE 1 CAPSULE BY MOUTH EVERY DAY 08/22/24   Eubanks, Jessica K, NP  Pirfenidone  801 MG TABS Take 1 tablet (801 mg total) by mouth 3 (three) times daily with meals. 06/08/24   Geronimo Amel, MD  rosuvastatin  (CRESTOR ) 10 MG tablet TAKE 1 TABLET BY MOUTH EVERY DAY 04/27/24   Croitoru, Mihai, MD  tadalafil  (CIALIS ) 20 MG tablet Take 1 tablet (20 mg total) by mouth daily as needed. 08/22/23   Stoneking, Adine PARAS., MD  TURMERIC-GINGER PO Take 2,600 mg by mouth daily.    [provider]  UNABLE TO FIND Med Name: Taking experimental drug or possible placeb for lungs, managed by [employer] with [place]    [provider]

## 2024-09-06 NOTE — Progress Notes (Signed)
 Mobility Specialist Progress Note:    09/06/24 1130  Mobility  Activity Ambulated with assistance  Level of Assistance Independent after set-up  Assistive Device Front wheel walker  Distance Ambulated (ft) 500 ft  Activity Response Tolerated well  Mobility Referral Yes  Mobility visit 1 Mobility  Mobility Specialist Start Time (ACUTE ONLY) 1130  Mobility Specialist Stop Time (ACUTE ONLY) 1145  Mobility Specialist Time Calculation (min) (ACUTE ONLY) 15 min   Received pt in chair eager for session. No c/o any symptoms. Pt moving and ambulating well. Returned pt to room w/ all needs met.   Venetia Keel Mobility Specialist Please Neurosurgeon or Rehab Office at [phone]

## 2024-09-06 NOTE — Progress Notes (Signed)
  Progress Note   Patient: Vincent Meyers FMW:[id number] DOB: 06/09/1946 DOA: 08/29/2024     8 DOS: the patient was seen and examined on 09/06/2024   Brief hospital course: Vincent Meyers was admitted to the hospital with the working diagnosis of spontaneous pneumothorax in the setting of interstitial lung disease.   78 year old with history of ILD, COPD, OSA, HTN, HLD, DM2 comes to the ED with dyspnea. Patient developed acute dyspnea and right sided chest pain. He required increased 02 flow from 2 to 4 L/min per [place]. Rapidly progressive dyspnea to the point where he was symptomatic with minimal efforts.  On his initial physical examination his blood pressure was 144/69, HR 76, RR 21 and 02 saturation 95% Lungs with bilateral rales, with no wheezing, heart with S1 and S2 present and regular, abdomen with no distention, no lower extremity edema.    Chest x-ray showed large right-sided spontaneous pneumothorax requiring chest tube placement.  Pulmonary team has been consulted.  Recurrent pneumothorax despite chest tube to suction.  CT chest showed moderate size pneumothorax but improved with suction.  Evaluated by CT surgery, no further intervention.     Assessment and Plan: * Pneumothorax on right Today follow up chest radiograph with expanded right lung, chest tube in place,  It has been clamped   Continue supplemental 02 per [place] and as needed analgesics.  Plan to remove tube today  ILD (interstitial lung disease) (HCC) No signs of acute exacerbation, continue supplemental 02 per    COPD (chronic obstructive pulmonary disease) (HCC) No signs of acute exacerbation, continue bronchodilator therapy   Coronary artery disease involving native coronary artery of native heart without angina pectoris No chest pain   Essential hypertension Continue blood pressure monitoring Continue blood pressure control with lisinopril . amlodipine  and atenolol .   GERD (gastroesophageal reflux disease) Continue  with pantoprazole   Hyperlipemia, mixed Continue with statin therapy   Obesity, class 1 Calculated BMI is 31.5         Subjective: Patient with improvement in dyspnea no chest pain has been ambulating   Physical Exam: Vitals:   09/05/24 2009 09/06/24 0421 09/06/24 0751 09/06/24 1056  BP: 137/75 122/61 119/65 (!) 109/59  Pulse: 61 63 72 71  Resp: 18 16 16 20   Temp: 98.3 F (36.8 C) (!) 97.5 F (36.4 C) 98.4 F (36.9 C) 97.9 F (36.6 C)  TempSrc: Oral Oral Oral Oral  SpO2: 95% 97% 96% 97%  Weight:      Height:       Neurology awake and alert ENT with mild pallor Cardiovascular with S1 and S2 present and regular with no gallops,  Respiratory with mild rales bilaterally with no wheezing or rhonchi Abdomen with no distention  No lower extremity edema  Data Reviewed:    Family Communication: no family at the bedside   Disposition: Status is: Inpatient Remains inpatient appropriate because: chest tube   Planned Discharge Destination: Home      Author: Elidia Toribio Furnace, MD 09/06/2024 12:15 PM  For on call review [email or web address].

## 2024-09-06 NOTE — Progress Notes (Signed)
 Called into patient room due to patient accidentally dislodged his right upper chest tube.  Upon entering room chest tube in floor and Tegaderm and Biopatch still in place.   Vaseline gauze, dry gauze and Tegaderm placed at site and vital signs obtained. Dr. Pleas notified.

## 2024-09-07 ENCOUNTER — Ambulatory Visit: Admitting: Adult Health

## 2024-09-07 ENCOUNTER — Other Ambulatory Visit (HOSPITAL_COMMUNITY): Payer: Self-pay

## 2024-09-07 DIAGNOSIS — K219 Gastro-esophageal reflux disease without esophagitis: Secondary | ICD-10-CM

## 2024-09-07 DIAGNOSIS — E66811 Obesity, class 1: Secondary | ICD-10-CM

## 2024-09-07 DIAGNOSIS — I2510 Atherosclerotic heart disease of native coronary artery without angina pectoris: Secondary | ICD-10-CM | POA: Diagnosis not present

## 2024-09-07 DIAGNOSIS — J849 Interstitial pulmonary disease, unspecified: Secondary | ICD-10-CM | POA: Diagnosis not present

## 2024-09-07 DIAGNOSIS — J939 Pneumothorax, unspecified: Secondary | ICD-10-CM | POA: Diagnosis not present

## 2024-09-07 DIAGNOSIS — J449 Chronic obstructive pulmonary disease, unspecified: Secondary | ICD-10-CM | POA: Diagnosis not present

## 2024-09-07 MED ORDER — GUAIFENESIN ER 600 MG PO TB12: 600.0000 mg | ORAL_TABLET | Freq: Two times a day (BID) | ORAL | Status: DC

## 2024-09-07 MED ORDER — NAPROXEN 500 MG PO TABS
500.0000 mg | ORAL_TABLET | Freq: Two times a day (BID) | ORAL | 0 refills | Status: AC | PRN
  Filled 2024-09-07: qty 15, 8d supply, fill #0

## 2024-09-07 MED ORDER — GUAIFENESIN ER 600 MG PO TB12
600.0000 mg | ORAL_TABLET | Freq: Two times a day (BID) | ORAL | 0 refills | Status: AC
  Filled 2024-09-07: qty 60, 30d supply, fill #0

## 2024-09-07 MED ORDER — IPRATROPIUM-ALBUTEROL 0.5-2.5 (3) MG/3ML IN SOLN
3.0000 mL | Freq: Four times a day (QID) | RESPIRATORY_TRACT | 0 refills | Status: AC | PRN
  Filled 2024-09-07: qty 360, 30d supply, fill #0

## 2024-09-07 NOTE — Progress Notes (Signed)
 Mobility Specialist Progress Note:    09/07/24 1138  Mobility  Activity Ambulated independently  Level of Assistance Independent  Assistive Device None  Distance Ambulated (ft) 25 ft  Activity Response Tolerated well  Mobility Referral Yes  Mobility visit 1 Mobility  Mobility Specialist Start Time (ACUTE ONLY) 1138  Mobility Specialist Stop Time (ACUTE ONLY) 1146  Mobility Specialist Time Calculation (min) (ACUTE ONLY) 8 min   Received pt ambulating in room preparing for d/c. No c/o any symptoms. Pt moving and ambulating well w/ chest tube out. No pain. Left pt in room w/ all needs met.   Venetia Keel Mobility Specialist Please Neurosurgeon or Rehab Office at [phone]

## 2024-09-07 NOTE — Progress Notes (Signed)
 Verified patient readiness for discharge with Dr Arrien

## 2024-09-07 NOTE — Progress Notes (Signed)
 Occupational Therapy Treatment Patient Details Name: Vincent Meyers MRN: [id number] DOB: [birth year] Today's Date: 09/07/2024   History of present illness 78 y.o M adm 08/29/24 for increased SOB with Rt PTX. 10/8 chest tube, 10/9 2nd chest tube. PMH: ILD, COPD baseline 2L, OSA, HTN, HLD, DM, CAD   OT comments  Pt. Seen for skilled OT treatment session with focus on energy conservation. Provided energy conservation hand out of the 5Ps and reviewed with pt. Pt. Receptive to education and also able to describe strategies already in place at his home.  Pt. Reports he is ambulating without assistance in his room and demonstrated walking from chair to sink without DME or assistance. Light cues for o2 tubing management during ambulation.  Agree with d/c recommendations.  Cont. With acute OT POC.        If plan is discharge home, recommend the following:  Assistance with cooking/housework   Equipment Recommendations  None recommended by OT    Recommendations for Other Services      Precautions / Restrictions Precautions Precautions: Fall       Mobility Bed Mobility               General bed mobility comments: seated in chair upon arrival    Transfers Overall transfer level: Modified independent Equipment used: None               General transfer comment: sit/stand from chair and amb. to/from sink with no DME, no LOB, cues for o2 tubing management.     Balance                                           ADL either performed or assessed with clinical judgement   ADL Overall ADL's : Needs assistance/impaired     Grooming: Supervision/safety;Standing Grooming Details (indicate cue type and reason): light cues for o2 tubing management when amb. to sink from chair                   Toilet Transfer Details (indicate cue type and reason): pt. reports he has been amb. to/from b.room without assistance and has o2 tubing long enoughto get to/from          Functional mobility during ADLs: Supervision/safety General ADL Comments: provided 5 P's energy conservation handouts and rewiewed.  pt. able to verbalize understanding and provide examples of implementation and systems in place at home.  has personal pulse ox that he uses to monitor o2 levels but states he can also feel when he needs to re adjust and work on PLB.    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  Verbalized only thing he hasn't developed a strategy for is heavy lifting and he can not always wait until someone is with him to lift the heavy item for the car.  He is trying to create a pully/lifting system that could also be on wheels to roll around his work garage.      Pertinent Vitals/ Pain       Pain Assessment Pain Assessment: No/denies pain  Home Living                                          Prior Functioning/Environment              Frequency  Min 2X/week        Progress Toward Goals  OT Goals(current goals can now be found in the care plan section)  Progress towards OT goals: Progressing toward goals     Plan      Co-evaluation                  AM-PAC OT 6 Clicks Daily Activity     Outcome Measure   Help from another person eating meals?: None Help from another person taking care of personal grooming?: A Little Help from another person toileting, which includes using toliet, bedpan, or urinal?: A Little Help from another person bathing (including washing, rinsing, drying)?: A Little Help from another person to put on and taking off regular upper body clothing?: A Little Help from another person to put on and taking off regular lower body clothing?: A Little 6 Click Score: 19    End of Session Equipment Utilized During Treatment: Oxygen  OT Visit Diagnosis: Unsteadiness on feet (R26.81);Other abnormalities of gait and mobility (R26.89)   Activity Tolerance Patient tolerated treatment well   Patient Left in chair;with call bell/phone within reach   Nurse Communication Other (comment) (rn states ok to work with pt., notified rn per pt. request to check banadage as he said it was coming lose)        Time: 0943-1000 OT Time Calculation (min): 17 min  Charges: OT General Charges $OT Visit: 1 Visit OT Treatments $Self Care/Home Management : 8-22 mins  Randall, COTA/L Acute Rehabilitation [phone]   CHRISTELLA Nest Lorraine-COTA/L  09/07/2024, 10:08 AM

## 2024-09-07 NOTE — TOC Transition Note (Signed)
 Transition of Care [facility]) - Discharge Note   Patient Details  Name: Vincent Meyers MRN: [id number] Date of Birth: 18-May-1946  [facility] CM/SW Contact:  Waddell Barnie Rama, RN Phone Number: 09/07/2024, 12:19 PM   Clinical Narrative:    For dc today, he has no needs. Wife will transport him home.          Patient Goals and CMS Choice            Discharge Placement                       Discharge Plan and Services Additional resources added to the After Visit Summary for                                       Social Drivers of Health (SDOH) Interventions SDOH Screenings   Food Insecurity: No Food Insecurity (09/01/2024)  Housing: Low Risk  (09/01/2024)  Transportation Needs: No Transportation Needs (09/01/2024)  Utilities: Not At Risk (09/01/2024)  Alcohol Screen: Low Risk  (01/10/2024)  Depression (PHQ2-9): Low Risk  (03/02/2024)  Recent Concern: Depression (PHQ2-9) - Medium Risk (12/22/2023)  Financial Resource Strain: Low Risk  (01/10/2024)  Physical Activity: Inactive (01/10/2024)  Social Connections: Unknown (09/01/2024)  Stress: No Stress Concern Present (01/10/2024)  Tobacco Use: Medium Risk (09/04/2024)  Health Literacy: Adequate Health Literacy (01/10/2024)     Readmission Risk Interventions    08/31/2024    3:50 PM  Readmission Risk Prevention Plan  Medication Screening Complete  Transportation Screening Complete

## 2024-09-07 NOTE — Discharge Summary (Signed)
 Physician Discharge Summary   Patient: Vincent Meyers MRN: [id number] DOB: [birth year]  Admit date:     08/29/2024  Discharge date: 09/07/24  Discharge Physician: Elidia Sieving Damyan Corne   PCP: [name], NP   Recommendations at discharge:    Patient will continue supplemental 02 per [place] to keep 02 saturation 88% or greater.  Added trial of albuterol / ipratropium, and guaifenesin, follow up with pulmonary as outpatient.  Follow up with Harlene Caro NP in 7 to 10 days Follow up with Pulmonary as scheduled.    Discharge Diagnoses: Principal Problem:   Pneumothorax on right Active Problems:   ILD (interstitial lung disease) (HCC)   COPD (chronic obstructive pulmonary disease) (HCC)   Coronary artery disease involving native coronary artery of native heart without angina pectoris   Essential hypertension   GERD (gastroesophageal reflux disease)   Hyperlipemia, mixed   Obesity, class 1  Resolved Problems:   * No resolved hospital problems. [facility] Course: [name] was admitted to the hospital with the working diagnosis of spontaneous pneumothorax in the setting of interstitial lung disease.   78 year old with history of ILD, COPD, OSA, HTN, HLD, DM2 comes to the ED with dyspnea. Patient developed acute dyspnea and right sided chest pain. He required increased 02 flow from 2 to 4 L/min per [place]. Rapidly progressive dyspnea to the point where he was symptomatic with minimal efforts.  On his initial physical examination his blood pressure was 144/69, HR 76, RR 21 and 02 saturation 95% Lungs with bilateral rales, with no wheezing, heart with S1 and S2 present and regular, abdomen with no distention, no lower extremity edema.   Na 130, K 4.3 Cl 93 bicarbonate 25 glucose 235, bun 8 cr 0,53  High sensitive troponin 7  Wbc 12.7 hgb 15.4 plt 205  Sars covid 19 negative Influenza negative RSV negative   Chest radiograph with large right pneumothorax with left sided mediastinal  shift, left lung with diffuse interstitial infiltrates.   EKG 92 bpm, left axis deviation, left anterior fascicular block, qtc 405, sinus rhythm with no significant ST segment or T wave changes.   Pulmonary was consulted and patient underwent chest tube placement on the right side.  Chest tube connected to suction.  CT chest showed moderate size pneumothorax but improved with suction.  Evaluated by CT surgery, no further intervention.  Right lung expanded well, tube was placed to gravity and then it was clamped.  Follow up chest radiograph with resolution of pneumothorax.  Plan to continue follow up with pulmonary as outpatient, continue supplemental 02 per [place] to keep 02 saturation 88% or greater.      Assessment and Plan: * Pneumothorax on right Patient was placed on supplemental 0-2 per [place] and chest tube to the right chest.  10/12 follow up CT chest with persistent right pneumothorax, mild to moderate in volume with right chest with right chest wall pigtail thoracostomy tube in place.  Small focus of pneumomediastinum along the right inferior heart border.  Positive interstitial reticulation and basilar subpleural honeycombing. Traction bronchiectasis.  Right upper lobe with thin walled cyst in the superior segment (unchanged).   CT surgery consulted, recommendation to avoid surgical intervention if possible due to severe pulmonary fibrosis.  Fortunately his right lung continue to expand with right chest tube.   Follow up radiograph with resolution of pneumothorax.  10/16 chest tube removed with good toleration.  At the time of discharge his oxygenation remains stable,   ILD (interstitial  lung disease) (HCC) No signs of acute exacerbation, continue supplemental 02 per [place]  Continue with pirfenidone    COPD (chronic obstructive pulmonary disease) (HCC) No signs of acute exacerbation, continue bronchodilator therapy   Coronary artery disease involving native coronary artery of  native heart without angina pectoris No chest pain   Essential hypertension Continue blood pressure control with lisinopril , hydrochlorothiazide , amlodipine  and atenolol .   GERD (gastroesophageal reflux disease) Continue with pantoprazole   Hyperlipemia, mixed Continue with statin therapy   Obesity, class 1 Calculated BMI is 31.5       Consultants: pulmonary , CT surgery  Procedures performed: right chest tube placement   Disposition: Home Diet recommendation:  Cardiac diet DISCHARGE MEDICATION: Allergies as of 09/07/2024   No Known Allergies      Medication List     STOP taking these medications    UNABLE TO FIND       TAKE these medications    amLODipine  5 MG tablet Commonly known as: NORVASC  TAKE 1 TABLET (5 MG TOTAL) BY MOUTH DAILY.   aspirin EC 81 MG tablet Take 81 mg by mouth daily.   atenolol  50 MG tablet Commonly known as: TENORMIN  Take 1.5 tablets (75 mg total) by mouth daily.   COQ10 PO Take 500 mg by mouth daily.   FIBER PO Take 4 capsules by mouth 2 (two) times daily.   FLAXSEED OIL PO Take 1 capsule by mouth daily.   fluticasone  50 MCG/ACT nasal spray Commonly known as: FLONASE  Place 2 sprays into both nostrils daily.   guaiFENesin 600 MG 12 hr tablet Commonly known as: MUCINEX Take 1 tablet (600 mg total) by mouth 2 (two) times daily.   hydrochlorothiazide  25 MG tablet Commonly known as: HYDRODIURIL  TAKE 1 TABLET BY MOUTH EVERY DAY   ipratropium-albuterol  0.5-2.5 (3) MG/3ML Soln Commonly known as: DUONEB Take 3 mLs by nebulization every 6 (six) hours as needed (shortness of breath).   lisinopril  40 MG tablet Commonly known as: ZESTRIL  Take 1 tablet (40 mg total) by mouth daily.   loratadine 10 MG tablet Commonly known as: CLARITIN Take 10 mg by mouth daily as needed for allergies.   MAGNESIUM PO Take 2 tablets by mouth daily.   naproxen 500 MG tablet Commonly known as: NAPROSYN Take 1 tablet (500 mg total) by  mouth 2 (two) times daily as needed (as needed for pain). What changed:  when to take this reasons to take this   omeprazole  20 MG capsule Commonly known as: PRILOSEC TAKE 1 CAPSULE BY MOUTH EVERY DAY   Pirfenidone  801 MG Tabs Take 1 tablet (801 mg total) by mouth 3 (three) times daily with meals.   rosuvastatin  10 MG tablet Commonly known as: CRESTOR  TAKE 1 TABLET BY MOUTH EVERY DAY   tadalafil  20 MG tablet Commonly known as: CIALIS  Take 1 tablet (20 mg total) by mouth daily as needed.   TURMERIC-GINGER PO Take 2,600 mg by mouth daily.               Durable Medical Equipment  (From admission, onward)           Start     Ordered   09/07/24 0000  For home use only DME Nebulizer machine       Question Answer Comment  Patient needs a nebulizer to treat with the following condition Bronchitis   Length of Need 12 Months   Additional equipment included Administration kit      09/07/24 1211  Follow-up Information     [name], NP Follow up.   Specialty: Geriatric Medicine Contact information: [address] [place] [address] [phone]                Discharge Exam: Fredricka Weights   09/01/24 9372 09/07/24 0506  Weight: 91.3 kg 90.2 kg   BP (!) 140/67 (BP Location: Left Arm)   Pulse 69   Temp (!) 97.5 F (36.4 C) (Oral)   Resp 20   Ht 5' 7 (1.702 m)   Wt 90.2 kg   SpO2 99%   BMI 31.15 kg/m   Neurology awake and alert ENT with no pallor or icterus Cardiovascular with S1 and S2 present and regular with no gallops, rubs or murmurs Respiratory with bilateral rales with no wheezing or rhonchi,  Abdomen with no distention  No lower extremity edema   Condition at discharge: stable  The results of significant diagnostics from this hospitalization (including imaging, microbiology, ancillary and laboratory) are listed below for reference.   Imaging Studies: DG CHEST PORT 1 VIEW Result Date: 09/06/2024 EXAM:  1 VIEW(S) XRAY OF THE CHEST 09/06/2024 10:47:00 AM COMPARISON: 09/05/2024 CLINICAL HISTORY: pneumothorax ; Pt states no cp FINDINGS: LINES, TUBES AND DEVICES: Stable right pigtail catheter in place. LUNGS AND PLEURA: Stable small bilateral pleural effusions. Stable bilateral reticular opacities. No pneumothorax. HEART AND MEDIASTINUM: No acute abnormality of the cardiac and mediastinal silhouettes. BONES AND SOFT TISSUES: No acute osseous abnormality. IMPRESSION: 1. Stable right pigtail catheter in place. 2. Stable small bilateral pleural effusions. 3. Stable bilateral reticular opacities. Electronically signed by: Oneil Devonshire MD 09/06/2024 02:35 PM EDT RP Workstation: [id number]   DG Chest 2 View Result Date: 09/05/2024 CLINICAL DATA:  [id number] Pneumothorax on right [id number]. EXAM: CHEST - 2 VIEW COMPARISON:  09/04/2024. FINDINGS: Low lung volume. Redemonstration of right-sided pleural drainage catheter. No discrete right pneumothorax seen. There is small right pleural effusion, grossly similar to the prior study and probable new small left pleural effusion. No left pneumothorax. Bilateral lung fields are otherwise grossly clear. Note is made of mild-to-moderately elevated right hemidiaphragm. Stable cardio-mediastinal silhouette. No acute osseous abnormalities. The soft tissues are within normal limits. IMPRESSION: 1. No discrete pneumothorax seen. Redemonstration of right-sided pleural drainage catheter. 2. Small right pleural effusion and probable new small left pleural effusion. Electronically Signed   By: Ree Molt M.D.   On: 09/05/2024 10:12   DG CHEST PORT 1 VIEW Result Date: 09/04/2024 EXAM: 1 VIEW(S) XRAY OF THE CHEST 09/04/2024 10:50:38 AM COMPARISON: 09/03/2024 CLINICAL HISTORY: Pneumothorax. Right chest tube; rover FINDINGS: Right-sided chest tube is unchanged in position. mild cardiomegaly. No pneumothorax. Possible trace right pleural effusion. Subpleural interstitial thickening suggests  underlying interstitial lung disease. No superimposed lobar consolidation. IMPRESSION: 1. Right chest tube remaining in place, without pneumothorax. 2. Interstitial lung disease, as before. Electronically signed by: Rockey Kilts MD 09/04/2024 03:10 PM EDT RP Workstation: [id number]   DG CHEST PORT 1 VIEW Result Date: 09/03/2024 CLINICAL DATA:  Follow up pneumothorax. EXAM: PORTABLE CHEST 1 VIEW COMPARISON:  Radiographs 09/02/2024 and 09/01/2024.  CT 09/02/2024. FINDINGS: 1338 hours. Small caliber right pleural drainage catheter is unchanged from the most recent radiographs. No definite residual right-sided pneumothorax identified. Stable underlying chronic pulmonary fibrotic changes without superimposed airspace disease or significant pleural effusion. The heart size and mediastinal contours are stable. No acute osseous findings are seen. IMPRESSION: 1. No definite residual right-sided pneumothorax identified. Stable right pleural drainage catheter. 2. Stable chronic pulmonary  fibrotic changes. Electronically Signed   By: Elsie Perone M.D.   On: 09/03/2024 18:47   CT CHEST WO CONTRAST Result Date: 09/02/2024 EXAM: CT CHEST WITHOUT CONTRAST 09/02/2024 09:41:20 AM TECHNIQUE: CT of the chest was performed without the administration of intravenous contrast. Multiplanar reformatted images are provided for review. Automated exposure control, iterative reconstruction, and/or weight based adjustment of the mA/kV was utilized to reduce the radiation dose to as low as reasonably achievable. COMPARISON: CT angio chest 08/22/2024. CLINICAL HISTORY: Persistent pneumothorax. FINDINGS: MEDIASTINUM: Normal heart size. No pericardial effusion. Coronary artery calcifications. Aortic atherosclerosis. Central airways are patent. A few small foci of pneumomediastinum noted along the right inferior heart border (axial image 98/2). LYMPH NODES: Prominent mediastinal lymph nodes, nonpathologically enlarged by size criteria,  measuring up to 1.4 cm. Unchanged from previous exam. This is a nonspecific finding in the setting of chronic interstitial lung disease and likely reactive. Hilar lymph nodes are suboptimally visualized due to lack of IV contrast material. No axillary lymphadenopathy. LUNGS AND PLEURA: Right chest wall pigtail thoracostomy tube is identified, which enters from a ventral chest wall approach. The pigtail terminates over the anterior aspect of the inferior right upper lobe. Persistent right pneumothorax is identified which appears mild-to-moderate in volume. Signs of chronic interstitial lung disease are again noted with diffuse peripheral predominant interstitial reticulation and basilar subpleural honeycombing. Traction bronchiectasis is noted. Unchanged large thin-walled cyst in the superior segment of the right lower lobe. No focal consolidation or pulmonary edema. 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 No acute abnormality of the bones. UPPER ABDOMEN: Limited images of the upper abdomen demonstrate calcifications within the pancreas, compatible with chronic pancreatitis. No acute abnormality within the imaged portions of the upper abdomen. IMPRESSION: 1. Persistent right pneumothorax, mild-to-moderate in volume, with right chest wall pigtail thoracostomy tube in place. 2. A few small foci of pneumomediastinum along the right inferior heart border. 3. Signs of chronic interstitial lung disease compatible with usual interstitial pneumonia as characterized by previous high-resolution CT of the chest. 4. Aortic atherosclerotic calcification and coronary artery calcification. Electronically signed by: Waddell Calk MD 09/02/2024 10:04 AM EDT RP Workstation: [id number]   DG CHEST PORT 1 VIEW Result Date: 09/02/2024 CLINICAL DATA:  [id number] Pneumothorax [id number]. EXAM: PORTABLE CHEST 1 VIEW COMPARISON:  09/01/2024. FINDINGS:  Continued interval decrease in the right apicolateral pneumothorax when compared to the prior radiograph. There are atelectatic changes at the lung bases. Redemonstration of right-sided pleural drainage catheter which appears in different position. Bilateral lung fields are otherwise clear. No acute consolidation or lung collapse. No left pneumothorax. Bilateral costophrenic angles are clear. Stable cardio-mediastinal silhouette. No acute osseous abnormalities. The soft tissues are within normal limits. IMPRESSION: Continued interval decrease in the right apicolateral pneumothorax when compared to the prior radiograph. Electronically Signed   By: Ree Molt M.D.   On: 09/02/2024 09:26   DG CHEST PORT 1 VIEW Result Date: 09/01/2024 CLINICAL DATA:  [id number] Pneumothorax [id number]. EXAM: PORTABLE CHEST 1 VIEW COMPARISON:  09/01/2024, 8:06 a.m. FINDINGS: Low lung volume. Redemonstration of mild-to-moderate coarse bronchovascular markings, similar to the prior study. Right-sided pleural drainage catheter noted. There is small right apicolateral pneumothorax, oral slightly decreased since the prior study. No mediastinal shift. Atelectatic changes noted at the lung bases. Bilateral lung fields are otherwise grossly clear. No acute consolidation or lung collapse. Bilateral costophrenic angles are clear. Stable cardio-mediastinal silhouette. No acute osseous abnormalities. The soft tissues  are within normal limits. IMPRESSION: *Small right apicolateral pneumothorax, slightly decreased since the prior study. No mediastinal shift. Electronically Signed   By: Ree Molt M.D.   On: 09/01/2024 11:22   DG CHEST PORT 1 VIEW Result Date: 09/01/2024 CLINICAL DATA:  Pneumothorax. EXAM: PORTABLE CHEST 1 VIEW COMPARISON:  August 31, 2024 FINDINGS: There is stable right-sided chest tube positioning. A small to moderate size right-sided pneumothorax is seen. This measures 2.2 cm at the apex and is increased in size when  compared to the prior. Heart size and mediastinal contours are within normal limits. Stable bilateral airspace opacities are noted. No pleural effusion is seen. The visualized skeletal structures are unremarkable. IMPRESSION: 1. Small to moderate size right-sided pneumothorax, increased in size when compared to the prior study. 2. Stable bilateral airspace opacities. Electronically Signed   By: Suzen Dials M.D.   On: 09/01/2024 10:21   DG CHEST PORT 1 VIEW Result Date: 08/31/2024 CLINICAL DATA:  Pneumothorax. EXAM: PORTABLE CHEST 1 VIEW COMPARISON:  August 30, 2024. FINDINGS: Stable cardiomediastinal silhouette. Stable bilateral lung opacities are noted. Right-sided chest tube is again noted. Right-sided pneumothorax is significantly smaller, although small apical component remains. Bony thorax is unremarkable. IMPRESSION: Right-sided pneumothorax is significantly smaller, although small apical component remains. Electronically Signed   By: Lynwood Landy Raddle M.D.   On: 08/31/2024 08:33   DG CHEST PORT 1 VIEW Result Date: 08/30/2024 CLINICAL DATA:  Follow-up pneumothorax increased shortness of breath EXAM: PORTABLE CHEST 1 VIEW COMPARISON:  The same day. FINDINGS: Pigtail catheter is again noted on the right. Right-sided pneumothorax is again seen slightly increased when compared with the prior exam. Airspace opacities are again seen bilaterally stable from the prior exam. No new focal abnormality is noted. IMPRESSION: Slight increase in right-sided pneumothorax. Pigtail catheter is noted in place. Electronically Signed   By: Oneil Devonshire M.D.   On: 08/30/2024 23:40   DG CHEST PORT 1 VIEW Result Date: 08/30/2024 CLINICAL DATA:  Pneumothorax. EXAM: PORTABLE CHEST 1 VIEW COMPARISON:  08/30/2024 FINDINGS: Shallow inspiration. Cardiac enlargement. Bilateral pulmonary infiltrates are increasing since prior study. Small right pneumothorax with right chest tube in place. Pneumothorax is increased in size since  prior study, now measuring about 10 mm depth compared to 5 mm previously. 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 Calcification of the aorta. Degenerative changes in the spine. IMPRESSION: 1. Cardiac enlargement. Increasing pulmonary infiltrates bilaterally. 2. Small but increasing size of right pneumothorax with right chest tube in place. Electronically Signed   By: Elsie Gravely M.D.   On: 08/30/2024 21:30   DG CHEST PORT 1 VIEW Result Date: 08/30/2024 CLINICAL DATA:  Chest tube placement. EXAM: PORTABLE CHEST 1 VIEW COMPARISON:  Same day. FINDINGS: Right-sided chest tube is unchanged. Small right pneumothorax is again noted. IMPRESSION: Stable small right pneumothorax. Electronically Signed   By: Lynwood Landy Raddle M.D.   On: 08/30/2024 12:27   DG Chest Port 1 View Result Date: 08/30/2024 EXAM: 1 VIEW(S) XRAY OF THE CHEST 08/30/2024 07:07:00 AM COMPARISON: 08/29/2024 CLINICAL HISTORY: Chest tube in place [id number]. Right chest tube; rover. FINDINGS: LINES, TUBES AND DEVICES: Right pigtail catheter in place in mid right hemithorax. LUNGS AND PLEURA: Stable trace right apical pneumothorax. Mildly decreased lung volumes. Stable moderate lower lung interstitial thickening. No focal pulmonary opacity. No pulmonary edema. No pleural effusion. HEART AND MEDIASTINUM: No acute abnormality of the cardiac and mediastinal silhouettes. BONES AND SOFT TISSUES: No acute osseous abnormality. IMPRESSION: 1. Stable  trace right apical pneumothorax. 2. Right pigtail catheter in place in the mid right hemithorax. Electronically signed by: Waddell Calk MD 08/30/2024 07:26 AM EDT RP Workstation: [id number]   DG Chest Portable 1 View Result Date: 08/29/2024 CLINICAL DATA:  Chest tube insertion. EXAM: PORTABLE CHEST 1 VIEW COMPARISON:  08/29/2024 at 1222 FINDINGS: Right chest tube is coiled in the lateral aspect of the right chest. Right pneumothorax has completely resolved. Again noted are prominent  interstitial markings in both lungs compatible with chronic changes. Slightly decreased lung volumes. The trachea is midline. Heart size is normal limits. IMPRESSION: 1. Right chest tube in place.  Right pneumothorax has resolved. 2. Chronic lung changes. Electronically Signed   By: Juliene Balder M.D.   On: 08/29/2024 15:29   DG Chest Port 1 View Result Date: 08/29/2024 CLINICAL DATA:  Chest tube removal. EXAM: PORTABLE CHEST 1 VIEW COMPARISON:  08/29/2024 at 1222 hours. FINDINGS: Trachea is midline. Heart size stable. Right pneumothorax is increased in size, right pneumothorax has increased, now moderate in size, status post chest tube removal. Coarsened interstitial markings, better evaluated on CT chest 12/06/2023. IMPRESSION: 1. Moderate right pneumothorax, increased from earlier today, status post right chest tube removal. 2. Coarsened pulmonary markings, indicative of interstitial lung disease, characterized as usual interstitial pneumonitis on 12/06/2023 CT chest. Electronically Signed   By: Newell Eke M.D.   On: 08/29/2024 14:50   DG Chest Portable 1 View Result Date: 08/29/2024 CLINICAL DATA:  Chest tube insertion. EXAM: PORTABLE CHEST 1 VIEW COMPARISON:  08/29/2024 FINDINGS: Pigtail right chest tube has been placed. The tube is reconstituted along the lateral aspect of the right chest. The right pneumothorax has significantly decreased in size. Residual 10% pneumothorax. Patchy densities in the right lung and densities at the left lung base most likely related to atelectasis. Patient also has chronic lung changes. Trachea is midline. Heart size is grossly stable. IMPRESSION: 1. Placement of right chest tube with significant decrease in the right pneumothorax. Residual 10% right pneumothorax. 2. Patchy densities in the right lung and left lung base. Findings are most compatible with atelectasis. Electronically Signed   By: Juliene Balder M.D.   On: 08/29/2024 13:04   DG Chest 2 View Result Date:  08/29/2024 CLINICAL DATA:  Shortness of breath. Decreased oxygen saturations. History of pulmonary fibrosis. EXAM: CHEST - 2 VIEW COMPARISON:  08/22/2024 FINDINGS: Patient has developed a large right pneumothorax measuring greater than 50%. Significant volume loss in the right lung. Prominent interstitial densities in left lung compatible with chronic interstitial lung disease and volume loss. Trachea remains midline. Cardiac silhouette is grossly stable but poorly characterized on this examination. No large pleural effusion. Bony thorax appears intact. No obvious displaced rib fracture. IMPRESSION: 1. New large right pneumothorax. 2. Chronic interstitial lung disease. These results were called by telephone at the time of interpretation on 08/29/2024 at 10:51 am to provider [facility] , who verbally acknowledged these results. Electronically Signed   By: Juliene Balder M.D.   On: 08/29/2024 10:53   CT Angio Chest PE W and/or Wo Contrast Result Date: 08/22/2024 CLINICAL DATA:  Acute right-sided chest pain. EXAM: CT ANGIOGRAPHY CHEST WITH CONTRAST TECHNIQUE: Multidetector CT imaging of the chest was performed using the standard protocol during bolus administration of intravenous contrast. Multiplanar CT image reconstructions and MIPs were obtained to evaluate the vascular anatomy. RADIATION DOSE REDUCTION: This exam was performed according to the departmental dose-optimization program which includes automated exposure control, adjustment of the mA and/or kV  according to patient size and/or use of iterative reconstruction technique. CONTRAST:  75mL OMNIPAQUE  IOHEXOL  350 MG/ML SOLN COMPARISON:  June 05, 2024. FINDINGS: Cardiovascular: Satisfactory opacification of the pulmonary arteries to the segmental level. No evidence of pulmonary embolism. Normal heart size. No pericardial effusion. Coronary artery calcifications are noted suggesting coronary artery disease. Atherosclerosis of thoracic aorta is noted without  aneurysm or dissection. Mediastinum/Nodes: Stable mildly enlarged mediastinal adenopathy is noted which most likely is reactive in etiology. Thyroid gland is unremarkable. Esophagus is unremarkable. Lungs/Pleura: No pneumothorax or pleural effusion is noted. 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 This includes large bulla formation in right lower lobe. Upper Abdomen: No acute abnormality. Musculoskeletal: No chest wall abnormality. No acute or significant osseous findings. Review of the MIP images confirms the above findings. IMPRESSION: 1. No definite evidence of pulmonary embolus. 2. Coronary artery calcifications are noted suggesting coronary artery disease. 3. 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 4. Aortic atherosclerosis. Aortic Atherosclerosis (ICD10-I70.0). Electronically Signed   By: Lynwood Landy Raddle M.D.   On: 08/22/2024 08:21   DG Chest 2 View Result Date: 08/22/2024 CLINICAL DATA:  Chest pain EXAM: CHEST - 2 VIEW COMPARISON:  01/31/2022 FINDINGS: Low lung volumes. The cardio pericardial silhouette is enlarged. Vascular congestion noted with diffuse interstitial opacity, compatible with the patient's known underlying chronic interstitial lung disease. Component of superimposed pulmonary edema not excluded. Dominant air cyst in the lateral right lung similar to CT scan of 06/05/2024. small right pleural effusion suspected. No worrisome lytic or sclerotic osseous abnormality. IMPRESSION: Low volume film with vascular congestion and diffuse interstitial opacity, compatible with the patient's known underlying chronic interstitial lung disease. Component of superimposed pulmonary edema not excluded. Electronically Signed   By: Camellia Candle M.D.   On: 08/22/2024 05:08     Microbiology: Results for orders placed or performed during the hospital encounter of 08/29/24  Resp panel by RT-PCR (RSV, Flu A&B, Covid) Anterior Nasal Swab     Status: None   Collection Time: 08/29/24 12:48 PM   Specimen: Anterior Nasal Swab  Result Value Ref Range Status   SARS Coronavirus 2 by RT PCR NEGATIVE NEGATIVE Final   Influenza A by PCR NEGATIVE NEGATIVE Final   Influenza B by PCR NEGATIVE NEGATIVE Final    Comment: (NOTE) The Xpert Xpress SARS-CoV-2/FLU/RSV plus assay is intended as an aid in the diagnosis of influenza from Nasopharyngeal swab specimens and should not be used as a sole basis for treatment. Nasal washings and aspirates are unacceptable for Xpert Xpress SARS-CoV-2/FLU/RSV testing.  Fact Sheet for Patients: BloggerCourse.com  Fact Sheet for Healthcare Providers: SeriousBroker.it  This test is not yet approved or cleared by the [place]  FDA and has been authorized for detection and/or diagnosis of SARS-CoV-2 by FDA under an Emergency Use Authorization (EUA). This EUA will remain in effect (meaning this test can be used) for the duration of the COVID-19 declaration under Section 564(b)(1) of the Act, 21 U.S.C. section 360bbb-3(b)(1), unless the authorization is terminated or revoked.     Resp Syncytial Virus by PCR NEGATIVE NEGATIVE Final    Comment: (NOTE) Fact Sheet for Patients: BloggerCourse.com  Fact Sheet for Healthcare Providers: SeriousBroker.it  This test is not yet approved or cleared by the [place]  FDA and has been authorized for detection and/or diagnosis of SARS-CoV-2  by FDA under an Emergency Use Authorization (EUA). This EUA will remain in effect (meaning this test can be used) for the duration of the COVID-19 declaration under Section 564(b)(1) of the Act, 21 U.S.C. section 360bbb-3(b)(1), unless the authorization is  terminated or revoked.  Performed at [facility], [address] [place], [place] [address]     Labs: CBC: Recent Labs  Lab 09/02/24 0228 09/03/24 0327 09/04/24 0231 09/05/24 0236 09/06/24 0309  WBC 14.4* 14.4* 12.5* 12.7* 12.4*  HGB 14.2 13.7 13.5 13.7 13.7  HCT 43.0 41.2 41.1 41.6 41.0  MCV 81.3 80.8 82.0 81.9 82.0  PLT 197 182 190 203 201   Basic Metabolic Panel: Recent Labs  Lab 09/02/24 0228 09/03/24 0327 09/04/24 0231 09/05/24 0236 09/06/24 0309  NA 129* 130* 132* 132* 131*  K 4.5 4.4 4.4 4.6 4.7  CL 93* 96* 96* 96* 96*  CO2 27 25 27 27 27   GLUCOSE 139* 143* 130* 128* 120*  BUN 9 14 10 10 9   CREATININE 0.63 0.53* 0.50* 0.53* 0.54*  CALCIUM  8.8* 8.6* 8.6* 8.7* 8.7*  MG 1.9 1.9 1.9 2.0 1.9   Liver Function Tests: No results for input(s): AST, ALT, ALKPHOS, BILITOT, PROT, ALBUMIN in the last 168 hours. CBG: No results for input(s): GLUCAP in the last 168 hours.  Discharge time spent: greater than 30 minutes.  Signed: Elidia Toribio Furnace, MD [facility] 09/07/2024

## 2024-09-07 NOTE — Care Management Important Message (Signed)
 Important Message  Patient Details  Name: Vincent Meyers MRN: [id number] Date of Birth: 28-May-1946   Important Message Given:  Yes - Medicare IM     Vincent Meyers 09/07/2024, 11:34 AM

## 2024-09-07 NOTE — Plan of Care (Signed)
  Problem: Education: Goal: Knowledge of General Education information will improve Description: Including pain rating scale, medication(s)/side effects and non-pharmacologic comfort measures 09/07/2024 1225 by Gail Cathryne SAILOR, RN Outcome: Adequate for Discharge 09/07/2024 1224 by Gail Cathryne SAILOR, RN Outcome: Adequate for Discharge   Problem: Health Behavior/Discharge Planning: Goal: Ability to manage health-related needs will improve 09/07/2024 1225 by Gail Cathryne SAILOR, RN Outcome: Adequate for Discharge 09/07/2024 1224 by Gail Cathryne SAILOR, RN Outcome: Adequate for Discharge   Problem: Clinical Measurements: Goal: Ability to maintain clinical measurements within normal limits will improve 09/07/2024 1225 by Gail Cathryne SAILOR, RN Outcome: Adequate for Discharge 09/07/2024 1224 by Gail Cathryne SAILOR, RN Outcome: Adequate for Discharge Goal: Will remain free from infection 09/07/2024 1225 by Gail Cathryne SAILOR, RN Outcome: Adequate for Discharge 09/07/2024 1224 by Gail Cathryne SAILOR, RN Outcome: Adequate for Discharge Goal: Diagnostic test results will improve 09/07/2024 1225 by Gail Cathryne SAILOR, RN Outcome: Adequate for Discharge 09/07/2024 1224 by Gail Cathryne SAILOR, RN Outcome: Adequate for Discharge Goal: Respiratory complications will improve 09/07/2024 1225 by Gail Cathryne SAILOR, RN Outcome: Adequate for Discharge 09/07/2024 1224 by Gail Cathryne SAILOR, RN Outcome: Adequate for Discharge Goal: Cardiovascular complication will be avoided 09/07/2024 1225 by Gail Cathryne SAILOR, RN Outcome: Adequate for Discharge 09/07/2024 1224 by Gail Cathryne SAILOR, RN Outcome: Adequate for Discharge   Problem: Activity: Goal: Risk for activity intolerance will decrease 09/07/2024 1225 by Gail Cathryne SAILOR, RN Outcome: Adequate for Discharge 09/07/2024 1224 by Gail Cathryne SAILOR, RN Outcome: Adequate for Discharge   Problem: Nutrition: Goal: Adequate nutrition will be maintained 09/07/2024 1225 by  Gail Cathryne SAILOR, RN Outcome: Adequate for Discharge 09/07/2024 1224 by Gail Cathryne SAILOR, RN Outcome: Adequate for Discharge   Problem: Coping: Goal: Level of anxiety will decrease 09/07/2024 1225 by Gail Cathryne SAILOR, RN Outcome: Adequate for Discharge 09/07/2024 1224 by Gail Cathryne SAILOR, RN Outcome: Adequate for Discharge   Problem: Elimination: Goal: Will not experience complications related to bowel motility 09/07/2024 1225 by Gail Cathryne SAILOR, RN Outcome: Adequate for Discharge 09/07/2024 1224 by Gail Cathryne SAILOR, RN Outcome: Adequate for Discharge Goal: Will not experience complications related to urinary retention 09/07/2024 1225 by Gail Cathryne SAILOR, RN Outcome: Adequate for Discharge 09/07/2024 1224 by Gail Cathryne SAILOR, RN Outcome: Adequate for Discharge   Problem: Pain Managment: Goal: General experience of comfort will improve and/or be controlled 09/07/2024 1225 by Gail Cathryne SAILOR, RN Outcome: Adequate for Discharge 09/07/2024 1224 by Gail Cathryne SAILOR, RN Outcome: Adequate for Discharge   Problem: Safety: Goal: Ability to remain free from injury will improve 09/07/2024 1225 by Gail Cathryne SAILOR, RN Outcome: Adequate for Discharge 09/07/2024 1224 by Gail Cathryne SAILOR, RN Outcome: Adequate for Discharge

## 2024-09-07 NOTE — Plan of Care (Signed)
  Problem: Education: Goal: Knowledge of General Education information will improve Description: Including pain rating scale, medication(s)/side effects and non-pharmacologic comfort measures Outcome: Adequate for Discharge   Problem: Health Behavior/Discharge Planning: Goal: Ability to manage health-related needs will improve Outcome: Adequate for Discharge   Problem: Clinical Measurements: Goal: Ability to maintain clinical measurements within normal limits will improve Outcome: Adequate for Discharge Goal: Will remain free from infection Outcome: Adequate for Discharge Goal: Diagnostic test results will improve Outcome: Adequate for Discharge Goal: Respiratory complications will improve Outcome: Adequate for Discharge Goal: Cardiovascular complication will be avoided Outcome: Adequate for Discharge   Problem: Activity: Goal: Risk for activity intolerance will decrease Outcome: Adequate for Discharge   Problem: Nutrition: Goal: Adequate nutrition will be maintained Outcome: Adequate for Discharge   Problem: Coping: Goal: Level of anxiety will decrease Outcome: Adequate for Discharge   Problem: Elimination: Goal: Will not experience complications related to bowel motility Outcome: Adequate for Discharge Goal: Will not experience complications related to urinary retention Outcome: Adequate for Discharge   Problem: Pain Managment: Goal: General experience of comfort will improve and/or be controlled Outcome: Adequate for Discharge   Problem: Safety: Goal: Ability to remain free from injury will improve Outcome: Adequate for Discharge

## 2024-09-10 ENCOUNTER — Telehealth: Payer: Self-pay

## 2024-09-10 NOTE — Transitions of Care (Post Inpatient/ED Visit) (Signed)
   09/10/2024  Name: Vincent Meyers MRN: [id number] DOB: 1946-10-29  Today's TOC FU Call Status: Today's TOC FU Call Status:: Unsuccessful Call (1st Attempt) Unsuccessful Call (1st Attempt) Date: 09/10/24  Attempted to reach the patient regarding the most recent Inpatient/ED visit.  Follow Up Plan: Additional outreach attempts will be made to reach the patient to complete the Transitions of Care (Post Inpatient/ED visit) call.   Shona Prow RN, CCM [place]  VBCI-Population Health RN Care Manager [phone]

## 2024-09-11 ENCOUNTER — Telehealth: Payer: Self-pay

## 2024-09-11 NOTE — Transitions of Care (Post Inpatient/ED Visit) (Signed)
   09/11/2024  Name: Vincent Meyers MRN: [id number] DOB: 1946/05/22  Today's TOC FU Call Status: Today's TOC FU Call Status:: Unsuccessful Call (2nd Attempt) Unsuccessful Call (2nd Attempt) Date: 09/11/24  Attempted to reach the patient regarding the most recent Inpatient/ED visit.  Follow Up Plan: Additional outreach attempts will be made to reach the patient to complete the Transitions of Care (Post Inpatient/ED visit) call.   Shona Prow RN, CCM [place]  VBCI-Population Health RN Care Manager [phone]

## 2024-09-11 NOTE — Transitions of Care (Post Inpatient/ED Visit) (Signed)
   09/11/2024  Name: Vincent Meyers MRN: [id number] DOB: 1946/10/22  Today's TOC FU Call Status: Today's TOC FU Call Status:: Successful TOC FU Call Completed TOC FU Call Complete Date: 09/11/24 [facility] RN received an inbound call from patient returning TOC RN's call - TOC RN explained program and patient declined and said he already has MD appointments, has his medicattions and understands them and does not feel he needs to review everything again) Patient's Name and Date of Birth confirmed.  Shona Prow RN, CCM [place]  VBCI-Population Health RN Care Manager [phone]

## 2024-09-17 NOTE — Progress Notes (Signed)
 TITLE: A Phase 2, Randomized, Double-Blind, Placebo-Controlled Study to Evaluate the Safety and Efficacy of [id number] in Patients With Idiopathic Pulmonary Fibrosis  Protocol #: [id number] NCT: Sponsor:[employer]:  This is a randomized, double-blinded, placebo-controlled multicenter study to evaluate the safety and efficacy of [id number] in patients with IPF with or without standard-of-care. 2:1 randomization ratio to [id number] 150 mg BID or the matching placebo for  24 weeks.    Mechanism of Action Proline is one of the largest constituents of collagen. In IPF, there is excessive deposition of collagen. Prolyl-tRNA Synthetase (PRS) is an enzyme that conjugates proline. [id number] (Bersiporocin) is the world's first selective PRS inhibitor that decreases collagen formation and subsequent pro-fibrotic markers.  Administration  A dose of [id number] 150 mg BID will be administered orally in the fasted state or at least 2 hours after the last meal, for 24 weeks.   Key Inclusion Criteria age = 40 years Documented diagnosis of IPF per the 2018 ATS/ERS/JRS/ALAT Clinical Practice Meeting all of the following criteria during the screening period:             FVC >= 40% predicted            DLCOcor >=25% to <= 80%              (FEV1)/FVC ratio >= 0.7             Able to walk at least 150 m in , resting SpO2 should be >= 88% with a maximum of 6L O2/min  On a stable dose of pirfenidone  OR nintedanib for at least 3 months OR on neither pirfenidone  nor nintedanib.  Key Exclusion Criteria Currently taking medication known as a strong CYP2D6 inhibitor OR taking medication known to be CYP2D6 inducers OR CYP2D6 substrate with narrow therapeutic index. GFR  < 30 mL/min/1.25m2 moderate to severe hepatic impairment (Child-Pugh B and C). Patients with =3upper limit of normal of alanine aminotransferase, aspartate aminotransferase or gamma-glutamyl transpeptidase. Abnormal  ECG findings including but not limited to QTc >500 ms.  Pharmacokinetics Urine PK data indicate that renal elimination is not the major clearance pathway for [id number].   Adverse effects and risk Overall, when ITW87911 was administered concomitantly with Pirfenidone  and Nintedanib, [id number] 150 mg enteric-coated tablet was generally well tolerated and safe.   Safety data from edition number [id number], abstracted in March 2023.   Gastrointestinal adverse reactions (e.g Diarrhea, abdominal pain, nausea, vomiting) followed by CNS (headache and dizziness) as below were the most commonly observed adverse reactions.  Most adverse reactions were mild or moderate and reversible.  An improved enteric-coated 150 mg tablet (new) was reformulated to lower the initial dissolution rate in pH 6.8. Enteric-coated tablets (new) demonstrated that nausea and vomiting appear to have decreased. While the incidence rate of diarrhea was slightly increased than old formulation administration, the severity was all mild.  Below table comprises predominantly of enteric coated tablet.  Overall adverse event % n=229 Average of 4 different studies [id number] n=24(part 1) Esbriet  combination [id number] n=24(part 2) Ofev combination   Nausea 54 out of 229 23.58% 6(25%) 2(8.33%)  Vomiting 37 out of 205 18.04% 1(4.17%) 1(4.17%)  Diarrhea 37 out of 229 16.15% 2(8.33%) 7(29.17%)  Abdominal pain 7 out of 229 3.05% 0 1(4.17%)  Constipation 4 out of 54  7.4% - -  Abdominal discomfort 4 out of 205  1.95% 0 1(4.17%)  Headache 25 out of 205  12.19% 4(16.67%) 1(4.17%)  Dizziness 8 out of 175 (  4.57%) 3(12.50%) 1(4.17%)   Severity of TEAE Table  Overall adverse event % n=229 Average of 4 different studies [id number] n=24(part 1) Esbriet  combination [id number] n=24(part 2) Ofev combination   TEAE - occurrences  382 19 18  Severity- mild 351 (91.88%) 18 (94.73%) 17 (94.44%)   Moderate 31 (8.11%) 1 (5.26%) 1  (5.55%)  Severe 0 (0%) 0 (0%)  0 (0%)     91% TEAEs were mild  (96 of 106 TEAEs, including all 16 TEAEs in the placebo group). 9% (n=10) were moderate.   TEAEs occurred in a dose dependent manner with 7 of the 10 occurring in daily doses >= 600mg   All moderate TEAEs were classed as Gastrointestinal Disorders (nausea, vomiting, diarrhea, and epigastric discomfort), were considered recovered/resolved within 24 hours, and had no sequelae. There were no severe, life-threatening or fatal TEAEs across the study. There were no subjects with serious TEAEs, and there were no subjects with TEAEs leading to IP discontinuation during Part 1 (SAD) of the study.  EKG concerns There were no effects on ECG with dose up to 80 mg/kg in cynomolgus monkeys. In humans no EKG abnormalities reported  except in Part 2 ([id number] administered concomitantly with Nintedanib), one case of PR prolongation LFT concerns In rats, reversible, minimal DWN12088HCl-related centrilobular hepatocyte hypertrophy was noted in the liver administered >=50 mg/kg/day and was considered consistent with DWN12088HCl-related induction of hepatocellular enzymes.  In humans, on investigation in the MAD study of six subjects, hepatic enzyme was increased in one participant (16.7%).   Rare concerns based on animal data - not seen in humans Excessive salivation in rats  at doses >50 mg/kg/day and mildly diminished appetite in 1 monkey was observed on one occasion.  With extremely high doses of 1200 mg/kg decreased activity, increased/labored/shallow respiration, gasping, vocalizing and piloerection was observed in male (but not male) rats 6h post dose.    Xxxxxxxx   LATE ENTRY - PI OVERSIGHT ATTESTATION  I the Principal Investigator (PI) for the above mentioned study attest that I reviewed the mentioned  clinical research coordinator  notes on research subject  Vincent Meyers  born 10-02-1946 . I  agree with the findings mentioned  above   Dr.Rachana Malesky Geronimo, MD Pulmonary and Critical Care Medicine Research Investigator & Staff Physician [facility] and [facility]  [place] [facility] and Critical [facility] Pager: [phone], If no answer or between  15:00h - 7:00h: call [phone]  09/17/2024 2:09 PM

## 2024-09-17 NOTE — Progress Notes (Signed)
 TITLE: A Phase 2, Randomized, Double-Blind, Placebo-Controlled Study to Evaluate the Safety and Efficacy of [id number] in Patients With Idiopathic Pulmonary Fibrosis  Protocol #: [id number] NCT: Sponsor:[employer]:  This is a randomized, double-blinded, placebo-controlled multicenter study to evaluate the safety and efficacy of ITW87911 in patients with IPF with or without standard-of-care. 2:1 randomization ratio to [id number] 150 mg BID or the matching placebo for  24 weeks.    Mechanism of Action Proline is one of the largest constituents of collagen. In IPF, there is excessive deposition of collagen. Prolyl-tRNA Synthetase (PRS) is an enzyme that conjugates proline. [id number] (Bersiporocin) is the world's first selective PRS inhibitor that decreases collagen formation and subsequent pro-fibrotic markers.  Administration  A dose of [id number] 150 mg BID will be administered orally in the fasted state or at least 2 hours after the last meal, for 24 weeks.   Key Inclusion Criteria age = 40 years Documented diagnosis of IPF per the 2018 ATS/ERS/JRS/ALAT Clinical Practice Meeting all of the following criteria during the screening period:             FVC >= 40% predicted            DLCOcor >=25% to <= 80%              (FEV1)/FVC ratio >= 0.7             Able to walk at least 150 m in , resting SpO2 should be >= 88% with a maximum of 6L O2/min  On a stable dose of pirfenidone  OR nintedanib for at least 3 months OR on neither pirfenidone  nor nintedanib.  Key Exclusion Criteria Currently taking medication known as a strong CYP2D6 inhibitor OR taking medication known to be CYP2D6 inducers OR CYP2D6 substrate with narrow therapeutic index. GFR  < 30 mL/min/1.36m2 moderate to severe hepatic impairment (Child-Pugh B and C). Patients with =3upper limit of normal of alanine aminotransferase, aspartate aminotransferase or gamma-glutamyl  transpeptidase. Abnormal ECG findings including but not limited to QTc >500 ms.  Pharmacokinetics Urine PK data indicate that renal elimination is not the major clearance pathway for ITW87911.   Adverse effects and risk Overall, when ITW87911 was administered concomitantly with Pirfenidone  and Nintedanib, [id number] 150 mg enteric-coated tablet was generally well tolerated and safe.   Safety data from edition number [id number], abstracted in March 2023.   Gastrointestinal adverse reactions (e.g Diarrhea, abdominal pain, nausea, vomiting) followed by CNS (headache and dizziness) as below were the most commonly observed adverse reactions.  Most adverse reactions were mild or moderate and reversible.  An improved enteric-coated 150 mg tablet (new) was reformulated to lower the initial dissolution rate in pH 6.8. Enteric-coated tablets (new) demonstrated that nausea and vomiting appear to have decreased. While the incidence rate of diarrhea was slightly increased than old formulation administration, the severity was all mild.  Below table comprises predominantly of enteric coated tablet.  Overall adverse event % n=229 Average of 4 different studies [id number] n=24(part 1) Esbriet  combination [id number] n=24(part 2) Ofev combination   Nausea 54 out of 229 23.58% 6(25%) 2(8.33%)  Vomiting 37 out of 205 18.04% 1(4.17%) 1(4.17%)  Diarrhea 37 out of 229 16.15% 2(8.33%) 7(29.17%)  Abdominal pain 7 out of 229 3.05% 0 1(4.17%)  Constipation 4 out of 54  7.4% - -  Abdominal discomfort 4 out of 205  1.95% 0 1(4.17%)  Headache 25 out of 205  12.19% 4(16.67%) 1(4.17%)  Dizziness 8 out of  175 (4.57%) 3(12.50%) 1(4.17%)   Severity of TEAE Table  Overall adverse event % n=229 Average of 4 different studies [id number] n=24(part 1) Esbriet  combination [id number] n=24(part 2) Ofev combination   TEAE - occurrences  382 19 18  Severity- mild 351 (91.88%) 18 (94.73%) 17 (94.44%)    Moderate 31 (8.11%) 1 (5.26%) 1 (5.55%)  Severe 0 (0%) 0 (0%)  0 (0%)     91% TEAEs were mild  (96 of 106 TEAEs, including all 16 TEAEs in the placebo group). 9% (n=10) were moderate.   TEAEs occurred in a dose dependent manner with 7 of the 10 occurring in daily doses >= 600mg   All moderate TEAEs were classed as Gastrointestinal Disorders (nausea, vomiting, diarrhea, and epigastric discomfort), were considered recovered/resolved within 24 hours, and had no sequelae. There were no severe, life-threatening or fatal TEAEs across the study. There were no subjects with serious TEAEs, and there were no subjects with TEAEs leading to IP discontinuation during Part 1 (SAD) of the study.  EKG concerns There were no effects on ECG with dose up to 80 mg/kg in cynomolgus monkeys. In humans no EKG abnormalities reported  except in Part 2 ([id number] administered concomitantly with Nintedanib), one case of PR prolongation LFT concerns In rats, reversible, minimal DWN12088HCl-related centrilobular hepatocyte hypertrophy was noted in the liver administered >=50 mg/kg/day and was considered consistent with DWN12088HCl-related induction of hepatocellular enzymes.  In humans, on investigation in the MAD study of six subjects, hepatic enzyme was increased in one participant (16.7%).   Rare concerns based on animal data - not seen in humans Excessive salivation in rats  at doses >50 mg/kg/day and mildly diminished appetite in 1 monkey was observed on one occasion.  With extremely high doses of 1200 mg/kg decreased activity, increased/labored/shallow respiration, gasping, vocalizing and piloerection was observed in male (but not male) rats 6h post dose.   Xxxxxxxx   LATE ENTRY - PI OVERSIGHT ATTESTATION  I the Principal Investigator (PI) for the above mentioned study attest that I reviewed the mentioned  clinical research coordinator  notes on research subject  SIONE BAUMGARTEN  born Nov 15, 1946 . I  agree  with the findings mentioned above   Dr.Maymie Brunke Geronimo, MD Pulmonary and Critical Care Medicine Research Investigator & Staff Physician [facility] Pulmonary and [facility]  [place] Pulmonary and Critical Care Pager: [phone], If no answer or between  15:00h - 7:00h: call [phone]  09/17/2024 2:07 PM

## 2024-09-24 ENCOUNTER — Other Ambulatory Visit: Payer: Self-pay

## 2024-09-25 DIAGNOSIS — L578 Other skin changes due to chronic exposure to nonionizing radiation: Secondary | ICD-10-CM | POA: Diagnosis not present

## 2024-09-25 DIAGNOSIS — L821 Other seborrheic keratosis: Secondary | ICD-10-CM | POA: Diagnosis not present

## 2024-09-25 DIAGNOSIS — L570 Actinic keratosis: Secondary | ICD-10-CM | POA: Diagnosis not present

## 2024-09-25 DIAGNOSIS — L301 Dyshidrosis [pompholyx]: Secondary | ICD-10-CM | POA: Diagnosis not present

## 2024-09-27 ENCOUNTER — Other Ambulatory Visit: Payer: Self-pay

## 2024-09-28 ENCOUNTER — Ambulatory Visit

## 2024-09-28 ENCOUNTER — Encounter: Payer: Self-pay | Admitting: Internal Medicine

## 2024-09-28 ENCOUNTER — Ambulatory Visit: Admitting: Internal Medicine

## 2024-09-28 VITALS — BP 134/76 | HR 77 | Temp 97.6°F | Ht 67.5 in | Wt 206.2 lb

## 2024-09-28 DIAGNOSIS — J849 Interstitial pulmonary disease, unspecified: Secondary | ICD-10-CM

## 2024-09-28 DIAGNOSIS — G4733 Obstructive sleep apnea (adult) (pediatric): Secondary | ICD-10-CM

## 2024-09-28 DIAGNOSIS — Z8709 Personal history of other diseases of the respiratory system: Secondary | ICD-10-CM

## 2024-09-28 DIAGNOSIS — J84112 Idiopathic pulmonary fibrosis: Secondary | ICD-10-CM

## 2024-09-28 DIAGNOSIS — J984 Other disorders of lung: Secondary | ICD-10-CM | POA: Diagnosis not present

## 2024-09-28 DIAGNOSIS — J439 Emphysema, unspecified: Secondary | ICD-10-CM | POA: Diagnosis not present

## 2024-09-28 DIAGNOSIS — J939 Pneumothorax, unspecified: Secondary | ICD-10-CM | POA: Diagnosis not present

## 2024-09-28 DIAGNOSIS — Z09 Encounter for follow-up examination after completed treatment for conditions other than malignant neoplasm: Secondary | ICD-10-CM

## 2024-09-28 NOTE — Progress Notes (Signed)
 OV 12/20/2022 -evaluation of ILD care.  Referred by Dr. Shellia from [facility] location.  Subjective:  Patient ID: Vincent Meyers, male , DOB: 1946-05-01 , age 78 y.o. , MRN: [id number] , ADDRESS: 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 Last CT scan was 2022. Echocardiogram was 11/11/2022. Pt is only on Albuterol  as needed.      HPI RANIER Meyers 78 y.o. -history is gained talking to the patient and review of the external record.  He tells me that his primary care physician show told him some 5 years ago that he had scarring in the lung.  Then 2 years ago he was in [place] and got hospitalized for unrelated reasons and was told he had pulmonary fibrosis.  After that he did see Dr. Shellia.  So he feels that he has had 5 years worth of insidious onset of shortness of breath that is slowly progressive.  More progressive in the last year or 2.  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      [facility] Comprehensive ILD Questionnaire  Symptoms:   Past Medical History :  - He does have sleep apnea and uses CPAP -sees Dr. Shellia -He has irritable bowel syndrome and diarrhea.  GI referral in [place] is pending. -Does have acid reflux disease - Has cardiac history not otherwise specified. -Has had COVID in May 2022. -Denies any asthma or COPD.  Has an albuterol  inhaler that does not help him.  ROS:  -Has fatigue and chronic pain particularly in the knees and shoulders. - Sometimes liquid just try to go down the windpipe and he chokes.  He  does have diarrhea with irritable bowel syndrome - He had dry mouth after COVID but he does not have that now. - Does have acid reflux  FAMILY HISTORY of LUNG DISEASE:  -Only he has pulmonary fibrosis but otherwise no lung disease in the family  PERSONAL EXPOSURE HISTORY:  -Smoke.  1967 1987.  Greater than 40 pack smoking history.  He has done some mild marijuana in the past.  Never used cocaine.  Never used intravenous drugs.  HOME  EXPOSURE and HOBBY DETAILS :  [place]  He is lived there for 5 years but the home is 75 years.  Previously lived in a 77 year old home.  He believes that might have been some mold or mildew in the shower curtain but is never seen there.  He does use a CPAP but there is no mold or mildew in it.  Detail organic and inorganic antigen exposure history in the house is negative.  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  He works with race chiropodist.  He goes to the shop every few days a week.  He does have a motor home and travel.  He has not been to [place].  No agent orange exposure but he does have some oil heater exposure.  There are some woodwork exposure.  Machinist exposure present.  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  He has done some welding on course.  PULMONARY TOXICITY HISTORY (27 items):  Denies  INVESTIGATIONS: -Last PFT was 1 year ago and compared to 2021 was relatively stable -Last CT scan of the chest December 2022 but this shows progression over the 2 years since 2020     Latest Reference Range & Units 06/02/21 12:11  Anti Nuclear Antibody (ANA) Negative  Negative  RA Latex Turbid. <14 IU/mL <14  Scleroderma (Scl-70) (ENA) Antibody, IgG <1.0 NEG AI <1.0 NEG   HRCT  - last Dec 2022   Narrative &  Impression  CLINICAL DATA:  78 year old male with history of pulmonary fibrosis. Follow-up study.   EXAM: CT CHEST WITHOUT CONTRAST   TECHNIQUE: Multidetector CT imaging of the chest was performed following the standard protocol without intravenous contrast. High resolution imaging of the lungs, as well as inspiratory and expiratory imaging, was performed.   COMPARISON:  Cardiac CT 07/24/2019.   FINDINGS: Cardiovascular: Heart size is normal. There is no significant pericardial fluid, thickening or pericardial calcification. There is aortic atherosclerosis, as well as atherosclerosis of the great vessels of the mediastinum and the coronary arteries, including calcified atherosclerotic plaque in the left main, left anterior descending, left circumflex and right coronary arteries.   Mediastinum/Nodes: No pathologically enlarged mediastinal or hilar lymph nodes. Multiple prominent but nonenlarged mediastinal and hilar lymph nodes are incidentally noted. Esophagus is unremarkable in appearance. No axillary lymphadenopathy.   Lungs/Pleura: High-resolution images demonstrate widespread but patchy areas of ground-glass attenuation, septal thickening, subpleural reticulation, parenchymal banding, traction bronchiectasis and frank honeycombing. Findings have a definitive craniocaudal gradient and are clearly progressive compared to the prior examination from 2020. Inspiratory and expiratory imaging is unremarkable. No acute consolidative airspace disease. No pleural effusions. No suspicious appearing pulmonary nodules or masses are confidently identified upon this background of fibrotic lung disease.   Upper Abdomen: Aortic atherosclerosis.   Musculoskeletal: There are no aggressive appearing lytic or blastic lesions noted in the visualized portions of the skeleton.   IMPRESSION: 1. The appearance of the lungs is considered diagnostic of usual interstitial pneumonia (UIP) per  current ATS guidelines, with clear progression compared to the prior examination, as discussed above. 2. Aortic atherosclerosis, in addition to left main and 3 vessel coronary artery disease. Please note that although the presence of coronary artery calcium  documents the presence of coronary artery disease, the severity of this disease and any potential stenosis cannot be assessed on this non-gated CT examination. Assessment for potential risk factor modification, dietary therapy or pharmacologic therapy may be warranted, if clinically indicated.   Aortic Atherosclerosis (ICD10-I70.0).     Electronically Signed   By: Toribio Aye M.D.   On: 10/31/2021 08:23   ECHO dec 2023    IMPRESSIONS     1. Left ventricular ejection fraction, by estimation, is 60 to 65%. The  left ventricle has normal function. The left ventricle has no regional  wall motion abnormalities. Left ventricular diastolic parameters are  consistent with Grade I diastolic  dysfunction (impaired relaxation). The average left ventricular global  longitudinal strain is -23.8 %. The global longitudinal strain is normal.   2. Right ventricular systolic function is normal. The right ventricular  size is normal.  3. The mitral valve is normal in structure. No evidence of mitral valve  regurgitation. No evidence of mitral stenosis.   4. The aortic valve is tricuspid. Aortic valve regurgitation is not  visualized. No aortic stenosis is present.   5. Aortic dilatation noted. There is mild dilatation of the aortic root,  measuring 37 mm. There is mild dilatation of the ascending aorta,  measuring 37 mm.   6. The inferior vena cava is normal in size with <50% respiratory  variability, suggesting right atrial pressure of 8 mmHg.    OV 02/01/2023  Subjective:  Patient ID: Vincent Meyers, male , DOB: 11-Mar-1946 , age 10 y.o. , MRN: [id number] , ADDRESS: [address] [place] [address] PCP de [place],  Raymond J, MD Patient Care Team: de [place], Quintin PARAS, MD as PCP - General (Family Medicine) Francyne Headland, MD as PCP - Cardiology (Cardiology)  This Provider for this visit: Treatment Team:  Attending Provider: Geronimo Amel, MD      02/01/2023 -   Chief Complaint  Patient presents with   Follow-up    PFT f/up     HPI KHARTER BREW 78 y.o. -presents for follow-up.  He is on week 2 of pirfenidone .  He is now on 6 pills 3 times daily.  He is already noticing that his diarrhea has gone up.  Otherwise symptoms are stable compared to January 2024.  He did a pulmonary function test and his FVC shows a 13% decline in the last 1 year.  Although the CT scan of the chest states there is no decline in the last 1 year but there is progression on even on the CT scan from 2020.  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  He is behaving like a classic IPF patient.  He says he does do some water walking at [facility] and he does not feel dyspneic but when he does activity he feels dyspneic.  He asked about taking an inhaler for his dyspnea.  I visualized the scan he does not have any emphysema.  Therefore we decided not to add any long-acting inhalers for him.  We discussed pulmonary rehabilitation and he is willing to do that.  Of note he does have chronic diarrhea.  He has seen Dr. Legrand for this at [facility].  He has upcoming visit with him.  He will have liver function test today.  He is open to the idea of clinical trials.        CT Chest data - HRCT 01/25/23  Narrative & Impression  CLINICAL DATA:  78 year old male former smoker (quit 35 years ago) with interstitial lung disease. Follow-up study.   EXAM: CT CHEST WITHOUT CONTRAST   TECHNIQUE: Multidetector CT imaging of the chest was performed following the standard protocol without intravenous contrast. High resolution imaging of the lungs, as well as inspiratory and expiratory imaging, was performed.   RADIATION DOSE  REDUCTION: This exam was performed according to the departmental dose-optimization program which includes automated exposure control, adjustment of the mA and/or kV according to patient size and/or use of iterative reconstruction technique.   COMPARISON:  High-resolution chest CT 10/30/2021.   FINDINGS: Cardiovascular: Heart size is normal. There is no significant pericardial fluid, thickening or pericardial calcification. There is aortic atherosclerosis, as well as atherosclerosis of the great vessels of the mediastinum and the coronary arteries, including calcified atherosclerotic plaque in the left main, left anterior descending, left circumflex and right coronary arteries.   Mediastinum/Nodes: Multiple  prominent borderline enlarged mediastinal and hilar lymph nodes, similar to the prior study, presumably chronic and benign in the setting of interstitial lung disease. Esophagus is unremarkable in appearance. No axillary lymphadenopathy.   Lungs/Pleura: High-resolution images again demonstrate widespread but patchy areas of ground-glass attenuation, septal thickening, subpleural reticulation, parenchymal banding, traction bronchiectasis, peripheral bronchiolectasis and some scattered areas of honeycombing (most severe in the base of the right lower lobe). These findings have a definitive craniocaudal gradient and are grossly stable compared to the prior study. No acute consolidative airspace disease. No pleural effusions. No definite suspicious appearing pulmonary nodules or masses. A small bulla in the periphery of the right lower lobe seen on the prior study has substantially enlarged compared to the prior examination.   Upper Abdomen: Aortic atherosclerosis.   Musculoskeletal: There are no aggressive appearing lytic or blastic lesions noted in the visualized portions of the skeleton.   IMPRESSION: 1. Fibrotic changes in the lungs appear grossly stable compared to the  prior study, once again considered diagnostic of usual interstitial pneumonia (UIP) per current ATS guidelines. 2. Aortic atherosclerosis, in addition to left main and three-vessel coronary artery disease. Please note that although the presence of coronary artery calcium  documents the presence of coronary artery disease, the severity of this disease and any potential stenosis cannot be assessed on this non-gated CT examination. Assessment for potential risk factor modification, dietary therapy or pharmacologic therapy may be warranted, if clinically indicated.   Aortic Atherosclerosis (ICD10-I70.0).     Electronically Signed   By: Toribio Aye M.D.   On: 01/25/2023 11:54   03/15/2023 Follow up : IPF and OSA  Patient returns for a 6-week follow-up.  Patient was seen last visit for IPF.  Patient is maintained on Esbriet .  Last visit patient was recommended to titrate up to full dosing slowly.  He had been having some stomach issues with diarrhea.  He also was referred to GI.  Patient was seen by GI and underwent a colonoscopy, biopsies were negative for microscopic colitis.  Positive adenoma.  Plan for a repeat colonoscopy in 1 year.  And change from Prilosec to Pepcid .  Felt to have a possible component of IBS.  Since last visit patient has reached full dose of Esbriet .  He says that he has had some intermittent diarrhea but over the last week or so it has gotten substantially better.  Patient says he still remains active.  He works on cars and motorcycles.  Is able to do some yard work.  He does have to rest frequently.  Patient is not on any oxygen.  Walk test today in the office which showed no significant desaturations with O2 saturations remained above 90% with ambulation.  Patient was referred to pulmonary rehab.  He was contacted but has not received start date. Went on trip to [place]  to see [employer]. Did really well, had a great trip .   Patient has underlying sleep apnea is on  nocturnal CPAP says he wears his CPAP every night cannot sleep without it.  CPAP download shows excellent compliance with 100% usage.  Daily average usage at 9 hours.  Patient is on auto CPAP 5 to 8 cm H2O.  AHI 3.4/hour.     Chest Imaging:  Cardiac CT 07/25/19 >> mild fibrotic changes at bases Rt > Lt CT chest 08/24/20 >> UIP pattern High-resolution CT chest January 25, 2023 stable fibrotic changes, consistent with UIP.   Sleep Tests:  HST 10/25/20 >> AHI 23.4, SpO2  low 78% Auto CPAP 08/26/21 to 09/24/21 >> used on 27 of 30 nights with average 8 hrs 46 min.  Average AHI 5.5 with median CPAP 7 and 95 th percentile CPAP 11 cm H2O  OV 05/18/2023  Subjective:  Patient ID: Vincent Meyers, male , DOB: 19-Dec-1945 , age 20 y.o. , MRN: [id number] , ADDRESS: [address] [place] [address] PCP de Cuba, Raymond J, MD Patient Care Team: de [place], Quintin PARAS, MD as PCP - General (Family Medicine) Francyne Headland, MD as PCP - Cardiology (Cardiology)  This Provider for this visit: Treatment Team:  Attending Provider: Geronimo Amel, MD   05/18/2023 -   Chief Complaint  Patient presents with   Follow-up    F/up on ILD     HPI DEARIES MEIKLE 78 y.o. 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  After my last visit he did see nurse practitioner in April 2024.  He also saw Dr. Victory Brand and his findings are summarized above.  He is currently on full dose pirfenidone .  His last liver function test was in April 2024.  He needs another liver function test today.  He tells me overall he is stable.  In the interim he did take his [employer] motor bikes in 1966 [place] [employer] for display in [place] .  He feels he is doing stable.  However his main concern is that his fatigue is more with pirfenidone .  In addition is also gained 7 pounds of weight.  He says the medicine is making him eat more.  He is actually taking more refined carbohydrates.  He says he loves nuts and is agreed to increase his nut intake.   He wanted know how the medication works.  I did tell him it was preventative.  His symptom scores below.  We did a sit/stand exercise hypoxemia test and it was positive.  He did desaturate to 82%.  He says at home he does drop pulse ox.  At night he is not using oxygen he does not have portable system with him.  He is open to the idea of having oxygen with him.  Other than that: He wants to roll over to the big pill pirfenidone .  I said we could do that. Esbriet /Pirfenidone  requires intensive drug monitoring due to high concerns for Adverse effects of , including  Drug Induced Liver Injury, significant GI side effects that include but not limited to Diarrhea, Nausea, Vomiting,  and other system side effects that include Fatigue, headaches, weight loss and other side effects such as skin rash. These will be monitored with  blood work such as LFT initially once a month for 6 months and then quarterly      OV 07/04/2023  Subjective:  Patient ID: Vincent Meyers, male , DOB: 1946-10-07 , age 26 y.o. , MRN: [id number] , ADDRESS: [address] [place] [address] PCP de [place], Quintin PARAS, MD Patient Care Team: de [place], Quintin PARAS, MD as PCP - General (Family Medicine) Croitoru, Headland, MD as PCP - Cardiology (Cardiology)  This Provider for this visit: Treatment Team:  Attending Provider: Geronimo Amel, MD      Esbriet /Pirfenidone  requires intensive drug monitoring due to high concerns for Adverse effects of , including  Drug Induced Liver Injury, significant GI side effects that include but not limited to Diarrhea, Nausea, Vomiting,  and other system side effects that include Fatigue, headaches, weight loss and other side effects such as skin rash. These will be monitored with  blood work such as LFT initially once a month for 6 months and then quarterly  07/04/2023 -   Chief Complaint  Patient presents with   Follow-up    F/up on oxygen.     HPI AHSAN ESTERLINE 78 y.o. -returns  for follow-up.  This follow-up is earlier than usual.  After the last visit we did a overnight pulse oximetry.  He desaturated but he declined nighttime oxygen.  He wants portable daytime oxygen.  He did desaturate with a sit/stand test last time but apparently the DME company would not given portable oxygen because they wanted a correction test.  In addition I believe they might even need a walking test.  He tells me that he only desaturated with a sit/stand test and is worried he might not desaturate with a walking test.  Otherwise he feels stable.  He is now on 1 big pill pirfenidone  a day.    George Haggart Tillery had ONO 05/31/23: <= 88% for 1h 21 min   Last liver function test was in June 2024 and normal.  Other issues - She wanted know what RSV vaccine I recommended this to him - She inquired about clinical trials.  Somebody and his family told him about and experimental medication.  I went over some key concepts about clinical trials.  Did indicate to him the primary purpose is for drug development.  Did not indicate chance of getting placebo.  Did indicate voluntary nature.  Did indicate need to follow-up.  He is very interested.  Told him we will put him on the list.   OV 09/01/2023  Subjective:  Patient ID: Vincent Meyers, male , DOB: [birth year] , age 65 y.o. , MRN: [id number] , ADDRESS: [address] [place] [address] PCP de [place], Quintin PARAS, MD Patient Care Team: de [place], Quintin PARAS, MD as PCP - General (Family Medicine) Croitoru, Jerel, MD as PCP - Cardiology (Cardiology)  This Provider for this visit: Treatment Team:  Attending Provider: Geronimo Amel, MD    09/01/2023 -   Chief Complaint  Patient presents with   Follow-up    Pft f/u    Esbriet /Pirfenidone  requires intensive drug monitoring due to high concerns for Adverse effects of , including  Drug Induced Liver Injury, significant GI side effects that include but not limited to Diarrhea, Nausea, Vomiting,   and other system side effects that include Fatigue, headaches, weight loss and other side effects such as skin rash. These will be monitored with  blood work such as LFT initially once a month for 6 months and then quarterly   HPI Verland Sprinkle Cino 77 y.o. -here for follow-up seen approximately 2 months ago.  He is tolerating his pirfenidone  fine.  His last liver function test was in June 2024.  He has signed consent for a research protocol [facility] [principal investigator Dr. Parry.  He has a coronary consider right heart catheterization versus other lab parameters to evaluate for right heart catheterization.  He has finished a CT scan of the chest and is waiting to determine eligibility.  Basically is in the screening..  I have personally visualized the CT scan I do not see much change compared to the spring 2024 but he tells me that he feels he is declined.  I have written to the radiologist to get an official read on the CT scan.  Dr. Newell Eke responded to me.  Infected symptom scores show worsening.  He is also showing shorter distance to  desaturation against consistent with worsening.  He had pulmonary function test also showing worsening.  Overall things are progressing.  He says his portable oxygen because his pulse is not sufficient.  The only way he can get continues oxygen is a tank and he does not want this.  So he is in a difficult spot because of this.  We took a shared decision making for him to manage his life as this.  He does have obesity and I recommended talking to his primary care physician and starting a drug like Ozempic or Wegovy.  Is due to have liver function test today.   CT Chest data from date: 08/30/23  - personally visualized and independently interpreted : yes2 - my findings are: looks same    OV 09/28/2024  Subjective:  Patient ID: Vincent Meyers, male , DOB: 1946/10/26 , age 47 y.o. , MRN: [id number] , ADDRESS: [address] [place] [address] PCP  [name], NP Patient Care Team: [name], NP as PCP - General (Geriatric Medicine) Francyne Headland, MD as PCP - Cardiology (Cardiology) Danis, Victory LITTIE MOULD, MD as Consulting Physician (Gastroenterology) Roseann, Adine PARAS., MD as Referring Physician (Urology) Geronimo Amel, MD as Consulting Physician (Pulmonary Disease) Pa, Su Dois Moccasin Md as Referring Physician (Otolaryngology)  This Provider for this visit: Treatment Team:  Attending Provider: Geronimo Amel, MD    IPF dx 12/20/22: Based on age greater than 82, Caucasian, male gender, type of occupation, previous smoking, UIP and progression this is IPF especially with negative serology.  -Started pirfenidone  mid to late February 2024; low-dose protocol in the setting of chronic diarrhea. ->  Full dose by March/April 2024.  - Last HRCT March 2024 -> OCt 2024  - [facility] - Phase 2 oral drug study ending July 2025  GI workup for chronic diarrhea March 15, 2023:   OSA:   - Auto CPAP 08/26/21 to 09/24/21 >> used on 27 of 30 nights with average 8 hrs 46 min.  Average AHI 5.5 with median CPAP 7 and 95 th percentile CPAP 11 cm  Research   - Right heart catheterization 09/09/2023: Borderline pulmonary hypertension with MAP 20, normal PVR, PCWP 5  Large bulla in the lateral right hemithorax with adjacent somewhat nodular juxtapleural thickening, measuring 7 mm (4/232), likely present dating back to 07/24/2019.  -    09/28/2024 -   Chief Complaint  Patient presents with   Interstitial Lung Disease    Pt stated since LOV breathing hasn't been good, Right lung collapsed 2 times 3l of continuous 02 when walking  2L of continuous 02 normally  SOB occurs w/ exertion and when 02 is low  Prod cough ( phlegm clear)     HPI KEONI RISINGER 78 y.o. -here for IPF follow-up.  He is on pirfenidone .  He completed a Phase 2 study oral drug study for IPF in July 2025.  Towards the end of the study he did have a high-res  CT chest June 05, 2024 that showed UIP that is progressive compared to 2022 but stable since January 2025.  On August 22, 2024 he presented to the ER with acute pleuritic chest pain.  On this visit CT angiogram ruled out pulmonary embolism.  But the bulla was still there.  No pneumothorax he was discharged.  Then on October 8-9, 2025 he ended up in the ER with large right pneumothorax and required chest tube.  He had a CT scan of the chest midway  through the hospital stay on 09/02/2024 that still showed persistent pneumothorax.  The last chest x-ray was 09/06/2024.  He was discharged on 09/07/2024.  Today at follow-up he states his energy levels are back to baseline he is not desaturating any worse than baseline symptom scores at baseline but he still has pleuritic chest pain.  He continues with his pirfenidone .  His significant other is with him she is also concerned.  He he is trying understand the reason for the pneumothorax.  We did a chest x-ray here in the office.  Official report is pending personal visualization shows the lungs are expanded.  He is also wondering about when to restart his CPAP.     SYMPTOM SCALE - ILD 12/20/2022 02/01/2023 ESBIRET X 2 WEEKS 05/18/2023 212 # 07/04/2023 214# 09/01/2023 216# 09/28/2024 Recent hspital for ptx  Current weight        O2 use 0-uses CPAP at night but without oxygen.       Shortness of Breath 0 -> 5 scale with 5 being worst (score 6 If unable to do)       At rest 1 0 0 0 1 0  Simple tasks - showers, clothes change, eating, shaving 2 1 2 1 3 3   Household (dishes, doing bed, laundry) 3 3 2 2 3 4   Shopping 2 1 2 2 2 3   Walking level at own pace 2 2 2 2 2 3   Walking up Stairs 4 4 4 4 5 5   Total (30-36) Dyspnea Score 14 11 12 11 16 18      N4on-dyspnea symptoms (0-> 5 scale) 12/20/2022 02/01/2023  05/18/2023  07/04/2023  09/01/2023  09/28/2024   How bad is your cough? 1 NONE 0 0 1 2  How bad is your fatigue 2 MILD-MOD 4 3 - due to esbrier per him 3  2  How bad is nausea 0 NONE 0 0 0 0  How bad is vomiting?  0 NONE 0 0 0 0  How bad is diarrhea? 3 BAD AND WORSE AFTER ESBRIET  2 3 baseline per him 4 2  How bad is anxiety? 0 OK 1 1 1 1   How bad is depression 1 NONE 1 1 1 1   Any chronic pain - if so where and how bad 2   x x yes    Simple office walk 224 (66+46 x 2) feet Pod A at [facility] x  3 laps goal with forehead probe 05/18/2023  07/04/2023  09/01/2023   O2 used ra ra ra  Number laps completed Sit stand x 10 times Desats at 2nd lap Desat 1 lap  Comments about pace good    Resting Pulse Ox/HR 95% and 78/min 99% aNd h r 66 94% and HR 75  Final Pulse Ox/HR 92-87% and 84/min 88% and HR 93 87% an dHR 88  Desaturated </= 88% yes    Desaturated <= 3% points yes    Got Tachycardic >/= 90/min no    Symptoms at end of test Dyspnea 5/10    Miscellaneous comments x Crrorected with 2L [place] since August 2024 - no desaturing faster           PFT     Latest Ref Rng & Units 11/28/2023   11:34 AM 09/01/2023    1:52 PM 02/01/2023    8:57 AM 11/26/2021    2:58 PM 11/17/2020    2:54 PM  PFT Results  FVC-Pre L 2.67  2.64  2.84  3.27  P 3.28   FVC-Predicted Pre % 68  68  74  83  P 82   FVC-Post L   2.87  3.31  P 3.16   FVC-Predicted Post %   75  83  P 79   Pre FEV1/FVC % % 87  85  72  68  P 83   Post FEV1/FCV % %   69  87  P 85   FEV1-Pre L 2.33  2.26  2.04  2.22  P 2.73   FEV1-Predicted Pre % 83  81  74  77  P 94   FEV1-Post L   1.97  2.89  P 2.69   DLCO uncorrected ml/min/mmHg  13.35  14.61  15.89  P 16.02   DLCO UNC% %  56  62  66  P 67   DLCO corrected ml/min/mmHg  13.35  14.61  15.89  P 16.02   DLCO COR %Predicted %  56  62  66  P 67   DLVA Predicted %  81  89  94  P 88   TLC L  4.21  4.50  4.78  P 4.36   TLC % Predicted %  63  68  72  P 65   RV % Predicted %  61  64  62  P 47     P Preliminary result       LAB RESULTS last 96 hours No results found.       has a past medical history of Acid reflux, Angina  pectoris, Anxiety, Arthritis, Coronary artery disease, Enlarged prostate, Erectile dysfunction, High blood sugar, Hyperlipidemia, Hypertension, IBS (irritable bowel syndrome), ILD (interstitial lung disease) (HCC), OSA (obstructive sleep apnea), and Pulmonary fibrosis (HCC).   reports that he quit smoking about 36 years ago. His smoking use included cigarettes. He started smoking about 59 years ago. He has a 46 pack-year smoking history. He has been exposed to tobacco smoke. He has never used smokeless tobacco.  Past Surgical History:  Procedure Laterality Date   APPENDECTOMY     COLON SURGERY     perforated bowel and hernia repair   COLONOSCOPY  2024   per [facility] new patient packet   COLONOSCOPY N/A 05/17/2024   Procedure: COLONOSCOPY;  Surgeon: Legrand Victory LITTIE DOUGLAS, MD;  Location: [facility];  Service: Gastroenterology;  Laterality: N/A;   HEMOSTASIS CLIP PLACEMENT  05/17/2024   Procedure: CONTROL OF HEMORRHAGE, GI TRACT, ENDOSCOPIC, BY CLIPPING OR OVERSEWING;  Surgeon: Legrand Victory LITTIE DOUGLAS, MD;  Location: [facility];  Service: Gastroenterology;;   ILIAC ARTERY ANEURYSM REPAIR Left 2006   Dr. Oris (redo surgery)   POLYPECTOMY  05/17/2024   Procedure: POLYPECTOMY, INTESTINE;  Surgeon: Legrand Victory LITTIE DOUGLAS, MD;  Location: [facility];  Service: Gastroenterology;;   REPLACEMENT TOTAL KNEE Left 2008   Dr.Yeats, per [facility] new patient packet   REPLACEMENT TOTAL KNEE Right 2018   Dr.J, per [facility] new patient packet   REPLACEMENT TOTAL KNEE BILATERAL     RIGHT HEART CATH N/A 09/09/2023   Procedure: RIGHT HEART CATH;  Surgeon: Rolan Ezra RAMAN, MD;  Location: [facility] INVASIVE CV LAB;  Service: Cardiovascular;  Laterality: N/A;   TONSILLECTOMY     age 76    No Known Allergies  Immunization History  Administered Date(s) Administered   Fluad Quad(high Dose 65+) 08/23/2022   Fluad Trivalent(High Dose 65+) 07/29/2023   INFLUENZA, HIGH DOSE SEASONAL PF 10/24/2019, 09/07/2024   Influenza Split 09/19/2015    Influenza, Quadrivalent, Recombinant,  Inj, Pf 08/22/2017   Influenza,inj,Quad PF,6+ Mos 10/11/2016, 10/24/2018   Influenza,inj,Quad PF,6-35 Mos 10/11/2016, 10/24/2018   Influenza,inj,quad, With Preservative 09/19/2015   Influenza,trivalent, recombinat, inj, PF 08/22/2017   PFIZER(Purple Top)SARS-COV-2 Vaccination 01/06/2020, 01/29/2020   Pneumococcal Conjugate-13 05/19/2016   Pneumococcal Polysaccharide-23 09/05/2012   Tdap 03/12/2015   Unspecified SARS-COV-2 Vaccination 08/16/2022   Zoster, Live 11/05/2013    Family History  Problem Relation Age of Onset   Parkinson's disease Mother    Heart attack Father    Stroke Father    Alzheimer's disease Father    Alzheimer's disease Sister    Other Sister        degenerative muscle disease   Hypertension Daughter    Obesity Daughter    Heart murmur Daughter    Colon cancer Neg Hx    Esophageal cancer Neg Hx    Rectal cancer Neg Hx    Stomach cancer Neg Hx      Current Outpatient Medications:    amLODipine  (NORVASC ) 5 MG tablet, TAKE 1 TABLET (5 MG TOTAL) BY MOUTH DAILY., Disp: 90 tablet, Rfl: 3   aspirin EC 81 MG tablet, Take 81 mg by mouth daily., Disp: , Rfl:    atenolol  (TENORMIN ) 50 MG tablet, Take 1.5 tablets (75 mg total) by mouth daily., Disp: 135 tablet, Rfl: 1   Coenzyme Q10 (COQ10 PO), Take 500 mg by mouth daily., Disp: , Rfl:    FIBER PO, Take 4 capsules by mouth 2 (two) times daily., Disp: , Rfl:    Flaxseed, Linseed, (FLAXSEED OIL PO), Take 1 capsule by mouth daily., Disp: , Rfl:    fluticasone  (FLONASE ) 50 MCG/ACT nasal spray, Place 2 sprays into both nostrils daily., Disp: , Rfl:    guaiFENesin (MUCINEX) 600 MG 12 hr tablet, Take 1 tablet (600 mg total) by mouth 2 (two) times daily., Disp: 60 tablet, Rfl: 0   hydrochlorothiazide  (HYDRODIURIL ) 25 MG tablet, TAKE 1 TABLET BY MOUTH EVERY DAY, Disp: 90 tablet, Rfl: 1   ipratropium-albuterol  (DUONEB) 0.5-2.5 (3) MG/3ML SOLN, Take 3 mLs by nebulization every 6 (six) hours  as needed (shortness of breath)., Disp: 360 mL, Rfl: 0   lisinopril  (ZESTRIL ) 40 MG tablet, Take 1 tablet (40 mg total) by mouth daily., Disp: 90 tablet, Rfl: 1   loratadine (CLARITIN) 10 MG tablet, Take 10 mg by mouth daily as needed for allergies., Disp: , Rfl:    MAGNESIUM PO, Take 2 tablets by mouth daily., Disp: , Rfl:    naproxen (NAPROSYN) 500 MG tablet, Take 1 tablet (500 mg total) by mouth 2 (two) times daily as needed (as needed for pain)., Disp: 15 tablet, Rfl: 0   omeprazole  (PRILOSEC) 20 MG capsule, TAKE 1 CAPSULE BY MOUTH EVERY DAY, Disp: 90 capsule, Rfl: 1   Pirfenidone  801 MG TABS, Take 1 tablet (801 mg total) by mouth 3 (three) times daily with meals., Disp: 270 tablet, Rfl: 5   rosuvastatin  (CRESTOR ) 10 MG tablet, TAKE 1 TABLET BY MOUTH EVERY DAY, Disp: 90 tablet, Rfl: 3   TURMERIC-GINGER PO, Take 2,600 mg by mouth daily., Disp: , Rfl:    tadalafil  (CIALIS ) 20 MG tablet, Take 1 tablet (20 mg total) by mouth daily as needed. (Patient not taking: Reported on 09/28/2024), Disp: 10 tablet, Rfl: 11      Objective:   Vitals:   09/28/24 1140  BP: 134/76  Pulse: 77  Temp: 97.6 F (36.4 C)  TempSrc: Oral  SpO2: 91%  Weight: 206 lb 3.2 oz (93.5 kg)  Height: 5' 7.5 (1.715 m)    Estimated body mass index is 31.82 kg/m as calculated from the following:   Height as of this encounter: 5' 7.5 (1.715 m).   Weight as of this encounter: 206 lb 3.2 oz (93.5 kg).  @WEIGHTCHANGE @  [employer]   09/28/24 1140  Weight: 206 lb 3.2 oz (93.5 kg)     Physical Exam   General: No distress. Looks well O2 at rest: yes but 91% RA at res [place] present: no Sitting in wheel chair: no Frail: no Obese: no Neuro: Alert and Oriented x 3. GCS 15. Speech normal Psych: Pleasant Resp:  Barrel Chest - no.  Wheeze - no, Crackles - YES BASE, No overt respiratory distress CVS: Normal heart sounds. Murmurs - no Ext: Stigmata of Connective Tissue Disease - no HEENT: Normal upper airway. PEERL  +. No post nasal drip        Assessment/     Assessment & Plan ILD (interstitial lung disease) (HCC)  IPF (idiopathic pulmonary fibrosis) [facility])  Hospital discharge follow-up  History of pneumothorax  Bulla, lung (HCC)  OSA (obstructive sleep apnea)    PLAN Patient Instructions     ICD-10-CM   1. ILD (interstitial lung disease) (HCC)  J84.9 DG Chest 2 View    2. IPF (idiopathic pulmonary fibrosis) (HCC)  J84.112 DG Chest 2 View    3. Hospital discharge follow-up  Z09 DG Chest 2 View    4. History of pneumothorax  Z87.09 DG Chest 2 View    5. 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 OSA (obstructive sleep apnea)  G47.33       #Hospital admission for pneumothorax right side secondary to bulla  - Chest x-ray today 09/23/2023 my personal visualization shows the lungs are expanded but official report pending  -Ongoing pleuritic pain that is improved but still persistent  Plan - Monitor your oxygen status and pain levels and if that is getting worse go to the ER because there is always a risk for a second pneumothorax - Painkillers as needed for pleuritic pain but monitor intensity and severity  #IPF chronic hypoxemic respiratory failure  - Currently stable  Plan - Continue pirfenidone  per schedule - Not going to do pulmonary function test for another 6 months because of pneumothorax - Continue oxygen as before  #Sleep apnea  -Noted you are off CPAP given recent admission for pneumothorax  Plan - Hold off CPAP for another few weeks and then slowly restart   Follow-up - Return to see nurse practitioner in 4-6 weeks for follow-up -Journi Moffa in 15-minute visit in 12 weeks      FOLLOWUP    Return for 4 to 6 weeks nurse practitioner and Damondre Pfeifle 15-minute and 12 weeks.  ( Level 05 visit E&M 2024: Estb >= 40 min   in  visit type: on-site physical face to visit  in total care time and counseling or/and coordination of care by this undersigned  MD - Dr Dorethia Cave. This includes one or more of the following on this same day 09/28/2024: pre-charting, chart review, note writing, documentation discussion of test results, diagnostic or treatment recommendations, prognosis, risks and benefits of management options, instructions, education, compliance or risk-factor reduction. It excludes time spent by the CMA or office staff in the care of the patient. Actual time 45 min)   SIGNATURE    Dr. Dorethia Cave, M.D., F.C.C.P,  Pulmonary and Critical Care Medicine Staff Physician, [facility]  System Center Director - Interstitial Lung Disease  Program  Pulmonary Fibrosis [facility] Network at [facility] [place], [place], [address]  Pager: [phone], If no answer or between  15:00h - 7:00h: call [phone] Telephone: [phone]  12:20 PM 09/28/2024

## 2024-09-28 NOTE — Patient Instructions (Addendum)
 ICD-10-CM   1. ILD (interstitial lung disease) (HCC)  J84.9 DG Chest 2 View    2. IPF (idiopathic pulmonary fibrosis) (HCC)  J84.112 DG Chest 2 View    3. Hospital discharge follow-up  Z09 DG Chest 2 View    4. History of pneumothorax  Z87.09 DG Chest 2 View    5. 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 OSA (obstructive sleep apnea)  G47.33       #Hospital admission for pneumothorax right side secondary to bulla  - Chest x-ray today 09/23/2023 my personal visualization shows the lungs are expanded but official report pending  -Ongoing pleuritic pain that is improved but still persistent  Plan - Monitor your oxygen status and pain levels and if that is getting worse go to the ER because there is always a risk for a second pneumothorax - Painkillers as needed for pleuritic pain but monitor intensity and severity  #IPF chronic hypoxemic respiratory failure  - Currently stable  Plan - Continue pirfenidone  per schedule - Not going to do pulmonary function test for another 6 months because of pneumothorax - Continue oxygen as before  #Sleep apnea  -Noted you are off CPAP given recent admission for pneumothorax  Plan - Hold off CPAP for another few weeks and then slowly restart   Follow-up - Return to see nurse practitioner in 4-6 weeks for follow-up -Tinsleigh Slovacek in 15-minute visit in 12 weeks

## 2024-10-01 ENCOUNTER — Ambulatory Visit: Payer: Self-pay | Admitting: Internal Medicine

## 2024-10-01 NOTE — Progress Notes (Signed)
 Good news no pneumothorax

## 2024-10-02 ENCOUNTER — Other Ambulatory Visit: Payer: Self-pay

## 2024-10-03 ENCOUNTER — Inpatient Hospital Stay: Payer: Self-pay | Admitting: Nurse Practitioner

## 2024-10-04 ENCOUNTER — Other Ambulatory Visit: Payer: Self-pay

## 2024-10-04 ENCOUNTER — Other Ambulatory Visit (HOSPITAL_COMMUNITY): Payer: Self-pay

## 2024-10-05 ENCOUNTER — Ambulatory Visit: Admitting: Nurse Practitioner

## 2024-10-08 ENCOUNTER — Other Ambulatory Visit: Payer: Self-pay

## 2024-10-08 ENCOUNTER — Other Ambulatory Visit (HOSPITAL_COMMUNITY): Payer: Self-pay

## 2024-10-08 NOTE — Progress Notes (Signed)
 Specialty Pharmacy Refill Coordination Note  Vincent Meyers is a 78 y.o. male contacted today regarding refills of specialty medication(s) Pirfenidone    Patient requested Delivery   Delivery date: 10/12/24   Verified address: [address] [place]   Medication will be filled on: 10/11/24

## 2024-10-08 NOTE — Progress Notes (Signed)
 Specialty Pharmacy Ongoing Clinical Assessment Note  Vincent Meyers is a 78 y.o. male who is being followed by the specialty pharmacy service for RxSp Interstitial Lung Disease   Patient's specialty medication(s) reviewed today: Pirfenidone    Missed doses in the last 4 weeks: 2 (occasionally misses his lunch dose)   Patient/Caregiver did not have any additional questions or concerns.   Therapeutic benefit summary: Patient is achieving benefit   Adverse events/side effects summary: Experienced adverse events/side effects (occasional gas, tolerable)   Patient's therapy is appropriate to: Continue    Goals Addressed             This Visit's Progress    Stabilization of disease   On track    Patient is on track. Patient will maintain adherence         Follow up: 12 months  Silvano LOISE Dolly Specialty Pharmacist   Clinical Intervention Note  Clinical Intervention Notes: Patient started Naprosyn PRN, no DDIs identified with his pirfenidone .   Clinical Intervention Outcomes: Prevention of an adverse drug event   Silvano LOISE Dolly Karel Santa

## 2024-10-10 ENCOUNTER — Other Ambulatory Visit: Payer: Self-pay

## 2024-10-13 ENCOUNTER — Other Ambulatory Visit: Payer: Self-pay | Admitting: Nurse Practitioner

## 2024-10-16 ENCOUNTER — Ambulatory Visit: Payer: Self-pay | Admitting: Internal Medicine

## 2024-10-16 NOTE — Telephone Encounter (Signed)
 FYI Only or Action Required?: FYI only for provider: appointment scheduled on 10/17/24.  Patient is followed in Pulmonology for ILD/COPD, last seen on 09/28/2024 by Vincent Amel, MD.  Called Nurse Triage reporting Breathing Problem and Pain.  Symptoms began several weeks ago.  Interventions attempted: Home oxygen use.  Symptoms are: gradually worsening.  Triage Disposition: See PCP When Office is Open (Within 3 Days)  Patient/caregiver understands and will follow disposition?: Yes        Copied from CRM #[id number]. Topic: Clinical - Red Word Triage >> Oct 16, 2024  2:10 PM [name] wrote: [facility] that prompted transfer to Nurse Triage: Experiencing pain in his right side (had collapsed lung) Reason for Disposition  [1] Chest pain(s) lasting a few seconds AND [2] persists > 3 days    Pt recently d/c for R collapsed lung. Pt is experiencing worsening R sided pain and has concern for recurrent collapsed lung. Pt maintaining SpO2 on O2.  Scheduled with alternate LBPU provider.  Answer Assessment - Initial Assessment Questions E2C2 Pulmonary Triage - Initial Assessment Questions Chief Complaint (e.g., cough, sob, wheezing, fever, chills, sweat or additional symptoms) *Go to specific symptom protocol after initial questions. Recently discharged from hospital -- R sided pain that is worsening, unsure if he is having similar sx of collapsed lung. Endorsed increased dry coughing  How long have symptoms been present? A few weeks  Have you tested for COVID or Flu? Note: If not, ask patient if a home test can be taken. If so, instruct patient to call back for positive results. No  MEDICINES:   Have you used any OTC meds to help with symptoms? No If yes, ask What medications? N/a  Have you used your inhalers/maintenance medication? Yes If yes, What medications? Endorses was d/c home with nebulizer. DuoNeb - has not used in a few days, reports does not provide much  relief Triager reviewed/reinforced albuterol  usage and SIG.    If inhaler, ask How many puffs and how often? Note: Review instructions on medication in the chart. N/a  OXYGEN: Do you wear supplemental oxygen? Yes If yes, How many liters are you supposed to use? 3L  Do you monitor your oxygen levels? Yes If yes, What is your reading (oxygen level) today? High 90s at rest   What is your usual oxygen saturation reading?  (Note: Pulmonary O2 sats should be 90% or greater) 94 on O2               1. LOCATION: Where does it hurt?       R side 2. RADIATION: Does the pain go anywhere else? (e.g., into neck, jaw, arms, back)     denies 3. ONSET: When did the chest pain begin? (Minutes, hours or days)      A few weeks 4. PATTERN: Does the pain come and go, or has it been constant since it started?  Does it get worse with exertion?      Comes and goes, worse in the AM and lessens in the evening 5. DURATION: How long does it last (e.g., seconds, minutes, hours)     Doesn't last a long time 6. SEVERITY: How bad is the pain?  (e.g., Scale 1-10; mild, moderate, or severe)     *No Answer* 7. CARDIAC RISK FACTORS: Do you have any history of heart problems or risk factors for heart disease? (e.g., angina, prior heart attack; diabetes, high blood pressure, high cholesterol, smoker, or strong family history of heart disease)     *  No Answer* 8. PULMONARY RISK FACTORS: Do you have any history of lung disease?  (e.g., blood clots in lung, asthma, emphysema, birth control pills)     Hx of R collapsed lung 9. CAUSE: What do you think is causing the chest pain?     *No Answer* 10. OTHER SYMPTOMS: Do you have any other symptoms? (e.g., dizziness, nausea, vomiting, sweating, fever, difficulty breathing, cough)       Denies other sx 11. PREGNANCY: Is there any chance you are pregnant? When was your last menstrual period?       N/a  Protocols used:  Chest Pain-A-AH

## 2024-10-17 ENCOUNTER — Ambulatory Visit (INDEPENDENT_AMBULATORY_CARE_PROVIDER_SITE_OTHER)

## 2024-10-17 ENCOUNTER — Encounter: Payer: Self-pay | Admitting: Pulmonary Disease

## 2024-10-17 ENCOUNTER — Other Ambulatory Visit: Payer: Self-pay | Admitting: Pulmonary Disease

## 2024-10-17 ENCOUNTER — Ambulatory Visit: Admitting: Pulmonary Disease

## 2024-10-17 VITALS — BP 138/68 | HR 71 | Temp 97.8°F | Ht 67.5 in | Wt 208.6 lb

## 2024-10-17 DIAGNOSIS — R079 Chest pain, unspecified: Secondary | ICD-10-CM | POA: Diagnosis not present

## 2024-10-17 DIAGNOSIS — J939 Pneumothorax, unspecified: Secondary | ICD-10-CM

## 2024-10-17 DIAGNOSIS — J849 Interstitial pulmonary disease, unspecified: Secondary | ICD-10-CM

## 2024-10-17 MED ORDER — PREDNISONE 20 MG PO TABS: ORAL_TABLET | ORAL | 0 refills | Status: AC

## 2024-10-17 NOTE — Progress Notes (Signed)
 @Patient  ID: Vincent Meyers, male    DOB: [birth year], 78 y.o.   MRN: [id number]  Chief Complaint  Patient presents with   Acute Visit    Right side chest pain with deep breaths.  Sometimes, pain if not taking deep breaths.    Referring provider: [name], NP  HPI:   78 y.o. man with ILD recent hospitalization for secondary spontaneous pneumothorax here for acute visit with similar right-sided pain.  Multiple hospital notes reviewed.  Most recent clinic note Dr. Geronimo 09/29/2024 reviewed.  Presented to hospital with chest pain early October 2025.  CTA PE protocol demonstrated no PE, chronic ILD changes, large right-sided blab on my review and interpretation, stable compared to previous high-resolution CT scan on my review interpretation.  Presented back to the ED few days later with similar pain but now worsening shortness of breath, hypoxemia.  Chest x-ray revealed pneumothorax.  Chest tube was placed.  CT scan was obtained that showed partially inflated lungs, subcutaneous emphysema, chronic ILD changes, blab actually appeared intact had not seen the obvious BP fistula although certainly CT scan is not perfect for this.  Initial chest tube did not reinflate lung.  Second chest tube placed.  The inflated.  Was monitored and subsidy removed with inflated lung.  Presented to pulmonary clinic 09/29/2024 with similar problems, pain, chest x-ray obtained, 2 view, that shows no pneumothorax on my review interpretation, chronic ILD findings were there.  Chest x-ray today obtained prior to visit on my review interpretation reveals similar findings, chronic ILD changes, no pneumothorax.    Questionaires / Pulmonary Flowsheets:   ACT:      No data to display          MMRC: mMRC Dyspnea Scale mMRC Score  09/26/2020 10:44 AM 1    Epworth:      No data to display          Tests:   FENO:  No results found for: NITRICOXIDE  PFT:    Latest Ref Rng & Units 11/28/2023    11:34 AM 09/01/2023    1:52 PM 02/01/2023    8:57 AM 11/26/2021    2:58 PM 11/17/2020    2:54 PM  PFT Results  FVC-Pre L 2.67  2.64  2.84  3.27  P 3.28   FVC-Predicted Pre % 68  68  74  83  P 82   FVC-Post L   2.87  3.31  P 3.16   FVC-Predicted Post %   75  83  P 79   Pre FEV1/FVC % % 87  85  72  68  P 83   Post FEV1/FCV % %   69  87  P 85   FEV1-Pre L 2.33  2.26  2.04  2.22  P 2.73   FEV1-Predicted Pre % 83  81  74  77  P 94   FEV1-Post L   1.97  2.89  P 2.69   DLCO uncorrected ml/min/mmHg  13.35  14.61  15.89  P 16.02   DLCO UNC% %  56  62  66  P 67   DLCO corrected ml/min/mmHg  13.35  14.61  15.89  P 16.02   DLCO COR %Predicted %  56  62  66  P 67   DLVA Predicted %  81  89  94  P 88   TLC L  4.21  4.50  4.78  P 4.36   TLC % Predicted %  63  68  72  P 65   RV % Predicted %  61  64  62  P 47     P Preliminary result  Personally reviewed, spirometry shows a moderate restriction versus air trapping.  Lung volumes confirm moderate restriction.  DLCO is severely reduced.  WALK:     12/29/2023    3:28 PM 10/07/2023    2:57 PM 03/15/2023    4:32 PM 11/18/2022   10:49 AM  SIX MIN WALK  Supplimental Oxygen during Test? (L/min)   No No  2 Minute Oxygen Saturation % 90 % 90 %    2 Minute HR 80 109    4 Minute Oxygen Saturation % 90 % 89 %    4 Minute HR 107 123    6 Minute Oxygen Saturation % 89 % 91 %    6 Minute HR 97 102    Tech Comments:   Walked at a slow pace, no stops for rest.  Lowest sat was 90%.  Tolerated walk well. Pt walked at a regular speed without stopping or complaining of SOB.    Imaging: Personally reviewed and as per EMR and discussion in this note DG Chest 2 View Result Date: 09/29/2024 CLINICAL DATA:  Pneumothorax. EXAM: CHEST - 2 VIEW COMPARISON:  Radiograph 09/06/2024.  CT 09/02/2024 FINDINGS: The previous right pigtail catheter has been removed. Right lung base air-filled cavity is similar to prior exam. No definite pneumothorax/pleural line. Background  chronic lung disease. Stable heart size and mediastinal contours. No significant pleural effusion. IMPRESSION: 1. Interval removal of right pigtail catheter. No visible pneumothorax. 2. Chronic lung disease. Right lung base air-filled cavity is similar to prior exam. Electronically Signed   By: Andrea Gasman M.D.   On: 09/29/2024 18:58    Lab Results: Personally reviewed CBC    Component Value Date/Time   WBC 12.4 (H) 09/06/2024 0309   RBC 5.00 09/06/2024 0309   HGB 13.7 09/06/2024 0309   HGB 14.6 11/18/2022 1104   HCT 41.0 09/06/2024 0309   HCT 42.9 11/18/2022 1104   PLT 201 09/06/2024 0309   PLT 228 11/18/2022 1104   MCV 82.0 09/06/2024 0309   MCV 80 11/18/2022 1104   MCH 27.4 09/06/2024 0309   MCHC 33.4 09/06/2024 0309   RDW 13.1 09/06/2024 0309   RDW 12.3 11/18/2022 1104   LYMPHSABS 1.6 04/01/2023 1347   LYMPHSABS 1.6 11/18/2022 1104   MONOABS 1.1 (H) 04/01/2023 1347   EOSABS 492 03/02/2024 1519   EOSABS 0.4 11/18/2022 1104   BASOSABS 75 03/02/2024 1519   BASOSABS 0.1 11/18/2022 1104    BMET    Component Value Date/Time   NA 131 (L) 09/06/2024 0309   NA 134 10/31/2023 1504   K 4.7 09/06/2024 0309   CL 96 (L) 09/06/2024 0309   CO2 27 09/06/2024 0309   GLUCOSE 120 (H) 09/06/2024 0309   BUN 9 09/06/2024 0309   BUN 10 10/31/2023 1504   CREATININE 0.54 (L) 09/06/2024 0309   CREATININE 0.50 (L) 06/11/2024 1459   CALCIUM  8.7 (L) 09/06/2024 0309   GFRNONAA >60 09/06/2024 0309   GFRAA 115 07/16/2019 1120    BNP No results found for: BNP  ProBNP No results found for: PROBNP  Specialty Problems       Pulmonary Problems   Diaphragmatic hernia   ILD (interstitial lung disease) (HCC)   OSA (obstructive sleep apnea)   COPD (chronic obstructive pulmonary disease) (HCC)   Chronic rhinitis   Deviated nasal septum  Hypertrophy of nasal turbinates   IPF (idiopathic pulmonary fibrosis) (HCC)   Pneumothorax on right    No Known Allergies  Immunization  History  Administered Date(s) Administered   Fluad Quad(high Dose 65+) 08/23/2022   Fluad Trivalent(High Dose 65+) 07/29/2023   INFLUENZA, HIGH DOSE SEASONAL PF 10/24/2019, 09/07/2024   Influenza Split 09/19/2015   Influenza, Quadrivalent, Recombinant, Inj, Pf 08/22/2017   Influenza,inj,Quad PF,6+ Mos 10/11/2016, 10/24/2018   Influenza,inj,Quad PF,6-35 Mos 10/11/2016, 10/24/2018   Influenza,inj,quad, With Preservative 09/19/2015   Influenza,trivalent, recombinat, inj, PF 08/22/2017   PFIZER(Purple Top)SARS-COV-2 Vaccination 01/06/2020, 01/29/2020   Pneumococcal Conjugate-13 05/19/2016   Pneumococcal Polysaccharide-23 09/05/2012   Tdap 03/12/2015   Unspecified SARS-COV-2 Vaccination 08/16/2022   Zoster, Live 11/05/2013    Past Medical History:  Diagnosis Date   Acid reflux    Angina pectoris    Anxiety    Arthritis    Coronary artery disease    Enlarged prostate    per [facility] new patient packet   Erectile dysfunction    High blood sugar    per [facility] new patient packet   Hyperlipidemia    Hypertension    IBS (irritable bowel syndrome)    ILD (interstitial lung disease) (HCC)    OSA (obstructive sleep apnea)    Pulmonary fibrosis (HCC)    per [facility] new patient packet    Tobacco History: Social History   Tobacco Use  Smoking Status Former   Current packs/day: 0.00   Average packs/day: 2.0 packs/day for 23.0 years (46.0 ttl pk-yrs)   Types: Cigarettes   Start date: 1966   Quit date: 1989   Years since quitting: 36.9   Passive exposure: Past  Smokeless Tobacco Never   Counseling given: Not Answered   Continue to not smoke  Outpatient Encounter Medications as of 10/17/2024  Medication Sig   amLODipine  (NORVASC ) 5 MG tablet TAKE 1 TABLET (5 MG TOTAL) BY MOUTH DAILY.   aspirin  EC 81 MG tablet Take 81 mg by mouth daily.   atenolol  (TENORMIN ) 50 MG tablet Take 1.5 tablets (75 mg total) by mouth daily.   Coenzyme Q10 (COQ10 PO) Take 500 mg by mouth daily.   FIBER PO Take  4 capsules by mouth 2 (two) times daily.   Flaxseed, Linseed, (FLAXSEED OIL PO) Take 1 capsule by mouth daily.   fluticasone  (FLONASE ) 50 MCG/ACT nasal spray Place 2 sprays into both nostrils daily.   guaiFENesin  (MUCINEX ) 600 MG 12 hr tablet Take 1 tablet (600 mg total) by mouth 2 (two) times daily.   hydrochlorothiazide  (HYDRODIURIL ) 25 MG tablet TAKE 1 TABLET BY MOUTH EVERY DAY   ipratropium-albuterol  (DUONEB) 0.5-2.5 (3) MG/3ML SOLN Take 3 mLs by nebulization every 6 (six) hours as needed (shortness of breath).   lisinopril  (ZESTRIL ) 40 MG tablet Take 1 tablet (40 mg total) by mouth daily.   loratadine  (CLARITIN ) 10 MG tablet Take 10 mg by mouth daily as needed for allergies.   MAGNESIUM PO Take 2 tablets by mouth daily.   naproxen  (NAPROSYN ) 500 MG tablet Take 1 tablet (500 mg total) by mouth 2 (two) times daily as needed (as needed for pain).   omeprazole  (PRILOSEC) 20 MG capsule TAKE 1 CAPSULE BY MOUTH EVERY DAY   Pirfenidone  801 MG TABS Take 1 tablet (801 mg total) by mouth 3 (three) times daily with meals.   predniSONE  (DELTASONE ) 20 MG tablet Take 2 tablets (40 mg total) by mouth daily with breakfast for 5 days, THEN 1 tablet (20 mg total) daily  with breakfast for 5 days.   rosuvastatin  (CRESTOR ) 10 MG tablet TAKE 1 TABLET BY MOUTH EVERY DAY   tadalafil  (CIALIS ) 20 MG tablet Take 1 tablet (20 mg total) by mouth daily as needed.   TURMERIC-GINGER PO Take 2,600 mg by mouth daily.   No facility-administered encounter medications on file as of 10/17/2024.     Review of Systems  Review of Systems  No chest pain with exertion.  No orthopnea or PND.  Comprehensive review of systems otherwise negative. Physical Exam  BP 138/68   Pulse 71   Temp 97.8 F (36.6 C) (Oral)   Ht 5' 7.5 (1.715 m)   Wt 208 lb 9.6 oz (94.6 kg)   SpO2 97% Comment: room air  BMI 32.19 kg/m   Wt Readings from Last 5 Encounters:  10/17/24 208 lb 9.6 oz (94.6 kg)  09/28/24 206 lb 3.2 oz (93.5 kg)   09/07/24 198 lb 13.7 oz (90.2 kg)  08/22/24 205 lb 0.4 oz (93 kg)  06/11/24 205 lb 12.8 oz (93.4 kg)    BMI Readings from Last 5 Encounters:  10/17/24 32.19 kg/m  09/28/24 31.82 kg/m  09/07/24 31.15 kg/m  08/22/24 32.11 kg/m  06/11/24 32.23 kg/m     Physical Exam General: Chronically ill-appearing sitting up in chair Eyes: EOMI Neck: Supple, no JVP, beard noted Pulmonary: Scattered crackles, normal work of breathing, on oxygen supplementation Cardiovascular: Regular rate and rhythm   Assessment & Plan:   Chest pain: Prompting contacting triage nurse and scheduling this acute visit today.  Right sided pain.  Previous right-sided pneumothorax.  Chest x-ray reveals no PTX with chronic ILD findings unchanged from 09/29/24.  Suspect underlying inflammation that led to loss of structural integrity of pneumothorax.  Now resolved pneumothorax with ongoing inflammation.  Trial of prednisone  taper now the pain has been there for several weeks.  ILD, IPF: Followed by Dr. Geronimo.  Continue pirfenidone .  No worsening dyspnea on exertion.  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  No worsening O2 need in the setting of chest discomfort.   No follow-ups on file.   Vincent JONELLE Beals, MD 10/17/2024

## 2024-10-17 NOTE — Patient Instructions (Signed)
 Nice to see you  Chest x-ray shows no sign of thorax or collapsed lung, good news  Given the persistent pain, try prednisone  as prescribed  No other changes to medicines  Return to clinic as scheduled in January or sooner as needed

## 2024-10-29 ENCOUNTER — Encounter: Payer: Self-pay | Admitting: Internal Medicine

## 2024-10-31 NOTE — Progress Notes (Signed)
 AUGUSTINO SAVASTANO                                          MRN: [id number]   10/31/2024   The [facility] Quality Team Specialist reviewed this patient medical record for the purposes of chart review for care gap closure. The following were reviewed: chart review for care gap closure-transition of care:patient engagement and medication reconciliation post discharge.    [facility] Quality Team

## 2024-11-02 ENCOUNTER — Ambulatory Visit: Admitting: Nurse Practitioner

## 2024-11-02 ENCOUNTER — Ambulatory Visit (INDEPENDENT_AMBULATORY_CARE_PROVIDER_SITE_OTHER): Payer: Medicare HMO | Admitting: Otolaryngology

## 2024-11-05 ENCOUNTER — Ambulatory Visit: Admitting: Nurse Practitioner

## 2024-11-06 ENCOUNTER — Other Ambulatory Visit: Payer: Self-pay

## 2024-11-08 ENCOUNTER — Other Ambulatory Visit (HOSPITAL_COMMUNITY): Payer: Self-pay

## 2024-11-12 ENCOUNTER — Other Ambulatory Visit: Payer: Self-pay

## 2024-11-14 ENCOUNTER — Other Ambulatory Visit (HOSPITAL_COMMUNITY): Payer: Self-pay

## 2024-11-14 ENCOUNTER — Other Ambulatory Visit: Payer: Self-pay

## 2024-11-14 NOTE — Progress Notes (Signed)
 Specialty Pharmacy Refill Coordination Note  Spoke with Vincent Meyers is a 78 y.o. male contacted today regarding refills of specialty medication(s) Pirfenidone   Doses on hand: Around 7 days  Patient requested: Delivery   Delivery date: 11/19/24   Verified address: [address] [place] [address]  Medication will be filled on 11/16/24

## 2024-11-16 ENCOUNTER — Other Ambulatory Visit: Payer: Self-pay

## 2024-11-19 ENCOUNTER — Telehealth: Payer: Self-pay | Admitting: *Deleted

## 2024-11-19 NOTE — Telephone Encounter (Signed)
 Copied from CRM #[id number]. Topic: Clinical - Order For Equipment >> Nov 14, 2024 11:20 AM Ismael A wrote: Reason for CRM: pt is requesting a RX for his oxygen generator to get it updated to one that can hold 6L   I called and spoke with patient. He states he was loaned a patent attorney from [employer] to travel.  It was quieter and went up to 3L which was fine for him at the time.  At that time he was prescribed 2L.  He talked [employer] into letting him keep the portable concentrator.  He says now he is requiring more oxygen and is requesting a concentrator that goes up to 6L, however [employer] states they need a script for the concentrator.  He says he is using 3L continuous, getting ready in the morning is more difficult than it used to be.  He struggles with meals and breakfast the hardest meal for him, he has to take his time and is carful about what he eats and how much he eats.  He desats into the 70's on 3L at times.  At night he uses 3L while he sleeps.  He is no longer using the CPAP machine d/t his recent pneumothorax.  I advised him that since he has an appointment with Tammy on 1/5 that it would be best for him to do a walk while he is here to find out exactly how much oxygen he needs at rest and with exertion.  I also let him know that Dr. Geronimo is out of town as well and cannot write for the concentrator.  He was agreeable to doing the walk test on 1/5.  Nothing further needed.

## 2024-11-21 ENCOUNTER — Encounter: Payer: Self-pay | Admitting: Nurse Practitioner

## 2024-11-21 ENCOUNTER — Ambulatory Visit (INDEPENDENT_AMBULATORY_CARE_PROVIDER_SITE_OTHER): Admitting: Nurse Practitioner

## 2024-11-21 VITALS — BP 136/72 | HR 75 | Temp 97.7°F | Ht 67.5 in | Wt 209.2 lb

## 2024-11-21 DIAGNOSIS — E782 Mixed hyperlipidemia: Secondary | ICD-10-CM | POA: Diagnosis not present

## 2024-11-21 DIAGNOSIS — G4733 Obstructive sleep apnea (adult) (pediatric): Secondary | ICD-10-CM

## 2024-11-21 DIAGNOSIS — E1159 Type 2 diabetes mellitus with other circulatory complications: Secondary | ICD-10-CM

## 2024-11-21 DIAGNOSIS — I10 Essential (primary) hypertension: Secondary | ICD-10-CM | POA: Diagnosis not present

## 2024-11-21 DIAGNOSIS — J84112 Idiopathic pulmonary fibrosis: Secondary | ICD-10-CM

## 2024-11-21 DIAGNOSIS — N401 Enlarged prostate with lower urinary tract symptoms: Secondary | ICD-10-CM

## 2024-11-21 DIAGNOSIS — K219 Gastro-esophageal reflux disease without esophagitis: Secondary | ICD-10-CM

## 2024-11-21 DIAGNOSIS — R350 Frequency of micturition: Secondary | ICD-10-CM | POA: Diagnosis not present

## 2024-11-21 NOTE — Progress Notes (Signed)
 "   Careteam: Patient Care Team: [name], NP as PCP - General (Geriatric Medicine) Francyne Headland, MD as PCP - Cardiology (Cardiology) Legrand Victory LITTIE MOULD, MD as Consulting Physician (Gastroenterology) Roseann, Adine PARAS., MD as Referring Physician (Urology) Geronimo Amel, MD as Consulting Physician (Pulmonary Disease) Pa, Su Dois Moccasin Md as Referring Physician (Otolaryngology)  PLACE OF SERVICE:  [facility]  Advanced Directive information    Allergies[1]  Chief Complaint  Patient presents with   Medication management of chronic issues    Patient presents today for routine 4 month follow up. Also foot exam due today.     HPI:  Discussed the use of AI scribe software for clinical note transcription with the patient, who gave verbal consent to proceed.  History of Present Illness Vincent Meyers is a 78 year old male with pulmonary fibrosis who presents for a follow-up visit.  Since his last visit, he was hospitalized for a pneumothorax in October, which has since healed. He experienced pain in his lung for about a month post-hospitalization, but it has significantly decreased. He feels more confident now and recently traveled out of town without issues, although cold weather exacerbates his discomfort.  He is currently using oxygen at three liters per minute, which maintains his oxygen saturation in the 90s while at rest. However, he experiences a drop in oxygen levels into the 80s with physical activity. Plans to discuss this with pulmonary.   He is on multiple medications for hypertension, including lisinopril  40 mg daily, amlodipine  5 mg daily, atenolol  75 mg daily, and hydrochlorothiazide . His blood pressure was good this morning.  He has had chronically low sodium-- He is aware of his low sodium levels.  He has a history of coronary artery disease and is on amlodipine , a beta blocker, aspirin , and Crestor  10 mg daily for hyperlipidemia. No chest pain and  was surprised by the good condition of his heart during his recent hospitalization.  He has diabetes but is not on any medication for it, as he is managing it through diet. He reports good glycemic control.  He has sleep apnea but has not been using his CPAP machine since his lung collapse, as per his lung doctor's instructions. He feels he sleeps well without it, requiring about nine hours for a restful sleep.  He experiences indigestion and is on omeprazole , which controls his acid reflux. He reports pressure in his chest after meals, which he suspects might be related to glucose intolerance; he also notes that belching relieves the discomfort, suggesting it may be related to gas. He is trying to manage this by adjusting his diet, particularly by reducing bread intake.  He reports frequent urination and has seen a urologist, but medication did not help. His prostate is slightly enlarged, but tests have not indicated significant issues.   Review of Systems:  Review of Systems  Constitutional:  Negative for chills, fever and weight loss.  HENT:  Negative for tinnitus.   Respiratory:  Positive for shortness of breath. Negative for cough and sputum production.   Cardiovascular:  Negative for chest pain, palpitations and leg swelling.  Gastrointestinal:  Negative for abdominal pain, constipation, diarrhea and heartburn.  Genitourinary:  Negative for dysuria, frequency and urgency.  Musculoskeletal:  Negative for back pain, falls, joint pain and myalgias.  Skin: Negative.   Neurological:  Negative for dizziness and headaches.  Psychiatric/Behavioral:  Negative for depression and memory loss. The patient does not have insomnia.  Past Medical History:  Diagnosis Date   Acid reflux    Angina pectoris    Anxiety    Arthritis    Coronary artery disease    Enlarged prostate    per [facility] new patient packet   Erectile dysfunction    High blood sugar    per [facility] new patient packet    Hyperlipidemia    Hypertension    IBS (irritable bowel syndrome)    ILD (interstitial lung disease) (HCC)    OSA (obstructive sleep apnea)    Pulmonary fibrosis (HCC)    per [facility] new patient packet   Past Surgical History:  Procedure Laterality Date   APPENDECTOMY     COLON SURGERY     perforated bowel and hernia repair   COLONOSCOPY  2024   per [facility] new patient packet   COLONOSCOPY N/A 05/17/2024   Procedure: COLONOSCOPY;  Surgeon: Legrand Victory LITTIE DOUGLAS, MD;  Location: [facility];  Service: Gastroenterology;  Laterality: N/A;   HEMOSTASIS CLIP PLACEMENT  05/17/2024   Procedure: CONTROL OF HEMORRHAGE, GI TRACT, ENDOSCOPIC, BY CLIPPING OR OVERSEWING;  Surgeon: Legrand Victory LITTIE DOUGLAS, MD;  Location: [facility];  Service: Gastroenterology;;   ILIAC ARTERY ANEURYSM REPAIR Left 2006   Dr. Oris (redo surgery)   POLYPECTOMY  05/17/2024   Procedure: POLYPECTOMY, INTESTINE;  Surgeon: Legrand Victory LITTIE DOUGLAS, MD;  Location: [facility];  Service: Gastroenterology;;   REPLACEMENT TOTAL KNEE Left 2008   Dr.Yeats, per [facility] new patient packet   REPLACEMENT TOTAL KNEE Right 2018   Dr.J, per [facility] new patient packet   REPLACEMENT TOTAL KNEE BILATERAL     RIGHT HEART CATH N/A 09/09/2023   Procedure: RIGHT HEART CATH;  Surgeon: Rolan Ezra RAMAN, MD;  Location: [facility] INVASIVE CV LAB;  Service: Cardiovascular;  Laterality: N/A;   TONSILLECTOMY     age 37   Social History:   reports that he quit smoking about 37 years ago. His smoking use included cigarettes. He started smoking about 60 years ago. He has a 46 pack-year smoking history. He has been exposed to tobacco smoke. He has never used smokeless tobacco. He reports that he does not currently use alcohol after a past usage of about 4.0 - 5.0 standard drinks of alcohol per week. He reports that he does not use drugs.  Family History  Problem Relation Age of Onset   Parkinson's disease Mother    Heart attack Father    Stroke Father    Alzheimer's disease Father     Alzheimer's disease Sister    Other Sister        degenerative muscle disease   Hypertension Daughter    Obesity Daughter    Heart murmur Daughter    Colon cancer Neg Hx    Esophageal cancer Neg Hx    Rectal cancer Neg Hx    Stomach cancer Neg Hx     Medications: Patient's Medications  New Prescriptions   No medications on file  Previous Medications   AMLODIPINE  (NORVASC ) 5 MG TABLET    TAKE 1 TABLET (5 MG TOTAL) BY MOUTH DAILY.   ASPIRIN  EC 81 MG TABLET    Take 81 mg by mouth daily.   ATENOLOL  (TENORMIN ) 50 MG TABLET    Take 1.5 tablets (75 mg total) by mouth daily.   COENZYME Q10 (COQ10 PO)    Take 500 mg by mouth daily.   FIBER PO    Take 4 capsules by mouth 2 (two) times daily.  FLAXSEED, LINSEED, (FLAXSEED OIL PO)    Take 1 capsule by mouth daily.   FLUTICASONE  (FLONASE ) 50 MCG/ACT NASAL SPRAY    Place 2 sprays into both nostrils daily.   GUAIFENESIN  (MUCINEX ) 600 MG 12 HR TABLET    Take 1 tablet (600 mg total) by mouth 2 (two) times daily.   HYDROCHLOROTHIAZIDE  (HYDRODIURIL ) 25 MG TABLET    TAKE 1 TABLET BY MOUTH EVERY DAY   IPRATROPIUM-ALBUTEROL  (DUONEB) 0.5-2.5 (3) MG/3ML SOLN    Take 3 mLs by nebulization every 6 (six) hours as needed (shortness of breath).   LISINOPRIL  (ZESTRIL ) 40 MG TABLET    Take 1 tablet (40 mg total) by mouth daily.   LORATADINE  (CLARITIN ) 10 MG TABLET    Take 10 mg by mouth daily as needed for allergies.   MAGNESIUM PO    Take 2 tablets by mouth daily.   NAPROXEN  (NAPROSYN ) 500 MG TABLET    Take 1 tablet (500 mg total) by mouth 2 (two) times daily as needed (as needed for pain).   OMEPRAZOLE  (PRILOSEC) 20 MG CAPSULE    TAKE 1 CAPSULE BY MOUTH EVERY DAY   PIRFENIDONE  801 MG TABS    Take 1 tablet (801 mg total) by mouth 3 (three) times daily with meals.   ROSUVASTATIN  (CRESTOR ) 10 MG TABLET    TAKE 1 TABLET BY MOUTH EVERY DAY   TADALAFIL  (CIALIS ) 20 MG TABLET    Take 1 tablet (20 mg total) by mouth daily as needed.   TURMERIC-GINGER PO    Take  2,600 mg by mouth daily.  Modified Medications   No medications on file  Discontinued Medications   No medications on file    Physical Exam:  Vitals:   11/21/24 0931 11/21/24 0933  BP: (!) 140/72 136/72  Pulse: 75   Temp: 97.7 F (36.5 C)   SpO2: 98%   Weight: 209 lb 3.2 oz (94.9 kg)   Height: 5' 7.5 (1.715 m)    Body mass index is 32.28 kg/m. Wt Readings from Last 3 Encounters:  11/21/24 209 lb 3.2 oz (94.9 kg)  10/17/24 208 lb 9.6 oz (94.6 kg)  09/28/24 206 lb 3.2 oz (93.5 kg)    Physical Exam Constitutional:      General: He is not in acute distress.    Appearance: He is well-developed. He is not diaphoretic.  HENT:     Head: Normocephalic and atraumatic.     Right Ear: External ear normal.     Left Ear: External ear normal.     Mouth/Throat:     Pharynx: No oropharyngeal exudate.  Eyes:     Conjunctiva/sclera: Conjunctivae normal.     Pupils: Pupils are equal, round, and reactive to light.  Cardiovascular:     Rate and Rhythm: Normal rate and regular rhythm.     Heart sounds: Normal heart sounds.  Pulmonary:     Effort: Pulmonary effort is normal.     Breath sounds: Normal breath sounds.  Abdominal:     General: Bowel sounds are normal.     Palpations: Abdomen is soft.  Musculoskeletal:        General: No tenderness.     Cervical back: Normal range of motion and neck supple.     Right lower leg: No edema.     Left lower leg: No edema.  Skin:    General: Skin is warm and dry.  Neurological:     Mental Status: He is alert and oriented to person, place, and  time.     Labs reviewed: Basic Metabolic Panel: Recent Labs    09/04/24 0231 09/05/24 0236 09/06/24 0309  NA 132* 132* 131*  K 4.4 4.6 4.7  CL 96* 96* 96*  CO2 27 27 27   GLUCOSE 130* 128* 120*  BUN 10 10 9   CREATININE 0.50* 0.53* 0.54*  CALCIUM  8.6* 8.7* 8.7*  MG 1.9 2.0 1.9   Liver Function Tests: Recent Labs    03/02/24 1519 06/11/24 1459 08/30/24 0555  AST 23 23 23   ALT 22  19 19   ALKPHOS  --   --  47  BILITOT 0.5 0.5 0.9  PROT 6.9 7.0 6.1*  ALBUMIN  --   --  3.2*   No results for input(s): LIPASE, AMYLASE in the last 8760 hours. No results for input(s): AMMONIA in the last 8760 hours. CBC: Recent Labs    03/02/24 1519 08/22/24 0500 09/04/24 0231 09/05/24 0236 09/06/24 0309  WBC 10.7   < > 12.5* 12.7* 12.4*  NEUTROABS 7,886*  --   --   --   --   HGB 14.7   < > 13.5 13.7 13.7  HCT 44.2   < > 41.1 41.6 41.0  MCV 82.9   < > 82.0 81.9 82.0  PLT 196   < > 190 203 201   < > = values in this interval not displayed.   Lipid Panel: Recent Labs    01/18/24 0948 03/02/24 1519  CHOL 123 118  HDL 32* 36*  LDLCALC 70 56  TRIG 116 191*  CHOLHDL 3.8 3.3   TSH: No results for input(s): TSH in the last 8760 hours. A1C: Lab Results  Component Value Date   HGBA1C 6.6 (H) 06/11/2024     Assessment/Plan Assessment and Plan Assessment & Plan Idiopathic pulmonary fibrosis Recent pneumothorax treated with chest tube. Oxygen desaturation with activity on 3L O2. - Continue oxygen therapy at 3L. - Consult pulmonologist for improved home oxygen machine. - Attend pulmonologist appointment for stress test.  Type 2 diabetes mellitus with circulatory complications Diabetes controlled with diet. Chest pressure post-meals may indicate glucose intolerance. - Continue dietary management. - Consider dietary journal for food intolerances.  Essential hypertension Blood pressure controlled. Low sodium possibly due to hydrochlorothiazide . - Continue lisinopril , amlodipine , atenolol , hydrochlorothiazide . - Monitor sodium levels, adjust hydrochlorothiazide  if needed.  Mixed hyperlipidemia Managed with Crestor . - Rechecked cholesterol levels today.  Gastroesophageal reflux disease Symptoms controlled with omeprazole . - Continue omeprazole . - Consider dietary journal for food intolerances.  Benign prostatic hyperplasia with lower urinary tract  symptoms Frequent urination, mild prostate enlargement, symptoms minor. - Continue current management.  Obstructive sleep apnea Not using CPAP post-pneumothorax, feels rested. - Defer CPAP use to pulmonologist's recommendations.  Return in about 4 months (around 03/21/2025) for routine follow up.  Blimy Napoleon K. Caro BODILY [facility] [phone]     [1] No Known Allergies  "

## 2024-11-21 NOTE — Patient Instructions (Signed)
 Can obtain shingles and covid vaccine at local pharmacy.

## 2024-11-22 LAB — COMPREHENSIVE METABOLIC PANEL WITH GFR
AG Ratio: 1.6 (calc) (ref 1.0–2.5)
ALT: 18 U/L (ref 9–46)
AST: 21 U/L (ref 10–35)
Albumin: 4.2 g/dL (ref 3.6–5.1)
Alkaline phosphatase (APISO): 75 U/L (ref 35–144)
BUN/Creatinine Ratio: 27 (calc) — ABNORMAL HIGH (ref 6–22)
BUN: 13 mg/dL (ref 7–25)
CO2: 29 mmol/L (ref 20–32)
Calcium: 9.2 mg/dL (ref 8.6–10.3)
Chloride: 95 mmol/L — ABNORMAL LOW (ref 98–110)
Creat: 0.49 mg/dL — ABNORMAL LOW (ref 0.70–1.28)
Globulin: 2.6 g/dL (ref 1.9–3.7)
Glucose, Bld: 241 mg/dL — ABNORMAL HIGH (ref 65–139)
Potassium: 4 mmol/L (ref 3.5–5.3)
Sodium: 133 mmol/L — ABNORMAL LOW (ref 135–146)
Total Bilirubin: 0.5 mg/dL (ref 0.2–1.2)
Total Protein: 6.8 g/dL (ref 6.1–8.1)
eGFR: 106 mL/min/1.73m2

## 2024-11-22 LAB — CBC WITH DIFFERENTIAL/PLATELET
Absolute Lymphocytes: 1009 {cells}/uL (ref 850–3900)
Absolute Monocytes: 634 {cells}/uL (ref 200–950)
Basophils Absolute: 73 {cells}/uL (ref 0–200)
Basophils Relative: 0.7 %
Eosinophils Absolute: 374 {cells}/uL (ref 15–500)
Eosinophils Relative: 3.6 %
HCT: 45.7 % (ref 39.4–51.1)
Hemoglobin: 15 g/dL (ref 13.2–17.1)
MCH: 27.5 pg (ref 27.0–33.0)
MCHC: 32.8 g/dL (ref 31.6–35.4)
MCV: 83.9 fL (ref 81.4–101.7)
MPV: 9 fL (ref 7.5–12.5)
Monocytes Relative: 6.1 %
Neutro Abs: 8310 {cells}/uL — ABNORMAL HIGH (ref 1500–7800)
Neutrophils Relative %: 79.9 %
Platelets: 197 Thousand/uL (ref 140–400)
RBC: 5.45 Million/uL (ref 4.20–5.80)
RDW: 13 % (ref 11.0–15.0)
Total Lymphocyte: 9.7 %
WBC: 10.4 Thousand/uL (ref 3.8–10.8)

## 2024-11-22 LAB — MICROALBUMIN / CREATININE URINE RATIO
Creatinine, Urine: 57 mg/dL (ref 20–320)
Microalb Creat Ratio: 12 mg/g{creat}
Microalb, Ur: 0.7 mg/dL

## 2024-11-22 LAB — HEMOGLOBIN A1C
Hgb A1c MFr Bld: 6.6 % — ABNORMAL HIGH
Mean Plasma Glucose: 143 mg/dL
eAG (mmol/L): 7.9 mmol/L

## 2024-11-22 LAB — LIPID PANEL
Cholesterol: 117 mg/dL
HDL: 39 mg/dL — ABNORMAL LOW
LDL Cholesterol (Calc): 55 mg/dL
Non-HDL Cholesterol (Calc): 78 mg/dL
Total CHOL/HDL Ratio: 3 (calc)
Triglycerides: 145 mg/dL

## 2024-11-23 ENCOUNTER — Ambulatory Visit: Payer: Self-pay | Admitting: Nurse Practitioner

## 2024-11-26 ENCOUNTER — Ambulatory Visit (INDEPENDENT_AMBULATORY_CARE_PROVIDER_SITE_OTHER)

## 2024-11-26 ENCOUNTER — Ambulatory Visit: Admitting: Adult Health

## 2024-11-26 ENCOUNTER — Encounter: Payer: Self-pay | Admitting: Adult Health

## 2024-11-26 VITALS — BP 150/74 | HR 89 | Temp 98.5°F | Ht 67.5 in | Wt 208.6 lb

## 2024-11-26 DIAGNOSIS — J84112 Idiopathic pulmonary fibrosis: Secondary | ICD-10-CM | POA: Diagnosis not present

## 2024-11-26 DIAGNOSIS — Z8709 Personal history of other diseases of the respiratory system: Secondary | ICD-10-CM

## 2024-11-26 DIAGNOSIS — G4733 Obstructive sleep apnea (adult) (pediatric): Secondary | ICD-10-CM | POA: Diagnosis not present

## 2024-11-26 DIAGNOSIS — J9611 Chronic respiratory failure with hypoxia: Secondary | ICD-10-CM

## 2024-11-26 DIAGNOSIS — R140 Abdominal distension (gaseous): Secondary | ICD-10-CM | POA: Diagnosis not present

## 2024-11-26 DIAGNOSIS — Z87891 Personal history of nicotine dependence: Secondary | ICD-10-CM | POA: Diagnosis not present

## 2024-11-26 DIAGNOSIS — R053 Chronic cough: Secondary | ICD-10-CM

## 2024-11-26 DIAGNOSIS — R5381 Other malaise: Secondary | ICD-10-CM

## 2024-11-26 DIAGNOSIS — J439 Emphysema, unspecified: Secondary | ICD-10-CM

## 2024-11-26 NOTE — Progress Notes (Unsigned)
 "  @Patient  ID: Vincent Meyers, male    DOB: 02-10-1946, 79 y.o.   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   Chest Imaging:  Cardiac CT 07/25/19 >> mild fibrotic changes at bases Rt > Lt CT chest 08/24/20 >> UIP pattern High-resolution CT chest January 25, 2023 stable fibrotic changes, consistent with UIP.   Sleep Tests:  HST 10/25/20 >> AHI 23.4, SpO2 low 78% Auto CPAP 08/26/21 to 09/24/21 >> used on 27 of 30 nights with average 8 hrs 46 min.  Average AHI 5.5 with median CPAP 7 and 95 th percentile CPAP 11 cm H2O  RHC 09/09/23 normal filling pressure, borderline PA pressure (MAP 20)  Discussed the use of AI scribe software for clinical note transcription with the patient, who gave verbal consent to proceed.  History of Present Illness Vincent Meyers is a 79 year old male with pulmonary fibrosis who presents for a checkup.  He has pulmonary fibrosis with an increased need for oxygen, especially during physical activity. His oxygen levels drop significantly when walking, requiring up to eight liters of oxygen to maintain adequate saturation. He uses two liters of oxygen at rest and three liters while sleeping. He had a pneumothorax in October, which led to the discontinuation of CPAP use. He feels his lung condition has regressed since the pneumothorax, although he has regained some confidence in his lung function over the past few months.  He is  currently taking pirfenidone  for his pulmonary fibrosis and uses a nebulizer with Duoneb as needed, although he feels it does not significantly impact his condition. He also takes lisinopril  for blood pressure management but is concerned about its potential to worsen his cough, which is a symptom of his pulmonary fibrosis.  He experiences significant shortness of breath during physical activity. His current oxygen concentrator at home only goes up to three liters, which is insufficient for his needs, and he is in the process of obtaining a larger unit. He has completed pulmonary rehabilitation in the past and continues to monitor his oxygen levels at home. His oxygen saturation is typically 98% while resting in bed but drops significantly with activity.  He reports experiencing gas and bloating, which he feels impacts his oxygen levels and plans to discuss this with another doctor.  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 He no longer smokes cigarettes.     Allergies[1]  Immunization History  Administered Date(s) Administered   Fluad Quad(high Dose 65+) 08/23/2022   Fluad Trivalent(High Dose 65+) 07/29/2023   INFLUENZA, HIGH DOSE SEASONAL PF 10/24/2019, 09/07/2024   Influenza Split 09/19/2015   Influenza, Quadrivalent, Recombinant, Inj, Pf 08/22/2017   Influenza,inj,Quad PF,6+ Mos 10/11/2016, 10/24/2018   Influenza,inj,Quad PF,6-35 Mos 10/11/2016, 10/24/2018   Influenza,inj,quad, With Preservative 09/19/2015   Influenza,trivalent, recombinat, inj, PF 08/22/2017   PFIZER(Purple Top)SARS-COV-2 Vaccination 01/06/2020, 01/29/2020   Pneumococcal Conjugate-13 05/19/2016   Pneumococcal Polysaccharide-23 09/05/2012   Tdap 03/12/2015   Unspecified SARS-COV-2  Vaccination 08/16/2022   Zoster, Live 11/05/2013    Past Medical History:  Diagnosis Date   Acid reflux    Angina pectoris    Anxiety    Arthritis    Coronary artery disease    Enlarged prostate    per [facility] new  patient packet   Erectile dysfunction    High blood sugar    per [facility] new patient packet   Hyperlipidemia    Hypertension    IBS (irritable bowel syndrome)    ILD (interstitial lung disease) (HCC)    OSA (obstructive sleep apnea)    Pulmonary fibrosis (HCC)    per [facility] new patient packet    Tobacco History: Tobacco Use History[2] Counseling given: Not Answered   Outpatient Medications Prior to Visit  Medication Sig Dispense Refill   amLODipine  (NORVASC ) 5 MG tablet TAKE 1 TABLET (5 MG TOTAL) BY MOUTH DAILY. 90 tablet 3   aspirin  EC 81 MG tablet Take 81 mg by mouth daily.     atenolol  (TENORMIN ) 50 MG tablet Take 1.5 tablets (75 mg total) by mouth daily. 135 tablet 1   Coenzyme Q10 (COQ10 PO) Take 500 mg by mouth daily.     FIBER PO Take 4 capsules by mouth 2 (two) times daily.     Flaxseed, Linseed, (FLAXSEED OIL PO) Take 1 capsule by mouth daily.     fluticasone  (FLONASE ) 50 MCG/ACT nasal spray Place 2 sprays into both nostrils daily.     guaiFENesin  (MUCINEX ) 600 MG 12 hr tablet Take 1 tablet (600 mg total) by mouth 2 (two) times daily. (Patient taking differently: Take 600 mg by mouth daily.) 60 tablet 0   hydrochlorothiazide  (HYDRODIURIL ) 25 MG tablet TAKE 1 TABLET BY MOUTH EVERY DAY 90 tablet 1   ipratropium-albuterol  (DUONEB) 0.5-2.5 (3) MG/3ML SOLN Take 3 mLs by nebulization every 6 (six) hours as needed (shortness of breath). 360 mL 0   lisinopril  (ZESTRIL ) 40 MG tablet Take 1 tablet (40 mg total) by mouth daily. 90 tablet 1   loratadine  (CLARITIN ) 10 MG tablet Take 10 mg by mouth daily as needed for allergies.     MAGNESIUM PO Take 2 tablets by mouth daily.     naproxen  (NAPROSYN ) 500 MG tablet Take 1 tablet (500 mg total) by mouth 2 (two) times daily as needed (as needed for pain). (Patient not taking: Reported on 11/26/2024) 15 tablet 0   omeprazole  (PRILOSEC) 20 MG capsule TAKE 1 CAPSULE BY MOUTH EVERY DAY 90 capsule 1   Pirfenidone  801 MG TABS Take 1 tablet (801 mg total)  by mouth 3 (three) times daily with meals. 270 tablet 5   rosuvastatin  (CRESTOR ) 10 MG tablet TAKE 1 TABLET BY MOUTH EVERY DAY 90 tablet 3   tadalafil  (CIALIS ) 20 MG tablet Take 1 tablet (20 mg total) by mouth daily as needed. 10 tablet 11   TURMERIC-GINGER PO Take 2,600 mg by mouth daily.     No facility-administered medications prior to visit.     Review of Systems:   Constitutional:   No  weight loss, night sweats,  Fevers, chills, fatigue, or  lassitude.  HEENT:   No headaches,  Difficulty swallowing,  Tooth/dental problems, or  Sore throat,                No sneezing, itching, ear ache, nasal congestion, post nasal drip,   CV:  No chest pain,  Orthopnea, PND, swelling in lower extremities, anasarca, dizziness, palpitations, syncope.   GI  No heartburn,  indigestion, abdominal pain, nausea, vomiting, diarrhea, change in bowel habits, loss of appetite, bloody stools.   Resp: No shortness of breath with exertion or at rest.  No excess mucus, no productive cough,  No non-productive cough,  No coughing up of blood.  No change in color of mucus.  No wheezing.  No chest wall deformity  Skin: no rash or lesions.  GU: no dysuria, change in color of urine, no urgency or frequency.  No flank pain, no hematuria   MS:  No joint pain or swelling.  No decreased range of motion.  No back pain.    Physical Exam  There were no vitals taken for this visit.  GEN: A/Ox3; pleasant , NAD, well nourished    HEENT:  [place]/AT,  EACs-clear, TMs-wnl, NOSE-clear, THROAT-clear, no lesions, no postnasal drip or exudate noted.   NECK:  Supple w/ fair ROM; no JVD; normal carotid impulses w/o bruits; no thyromegaly or nodules palpated; no lymphadenopathy.    RESP  Clear  P & A; w/o, wheezes/ rales/ or rhonchi. no accessory muscle use, no dullness to percussion  CARD:  RRR, no m/r/g, no peripheral edema, pulses intact, no cyanosis or clubbing.  GI:   Soft & nt; nml bowel sounds; no organomegaly or masses  detected.   Musco: Warm bil, no deformities or joint swelling noted.   Neuro: alert, no focal deficits noted.    Skin: Warm, no lesions or rashes    Lab Results:Reviewed 11/26/2024   CBC    Component Value Date/Time   WBC 10.4 11/21/2024 1007   RBC 5.45 11/21/2024 1007   HGB 15.0 11/21/2024 1007   HGB 14.6 11/18/2022 1104   HCT 45.7 11/21/2024 1007   HCT 42.9 11/18/2022 1104   PLT 197 11/21/2024 1007   PLT 228 11/18/2022 1104   MCV 83.9 11/21/2024 1007   MCV 80 11/18/2022 1104   MCH 27.5 11/21/2024 1007   MCHC 32.8 11/21/2024 1007   RDW 13.0 11/21/2024 1007   RDW 12.3 11/18/2022 1104   LYMPHSABS 1.6 04/01/2023 1347   LYMPHSABS 1.6 11/18/2022 1104   MONOABS 1.1 (H) 04/01/2023 1347   EOSABS 374 11/21/2024 1007   EOSABS 0.4 11/18/2022 1104   BASOSABS 73 11/21/2024 1007   BASOSABS 0.1 11/18/2022 1104    BMET    Component Value Date/Time   NA 133 (L) 11/21/2024 1007   NA 134 10/31/2023 1504   K 4.0 11/21/2024 1007   CL 95 (L) 11/21/2024 1007   CO2 29 11/21/2024 1007   GLUCOSE 241 (H) 11/21/2024 1007   BUN 13 11/21/2024 1007   BUN 10 10/31/2023 1504   CREATININE 0.49 (L) 11/21/2024 1007   CALCIUM  9.2 11/21/2024 1007   GFRNONAA >60 09/06/2024 0309   GFRAA 115 07/16/2019 1120    BNP No results found for: BNP  ProBNP No results found for: PROBNP  Imaging: No results found.  Administration History     None          Latest Ref Rng & Units 11/28/2023   11:34 AM 09/01/2023    1:52 PM 02/01/2023    8:57 AM 11/26/2021    2:58 PM 11/17/2020    2:54 PM  PFT Results  FVC-Pre L 2.67  2.64  2.84  3.27  P 3.28   FVC-Predicted Pre % 68  68  74  83  P 82   FVC-Post L   2.87  3.31  P 3.16   FVC-Predicted Post %   75  83  P 79   Pre FEV1/FVC % % 87  85  72  68  P 83   Post FEV1/FCV % %   69  87  P 85   FEV1-Pre L 2.33  2.26  2.04  2.22  P 2.73   FEV1-Predicted Pre % 83  81  74  77  P 94   FEV1-Post L   1.97  2.89  P 2.69   DLCO uncorrected ml/min/mmHg   13.35  14.61  15.89  P 16.02   DLCO UNC% %  56  62  66  P 67   DLCO corrected ml/min/mmHg  13.35  14.61  15.89  P 16.02   DLCO COR %Predicted %  56  62  66  P 67   DLVA Predicted %  81  89  94  P 88   TLC L  4.21  4.50  4.78  P 4.36   TLC % Predicted %  63  68  72  P 65   RV % Predicted %  61  64  62  P 47     P Preliminary result    No results found for: NITRICOXIDE      No data to display              Assessment & Plan:   Assessment and Plan Assessment & Plan Idiopathic pulmonary fibrosis   He has progressive idiopathic pulmonary fibrosis with increased oxygen requirements during ambulation. Oxygen saturation drops to 86% on room air during exertion, necessitating up to 8 liters of supplemental oxygen. Symptoms include dyspnea and reduced exercise tolerance. Although the previous CT scan in October showed no significant changes in fibrosis, oxygen needs have increased. He is on pirfenidone  and uses Duoneb as needed. 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 He is interested in clinical trials and is on a list for future studies. Ordered a chest x-ray to assess current pulmonary status. Prescribed a new oxygen concentrator capable of delivering up to 8 liters. Continue pirfenidone  and Duoneb as needed. Will discuss potential participation in clinical trials with the pulmonology team.  History of pneumothorax   He had a pneumothorax in October, likely related to underlying pulmonary fibrosis and structural lung changes. Currently, there is no active pneumothorax, but increased oxygen needs suggest possible progression of fibrosis. CPAP use is avoided due to the risk of pneumothorax recurrence, although the risk is low. Continue to avoid CPAP use and monitor for signs of pneumothorax recurrence.  Obstructive sleep apnea   He has moderate obstructive sleep apnea, previously managed with CPAP. CPAP use is currently avoided due to the risk of pneumothorax recurrence. He  reports no significant difference in sleep quality with or without CPAP. Oxygen saturation at night is generally adequate with 3 liters of supplemental oxygen. Continue to avoid CPAP use and monitor sleep quality and oxygen saturation at night.        Khye Hochstetler, NP 11/26/2024  I spent   minutes dedicated to the care of this patient on the date of this encounter to include pre-visit review of records, face-to-face time with the patient discussing conditions above, post visit ordering of testing, clinical documentation with the electronic health record, making appropriate referrals as documented, and communicating necessary findings to members of the patients care team.      [1] No Known Allergies [2]  Social History Tobacco Use  Smoking Status Former   Current packs/day: 0.00   Average packs/day:  2.0 packs/day for 23.0 years (46.0 ttl pk-yrs)   Types: Cigarettes   Start date: 36   Quit date: 1989   Years since quitting: 37.0   Passive exposure: Past  Smokeless Tobacco Never   "

## 2024-11-26 NOTE — Patient Instructions (Addendum)
 Continue on Esbriet  (Pirfenidone )Three times a day.  Activity as tolerated.  Albuterol  inhaler or Duoneb every 6hr as needed.  Continue on Oxygen 2l/m rest and 3l/m at bedtime.  Use Oxygen up to 8l/m with activity to keep O2 sats >88-90% Order to DME for new Oxygen Concentrator Chest xray today.  Saline nasal rinses and saline nasal gel As needed   Work on healthy weight weight  Gas X as needed.  Follow up with Dr. Geronimo next month as planned and As needed

## 2024-11-28 ENCOUNTER — Telehealth: Payer: Self-pay

## 2024-11-28 ENCOUNTER — Ambulatory Visit: Payer: Self-pay | Admitting: Adult Health

## 2024-11-28 NOTE — Telephone Encounter (Signed)
 Referral order was placed day of visit, does [facility] not have the order ?

## 2024-11-28 NOTE — Telephone Encounter (Signed)
 Copied from CRM [id number]. Topic: Clinical - Order For Equipment >> Nov 26, 2024  1:37 PM [name] wrote: Reason for CRM: Patient forgot to ask for a written prescription for his oxygen concentrator - states [facility] is requiring a written prescription. Requesting a call back once ready for pick up.   Callback number: [phone]  Patient states he got his machine after he had called    - NFN

## 2024-12-03 ENCOUNTER — Other Ambulatory Visit: Payer: Self-pay | Admitting: Orthopedic Surgery

## 2024-12-03 DIAGNOSIS — I10 Essential (primary) hypertension: Secondary | ICD-10-CM

## 2024-12-07 ENCOUNTER — Other Ambulatory Visit (HOSPITAL_COMMUNITY): Payer: Self-pay

## 2024-12-11 ENCOUNTER — Other Ambulatory Visit (HOSPITAL_COMMUNITY): Payer: Self-pay

## 2024-12-13 ENCOUNTER — Other Ambulatory Visit: Payer: Self-pay

## 2024-12-13 NOTE — Progress Notes (Signed)
 Specialty Pharmacy Refill Coordination Note  Vincent Meyers is a 79 y.o. male contacted today regarding refills of specialty medication(s) Pirfenidone    Patient requested Delivery   Delivery date: 12/14/24   Verified address: [address] [place] [address]   Medication will be filled on: 12/13/24  Patient aware of $316.75 copay

## 2024-12-18 ENCOUNTER — Ambulatory Visit (INDEPENDENT_AMBULATORY_CARE_PROVIDER_SITE_OTHER): Admitting: Otolaryngology

## 2024-12-18 ENCOUNTER — Encounter (INDEPENDENT_AMBULATORY_CARE_PROVIDER_SITE_OTHER): Payer: Self-pay | Admitting: Otolaryngology

## 2024-12-18 VITALS — HR 78 | Ht 67.5 in | Wt 205.0 lb

## 2024-12-18 DIAGNOSIS — J342 Deviated nasal septum: Secondary | ICD-10-CM | POA: Diagnosis not present

## 2024-12-18 DIAGNOSIS — H6123 Impacted cerumen, bilateral: Secondary | ICD-10-CM | POA: Diagnosis not present

## 2024-12-18 DIAGNOSIS — R0981 Nasal congestion: Secondary | ICD-10-CM | POA: Diagnosis not present

## 2024-12-18 DIAGNOSIS — J343 Hypertrophy of nasal turbinates: Secondary | ICD-10-CM

## 2024-12-18 DIAGNOSIS — H6691 Otitis media, unspecified, right ear: Secondary | ICD-10-CM

## 2024-12-18 DIAGNOSIS — H903 Sensorineural hearing loss, bilateral: Secondary | ICD-10-CM

## 2024-12-18 DIAGNOSIS — H6981 Other specified disorders of Eustachian tube, right ear: Secondary | ICD-10-CM

## 2024-12-18 DIAGNOSIS — H9311 Tinnitus, right ear: Secondary | ICD-10-CM

## 2024-12-18 DIAGNOSIS — J310 Chronic rhinitis: Secondary | ICD-10-CM

## 2024-12-18 DIAGNOSIS — H9313 Tinnitus, bilateral: Secondary | ICD-10-CM

## 2024-12-18 MED ORDER — FLUTICASONE PROPIONATE 50 MCG/ACT NA SUSP: 2.0000 | Freq: Every day | NASAL | 10 refills | Status: AC

## 2024-12-18 MED ORDER — GUAIFENESIN ER 600 MG PO TB12: 600.0000 mg | ORAL_TABLET | Freq: Two times a day (BID) | ORAL | 6 refills | Status: AC

## 2024-12-18 NOTE — Progress Notes (Signed)
 Patient ID: RENALDO Meyers, male   DOB: 06/01/1946, 79 y.o.   MRN: [id number]  Follow up: Right ear eustachian tube dysfunction, hearing loss, tinnitus, chronic nasal congestion   History of Present Illness Vincent Meyers is a 79 year old male with chronic right ear serous otitis media and bilateral sensorineural hearing loss who presents for follow-up regarding persistent right ear congestion, hearing loss, and chronic nasal congestion.  He reports persistent right ear congestion and hearing loss, both of which have worsened since his last visit over a year ago. The right ear is predominantly affected, with significant difficulty hearing and chronic tinnitus that has been longstanding and refractory to prior interventions, including hearing aids. He has adapted to the persistent tinnitus. He experiences intermittent mild otalgia in the right ear but denies frequent or severe pain. He notes substantial cerumen accumulation in both ears. He has been advised to avoid Valsalva maneuvers due to risk of pulmonary complications and is currently not using CPAP therapy.  He also experiences chronic nasal and sinus congestion, which has remained stable since his last visit. He uses intranasal fluticasone  daily and guaifenesin  600 mg twice daily for symptom management, though he finds the cost of guaifenesin  prohibitive and requests a prescription. Oxygen therapy contributes to nasal dryness, and he occasionally experiences mild, infrequent epistaxis. At night, he often resorts to mouth breathing due to difficulty maintaining oral closure and increased nasal dryness. He denies significant nasal obstruction.  Exam: General: Communicates without difficulty, well nourished, no acute distress. Head: Normocephalic, no evidence injury, no tenderness, facial buttresses intact without stepoff. Face/sinus: No tenderness to palpation and percussion. Facial movement is normal and symmetric. Eyes: PERRL, EOMI. No scleral  icterus, conjunctivae clear. Neuro: CN II exam reveals vision grossly intact.  No nystagmus at any point of gaze. Ears: Auricles well formed without lesions.  Bilateral cerumen impaction.  Nose: External evaluation reveals normal support and skin without lesions.  Dorsum is intact.  Anterior rhinoscopy reveals congested mucosa over anterior aspect of inferior turbinates and intact septum.  No purulence noted. Oral:  Oral cavity and oropharynx are intact, symmetric, without erythema or edema.  Mucosa is moist without lesions. Neck: Full range of motion without pain.  There is no significant lymphadenopathy.  No masses palpable.  Thyroid bed within normal limits to palpation.  Parotid glands and submandibular glands equal bilaterally without mass.  Trachea is midline. Neuro:  CN 2-12 grossly intact.   Procedure: Bilateral cerumen disimpaction Anesthesia: None Description: Under the operating microscope, the cerumen is carefully removed with a combination of cerumen currette, alligator forceps, and suction catheters.  After the cerumen is removed, the TMs are noted to be intact, with partial right middle ear effusion.  No mass, erythema, or lesions. The patient tolerated the procedure well.    Procedure:  Flexible Nasal Endoscopy: Description: Risks, benefits, and alternatives of flexible endoscopy were explained to the patient.  Specific mention was made of the risk of throat numbness with difficulty swallowing, possible bleeding from the nose and mouth, and pain from the procedure.  The patient gave oral consent to proceed.  The flexible scope was inserted into the right nasal cavity.  Endoscopy of the interior nasal cavity, superior, inferior, and middle meatus was performed. The sphenoid-ethmoid recess was examined. Edematous mucosa was noted.  No polyp, mass, or lesion was appreciated. Nasal septal deviation noted. Olfactory cleft was clear.  Nasopharynx was clear.  Turbinates were hypertrophied but without  mass.  The procedure  was repeated on the contralateral side with similar findings.  The patient tolerated the procedure well.   Assessment & Plan Right ear chronic serous otitis media with eustachian tube dysfunction and cerumen impaction He has chronic right ear congestion, hearing loss, and intermittent otalgia with progressive symptoms over the past year. Bilateral cerumen impaction and persistent right middle ear effusion are consistent with chronic serous otitis media and eustachian tube dysfunction. Valsalva maneuvers are contraindicated due to pulmonary fibrosis and risk of pneumothorax. Tympanostomy tube placement was discussed as a potential intervention to relieve middle ear effusion and avoid Valsalva maneuvers.  - Removed cerumen from both ears during the visit. - Discussed tympanostomy tube placement in the right ear to relieve middle ear effusion and avoid Valsalva maneuvers. - Continued Flonase  (fluticasone ) nasal spray.  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 Previous hearing aid trial was unsatisfactory.  - The strategies to cope with tinnitus, including the use of masker, hearing aids, tinnitus retraining therapy, and avoidance of caffeine and alcohol are discussed.  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 He experiences occasional mild epistaxis but no significant nasal obstruction or mass. - Continued Flonase  (fluticasone ) nasal spray daily. - Prescribed Mucinex  (guaifenesin ) 600 mg twice daily. - Advised continued use of oxygen as prescribed and mouth breathing at night to reduce nasal dryness and risk of epistaxis. - Scheduled follow-up in one year.

## 2024-12-21 ENCOUNTER — Telehealth: Payer: Self-pay | Admitting: Internal Medicine

## 2024-12-21 NOTE — Telephone Encounter (Signed)
 Patient called about message below, Is it ok switch to virtual or no?  Copied from CRM #[id number]. Topic: Appointments - Appointment Cancel/Reschedule >> Dec 21, 2024 11:03 AM Rilla B wrote: Patient has appt for Monday @ 11:30. Concerned about the weather but really wants to see Ramaswamy.  States the cold air hurts his lung. My first availability is June 2026. Patient does not want to wait. Is virtual an option for Monday? Please call patient to discuss options @ [phone].

## 2024-12-21 NOTE — Telephone Encounter (Signed)
 Ok for virtual. Als sendint to [place] and Con - I am sure we can get him a 48min/30 min next few weeks

## 2024-12-21 NOTE — Telephone Encounter (Signed)
 Would a virtual visit be okay for this patient?double checking before switching

## 2024-12-24 ENCOUNTER — Telehealth: Payer: Self-pay | Admitting: Internal Medicine

## 2024-12-24 ENCOUNTER — Ambulatory Visit: Admitting: Internal Medicine

## 2024-12-24 ENCOUNTER — Other Ambulatory Visit (HOSPITAL_COMMUNITY): Payer: Self-pay

## 2024-12-24 ENCOUNTER — Telehealth: Admitting: Internal Medicine

## 2024-12-24 DIAGNOSIS — J8410 Pulmonary fibrosis, unspecified: Secondary | ICD-10-CM

## 2024-12-24 DIAGNOSIS — J849 Interstitial pulmonary disease, unspecified: Secondary | ICD-10-CM | POA: Diagnosis not present

## 2024-12-24 DIAGNOSIS — Z87891 Personal history of nicotine dependence: Secondary | ICD-10-CM | POA: Diagnosis not present

## 2024-12-24 DIAGNOSIS — J9611 Chronic respiratory failure with hypoxia: Secondary | ICD-10-CM

## 2024-12-24 DIAGNOSIS — R0609 Other forms of dyspnea: Secondary | ICD-10-CM

## 2024-12-24 DIAGNOSIS — Z5181 Encounter for therapeutic drug level monitoring: Secondary | ICD-10-CM

## 2024-12-24 DIAGNOSIS — G4733 Obstructive sleep apnea (adult) (pediatric): Secondary | ICD-10-CM | POA: Diagnosis not present

## 2024-12-24 DIAGNOSIS — J84112 Idiopathic pulmonary fibrosis: Secondary | ICD-10-CM | POA: Diagnosis not present

## 2024-12-24 DIAGNOSIS — J439 Emphysema, unspecified: Secondary | ICD-10-CM

## 2024-12-24 NOTE — Telephone Encounter (Signed)
 Spoke with patient, will do virtual visit at 11:30 12/24/2024. Appointment has been updated.

## 2024-12-24 NOTE — Telephone Encounter (Signed)
 Dalton et al.,  LOIS OSTROM - Prior RHC Oct 2024  with mPAP 20 by Ezra. He is now progrssively worse. Please do repeat RHC for standard of care. Please note he did have Rt PTX admission oct 2025  Next few weeks is ideal  Thanks  MR

## 2024-12-24 NOTE — Patient Instructions (Addendum)
"    ICD-10-CM   1. ILD (interstitial lung disease) (HCC)  J84.9 DG Chest 2 View    2. IPF (idiopathic pulmonary fibrosis) (HCC)  J84.112 DG Chest 2 View    3. Hospital discharge follow-up  Z09 DG Chest 2 View    4. History of pneumothorax  Z87.09 DG Chest 2 View    5. 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 OSA (obstructive sleep apnea)  G47.33       #Hospital admission for pneumothorax right side secondary to bulla - Oct 2025  - Chest x-ray today Jan 2026 no recurrence  Plan - clinical monitoring; risk for recurrence remains - if recurs needs pleurodesis  #IPF chronic hypoxemic respiratory failure (PE ruled out Oct 2025 via CTA)  -appears progressive based on history - needing 8L with exertion  Plan - get cbc, bmet, lft, bnp, d-dimer this week of 12/24/2024 - Continue pirfenidone  per schedule - ADD JASCAYD full dose for progression IPF - get HRCT next few days to several days - - Continue oxygen as before  #Sleep apnea  -Noted you are off CPAP given Oct 025 recent admission for pneumothorax  Plan - Ok to restart cPAP 12/24/2024     Follow-up - Return to see nurse practitioner or Dr Geronimo in 4-6 weeks  "

## 2024-12-24 NOTE — Telephone Encounter (Signed)
 RHC scheduled for 01/03/25. PA sent to pulmonology.instructions given to patient over the phone. Also sent via my chart

## 2024-12-25 ENCOUNTER — Ambulatory Visit: Admitting: Internal Medicine

## 2024-12-25 ENCOUNTER — Other Ambulatory Visit

## 2024-12-25 ENCOUNTER — Telehealth: Payer: Self-pay

## 2024-12-25 DIAGNOSIS — Z5181 Encounter for therapeutic drug level monitoring: Secondary | ICD-10-CM

## 2024-12-25 DIAGNOSIS — J9611 Chronic respiratory failure with hypoxia: Secondary | ICD-10-CM | POA: Diagnosis not present

## 2024-12-25 DIAGNOSIS — G4733 Obstructive sleep apnea (adult) (pediatric): Secondary | ICD-10-CM

## 2024-12-25 DIAGNOSIS — J439 Emphysema, unspecified: Secondary | ICD-10-CM | POA: Diagnosis not present

## 2024-12-25 DIAGNOSIS — J84112 Idiopathic pulmonary fibrosis: Secondary | ICD-10-CM

## 2024-12-25 DIAGNOSIS — R0609 Other forms of dyspnea: Secondary | ICD-10-CM

## 2024-12-25 LAB — CBC WITH DIFFERENTIAL/PLATELET
Basophils Absolute: 0.1 10*3/uL (ref 0.0–0.1)
Basophils Relative: 0.6 % (ref 0.0–3.0)
Eosinophils Absolute: 0.5 10*3/uL (ref 0.0–0.7)
Eosinophils Relative: 4 % (ref 0.0–5.0)
HCT: 43.4 % (ref 39.0–52.0)
Hemoglobin: 14.6 g/dL (ref 13.0–17.0)
Lymphocytes Relative: 8.2 % — ABNORMAL LOW (ref 12.0–46.0)
Lymphs Abs: 0.9 10*3/uL (ref 0.7–4.0)
MCHC: 33.5 g/dL (ref 30.0–36.0)
MCV: 82.1 fl (ref 78.0–100.0)
Monocytes Absolute: 1 10*3/uL (ref 0.1–1.0)
Monocytes Relative: 8.4 % (ref 3.0–12.0)
Neutro Abs: 9.1 10*3/uL — ABNORMAL HIGH (ref 1.4–7.7)
Neutrophils Relative %: 78.8 % — ABNORMAL HIGH (ref 43.0–77.0)
Platelets: 209 10*3/uL (ref 150.0–400.0)
RBC: 5.29 Mil/uL (ref 4.22–5.81)
RDW: 13.8 % (ref 11.5–15.5)
WBC: 11.6 10*3/uL — ABNORMAL HIGH (ref 4.0–10.5)

## 2024-12-25 LAB — COMPREHENSIVE METABOLIC PANEL WITH GFR
ALT: 15 U/L (ref 3–53)
AST: 21 U/L (ref 5–37)
Albumin: 4.3 g/dL (ref 3.5–5.2)
Alkaline Phosphatase: 65 U/L (ref 39–117)
BUN: 10 mg/dL (ref 6–23)
CO2: 30 meq/L (ref 19–32)
Calcium: 9.1 mg/dL (ref 8.4–10.5)
Chloride: 95 meq/L — ABNORMAL LOW (ref 96–112)
Creatinine, Ser: 0.47 mg/dL (ref 0.40–1.50)
GFR: 100.3 mL/min
Glucose, Bld: 179 mg/dL — ABNORMAL HIGH (ref 70–99)
Potassium: 4.1 meq/L (ref 3.5–5.1)
Sodium: 131 meq/L — ABNORMAL LOW (ref 135–145)
Total Bilirubin: 0.4 mg/dL (ref 0.2–1.2)
Total Protein: 7 g/dL (ref 6.0–8.3)

## 2024-12-25 LAB — BRAIN NATRIURETIC PEPTIDE: Pro B Natriuretic peptide (BNP): 16 pg/mL (ref 1.0–100.0)

## 2024-12-25 NOTE — Telephone Encounter (Signed)
 Copied from CRM [id number]. Topic: Clinical - Medical Advice >> Dec 24, 2024 11:45 AM Russell PARAS wrote: Reason for CRM:   Pt reached out to clinic due to issues with VV w/Ramaswamy on 2/2 at 11:30 AM. He reports Dr. Geronimo was not able to hear him during the initiation of call but pt could hear him. He tried to troubleshoot but was unable to fix issue.   Would like to know if they can conduct the visit by telephone, or if he will need to schedule another appt.  CB#  [phone]  Old message    - NFN

## 2024-12-26 LAB — D-DIMER, QUANTITATIVE: D-Dimer, Quant: 0.83 ug{FEU}/mL — ABNORMAL HIGH

## 2024-12-28 ENCOUNTER — Ambulatory Visit: Payer: Self-pay | Admitting: Internal Medicine

## 2024-12-28 ENCOUNTER — Ambulatory Visit (HOSPITAL_BASED_OUTPATIENT_CLINIC_OR_DEPARTMENT_OTHER): Admission: RE | Admit: 2024-12-28 | Source: Ambulatory Visit

## 2024-12-28 ENCOUNTER — Other Ambulatory Visit: Payer: Self-pay

## 2024-12-28 DIAGNOSIS — R0609 Other forms of dyspnea: Secondary | ICD-10-CM

## 2024-12-28 DIAGNOSIS — R7989 Other specified abnormal findings of blood chemistry: Secondary | ICD-10-CM

## 2024-12-28 MED ORDER — IOHEXOL 350 MG/ML SOLN
100.0000 mL | Freq: Once | INTRAVENOUS | Status: AC | PRN
  Administered 2024-12-28: 100 mL via INTRAVENOUS

## 2024-12-28 NOTE — Progress Notes (Signed)
 CTA is scheduled 12/28/24

## 2024-12-28 NOTE — Telephone Encounter (Signed)
" ° ° °  In oct 2025 we rule out PE via CTA but he is having worsening dyspne since then, D-dimer high 0.8. There is a 30% chance he has a PE . BNP normal. So next 2 busines days other 12/28/2024 or 12/31/24 he needs a cTA. If he is getting worse faster then go to ER "

## 2025-01-03 ENCOUNTER — Ambulatory Visit (HOSPITAL_COMMUNITY): Admission: RE | Admit: 2025-01-03 | Admitting: Internal Medicine

## 2025-01-03 ENCOUNTER — Encounter (HOSPITAL_COMMUNITY): Admission: RE | Payer: Self-pay

## 2025-01-04 ENCOUNTER — Other Ambulatory Visit

## 2025-02-12 ENCOUNTER — Encounter (HOSPITAL_BASED_OUTPATIENT_CLINIC_OR_DEPARTMENT_OTHER): Payer: Medicare HMO

## 2025-03-18 ENCOUNTER — Ambulatory Visit: Admitting: Nurse Practitioner
